# Patient Record
Sex: Female | Born: 1935
Health system: Southern US, Community
[De-identification: ages and names within clinical notes are randomized; demographics above are authoritative.]

## PROBLEM LIST (undated history)

## (undated) DIAGNOSIS — K2100 Gastro-esophageal reflux disease with esophagitis, without bleeding: Secondary | ICD-10-CM

## (undated) DIAGNOSIS — E785 Hyperlipidemia, unspecified: Secondary | ICD-10-CM

## (undated) DIAGNOSIS — E1165 Type 2 diabetes mellitus with hyperglycemia: Secondary | ICD-10-CM

## (undated) DIAGNOSIS — H3530 Unspecified macular degeneration: Secondary | ICD-10-CM

## (undated) DIAGNOSIS — K219 Gastro-esophageal reflux disease without esophagitis: Secondary | ICD-10-CM

## (undated) DIAGNOSIS — I10 Essential (primary) hypertension: Secondary | ICD-10-CM

## (undated) DIAGNOSIS — M2550 Pain in unspecified joint: Secondary | ICD-10-CM

## (undated) DIAGNOSIS — L259 Unspecified contact dermatitis, unspecified cause: Secondary | ICD-10-CM

## (undated) DIAGNOSIS — R413 Other amnesia: Secondary | ICD-10-CM

## (undated) DIAGNOSIS — K589 Irritable bowel syndrome without diarrhea: Secondary | ICD-10-CM

## (undated) DIAGNOSIS — R5383 Other fatigue: Secondary | ICD-10-CM

## (undated) DIAGNOSIS — M8448XA Pathological fracture, other site, initial encounter for fracture: Secondary | ICD-10-CM

## (undated) DIAGNOSIS — H16229 Keratoconjunctivitis sicca, not specified as Sjogren's, unspecified eye: Secondary | ICD-10-CM

## (undated) DIAGNOSIS — E213 Hyperparathyroidism, unspecified: Secondary | ICD-10-CM

## (undated) DIAGNOSIS — M4120 Other idiopathic scoliosis, site unspecified: Secondary | ICD-10-CM

## (undated) DIAGNOSIS — R634 Abnormal weight loss: Secondary | ICD-10-CM

## (undated) DIAGNOSIS — G609 Hereditary and idiopathic neuropathy, unspecified: Secondary | ICD-10-CM

## (undated) DIAGNOSIS — H25019 Cortical age-related cataract, unspecified eye: Secondary | ICD-10-CM

## (undated) DIAGNOSIS — R5381 Other malaise: Secondary | ICD-10-CM

## (undated) DIAGNOSIS — M810 Age-related osteoporosis without current pathological fracture: Secondary | ICD-10-CM

## (undated) DIAGNOSIS — IMO0001 Reserved for inherently not codable concepts without codable children: Secondary | ICD-10-CM

## (undated) DIAGNOSIS — E559 Vitamin D deficiency, unspecified: Secondary | ICD-10-CM

## (undated) HISTORY — DX: Keratoconjunctivitis sicca, not specified as Sjogren's, unspecified eye: H16.229

## (undated) HISTORY — DX: Age-related osteoporosis without current pathological fracture: M81.0

## (undated) HISTORY — DX: Hyperlipidemia, unspecified: E78.5

## (undated) HISTORY — DX: Reserved for inherently not codable concepts without codable children: IMO0001

## (undated) HISTORY — DX: Type 2 diabetes mellitus with hyperglycemia: E11.65

## (undated) HISTORY — DX: Other fatigue: R53.83

## (undated) HISTORY — DX: Unspecified macular degeneration: H35.30

## (undated) HISTORY — DX: Vitamin D deficiency, unspecified: E55.9

## (undated) HISTORY — DX: Unspecified contact dermatitis, unspecified cause: L25.9

## (undated) HISTORY — DX: Irritable bowel syndrome, unspecified: K58.9

## (undated) HISTORY — DX: Pain in unspecified joint: M25.50

## (undated) HISTORY — DX: Other amnesia: R41.3

## (undated) HISTORY — PX: SPINE SURGERY: SHX786

## (undated) HISTORY — DX: Essential (primary) hypertension: I10

## (undated) HISTORY — DX: Gastro-esophageal reflux disease with esophagitis: K21.0

## (undated) HISTORY — DX: Gastro-esophageal reflux disease with esophagitis, without bleeding: K21.00

## (undated) HISTORY — DX: Other malaise: R53.81

## (undated) HISTORY — DX: Hyperparathyroidism, unspecified: E21.3

## (undated) HISTORY — DX: Other idiopathic scoliosis, site unspecified: M41.20

## (undated) HISTORY — DX: Hereditary and idiopathic neuropathy, unspecified: G60.9

## (undated) HISTORY — PX: APPENDECTOMY: SHX54

## (undated) HISTORY — DX: Pathological fracture, other site, initial encounter for fracture: M84.48XA

## (undated) HISTORY — DX: Abnormal weight loss: R63.4

## (undated) HISTORY — DX: Cortical age-related cataract, unspecified eye: H25.019

## (undated) HISTORY — DX: Gastro-esophageal reflux disease without esophagitis: K21.9

---
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
## 1976-06-30 HISTORY — PX: HEMORRHOID SURGERY: SHX153

## 1998-06-08 ENCOUNTER — Other Ambulatory Visit: Admission: RE | Admit: 1998-06-08 | Discharge: 1998-06-08 | Payer: Self-pay | Admitting: Obstetrics and Gynecology

## 1999-03-01 ENCOUNTER — Encounter: Payer: Self-pay | Admitting: Endocrinology

## 1999-03-01 ENCOUNTER — Ambulatory Visit (HOSPITAL_COMMUNITY): Admission: RE | Admit: 1999-03-01 | Discharge: 1999-03-01 | Payer: Self-pay | Admitting: Endocrinology

## 1999-06-03 ENCOUNTER — Other Ambulatory Visit: Admission: RE | Admit: 1999-06-03 | Discharge: 1999-06-03 | Payer: Self-pay | Admitting: Obstetrics and Gynecology

## 1999-06-03 ENCOUNTER — Encounter: Admission: RE | Admit: 1999-06-03 | Discharge: 1999-06-03 | Payer: Self-pay | Admitting: Obstetrics and Gynecology

## 1999-06-03 ENCOUNTER — Encounter: Payer: Self-pay | Admitting: Obstetrics and Gynecology

## 1999-07-01 HISTORY — PX: COLONOSCOPY: SHX174

## 1999-07-19 ENCOUNTER — Other Ambulatory Visit: Admission: RE | Admit: 1999-07-19 | Discharge: 1999-07-19 | Payer: Self-pay | Admitting: Obstetrics and Gynecology

## 1999-08-05 ENCOUNTER — Encounter: Admission: RE | Admit: 1999-08-05 | Discharge: 1999-11-03 | Payer: Self-pay | Admitting: Endocrinology

## 1999-08-08 ENCOUNTER — Encounter: Payer: Self-pay | Admitting: *Deleted

## 1999-08-08 ENCOUNTER — Ambulatory Visit (HOSPITAL_COMMUNITY): Admission: RE | Admit: 1999-08-08 | Discharge: 1999-08-08 | Payer: Self-pay | Admitting: *Deleted

## 1999-11-21 ENCOUNTER — Encounter: Payer: Self-pay | Admitting: Endocrinology

## 1999-11-21 ENCOUNTER — Encounter: Admission: RE | Admit: 1999-11-21 | Discharge: 1999-11-21 | Payer: Self-pay | Admitting: Endocrinology

## 2000-04-29 ENCOUNTER — Ambulatory Visit (HOSPITAL_COMMUNITY): Admission: RE | Admit: 2000-04-29 | Discharge: 2000-04-29 | Payer: Self-pay | Admitting: Gastroenterology

## 2000-06-01 ENCOUNTER — Other Ambulatory Visit: Admission: RE | Admit: 2000-06-01 | Discharge: 2000-06-01 | Payer: Self-pay | Admitting: Obstetrics and Gynecology

## 2000-11-23 ENCOUNTER — Encounter: Payer: Self-pay | Admitting: Internal Medicine

## 2000-11-23 ENCOUNTER — Encounter: Admission: RE | Admit: 2000-11-23 | Discharge: 2000-11-23 | Payer: Self-pay | Admitting: Internal Medicine

## 2001-06-08 ENCOUNTER — Other Ambulatory Visit: Admission: RE | Admit: 2001-06-08 | Discharge: 2001-06-08 | Payer: Self-pay | Admitting: Obstetrics and Gynecology

## 2001-09-08 ENCOUNTER — Encounter: Admission: RE | Admit: 2001-09-08 | Discharge: 2001-09-08 | Payer: Self-pay | Admitting: *Deleted

## 2001-10-27 ENCOUNTER — Encounter: Payer: Self-pay | Admitting: Neurosurgery

## 2001-11-02 ENCOUNTER — Inpatient Hospital Stay (HOSPITAL_COMMUNITY): Admission: RE | Admit: 2001-11-02 | Discharge: 2001-11-05 | Payer: Self-pay | Admitting: Neurosurgery

## 2001-11-02 ENCOUNTER — Encounter: Payer: Self-pay | Admitting: Neurosurgery

## 2001-11-02 HISTORY — PX: SPINE SURGERY: SHX786

## 2001-12-07 ENCOUNTER — Encounter: Payer: Self-pay | Admitting: Internal Medicine

## 2001-12-07 ENCOUNTER — Encounter: Admission: RE | Admit: 2001-12-07 | Discharge: 2001-12-07 | Payer: Self-pay | Admitting: Internal Medicine

## 2002-03-08 ENCOUNTER — Ambulatory Visit (HOSPITAL_COMMUNITY): Admission: RE | Admit: 2002-03-08 | Discharge: 2002-03-08 | Payer: Self-pay | Admitting: Neurosurgery

## 2002-03-08 ENCOUNTER — Encounter: Payer: Self-pay | Admitting: Neurosurgery

## 2002-06-09 ENCOUNTER — Other Ambulatory Visit: Admission: RE | Admit: 2002-06-09 | Discharge: 2002-06-09 | Payer: Self-pay | Admitting: Obstetrics and Gynecology

## 2002-06-30 HISTORY — PX: MEDIAL PARTIAL KNEE REPLACEMENT: SHX5965

## 2002-06-30 HISTORY — PX: CHOLECYSTECTOMY: SHX55

## 2002-12-09 ENCOUNTER — Encounter: Payer: Self-pay | Admitting: Internal Medicine

## 2002-12-09 ENCOUNTER — Encounter: Admission: RE | Admit: 2002-12-09 | Discharge: 2002-12-09 | Payer: Self-pay | Admitting: Internal Medicine

## 2003-03-15 ENCOUNTER — Encounter: Admission: RE | Admit: 2003-03-15 | Discharge: 2003-03-15 | Payer: Self-pay | Admitting: Internal Medicine

## 2003-03-15 ENCOUNTER — Encounter: Payer: Self-pay | Admitting: Internal Medicine

## 2003-03-23 ENCOUNTER — Encounter: Payer: Self-pay | Admitting: Internal Medicine

## 2003-03-23 ENCOUNTER — Encounter: Admission: RE | Admit: 2003-03-23 | Discharge: 2003-03-23 | Payer: Self-pay | Admitting: Internal Medicine

## 2003-04-24 ENCOUNTER — Encounter: Payer: Self-pay | Admitting: General Surgery

## 2003-04-26 ENCOUNTER — Encounter: Admission: RE | Admit: 2003-04-26 | Discharge: 2003-04-26 | Payer: Self-pay | Admitting: General Surgery

## 2003-04-27 ENCOUNTER — Encounter (INDEPENDENT_AMBULATORY_CARE_PROVIDER_SITE_OTHER): Payer: Self-pay | Admitting: *Deleted

## 2003-04-27 ENCOUNTER — Ambulatory Visit (HOSPITAL_COMMUNITY): Admission: RE | Admit: 2003-04-27 | Discharge: 2003-04-28 | Payer: Self-pay | Admitting: General Surgery

## 2003-05-15 ENCOUNTER — Inpatient Hospital Stay (HOSPITAL_COMMUNITY): Admission: RE | Admit: 2003-05-15 | Discharge: 2003-05-18 | Payer: Self-pay | Admitting: Orthopedic Surgery

## 2003-05-18 ENCOUNTER — Inpatient Hospital Stay (HOSPITAL_COMMUNITY)
Admission: RE | Admit: 2003-05-18 | Discharge: 2003-05-24 | Payer: Self-pay | Admitting: Physical Medicine & Rehabilitation

## 2003-06-09 ENCOUNTER — Other Ambulatory Visit: Admission: RE | Admit: 2003-06-09 | Discharge: 2003-06-09 | Payer: Self-pay | Admitting: Family Medicine

## 2003-07-03 ENCOUNTER — Encounter: Admission: RE | Admit: 2003-07-03 | Discharge: 2003-07-03 | Payer: Self-pay | Admitting: Family Medicine

## 2003-09-20 ENCOUNTER — Other Ambulatory Visit: Admission: RE | Admit: 2003-09-20 | Discharge: 2003-09-20 | Payer: Self-pay | Admitting: Diagnostic Radiology

## 2003-12-12 ENCOUNTER — Encounter: Admission: RE | Admit: 2003-12-12 | Discharge: 2003-12-12 | Payer: Self-pay | Admitting: Family Medicine

## 2004-11-06 ENCOUNTER — Encounter (HOSPITAL_COMMUNITY): Admission: RE | Admit: 2004-11-06 | Discharge: 2005-02-04 | Payer: Self-pay | Admitting: Surgery

## 2004-12-06 ENCOUNTER — Encounter: Admission: RE | Admit: 2004-12-06 | Discharge: 2004-12-06 | Payer: Self-pay | Admitting: Family Medicine

## 2004-12-17 ENCOUNTER — Encounter: Admission: RE | Admit: 2004-12-17 | Discharge: 2004-12-17 | Payer: Self-pay | Admitting: Family Medicine

## 2005-06-30 HISTORY — PX: THYROIDECTOMY, PARTIAL: SHX18

## 2005-12-02 ENCOUNTER — Ambulatory Visit (HOSPITAL_COMMUNITY): Admission: RE | Admit: 2005-12-02 | Discharge: 2005-12-02 | Payer: Self-pay | Admitting: Surgery

## 2005-12-16 ENCOUNTER — Other Ambulatory Visit: Admission: RE | Admit: 2005-12-16 | Discharge: 2005-12-16 | Payer: Self-pay | Admitting: Obstetrics & Gynecology

## 2005-12-18 ENCOUNTER — Encounter: Admission: RE | Admit: 2005-12-18 | Discharge: 2005-12-18 | Payer: Self-pay | Admitting: Family Medicine

## 2006-02-27 ENCOUNTER — Ambulatory Visit (HOSPITAL_COMMUNITY): Admission: RE | Admit: 2006-02-27 | Discharge: 2006-02-28 | Payer: Self-pay | Admitting: Surgery

## 2006-02-27 ENCOUNTER — Encounter (INDEPENDENT_AMBULATORY_CARE_PROVIDER_SITE_OTHER): Payer: Self-pay | Admitting: *Deleted

## 2006-10-05 ENCOUNTER — Encounter: Admission: RE | Admit: 2006-10-05 | Discharge: 2006-10-05 | Payer: Self-pay | Admitting: Family Medicine

## 2007-01-13 ENCOUNTER — Encounter: Admission: RE | Admit: 2007-01-13 | Discharge: 2007-01-13 | Payer: Self-pay | Admitting: Family Medicine

## 2007-07-01 HISTORY — PX: TOTAL KNEE ARTHROPLASTY: SHX125

## 2007-12-30 ENCOUNTER — Other Ambulatory Visit: Admission: RE | Admit: 2007-12-30 | Discharge: 2007-12-30 | Payer: Self-pay | Admitting: Obstetrics & Gynecology

## 2008-01-20 ENCOUNTER — Encounter: Admission: RE | Admit: 2008-01-20 | Discharge: 2008-01-20 | Payer: Self-pay | Admitting: Family Medicine

## 2008-01-25 ENCOUNTER — Inpatient Hospital Stay (HOSPITAL_COMMUNITY): Admission: RE | Admit: 2008-01-25 | Discharge: 2008-01-28 | Payer: Self-pay | Admitting: Orthopedic Surgery

## 2008-03-13 ENCOUNTER — Encounter: Admission: RE | Admit: 2008-03-13 | Discharge: 2008-03-13 | Payer: Self-pay | Admitting: Neurosurgery

## 2008-06-30 HISTORY — PX: KYPHOPLASTY: SHX5884

## 2008-07-17 ENCOUNTER — Other Ambulatory Visit: Payer: Self-pay | Admitting: Neurosurgery

## 2008-07-20 ENCOUNTER — Other Ambulatory Visit: Payer: Self-pay | Admitting: Neurosurgery

## 2008-07-24 ENCOUNTER — Other Ambulatory Visit: Payer: Self-pay | Admitting: Neurosurgery

## 2008-07-24 ENCOUNTER — Observation Stay (HOSPITAL_COMMUNITY): Admission: RE | Admit: 2008-07-24 | Discharge: 2008-07-25 | Payer: Self-pay | Admitting: Neurosurgery

## 2009-01-23 ENCOUNTER — Encounter: Admission: RE | Admit: 2009-01-23 | Discharge: 2009-01-23 | Payer: Self-pay | Admitting: Family Medicine

## 2010-01-15 ENCOUNTER — Encounter: Admission: RE | Admit: 2010-01-15 | Discharge: 2010-01-15 | Payer: Self-pay | Admitting: Family Medicine

## 2010-01-17 ENCOUNTER — Encounter: Admission: RE | Admit: 2010-01-17 | Discharge: 2010-01-17 | Payer: Self-pay | Admitting: Family Medicine

## 2010-01-23 ENCOUNTER — Encounter: Admission: RE | Admit: 2010-01-23 | Discharge: 2010-01-23 | Payer: Self-pay | Admitting: Obstetrics & Gynecology

## 2010-02-04 ENCOUNTER — Encounter: Admission: RE | Admit: 2010-02-04 | Discharge: 2010-02-04 | Payer: Self-pay | Admitting: Surgery

## 2010-02-05 ENCOUNTER — Ambulatory Visit (HOSPITAL_BASED_OUTPATIENT_CLINIC_OR_DEPARTMENT_OTHER): Admission: RE | Admit: 2010-02-05 | Discharge: 2010-02-05 | Payer: Self-pay | Admitting: Surgery

## 2010-02-05 ENCOUNTER — Encounter: Admission: RE | Admit: 2010-02-05 | Discharge: 2010-02-05 | Payer: Self-pay | Admitting: Surgery

## 2010-02-05 HISTORY — PX: BREAST LUMPECTOMY: SHX2

## 2010-03-26 DIAGNOSIS — E213 Hyperparathyroidism, unspecified: Secondary | ICD-10-CM

## 2010-03-26 HISTORY — DX: Hyperparathyroidism, unspecified: E21.3

## 2010-06-17 ENCOUNTER — Encounter
Admission: RE | Admit: 2010-06-17 | Discharge: 2010-06-17 | Payer: Self-pay | Source: Home / Self Care | Attending: Internal Medicine | Admitting: Internal Medicine

## 2010-06-17 DIAGNOSIS — R5381 Other malaise: Secondary | ICD-10-CM

## 2010-06-17 HISTORY — DX: Other malaise: R53.81

## 2010-09-13 LAB — DIFFERENTIAL
Basophils Absolute: 0 10*3/uL (ref 0.0–0.1)
Basophils Relative: 0 % (ref 0–1)
Eosinophils Absolute: 0.2 10*3/uL (ref 0.0–0.7)
Eosinophils Relative: 2 % (ref 0–5)
Lymphocytes Relative: 27 % (ref 12–46)
Lymphs Abs: 2.3 10*3/uL (ref 0.7–4.0)
Monocytes Absolute: 0.7 10*3/uL (ref 0.1–1.0)
Monocytes Relative: 8 % (ref 3–12)
Neutro Abs: 5.3 10*3/uL (ref 1.7–7.7)
Neutrophils Relative %: 63 % (ref 43–77)

## 2010-09-13 LAB — BASIC METABOLIC PANEL
BUN: 17 mg/dL (ref 6–23)
CO2: 26 mEq/L (ref 19–32)
Calcium: 11.2 mg/dL — ABNORMAL HIGH (ref 8.4–10.5)
Chloride: 103 mEq/L (ref 96–112)
Creatinine, Ser: 0.76 mg/dL (ref 0.4–1.2)
GFR calc Af Amer: >60 mL/min (ref >60)
GFR calc non Af Amer: >60 mL/min (ref >60)
Glucose, Bld: 193 mg/dL — ABNORMAL HIGH (ref 70–99)
Potassium: 4 mEq/L (ref 3.5–5.1)
Sodium: 138 mEq/L (ref 135–145)

## 2010-09-13 LAB — CBC
HCT: 38.5 % (ref 36.0–46.0)
Hemoglobin: 12.9 g/dL (ref 12.0–15.0)
MCH: 31.2 pg (ref 26.0–34.0)
MCHC: 33.5 g/dL (ref 30.0–36.0)
MCV: 93 fL (ref 78.0–100.0)
Platelets: 230 10*3/uL (ref 150–400)
RBC: 4.14 MIL/uL (ref 3.87–5.11)
RDW: 13.7 % (ref 11.5–15.5)
WBC: 8.4 10*3/uL (ref 4.0–10.5)

## 2010-09-13 LAB — GLUCOSE, CAPILLARY: Glucose-Capillary: 115 mg/dL — ABNORMAL HIGH (ref 70–99)

## 2010-10-14 LAB — CBC
HCT: 39.1 % (ref 36.0–46.0)
HCT: 39.3 % (ref 36.0–46.0)
Hemoglobin: 13 g/dL (ref 12.0–15.0)
Hemoglobin: 13.1 g/dL (ref 12.0–15.0)
MCHC: 33.1 g/dL (ref 30.0–36.0)
MCHC: 33.4 g/dL (ref 30.0–36.0)
MCV: 93.4 fL (ref 78.0–100.0)
MCV: 93.9 fL (ref 78.0–100.0)
Platelets: 249 10*3/uL (ref 150–400)
Platelets: 250 10*3/uL (ref 150–400)
RBC: 4.19 MIL/uL (ref 3.87–5.11)
RBC: 4.19 MIL/uL (ref 3.87–5.11)
RDW: 14.4 % (ref 11.5–15.5)
RDW: 14.8 % (ref 11.5–15.5)
WBC: 6.4 10*3/uL (ref 4.0–10.5)
WBC: 7.1 10*3/uL (ref 4.0–10.5)

## 2010-10-14 LAB — BASIC METABOLIC PANEL
BUN: 16 mg/dL (ref 6–23)
BUN: 16 mg/dL (ref 6–23)
CO2: 26 mEq/L (ref 19–32)
CO2: 29 mEq/L (ref 19–32)
Calcium: 11 mg/dL — ABNORMAL HIGH (ref 8.4–10.5)
Calcium: 11 mg/dL — ABNORMAL HIGH (ref 8.4–10.5)
Chloride: 100 mEq/L (ref 96–112)
Chloride: 102 mEq/L (ref 96–112)
Creatinine, Ser: 0.65 mg/dL (ref 0.4–1.2)
Creatinine, Ser: 0.65 mg/dL (ref 0.4–1.2)
GFR calc Af Amer: >60 mL/min (ref >60)
GFR calc Af Amer: >60 mL/min (ref >60)
GFR calc non Af Amer: >60 mL/min (ref >60)
GFR calc non Af Amer: >60 mL/min (ref >60)
Glucose, Bld: 129 mg/dL — ABNORMAL HIGH (ref 70–99)
Glucose, Bld: 150 mg/dL — ABNORMAL HIGH (ref 70–99)
Potassium: 4.1 mEq/L (ref 3.5–5.1)
Potassium: 4.5 mEq/L (ref 3.5–5.1)
Sodium: 135 mEq/L (ref 135–145)
Sodium: 135 mEq/L (ref 135–145)

## 2010-10-14 LAB — GLUCOSE, CAPILLARY
Glucose-Capillary: 104 mg/dL — ABNORMAL HIGH (ref 70–99)
Glucose-Capillary: 119 mg/dL — ABNORMAL HIGH (ref 70–99)
Glucose-Capillary: 140 mg/dL — ABNORMAL HIGH (ref 70–99)
Glucose-Capillary: 152 mg/dL — ABNORMAL HIGH (ref 70–99)
Glucose-Capillary: 172 mg/dL — ABNORMAL HIGH (ref 70–99)
Glucose-Capillary: 72 mg/dL (ref 70–99)

## 2010-11-12 NOTE — Op Note (Signed)
NAMESHAINDEL, SWEETEN                ACCOUNT NO.:  [id number]   MEDICAL RECORD NO.:  [id number]          PATIENT TYPE:  INP   LOCATION:  0008                         FACILITY:  [facility]   PHYSICIAN:  Madlyn Frankel. Charlann Boxer, M.D.  DATE OF BIRTH:  1925/08/18   DATE OF PROCEDURE:  01/25/2008  DATE OF DISCHARGE:                               OPERATIVE REPORT   PREOPERATIVE DIAGNOSIS:  Right knee osteoarthritis.   POSTOPERATIVE DIAGNOSIS:  Right knee osteoarthritis.   PROCEDURE:  Right total knee replacement.   COMPONENTS USED:  Rotating platform posterior stabilized knee system,  2.5 femur, 2.5 tibia, 10 mm insert and 35 patella button.   SURGEON:  Madlyn Frankel. Charlann Boxer, M.D.   ASSISTANT:  Surgical tech.   ANESTHESIA:  Duramorph spinal.   BLOOD LOSS:  Minimal.   TOURNIQUET TIME:  40 minutes at 250 mmHg.   DRAIN:  One.   INDICATIONS FOR PROCEDURE:  Ms. Hamelin is an 77 year old female self-  referred for evaluation of right knee anterolateral left uni performed  in the past.  She did have progressive discomfort with bone on bone  articulation on the lateral compartment of the knee.  Given the fact  that she failed conservative measures, she wished to go ahead and  proceed with arthroplasty versus unicompartmental.  Risks and benefits  were discussed including potential displeasure of the procedure,  infection, DVT, failure and need for revision.  Hopefully at age 85, we  will get her knee that she will enjoy.  We discussed the postoperative  course, expectations of therapy required, consent was obtained.   DESCRIPTION OF PROCEDURE:  Patient was brought to the operating theater.  Once adequate anesthesia and preoperative antibiotics, Ancef,  administered, patient was positioned supine with a thigh tourniquet  placed.  Right lower extremity was prescrubbed, prepped and draped in  sterile fashion.  The leg was exsanguinated, tourniquet elevated to 250  mmHg.  Midline incision was made followed  by a median arthrotomy.  Following initial debridement, attention was directed to the patella.  Precut measurement was 22 mm, I then resected to 14 mm and chose a 35  patella button.  I placed the metal shim to protect the cut surface.  It  was immediately evident on this first cut that her bone was very  osteoporotic.   Following further debridement, attention was directed to the femur.  Femoral canal was opened and drilled. irrigated to prevent fat emboli.  I placed an intramedullary rod and due to her short stature, I chose 3  degree valgus cut of 10 mm of bone off the distal femur. Despite the  valgus nature of her knee, no augements were necessary.   I sized the femur to be a size 2.5.  At this point, I attended to the  tibia and subluxated anteriorly.  Following exposure and meniscectomy, I  used an extramedullary guide and dissected 4 mm of bone off the medial  side of the tibia.  I checked with the  extension gap and found that the  knee came under full extension.   I checked the cut  surface and found that the cut was perpendicular in  both planes.   At this point, we checked the rotation of the femoral component using  the cut surface of the proximal tibia.  Once the rotation was set and  the pins were in place, I used a 2.5 4-in-1 cutting block and positioned  it and found that it was perpendicular to Whiteside's line.   These four cuts were made followed by the lateral box cut off the distal  femur.  I now returned to the tibia, performing further debridements as  necessary.  The 2.5 tibial probe was sat onto the cut surface of the  proximal tibial.  The tray was pinned in position, drilled and keel  punched.  Trial reduction was carried out with the 2.5 femur, 2.5 tibia  and 10 mm insert.  The knee came out to full extension.  The ligaments  appeared to be very stable in extension through flexion.  No evidence of  any significant laxity medially and the lateral appeared  to be released  adequately.  Patella tracked without application of thumb pressure.   At this point, all trial components were removed.  We injected the  synovial capsule layer with 2 mL of 25% Marcaine with epinephrine and 1  mL of Toradol.  The knee was irrigated with pulse lavage.  Cement was  mixed as final components were opened.  Components were cemented into  position and the knee was brought out to extension with a 10 mm insert.  Extruded cement was removed.  Once the cement cured, excessive cement  was removed throughout the knee.  Satisfied I was unable to visualize  any other cement pieces, I placed a final 10 mm poly to match the 2.5  femur.  The knee was reirrigated with normal saline solution, the  tourniquet was let down at 40 minutes.  A medium Hemovac drain was  placed deep.  The extensor mechanism was then reapproximated using #1  Vicryl with the knee in flexion.  The remainder of the wound with 2-0  Vicryl and running 4-0 Monocryl.  The knee was cleaned, dried and  dressed sterilely with Steri-Strips and sterile bulky wrap.  She was  brought to the recovery room in stable condition.  Tolerated the  procedure well.      Madlyn Frankel Charlann Boxer, M.D.  Electronically Signed     MDO/MEDQ  D:  01/25/2008  T:  01/25/2008  Job:  [id number]

## 2010-11-12 NOTE — Op Note (Signed)
NAMEMALIE, KASHANI                ACCOUNT NO.:  [id number]   MEDICAL RECORD NO.:  [id number]          PATIENT TYPE:  INP   LOCATION:  3533                         FACILITY:  [facility]   PHYSICIAN:  Danae Orleans. Venetia Maxon, M.D.  DATE OF BIRTH:  [birth year]   DATE OF PROCEDURE:  07/24/2008  DATE OF DISCHARGE:                               OPERATIVE REPORT   PREOPERATIVE DIAGNOSIS:  T10 and T12 compression fractures.   POSTOPERATIVE DIAGNOSIS:  T10 and T12 compression fractures.   PROCEDURE:  T10 and T12 kyphoplasty.   SURGEON:  Danae Orleans. Venetia Maxon, MD   ANESTHESIA:  General endotracheal anesthesia.   ESTIMATED BLOOD LOSS:  Minimal.   COMPLICATIONS:  None.   DISPOSITION:  Recovery.   INDICATIONS:  Ms. Mountjoy is an 77 year old  woman with T10 and T12  compression fractures with osteoporosis.  It was elected after  protracted conservative management without resolution of her pain for  her to undergo T10 and T12 kyphoplasty procedures.  She has significant  scoliosis as well as degenerative spinal spondylosis.   PROCEDURE:  Ms. Laguna was brought to the operating room.  Following a  satisfactory and uncomplicated induction of general endotracheal  anesthesia and placement of intravenous lines, the patient was turned in  prone position on fluoroscopic table with chest and pelvic rolls.  Her  back was prepped and draped in usual sterile fashion with a DuraPrep  after AP and lateral C-arm fluoroscopy was obtained defining the T10 and  T12 vertebral bodies and maintaining spinous processes in the midline.  Initially on the left of T12, a transpedicular approach was performed  with an introducer after infiltrating skin and subcutaneous tissues with  local lidocaine.  A 20 mL balloon was filled but did not fill across the  midline.  It was, therefore, elected to place an additional balloon on  the right side and with bilateral filling, there was good restoration of  vertebral body height and of  the superior endplate of T12.  Attention  was then turned to the T10 level where from the left transpedicular  approach an inflatable bone tamp was inserted and with good filling  across the midline, the bone cement was then mixed and using C-arm AP  and lateral fluoroscopy, 3 fills were placed at the T12 level and 3  fills of the T10 level with good filling across the vertebra and  without evidence of extravasation.  The introducers were removed.  The  incisions were closed with 3-0 Vicryl stitches.  Wounds were dressed  with Dermabond.  The patient was extubated in the operating room, taken  to the recovery in stable satisfactory condition, having tolerated the  operation well.  Counts correct at the end of the case.      Danae Orleans. Venetia Maxon, M.D.  Electronically Signed     JDS/MEDQ  D:  07/24/2008  T:  07/25/2008  Job:  [id number]

## 2010-11-12 NOTE — H&P (Signed)
[name], [name]                ACCOUNT NO.:  [id number]   MEDICAL RECORD NO.:  [id number]          PATIENT TYPE:  INP   LOCATION:                               FACILITY:  [facility]   PHYSICIAN:  Madlyn Frankel. Charlann Boxer, M.D.  DATE OF BIRTH:  09-22-1925   DATE OF ADMISSION:  01/25/2008  DATE OF DISCHARGE:                              HISTORY & PHYSICAL   PROCEDURE:  Right total knee arthroplasty.   CHIEF COMPLAINTS:  Right knee pain.   HISTORY OF PRESENT ILLNESS:  An 77 year old female with a history of  right knee pain secondary to osteoarthritis.  It has been refractory to  all conservative treatment, including oral antiinflammatories and  cortisone injection.  She also has a significant history of a left  lateral partial knee replacement done back in 2004.  This has given her  some persistent problems.  She has been very motivated for this right  total knee replacement which is pending, and she has even done preop  physical therapy to help with strengthening and stretching exercises.   PAST MEDICAL HISTORY:  1. Osteoarthritis.  2. Diabetes.  3. Hypertension.   PAST SURGICAL HISTORY:  1. Fibroidectomy in the 50s.  2. Cholecystectomy.  3. Left partial knee replacement.  4. Partial thyroidectomy.  5. Hemorrhoidectomy.   FAMILY HISTORY:  Coronary artery disease, stroke.   SOCIAL HISTORY:  Widowed.  She is retired.  She is planning on a skilled  nursing facility rehab hopefully at [facility] postoperatively.   DRUG ALLERGIES:  1. SULFA DRUGS.  2. She is also allergic to NUTMEG.   CURRENT MEDICATIONS:  1. Actos 15 mg p.o. daily.  2. Benazepril 10 mg p.o. daily.  3. Glipizide ER 5 mg p.o. daily.  4. Fish oil daily.  5. Multivitamin daily.  6. Vitamin D 1000 units p.o. daily.   REVIEW OF SYSTEMS:  MUSCULOSKELETAL:  She does have back pain and  morning stiffness of her back.  Otherwise, see HPI.   PHYSICAL EXAMINATION:  VITAL SIGNS:  Pulse 72, respirations 16, blood  pressure  122/66.  Height 5 feet 4 inches, 122 pounds.  GENERAL:  Awake, alert, and oriented, well developed, well nourished, in  no acute distress.  NECK:  Supple.  No carotid bruits.  CHEST:  The lungs are clear to auscultation bilaterally.  BREASTS:  Deferred.  HEART:  Regular rate and rhythm.  S1-S2 distinct.  ABDOMEN:  Soft, nontender, bowel sounds present.  GENITOURINARY:  Deferred.  EXTREMITIES:  Right knee has crepitation with range of motion.  She can  flex fully.  SKIN:  No signs of cellulitis.  NEUROLOGIC:  Intact distal sensibilities.   Labs, EKG, and chest x-ray all pending presurgical testing.   IMPRESSION:  Right knee osteoarthritis.   PLAN OF ACTION:  Right total knee arthroplasty by surgeon Dr. Durene Romans at [facility], January 25, 2008.  Risks and complications  were discussed.   The patient is planning skilled nursing facility rehab.  Would prefer  [facility] if at all possible.     ______________________________  [name]. [name]  Madlyn Frankel Charlann Boxer, M.D.  Electronically Signed    BLM/MEDQ  D:  01/12/2008  T:  01/12/2008  Job:  [id number]   cc:   Talmadge Coventry, M.D.  Fax: [phone]   Dorisann Frames, M.D.   Dorisann Frames, M.D.

## 2010-11-12 NOTE — Discharge Summary (Signed)
[name], [name]                ACCOUNT NO.:  [id number]   MEDICAL RECORD NO.:  [id number]          PATIENT TYPE:  INP   LOCATION:  1603                         FACILITY:  [facility]   PHYSICIAN:  Madlyn Frankel. Charlann Boxer, M.D.  DATE OF BIRTH:  02-14-1926   DATE OF ADMISSION:  01/25/2008  DATE OF DISCHARGE:  01/28/2008                               DISCHARGE SUMMARY   PRINCIPAL DIAGNOSIS:  Right knee osteoarthritis.   SECONDARY DIAGNOSES:  1. Include osteoarthritis.  2. Diabetes.  3. Hypertension.  4. History of a left partial knee replacement.   BRIEF HISTORY:  The patient is an 77 year old female who presented to  the office for evaluation of advancing right knee osteoarthritis  predominately in the lateral and anterior aspects of the knee.  She did  not wish to proceed with a partial knee replacement.  Instead, wished to  proceed with total knee replacement.  Risks and benefits were discussed.  Surgery was set up for same-day surgery on January 25, 2008.  Please see  the history and physical for details of her medical history, past  surgical history, family and social history.   CURRENT MEDICATIONS:  Also listed in that area.   HOSPITAL COURSE:  The patient was set up for same-day surgery on January 25, 2008.  She underwent, at that time, an uncomplicated right total  knee replacement.  Please see dictated operative note for details of the  operative procedure including operative time, tourniquet time, and  components used.  She was transferred to the orthopedic ward  postoperatively after this knee replacement surgery from the recovery  room without difficulty or issue.   Her basic hospital course was entirely uncomplicated.  She did extremely  well while she was in the hospital without any events.  She did not  require transfusion.  Her last hematocrit was 25.5 noted on postop day  #2.  She is currently on iron.  She was not weak, did not have any dizzy  spells, and had no tachycardia  or chest pains.   She was seen and evaluated by physical therapy and worked with CPM while  in the hospital to improve her overall range of motion strength.   By the time of discharge, she was still working with therapy and using a  knee immobilizer.  She was unable to perform straight leg raising due to  quad weakness.   DISCHARGE INSTRUCTIONS:  She is to be discharged on January 28, 2008 to a  skilled nursing facility, to do rehab once to twice a day.  She is  instructed to wear a knee immobilizer only as needed for weakness in the  leg with walking.  Otherwise, she should use a walker for balance.  She  should work on strengthening and extension of her right lower extremity  as well as the motion.   WOUND EVALUATION:  She should have a dry dressing on her right knee for  2 weeks and her Steri-Strips can room be removed in 2 weeks.  She can  shower, just keep her wound as dry as possible for  the next 7 days.   DISCHARGE FOLLOWUP:  She will return to see [place] at [facility]  at [phone] in 2 weeks.   DISCHARGE MEDICATIONS:  1. Will include her home medicine of Actos 15 mg p.o. daily.  2. Benazepril 10 mg p.o. daily.  3. Glipizide ER 5 mg daily.  4. Fish oil.  5. Multivitamins.  6. Vitamin D 1000 units daily.  7. In addition, she will utilize Norco 5 mg 7.5 mg tablets 1 to 2      tablets p.o. q.4 to 6 hours p.o. for pain.  8. Robaxin 500 mg p.o. q.6 hours p.r.n. pain and muscle spasms.  9. Reglan 10 mg p.o. q.8 hours p.r.n. nausea.  10.She will take the Lovenox injections 40 mg subcu within her abdomen      for the next 11 days.  This should stop on January 29, 2008.  11.After that she will start on aspirin 325 mg p.o. daily.  12.Artificial tears can be used for dry eyes.   DISCHARGE DIAGNOSIS:  1. Right knee osteoarthritis.  2. History of osteoarthritis.  3. History of diabetes.  4. History of hypertension.  5. History of osteopenia.      Madlyn Frankel Charlann Boxer,  M.D.  Electronically Signed     MDO/MEDQ  D:  01/28/2008  T:  01/28/2008  Job:  [id number]

## 2010-11-15 NOTE — Op Note (Signed)
[place]  Patient:    [name], [name] Visit Number: [id number] MRN: [id number]          Service Type: SUR Location: 3000 [id number] Attending Physician:  Emeterio Reeve Dictated by:   Payton Doughty, M.D. Proc. Date: 11/02/01 Admit Date:  11/02/2001                             Operative Report  PREOPERATIVE DIAGNOSIS:  Spondylosis with myelopathy at C4-5 and C5-6.  POSTOPERATIVE DIAGNOSIS:  Spondylosis with myelopathy at C4-5 and C5-6.  PROCEDURE:  C4-5, C5-6 anterior cervical diskectomy and fusion with Tether plate.  SURGEON:  Payton Doughty, M.D.  NURSE ASSISTANT:  [facility].  DOCTOR ASSISTANT:  Danae Orleans. Venetia Maxon, M.D.  ANESTHESIA:  General endotracheal.  PREPARATION:  Sterile Betadine prep and scrub with alcohol wipe.  DESCRIPTION OF PROCEDURE:  A 77 year old right-handed white lady with severe cervical spondylitic myelopathy at C4-5 and C5-6.  Taken to the operating room and smoothly anesthetized and intubated, placed supine on the operating table. Following shave, prep, and drape in the usual sterile fashion, the skin was incised in the midline in the medial border of the sternocleidomastoid muscle on the left side.  The platysma was identified, elevated, divided, and undermined.  The sternocleidomastoid was identified.  Medial dissection revealed the carotid artery retracted laterally to the left, trachea and esophagus retracted laterally to the right, exposing the bones of the anterior cervical spine.  A marker was placed, intraoperative x-ray obtained to confirm correctness of the level.  Having confirmed correctness of the level, the longus colli was taken down bilaterally and the Shadow Line retractor placed. Diskectomy was carried out at 4-5 and C5-6, first under gross observation, then the operating microscope was brought in and microdissection technique used to complete the decompression at 4-5 and 5-6.  At each level but  mostly at 4-5 there was severe degenerative disk disease, bone-on-bone contact, and large osteophytes projecting posteriorly into the spinal canal as well as into the neural foramen.  These were removed without difficulty.  C4-5 was mostly degenerative disk disease with posteriorly protruding disk material. Following complete decompression of both foramina at both levels, 7 mm bone grafts were fashioned from patellar allograft and tapped into place.  A Tether plate was then placed, two screws in C4, one in C5, and two in C6. Intraoperative x-ray showed good placement of bone grafts, plate, and screws. The wound was irrigated and hemostasis assured.  The platysma was reapproximated with 3-0 Vicryl in interrupted fashion, subcutaneous tissue was reapproximated with 3-0 Vicryl in interrupted fashion, and the skin was closed with 4-0 Vicryl in a running subcuticular fashion.  Benzoin and Steri-Strips were placed and made occlusive with Telfa and OpSite.  The patient then returned to the recovery room in good condition. COMPLICATIONS:  None. Dictated by:   Payton Doughty, M.D. Attending Physician:  Emeterio Reeve DD:  11/02/01 TD:  11/04/01 Job: [id number]

## 2010-11-15 NOTE — Op Note (Signed)
[name], [name]                ACCOUNT NO.:  [id number]   MEDICAL RECORD NO.:  [id number]          PATIENT TYPE:  OIB   LOCATION:  1605                         FACILITY:  [facility]   PHYSICIAN:  Velora Heckler, MD      DATE OF BIRTH:  1925-10-03   DATE OF PROCEDURE:  02/27/2006  DATE OF DISCHARGE:                                 OPERATIVE REPORT   PREOPERATIVE DIAGNOSES:  1. Primary hyperparathyroidism.  2. Left thyroid nodule.   POSTOPERATIVE DIAGNOSES:  1. Primary hyperparathyroidism.  2. Left thyroid nodule.   PROCEDURE:  Left thyroid lobectomy and neck exploration.   SURGEON:  Velora Heckler, MD, FACS   ASSISTANT:  Wilmon Arms. Corliss Skains, MD, FACS   ANESTHESIA:  General per Dr. Ronelle Nigh.   ESTIMATED BLOOD LOSS:  Minimal.   PREPARATION:  Betadine.   COMPLICATIONS:  None.   INDICATIONS:  The patient is a 77 year old white female followed in my  practice for the past 2 years.  She has had elevated serum calcium levels  ranging from 10.6-10.8.  Her intact parathyroid hormone level is elevated at  91.  Sestamibi scan localized an area of increased uptake to the left  inferior position.  Thyroid ultrasound showed a heterogeneous nodule in the  left thyroid lobe.  The patient now comes to surgery for neck exploration  and parathyroidectomy.   DESCRIPTION OF PROCEDURE:  The procedure was done in OR #3 at the [facility].  The patient is brought to the operating room,  placed in a supine position on the operating room table.  Following  administration of general anesthesia, the patient is positioned and then  prepped and draped in the usual strict aseptic fashion.  After ascertaining  that an adequate level of anesthesia had been obtained, a left inferior neck  incision was made with a #15 blade.  Dissection was carried down through  subcutaneous the subcutaneous tissues and platysma, hemostasis was obtained  with the electrocautery.  Skin flaps were  developed.  There is some scarring  from her previous anterior cervical fusion on the left.  A Weitlaner  retractor was placed for exposure.  Strap muscles are incised in the midline  and reflected laterally.  The left thyroid lobe is exposed.  The left  thyroid lobe is moderately enlarged.  It is multinodular.  There is  significant scarring from her previous surgical procedure.  With gentle  dissection, the left inferior pole is exposed.  Dissection was carried down  the tracheoesophageal groove.  The left lobe is mobilized anteriorly and the  tracheoesophageal groove is explored.  There is no obvious evidence of  parathyroid adenoma.  The thyroid thymic tract is opened and explored into  the anterior mediastinum.  Again no sign of adenoma is found.  The left  thyroid lobe is multinodular. The incision is extended the entire left lobe  is exposed.  It is mobilized.  It is rolled anteriorly.  No sign of adenoma  is identified posterior to the left thyroid lobe.  Given the abnormality of  the left  thyroid lobe and its enlarged size and multiple nodules, a decision  is made to proceed with left thyroid lobectomy.  The superior pole vessels  are ligated in continuity between 2-0 silk ties and medium Ligaclips and  divided.  The gland is rolled anteriorly.  Branches of the inferior thyroid  artery are divided between small and medium Ligaclips.  The inferior venous  tributaries are divided between small and medium Ligaclips.  The Gland is  rolled anteriorly. A superior parathyroid gland is identified.  It is  normal.  It was maintained on its vascular pedicle and preserved.  The recurrent nerve actually has two main trunks both of which are  preserved.  The ligament of Allyson Sabal is transected with the electrocautery and  the gland is rolled anteriorly.  The gland is mobilized across the midline  and the isthmus is transected between hemostats and suture ligated with 3-0  Vicryl suture ligatures.   The left thyroid lobe is sectioned on the table.  It contains multiple nodules.  No obvious parathyroid tissue is identified.  It is submitted to pathology with a suture marking the inferior pole.  Dr.  Guerry Bruin did do frozen section on this region of the gland but saw only  findings consistent with adenomatous nodule.  The gland will be put in for  multiple sections to rule out an intrathyroidal parathyroid gland.  The left  neck is irrigated.  Good hemostasis is achieved.  Further dissection in the  retroesophageal space reveals no evidence of adenoma.  At this point, a  decision was made to discontinue the exploration.  A decision is made not to  proceed with exploration of the right side until final pathology results and  biochemical results were available based on the surgery already performed on  the left.  Therefore the wound was irrigated and evacuated.  Good hemostasis  is noted.  Surgicel was placed in the tracheoesophageal groove and over the  recurrent nerve and superior parathyroid gland.  Strap muscles were  reapproximated in the midline with interrupted 3-0 Vicryl sutures. The  platysma was closed with interrupted 3-0 Vicryl sutures.  The skin is closed  with a running 4-0 Vicryl subcuticular suture.  The wound is washed and  dried and Benzoin and Steri-Strips are applied.  Sterile dressings are  applied.  The patient is awakened from anesthesia and brought to the  recovery room in stable condition.  The patient tolerated the procedure  well.      Velora Heckler, MD  Electronically Signed     TMG/MEDQ  D:  02/27/2006  T:  02/27/2006  Job:  [id number]   cc:   Dorisann Frames, M.D.  Fax: [phone]   Talmadge Coventry, M.D.  Fax: [phone]

## 2010-11-15 NOTE — Discharge Summary (Signed)
NAME:  [name], [name]                          ACCOUNT NO.:  [id number]   MEDICAL RECORD NO.:  [id number]                   PATIENT TYPE:  INP   LOCATION:  5023                                 FACILITY:  [facility]   PHYSICIAN:  Mila Homer. Sherlean Foot, M.D.              DATE OF BIRTH:  08-02-1925   DATE OF ADMISSION:  05/15/2003  DATE OF DISCHARGE:  05/18/2003                                 DISCHARGE SUMMARY   ADMISSION DIAGNOSES:  1. Osteoarthritis, lateral compartment bilateral knees, left worse than     right.  2. Type 2 diabetes mellitus.  3. Hypertension.  4. Diverticulosis.  5. Hyperparathyroidism.   DISCHARGE DIAGNOSES:  1. Osteoarthritis, lateral compartment bilateral knees, status post left     unicompartmental knee arthroplasty.  2. Acute blood loss anemia secondary to surgery.  3. Constipation.  4. Type 2 diabetes mellitus.  5. Hypertension.  6. Diverticulosis.  7. Hyperparathyroidism.   SURGICAL PROCEDURE:  On May 15, 2003, [name] underwent a left  unicompartmental knee arthroplasty of the lateral compartment of the knee by  Dr. Mila Homer. Howard, assisted by Oneida Alar, P.A.-C.  She had a Miller-  Galante precoated, unicompartmental knee, tibial plate, left lateral series  4 placed, medial lateral 32 mm by anterior posterior 52 mm.  A femoral  component AP 47 mm right medial left lateral regular.  A Miller-Galante ME  tibial articular surface, series 4, 10 mm thickness.   COMPLICATIONS:  None.   CONSULTATIONS:  1. Case management and rehab medicine and physical therapy consult on     May 16, 2003.  2. Occupational therapy consult on May 17, 2003.   HISTORY OF PRESENT ILLNESS:  This 77 year old white female patient presented  to Dr. Sherlean Foot with left knee pain for the last five years.  The pain is  constant and present with walking, getting up and down, or bending over to  pick up objects.  It is a severe aggravating pain, and she has some  mechanical  symptoms with it.  She has failed conservative treatment.  Because of that, she is presenting for a left unicompartmental arthroplasty.   HOSPITAL COURSE:  [name] tolerated her surgical procedure well without  immediate postoperative complications.  She was subsequently transferred to  5000.  On postoperative day #1, she was afebrile, vitals were stable.  Hemoglobin was 11.4, hematocrit 33.8.  Left knee dressing was intact without  drainage and leg was neurovascularly intact.  She did have some calf pain  and it was monitored.  She was continued on therapy and plans were made for  hopefully rehab at discharge due to she lives by herself and does not have  any help.   On postoperative day #2, she continued to have calf pain so a Doppler was  obtained which was negative for deep vein thrombosis.  Left knee incision  was well-approximated with staples and no drainage.  Hemoglobin was 11.6,  hematocrit 34.4.  A Doppler was obtained and then she was continued on  therapy.   On postoperative day #3, she continues to complain of calf pain, but  otherwise pain is well controlled.  Temperature max is 100.4, vitals are  stable.  Hemoglobin 10.3, hematocrit 29.5.  She is ready for transfer to  rehab today and will be transferred there later today.   DIET:  Continue her current hospitalization diet.   DISCHARGE MEDICATIONS:  Continue her current hospitalization medications  with adjustments to be made per rehab physicians.  This includes:  1. Sliding scale insulin.  2. Colace 100 mg p.o. b.i.d.  3. Trinsicon one p.o. t.i.d.  4. Lovenox 30 subcu q.12h.  5. Actos 30 mg p.o. q. day.  6. Lotensin 10 mg p.o. q. day.  7. Glucotrol XL 2.5 mg p.o. q. day.  8. Multivitamin one tablet p.o. q. day.  9. Protonix 40 mg p.o. q. day.  10.      OxyContin 10 mg p.o. q.12h.  11.      Senokot S p.r.n.  12.      Laxative of choice and enema of choice as needed.  13.      Percocet one to two p.o. q.4h.  p.r.n. for pain.  14.      Phenergan 25 mg p.o. IV or p.r. q.6h. p.r.n. nausea.  15.      Tylenol p.r.n. for pain.  16.      Robaxin 500 mg one to two p.o. q.6h. p.r.n. spasms.  17.      Restoril 30 mg p.o. q.h.s. p.r.n. insomnia.   ACTIVITY:  She is to be out of bed weightbearing as tolerated on the left  leg with the use of a walker.  She is to continue PT and OT per rehab  protocol.   WOUND CARE:  Please keep the left knee incision clean and dry with Betadine  once a day and apply dry dressing.  Staples can be removed with Steri-Strips  with Benzoin applied on postoperative day #14.  This can be done in rehab if  she is still there, otherwise, she needs to follow up with Dr. Sherlean Foot at that  time.  She is to notify Dr. Sherlean Foot of temperature greater than 101.5, chills,  pain unrelieved by pain medications, or foul-smelling drainage from the  wound.   FOLLOWUP:  She needs to follow up with Dr. Sherlean Foot in our office on about  postoperative day #14 if her staples are still in place, otherwise, she can  follow up with him about a week after discharge from rehab.  She needs to  call [phone] for that appointment.   LABORATORY DATA:  Chest x-ray done on April 24, 2003, showed chronic lung  changes and an 8 to 9 mm left upper lobe nodule, and a CT was recommended  for further assessment.  CT scan with contrast done on April 26, 2003,  showed no left upper lobe nodule seen, minimal scarring in the apices, no  adenopathy.  She has a probable thyroid adenoma in the left lobe of the  thyroid near the isthmus, and thyroid ultrasound is recommended if necessary  to assess it further.  She has multiple hepatic cysts.   On May 16, 2003, hemoglobin 11.4, hematocrit 33.8.  On May 17, 2003, hemoglobin 11.6, hematocrit  34.4, and on November 81, 2004,  hemoglobin 10.3, hematocrit 29.5, white count 7, and platelets 190.  On May 10, 2003, glucose 123, calcium 11.5.  On May 16, 2003,  sodium 130, potassium 3.5, glucose 124, calcium 9.9.  On May 17, 2003,  sodium 135, potassium 3.9, chloride 102, CO2 29, BUN 6, creatinine 0.6, and  glucose 116.  All other laboratory studies were within normal limits.      Legrand Pitts Duffy, P.A.                      Mila Homer. Sherlean Foot, M.D.    KED/MEDQ  D:  05/18/2003  T:  05/19/2003  Job:  [id number]   cc:   Talmadge Coventry, M.D.  [address]  [place]  [place] [address]  Fax: [phone]

## 2010-11-15 NOTE — Procedures (Signed)
[facility]  Patient:    [name], [name]                         MRN: [id number] Proc. Date: 04/29/00 Adm. Date:  [id number] Attending:  Charna Elizabeth CC:         Jenel Lucks, M.D.   Procedure Report  DATE OF BIRTH:  [birth year].  PROCEDURE:  Colonoscopy.  ENDOSCOPIST:  Anselmo Rod, M.D.  INSTRUMENTS USED:  Olympus video colonoscope.  INDICATIONS:  Screening colonoscopy being performed on a 77 year old white female.  Rule out colonic polyps, masses, hemorrhoids, etc.  INFORMED CONSENT:  Informed consent was procured from the patient.  The patient was fasted for 8 hours prior to the procedure and prepped with a bottle of magnesium citrate and a gallon of nulytely the night prior to the procedure.  PREPROCEDURE PHYSICAL EXAMINATION:  VITAL SIGNS:  The patient had stable vital signs.  NECK:  Supple.  CHEST:  Clear to auscultation.  S1, S2 regular.  ABDOMEN:  Soft with normal abdominal bowel sounds.  DESCRIPTION OF PROCEDURE:  The patient was placed in the left lateral decubitus position and sedated with 50 mg of Demerol and 4 mg of Versed intravenously.  Once the patient was adequately sedated and maintained on low flow oxygen, and continuous cardiac monitoring, the Olympus video colonoscope was advanced from the rectum to the cecum without difficulty except for a pan diverticular disease with several large mild diverticula throughout the colon with more prominent changes in the left colon.  No masses, polyps, erosions or ulcerations were seen.  There was inspecific stool in some of the diverticular pockets.  All external hemorrhoids were appreciated on inspection.  No other abnormalities were seen up to the cecum and terminal ileum appeared healthy.  IMPRESSION: 1. Pan diverticular disease with more prominent changes in the left colon. 2. Small external hemorrhoids.  RECOMMENDATIONS: 1. The patient has been advised to increase  her fluid and fiber in the diet. 2. Information on diverticular disease and diverticulitis has been handed to    her for education. 3. Outpatient follow up is advised on a p.r.n. basis. 4. She has been advised to followup with Dr. Jenel Lucks and to contact me for    further GI problems. DD:  04/29/00 TD:  04/29/00 Job: [id number]

## 2010-11-15 NOTE — Op Note (Signed)
NAME:  [name], [name]                          ACCOUNT NO.:  [id number]   MEDICAL RECORD NO.:  [id number]                   PATIENT TYPE:  OIB   LOCATION:  2895                                 FACILITY:  [facility]   PHYSICIAN:  Adolph Pollack, M.D.            DATE OF BIRTH:  11-Jan-1926   DATE OF PROCEDURE:  04/27/2003  DATE OF DISCHARGE:                                 OPERATIVE REPORT   PREOPERATIVE DIAGNOSIS:  Symptomatic cholelithiasis.   POSTOPERATIVE DIAGNOSIS:  Symptomatic cholelithiasis.   PROCEDURE:  Laparoscopic cholecystectomy with intraoperative cholangiogram.   SURGEON:  Adolph Pollack, M.D.   ASSISTANT:  Abigail Miyamoto, M.D.   ANESTHESIA:  General.   INDICATIONS:  [name] is a 77 year old female who began having some  epigastric abdominal pains and some nausea.  She particularly noticed this  when she ate a pizza.  She took Prilosec but still has the pain and it  sometimes radiates to the right upper quadrant.  A CT scan demonstrates some  calcified gallstones.  She now presents for elective cholecystectomy.   TECHNIQUE:  She was seen in the holding area and then brought to the  operating room and placed supine on the operating table and a general  anesthetic was administered.  Her abdominal wall was sterilely prepped and  draped.  Local anesthetic consisting of dilute Marcaine was infiltrated in  the subumbilical region and a small subumbilical incision was made through  the skin, subcutaneous tissue and midline fascia.  The peritoneal cavity was  entered then bluntly and under direct vision.  A pursestring suture of 0  Vicryl was placed around the fascial edges.  A Hasson trocar was introduced  into the peritoneal cavity and a pneumoperitoneum was created by  insufflation of CO2 gas.  Next, the laparoscope was introduced.  She had a  normal-appearing liver.  The gallbladder appeared slightly discolored with  some adhesions to the omentum.  She was  then placed in the reversed  Trendelenburg position with the right side tilted slightly up.  Under direct  vision, an 11-mm trocar was placed through an epigastric incision and two 5-  mm trocars were placed in the right mid-abdomen.  The fundus of the  gallbladder was grasped and retracted toward the right shoulder and the  adhesions were taken down bluntly until the infundibulum was exposed.  The  infundibulum was then grasped and mobilized using cautery and blunt  dissection, staying on the gallbladder.  I then was able to isolate the  cystic duct at its junction with the gallbladder and create a window around  it.  A clip was placed just above the cystic duct-gallbladder junction and a  small incision made at the cystic duct-gallbladder junction.  Bile was able  to be milked from this.  A cholangiocatheter was passed through the anterior  abdominal wall into the cystic duct and a cholangiogram was performed.  Under real-time fluoroscopy, dilute contrast material was injected into the  cystic duct.  The cystic duct filled promptly, as did the common hepatic,  right and left hepatic and common bile ducts.  The common bile duct drained  contrast rapidly into the duodenum without obvious evidence of obstruction.  Final report is pending the radiologist's interpretation.   The cholangiocatheter was removed, and the cystic duct was then clipped  three times staying inside and divided sharply.  The cystic artery was  identified and the anterior branch clipped and divided and the posterior  branch clipped and divided.  Using electrocautery, the gallbladder was  dissected free from the liver bed intact.  The gallbladder fossa was  irrigated and bleeding points controlled with the cautery.  Once hemostasis  was adequate, the perihepatic area was irrigated.  The fluid was evacuated  and was clear.  The gallbladder was then removed through the subumbilical  port and the subumbilical fascial  defect closed by tightening up and tying  down the pursestring suture.  The remaining irrigation fluid was removed and  the trocars were remove and the pneumoperitoneum was released.  The skin  incisions were closed with 4-0 Monocryl subcuticular stitches.  Steri-Strips  and sterile dressings were applied.  She tolerated the procedure well  without any apparent complications.  She subsequently was taken to the  recovery room in satisfactory condition.                                               Adolph Pollack, M.D.    Kari Baars  D:  04/27/2003  T:  04/27/2003  Job:  [id number]   cc:   Talmadge Coventry, M.D.  [address]  [place]  [place] [address]  Fax: [phone]   Anselmo Rod, M.D.  [address]  [address]  [place]  [place] [address]  Fax: [phone]

## 2010-11-15 NOTE — H&P (Signed)
NAME:  [name], [name]                          ACCOUNT NO.:  [id number]   MEDICAL RECORD NO.:  [id number]                   PATIENT TYPE:  INP   LOCATION:                                       FACILITY:  [facility]   PHYSICIAN:  Mila Homer. Sherlean Foot, M.D.              DATE OF BIRTH:  10/03/1925   DATE OF ADMISSION:  05/15/2003  DATE OF DISCHARGE:                                HISTORY & PHYSICAL   CHIEF COMPLAINT:  Left-knee pain.   HISTORY OF PRESENT ILLNESS:  The patient is a 77 year old white female with  left-knee pain for approximately five years.  She describes the pain as  constant.  The pain is associated with walking, getting up and down from a  sitting position, or bending over to pick up objects.  The pain is a severe,  aggravating pain.  It is burning and stabbing in nature.  Mechanical  symptoms include giving away, catching and painful popping.  The pain awakes  her at night.  She has tried coated aspirin for her knee pain with no  relief.  She occasionally uses a cane to assist her in ambulation.  She has  had Hyalgan injections and cortisone injections in the knee with no greater  than three months relief of the pain.   ALLERGIES:  Sulfa drugs.   MEDICATIONS:  1. Actos 30 mg one p.o. daily.  2. Glucotrol XL 2.5 mg p.o. daily.  3. Lisinopril 10 mg p.o. daily.  4. Fish oil, two tablets daily.  5. Multivitamin one daily.  6. Prilosec 20 mg, one p.o. daily, p.r.n.   PAST MEDICAL HISTORY:  1. Diabetes mellitus.  2. Hypertension.  3. Diverticulosis.  4. Hyperparathyroidism.   PAST SURGICAL HISTORY:  1. Thyroidectomy.  2. Hemorrhoidectomy.  3. Cervical fusion at C3-C4, and C4-C5 in May of 2003.  4. Cholecystectomy April 27, 2003.   SOCIAL HISTORY:  The patient denies any alcohol or tobacco use.  She is  widowed and lives alone.   FAMILY PHYSICIAN:  Dr. Alexia Freestone ________, phone number [phone].   SOCIAL HISTORY:  She lives in a one-story home with two sets  to the usual  entrance.  She is a retired [employer].   FAMILY HISTORY:  Mother is deceased at age 56 due to heart disease and  diabetes mellitus.  Father is deceased at age 73 of a stroke.  She has one  living sister, age 73 whose only known health problem is dementia.   REVIEW OF SYSTEMS:  Positive for glasses.  The patient is just getting over  a cold, but has no coughing, fever or rhinorrhea.  She denies chest pain,  PND, orthopnea.  No nocturia.  Otherwise, review of systems is negative.   PHYSICAL EXAMINATION:  VITALS:  Temperature 96.4, pulse 64, blood pressure  160/70, respiratory rate 16.  GENERAL:  The patient is well-developed, well-nourished female.  She walks  with an antalgic gait on the left.  She has an obvious deformity  bilaterally.  the patient moderate and affect are appropriate.  She talks  easily with the examiner.  VITAL SIGNS:  Height 5 feet, 4 inches.  Weight 135 pounds.  BMI is 23.2.  CARDIAC:  Regular rate and rhythm.  No murmurs, rubs or gallops noted.  LUNGS:  Clear to auscultation bilaterally.  No wheezing or rhonchi are  noted.  ABDOMEN:  Nontender to palpation except for the right upper quadrant.  There  is a port site in the right upper quadrant, and the umbilical region from  recent laparoscopy cholecystectomy.  There are no signs of infection at  either of these port sites.  Bowel sounds in four quadrants.  NECK:  Supple.  Carotids are 2+ bilaterally without bruits.  No tenderness  over the cervical spine to palpation.  Full range of motion of the cervical  spine.  BACK:  Lumbar and thoracic spine are nontender to palpation.  Straight leg  raise is negative bilaterally.  HEENT:  Normocephalic, atraumatic without frontal or maxillary sinus  tenderness to palpation.  EOM's are intact.  Conjunctivae pink bilaterally.  Sclerae is nonicteric bilaterally.  PERRLA.  No visual ear deformity is  noted.  TMs are pearly and gray bilaterally.  Nasal  mucosa is pink and  moist.  Nose and nasal septum are midline.  No evidence of nasal polyps.  Mucosa is moist and pink.  Good dentition.  Pharynx is without erythema or  exudate.  Tongue and uvula are midline.  MUSCULOSKELETAL:  Full range of motion of both hips.  Right knee 25 to 130  degrees of flexion.  Mild valgus deformity.  Valgus varus stressing revealed  no laxity, and produced no pain.  Joint line is nontender to palpation.  EXTREMITIES:  Left knee 0-110 degrees of flexion. Left valgus deformity.  Valgus, varus stressing caused pain in the medial and lateral compartments.  Lateral compartment painful to palpation.  Lower extremities are non-  edematous. Pedal pulses 2+ bilaterally.  HL and FHL intact bilaterally.  NEUROLOGIC:  The patient is alert and oriented x3.  Cranial nerves II-XII  are grossly intact.  Deep tendon reflexes are 2+ bilaterally in the upper  and lower extremities.  Strength testing against resistance is 5/5 in the  upper and lower extremities except for in the left lower extremity flexion  and extension of the knee against resistance, 4/5.   X-rays of the left knee show right knee moderate lateral compartment  collapse.  Both knees have well-preserved medial and patellar compartments.   IMPRESSION:  1. Bilateral knee collateral compartment collapse with well-preserved medial     and patellar compartment, left greater than right.  2. Diabetes mellitus.  3. Hypertension.  4. Diverticulosis.  5. Hyperparathyroidism.    PLAN:  The patient will be admitted to [facility] on May 15, 2003, and undergo a left total knee versus uni-knee replacement.  The  patient may require rehabilitation due to the fact that she has no family  and lives alone.      Richardean Canal, P.A.                       Mila Homer. Sherlean Foot, M.D.    GC/MEDQ  D:  05/04/2003  T:  05/04/2003  Job:  [id number]

## 2010-11-15 NOTE — Discharge Summary (Signed)
NAME:  [name], [name]                          ACCOUNT NO.:  [id number]   MEDICAL RECORD NO.:  [id number]                   PATIENT TYPE:  IPS   LOCATION:  4149                                 FACILITY:  [facility]   PHYSICIAN:  Erick Colace, M.D.           DATE OF BIRTH:  [birth year]   DATE OF ADMISSION:  05/18/2003  DATE OF DISCHARGE:  05/24/2003                                 DISCHARGE SUMMARY   DISCHARGE DIAGNOSES:  1. Left knee uni-arthroplasty secondary to degenerative joint disease.  2. Cellulitis.  3. History of hypertension.  4. History of diabetes mellitus.  5. History of hyperparathyroidism.  6. History of hyperlipidemia.   HISTORY OF PRESENT ILLNESS:  The patient is a 77 year old white female with  past medical history of left knee pain which failed conservative care and  underwent left knee uni-arthroplasty on May 15, 2003 by Dr. Mila Homer.  [place].  Patient placed on Lovenox for DVT prophylaxis.  PT report at this  time indicates that the patient can transfer sit to stand, MIN-assist,  ambulating at MIN-assist to 20 feet and weightbearing as tolerated.  Hospital course was significant for hyponatremia and anemia.   PRIMARY CARE Elaynah Virginia:  Primary care Alazia Crocket is Dr. Talmadge Coventry.   PAST MEDICAL HISTORY:  Past medical history is significant for:  1. Diabetes.  2. Hypertension.  3. Hypothyroidism.  4. Hyperlipidemia.   PAST SURGICAL HISTORY:  Past surgical history is significant for  cholecystectomy and hemorrhoidectomy.   MEDICATIONS PRIOR TO ADMISSION:  1. Actos 300 mg daily.  2. Glucotrol XL 2.5 mg daily.  3. Prilosec 20 mg a day.  4. Oxycodone p.r.n.  5. Multivitamin p.o. p.r.n.  6. Benazepril 10 mg daily.   ALLERGIES:  Allergy to SULFA.   SOCIAL HISTORY:  The patient lives in a two-level home on the first level  where she sleeps.  No tobacco or alcohol.  She was independent prior to  admission.  She is widowed.  She has limited family  support.  She has no  children.  She only has two cousins and __________ [place].   HOSPITAL COURSE:  Mrs.  [name] was admitted to [facility]  Department on May 18, 2003 for comprehensive inpatient rehabilitation  where she received more than three hours of therapy daily.  Overall, Mrs.  [name] made great progress during her seven-day stay in rehab.  She was  discharged at a modified independent level.  The patient was able to  ambulate greater than 100 feet with rolling walker and had greater than 60  degrees of flexion in her left knee.  Hospital course was significant for  cellulitis and anemia.  The patient had an admission hemoglobin of 11.0 and  31.7.  She remained on a multivitamin __________ daily.  The patient was  placed on Keflex 500 mg one tab four times daily for a total of seven days  due  to mild cellulitis around surgical incision; Keflex was started on  May 22, 2003.  After Keflex -- first dose -- the redness did improve.  The patient's blood sugar remained under good control on Glucotrol as well  as Actos.  No adjustment was necessary in the diabetic or blood pressure  medications.  Pain has been controlled on OxyContin as well as oxycodone.  She remained on Lovenox 30 mg subcu q.12 h. during her stay in rehab and was  discharged on Lovenox 40 mg subcu daily for two more days.  There were no  other major issues that occurred while the patient was in rehab.   Latest labs indicate that the patient had a urine culture performed on  May 18, 2003 of 1000 colonies, insignificant growth.  Her hemoglobin  was 11.1, hematocrit 31.7, white blood cell count 6.1, platelet count of  227,000.  Sodium 138, potassium 3.6, chloride 103, CO2 31, glucose 101, BUN  8, creatinine 0.7, AST 20, ALT 12.  Urinalysis performed was negative.   At the time of discharge, staples are still intact.  There was mild  erythema, about 1+ edema.  PT report at the time of discharge  indicates that  the patient is able to ambulate approximately modified independently greater  than 100 feet, able to transfer sit-to-stand modified independently, able to  do bed mobility modified independently.  She was able to do all ADLs at  modified independent level.  The patient's range of motion was approximately  90 degree of range of flexion in her knee by the CPM machine.  The patient  was discharged home with her family.  By the time of discharge, all vitals  were stable.   DISCHARGE MEDICATIONS:  Discharge medications include:  1. Actos 30 mg daily.  2. Glucotrol XL 2.5 mg daily.  3. Prilosec 20 mg daily.  4. Lotensin 10 mg daily.  5. Keflex 500 mg one tablet four times daily until May 27, 2003.  6. OxyContin 10 mg q.12 h., follow taper.  7. Oxycodone 5 to 10 mg every four to six hours as needed.  8. Lovenox 40 mg subcu daily x2 more days.   PAIN MANAGEMENT:  Pain management with OxyContin and oxycodone.   ACTIVITY:  Weightbearing as tolerated.  No drinking.  No driving or alcohol.  Use walker.   DIET:  No concentrated sweets.  Check CBGs at least twice daily and record  results.   WOUND CARE:  Staples to be removed by Dr. Tobin Chad office next week.   FOLLOWUP:  [facility] for PT and OT.  She is to follow up with  Dr. Sherlean Foot next week for appointment for staple removal, follow up with  primary care Jessia Kief in six to eight weeks and follow up with Dr. Erick Colace as needed.      Junie Bame, P.A.                       Erick Colace, M.D.    LH/MEDQ  D:  05/24/2003  T:  05/25/2003  Job:  [id number]   cc:   Mila Homer. Sherlean Foot, M.D.  [address] [place]  [place] [address]  Fax: [phone]   Talmadge Coventry, M.D.  [address]  [place]  [place] [address]  Fax: [phone]   Erick Colace, M.D.  [address] [place]  [place] [address]  Fax: [phone]

## 2010-11-15 NOTE — H&P (Signed)
[place]. [facility]  Patient:    [name], [name] Visit Number: [id number] MRN: [id number]          Service Type: SUR Location: [id number] Attending Physician:  Emeterio Reeve Dictated by:   Payton Doughty, M.D. Admit Date:  11/02/2001 Discharge Date: 11/05/2001                           History and Physical  ADMITTING DIAGNOSIS: Spondylosis with no myelopathy, C4-5 and C5-C6.  HISTORY OF PRESENT ILLNESS: This is a very nice 77 year old right-handed white lady, who had been having neck difficulty and discomfort down her arms, right worse than left, hands get tingling and numbness.  She underwent an MRI that showed spondylosis and was referred to me.  PAST MEDICAL HISTORY: Remarkable for adult onset diabetes.  MEDICATIONS:  1. Glucotrol XL 10 mg q.d.  2. Actos 15 mg q.d.  3. Lotensin 10 mg q.d.  4. Evista 60 mg q.d.  5. Fish oil.  6. Centrum Silver.  7. Ecotrin.  ALLERGIES: SULFA.  PAST SURGICAL HISTORY:  1. Fibroidectomy in 1970s.  2. Hemorrhoidectomy in 1980s.  SOCIAL HISTORY: She does not smoke.  Is a very seldom social drinker.  Is retired.  FAMILY HISTORY: Mother died at 13 of congestive heart failure.  Her Daddy died at 69 of a stroke.  REVIEW OF SYSTEMS: Remarkable for glasses, hypertension, back pain, leg pain, joint pain, arthritis, and diabetes.  She is on deck to have her knees replaced at some point in time.  PHYSICAL EXAMINATION:  HEENT: Examination within normal limits.  She has positive Lhermittes, especially with turning her head toward the right and extension.  CHEST: Clear.  CARDIAC: Regular rate and rhythm.  ABDOMEN: Nontender.  No hepatosplenomegaly.  EXTREMITIES: No clubbing or cyanosis.  Peripheral pulses good.  GU: Examination deferred.  NEUROLOGIC: She is awake, alert, and oriented.  Cranial nerves intact.  Motor examination shows 5/5 strength throughout the upper extremities save for the right  biceps, which is 5-/5.  She has no Hoffman.  Sensory deficit described in the right C6-C7 distribution.  Reflexes are absent at the right biceps, 1 at the triceps, 1 at the left biceps and 2 at the left triceps, brachial radialis flicker bilaterally.  Lower extremities are nonmyelopathic.  LABORATORY DATA: She comes accompanied with an MRI that demonstrates spondylitic disease at 3-4, 4-5, 5-6 and 6-7, most seriously effecting 4-5 and 5-6 with right side foraminal narrowing and cord displacement.  CLINICAL IMPRESSION: Cervical spondylosis with myelopathy.  PLAN: The plan is for anterior cervical diskectomy and fusion at C4-5 and C5-C6.  The risks and benefits of this approach have been discussed with her and she wishes to proceed. Dictated by:   Payton Doughty, M.D. Attending Physician:  Emeterio Reeve DD:  11/02/01 TD:  11/03/01 Job: [id number]

## 2010-11-15 NOTE — Op Note (Signed)
NAME:  [name], [name]                          ACCOUNT NO.:  [id number]   MEDICAL RECORD NO.:  [id number]                   PATIENT TYPE:  INP   LOCATION:  5023                                 FACILITY:  [facility]   PHYSICIAN:  Mila Homer. Sherlean Foot, M.D.              DATE OF BIRTH:  June 07, 1926   DATE OF PROCEDURE:  05/15/2003  DATE OF DISCHARGE:  05/18/2003                                 OPERATIVE REPORT   PREOPERATIVE DIAGNOSIS:  Left knee lateral compartment arthritis.   POSTOPERATIVE DIAGNOSIS:  Left knee lateral compartment arthritis.   OPERATION PERFORMED:  Left unicompartmental, lateral compartmental  arthroplasty.   SURGEON:  Mila Homer. Sherlean Foot, M.D.   ASSISTANT:  Jamelle Rushing, P.A.   ANESTHESIA:  General.   INDICATIONS FOR PROCEDURE:  The patient is a 77 year old with lateral  compartment arthritis.  Informed consent was obtained.   DESCRIPTION OF PROCEDURE:  The patient was laid supine, administered general  anesthesia, the left lower extremity was prepped and draped in the usual  sterile fashion.  A #10 blade was used to make a midline incision from the  interior pole of the patella to the tibial tubercle.  Arthrotomy was  performed and the fat pad on the lateral side was removed to show the  lateral compartment.  The capsule was marked and T'd for exposure site and  the corners of that capsular T were tagged with 0 Vicryl sutures and used  for retraction purposes.  I then took the anterior lip of the tibial plateau  off and placed our tension utilizing  the knee in extension and used C-arm  to find the mechanical axis and pinned the cutting block into place and made  our distal femoral cut.  I then slipped that guide off and went into flexion  using the tibial guide to take 10 mm of bone and made that cut with the  sagittal saw protecting the ligamentous structures.  I then removed the cut  piece of bone as well as the lateral meniscus.  I then used the standard  template  and pinned that into place.  I was able to make our Chamfer cut as  well as our lug holes for a regular sized left lateral implant.  I then used  the tibial sizer and sized to a size 4 and I drilled the lug holes and then  left that in place for a trial, placed the regular femur on and then placed  in a 10 trial and had excellent flexion and extension gap balance.  It  tracked very, very nicely.  I then removed the trials, copiously irrigated  and cemented in the components.  I then repaired our capsular T with  interrupted #1 figure-of-eight sutures, arthrotomy with interrupted figure-  of-eight Vicryl sutures, deep soft tissues with interrupted 0 Vicryls and  subcuticular 2-0 Vicryls and skin staples.  The patient tolerated the  procedure well.   COMPLICATIONS:  None.   DRAINS:  One pain catheter.                                               Mila Homer. Sherlean Foot, M.D.   SDL/MEDQ  D:  07/03/2003  T:  07/03/2003  Job:  [id number]

## 2010-11-15 NOTE — Discharge Summary (Signed)
[place]. [facility]  Patient:    HAMNA, ASA Visit Number: [id number] MRN: [id number]          Service Type: SUR Location: 3000 [id number] Attending Physician:  Emeterio Reeve Dictated by:   Payton Doughty, M.D. Admit Date:  11/02/2001 Discharge Date: 11/05/2001                             Discharge Summary  ADMISSION DIAGNOSIS:  Spondylosis with myelopathy at C4-5 and C5-6.  DISCHARGE DIAGNOSIS:  Spondylosis with myelopathy at C4-5 and C5-6.  PROCEDURE:  C4-5 and C5-6 anterior cervical diskectomy and fusion.  COMPLICATIONS:  None.  CONDITION ON DISCHARGE:  Well.  HISTORY OF PRESENT ILLNESS:  A 77 year old right-handed white lady whose history and physical is recanted in the chart.  She has been having tingling in her hands.  MRI showed spondylosis and she is referred to me.  Medical history is remarkable for adult onset diabetes.  General examination is unremarkable.  Neurological examination showed positive Lhermittes. Hoffmans was negative.  Strength is full.  HOSPITAL COURSE:  She was admitted after ascertainment of normal laboratory values and underwent a 4-5 and 5-6 anterior cervical diskectomy and fusion. Postoperatively, she has done well.  She spent one night in the ICU because of her age.  Her incision is dry and clean.  She is being discharged home with full strength.  She is being given Vicodin for pain.  Her follow-up will be in the [facility] office in two weeks for a lateral C-spine. Dictated by:   Payton Doughty, M.D. Attending Physician:  Emeterio Reeve DD:  11/05/01 TD:  11/08/01 Job: [id number]

## 2011-02-19 ENCOUNTER — Other Ambulatory Visit: Payer: Self-pay | Admitting: Obstetrics & Gynecology

## 2011-02-19 DIAGNOSIS — R634 Abnormal weight loss: Secondary | ICD-10-CM

## 2011-02-20 ENCOUNTER — Ambulatory Visit
Admission: RE | Admit: 2011-02-20 | Discharge: 2011-02-20 | Disposition: A | Discharge status: Home / Self Care | Payer: Medicare Other | Source: Ambulatory Visit | Attending: Obstetrics & Gynecology | Admitting: Obstetrics & Gynecology

## 2011-02-20 DIAGNOSIS — R634 Abnormal weight loss: Secondary | ICD-10-CM

## 2011-03-28 LAB — BASIC METABOLIC PANEL
BUN: 11
BUN: 13
BUN: 9
CO2: 27
CO2: 29
CO2: 30
Calcium: 11.3 — ABNORMAL HIGH
Calcium: 9.6
Calcium: 9.7
Chloride: 102
Chloride: 105
Chloride: 105
Creatinine, Ser: 0.62
Creatinine, Ser: 0.69
Creatinine, Ser: 0.71
GFR calc Af Amer: >60
GFR calc Af Amer: >60
GFR calc Af Amer: >60
GFR calc non Af Amer: >60
GFR calc non Af Amer: >60
GFR calc non Af Amer: >60
Glucose, Bld: 100 — ABNORMAL HIGH
Glucose, Bld: 105 — ABNORMAL HIGH
Glucose, Bld: 111 — ABNORMAL HIGH
Potassium: 4.2
Potassium: 4.3
Potassium: 4.4
Sodium: 135
Sodium: 137
Sodium: 138

## 2011-03-28 LAB — DIFFERENTIAL
Basophils Absolute: 0
Basophils Relative: 1
Eosinophils Absolute: 0.1
Eosinophils Relative: 2
Lymphocytes Relative: 30
Lymphs Abs: 1.6
Monocytes Absolute: 0.3
Monocytes Relative: 6
Neutro Abs: 3.4
Neutrophils Relative %: 60

## 2011-03-28 LAB — CBC
HCT: 25.9 — ABNORMAL LOW
HCT: 29.7 — ABNORMAL LOW
HCT: 38.4
Hemoglobin: 10.2 — ABNORMAL LOW
Hemoglobin: 13.1
Hemoglobin: 9 — ABNORMAL LOW
MCHC: 34.2
MCHC: 34.3
MCHC: 34.7
MCV: 93.5
MCV: 94.3
MCV: 94.4
Platelets: 173
Platelets: 180
Platelets: 252
RBC: 2.74 — ABNORMAL LOW
RBC: 3.14 — ABNORMAL LOW
RBC: 4.11
RDW: 12.7
RDW: 13.3
RDW: 13.6
WBC: 5.4
WBC: 7.2
WBC: 7.4

## 2011-03-28 LAB — GLUCOSE, CAPILLARY
Glucose-Capillary: 107 — ABNORMAL HIGH
Glucose-Capillary: 108 — ABNORMAL HIGH
Glucose-Capillary: 109 — ABNORMAL HIGH
Glucose-Capillary: 112 — ABNORMAL HIGH
Glucose-Capillary: 114 — ABNORMAL HIGH
Glucose-Capillary: 117 — ABNORMAL HIGH
Glucose-Capillary: 119 — ABNORMAL HIGH
Glucose-Capillary: 128 — ABNORMAL HIGH
Glucose-Capillary: 134 — ABNORMAL HIGH
Glucose-Capillary: 145 — ABNORMAL HIGH
Glucose-Capillary: 209 — ABNORMAL HIGH

## 2011-03-28 LAB — URINALYSIS, ROUTINE W REFLEX MICROSCOPIC
Bilirubin Urine: NEGATIVE
Glucose, UA: NEGATIVE
Hgb urine dipstick: NEGATIVE
Ketones, ur: NEGATIVE
Nitrite: NEGATIVE
Protein, ur: NEGATIVE
Specific Gravity, Urine: 1.021
Urobilinogen, UA: 0.2
pH: 5.5

## 2011-03-28 LAB — PROTIME-INR
INR: 1
Prothrombin Time: 13

## 2011-03-28 LAB — TYPE AND SCREEN
ABO/RH(D): O POS
Antibody Screen: NEGATIVE

## 2011-03-28 LAB — APTT: aPTT: 31

## 2011-03-28 LAB — ABO/RH: ABO/RH(D): O POS

## 2011-04-15 ENCOUNTER — Other Ambulatory Visit: Payer: Self-pay | Admitting: Internal Medicine

## 2011-04-15 DIAGNOSIS — Z1231 Encounter for screening mammogram for malignant neoplasm of breast: Secondary | ICD-10-CM

## 2011-05-08 ENCOUNTER — Ambulatory Visit
Admission: RE | Admit: 2011-05-08 | Discharge: 2011-05-08 | Disposition: A | Discharge status: Home / Self Care | Payer: Medicare Other | Source: Ambulatory Visit | Attending: Internal Medicine | Admitting: Internal Medicine

## 2011-05-08 DIAGNOSIS — Z1231 Encounter for screening mammogram for malignant neoplasm of breast: Secondary | ICD-10-CM

## 2011-07-08 DIAGNOSIS — E785 Hyperlipidemia, unspecified: Secondary | ICD-10-CM | POA: Diagnosis not present

## 2011-07-08 DIAGNOSIS — I10 Essential (primary) hypertension: Secondary | ICD-10-CM | POA: Diagnosis not present

## 2011-07-08 DIAGNOSIS — E039 Hypothyroidism, unspecified: Secondary | ICD-10-CM | POA: Diagnosis not present

## 2011-07-08 DIAGNOSIS — E559 Vitamin D deficiency, unspecified: Secondary | ICD-10-CM | POA: Diagnosis not present

## 2011-07-24 DIAGNOSIS — I10 Essential (primary) hypertension: Secondary | ICD-10-CM | POA: Diagnosis not present

## 2011-07-24 DIAGNOSIS — E213 Hyperparathyroidism, unspecified: Secondary | ICD-10-CM | POA: Diagnosis not present

## 2011-07-24 DIAGNOSIS — K2100 Gastro-esophageal reflux disease with esophagitis, without bleeding: Secondary | ICD-10-CM | POA: Diagnosis not present

## 2011-07-24 DIAGNOSIS — E785 Hyperlipidemia, unspecified: Secondary | ICD-10-CM | POA: Diagnosis not present

## 2011-07-24 DIAGNOSIS — E119 Type 2 diabetes mellitus without complications: Secondary | ICD-10-CM | POA: Diagnosis not present

## 2011-08-25 DIAGNOSIS — R059 Cough, unspecified: Secondary | ICD-10-CM | POA: Diagnosis not present

## 2011-08-25 DIAGNOSIS — J0110 Acute frontal sinusitis, unspecified: Secondary | ICD-10-CM | POA: Diagnosis not present

## 2011-09-09 ENCOUNTER — Emergency Department (HOSPITAL_COMMUNITY): Payer: Medicare Other

## 2011-09-09 ENCOUNTER — Emergency Department (HOSPITAL_COMMUNITY)
Admission: EM | Admit: 2011-09-09 | Discharge: 2011-09-09 | Disposition: A | Discharge status: Home / Self Care | Payer: Medicare Other | Attending: Emergency Medicine | Admitting: Emergency Medicine

## 2011-09-09 DIAGNOSIS — R5381 Other malaise: Secondary | ICD-10-CM | POA: Insufficient documentation

## 2011-09-09 DIAGNOSIS — R5383 Other fatigue: Secondary | ICD-10-CM | POA: Diagnosis not present

## 2011-09-09 DIAGNOSIS — R05 Cough: Secondary | ICD-10-CM | POA: Insufficient documentation

## 2011-09-09 DIAGNOSIS — J209 Acute bronchitis, unspecified: Secondary | ICD-10-CM | POA: Diagnosis not present

## 2011-09-09 DIAGNOSIS — R509 Fever, unspecified: Secondary | ICD-10-CM | POA: Diagnosis not present

## 2011-09-09 DIAGNOSIS — R059 Cough, unspecified: Secondary | ICD-10-CM | POA: Diagnosis not present

## 2011-09-09 DIAGNOSIS — J40 Bronchitis, not specified as acute or chronic: Secondary | ICD-10-CM | POA: Insufficient documentation

## 2011-09-09 LAB — BASIC METABOLIC PANEL
BUN: 15 mg/dL (ref 6–23)
CO2: 26 mEq/L (ref 19–32)
Calcium: 11.9 mg/dL — ABNORMAL HIGH (ref 8.4–10.5)
Chloride: 99 mEq/L (ref 96–112)
Creatinine, Ser: 0.68 mg/dL (ref 0.50–1.10)
GFR calc Af Amer: >90 mL/min (ref >90)
GFR calc non Af Amer: 78 mL/min — ABNORMAL LOW (ref >90)
Glucose, Bld: 132 mg/dL — ABNORMAL HIGH (ref 70–99)
Potassium: 4.2 mEq/L (ref 3.5–5.1)
Sodium: 137 mEq/L (ref 135–145)

## 2011-09-09 LAB — CBC
HCT: 38.3 % (ref 36.0–46.0)
Hemoglobin: 13.6 g/dL (ref 12.0–15.0)
MCH: 31 pg (ref 26.0–34.0)
MCHC: 35.5 g/dL (ref 30.0–36.0)
MCV: 87.2 fL (ref 78.0–100.0)
Platelets: 320 10*3/uL (ref 150–400)
RBC: 4.39 MIL/uL (ref 3.87–5.11)
RDW: 12.9 % (ref 11.5–15.5)
WBC: 10.2 10*3/uL (ref 4.0–10.5)

## 2011-09-09 LAB — DIFFERENTIAL
Basophils Absolute: 0 K/uL (ref 0.0–0.1)
Basophils Relative: 0 % (ref 0–1)
Eosinophils Absolute: 0.1 K/uL (ref 0.0–0.7)
Eosinophils Relative: 1 % (ref 0–5)
Lymphocytes Relative: 15 % (ref 12–46)
Lymphs Abs: 1.6 K/uL (ref 0.7–4.0)
Monocytes Absolute: 0.5 K/uL (ref 0.1–1.0)
Monocytes Relative: 5 % (ref 3–12)
Neutro Abs: 8 K/uL — ABNORMAL HIGH (ref 1.7–7.7)
Neutrophils Relative %: 78 % — ABNORMAL HIGH (ref 43–77)

## 2011-09-09 MED ORDER — PREDNISONE 20 MG PO TABS: 20.0000 mg | ORAL_TABLET | Freq: Every day | ORAL | Status: DC

## 2011-09-09 MED ORDER — ALBUTEROL SULFATE HFA 108 (90 BASE) MCG/ACT IN AERS
2.0000 | INHALATION_SPRAY | RESPIRATORY_TRACT | Status: DC | PRN
  Administered 2011-09-09: 2 via RESPIRATORY_TRACT
  Filled 2011-09-09: qty 6.7

## 2011-09-09 MED ORDER — BENZONATATE 100 MG PO CAPS
100.0000 mg | ORAL_CAPSULE | Freq: Two times a day (BID) | ORAL | Status: DC | PRN
  Administered 2011-09-09: 100 mg via ORAL
  Filled 2011-09-09: qty 1

## 2011-09-09 MED ORDER — METHYLPREDNISOLONE SODIUM SUCC 125 MG IJ SOLR
125.0000 mg | Freq: Once | INTRAMUSCULAR | Status: AC
  Administered 2011-09-09: 125 mg via INTRAVENOUS
  Filled 2011-09-09: qty 2

## 2011-09-09 MED ORDER — SODIUM CHLORIDE 0.9 % IV SOLN: INTRAVENOUS | Status: DC

## 2011-09-09 MED ORDER — ALBUTEROL SULFATE HFA 108 (90 BASE) MCG/ACT IN AERS: 1.0000 | INHALATION_SPRAY | RESPIRATORY_TRACT | Status: AC | PRN

## 2011-09-09 MED ORDER — BENZONATATE 100 MG PO CAPS: 100.0000 mg | ORAL_CAPSULE | Freq: Two times a day (BID) | ORAL | Status: AC | PRN

## 2011-09-09 NOTE — ED Provider Notes (Signed)
History     CSN: [id number]  Arrival date & time 09/09/11  4098   First MD Initiated Contact with Patient 09/09/11 [phone]      Chief Complaint  Patient presents with  . Cough    (Consider location/radiation/quality/duration/timing/severity/associated sxs/prior treatment) HPI Comments: The patient presents for evaluation of 3 weeks of nonproductive cough, frequent, without fever, chills, chest pain, palpitations, wheezing, or shortness of breath. She had seen her primary care physician 2 weeks ago and was prescribed amoxicillin for same, finished the course of antibiotics, and has not had relief of her symptoms. She returns for evaluation of persistent cough.  Patient is a 78 y.o. female presenting with cough. The history is provided by the patient.  Cough Chronicity:  Persistent over the last 3 weeks. Episode onset: 3 weeks ago, the patient was evaluated for this complaint 2 weeks ago and prescribed amoxicillin which she has completed without relief of her symptoms. The problem occurs every few minutes. The problem has not changed since onset.The cough is non-productive. There has been no fever. Pertinent negatives include no chest pain, no chills, no sweats, no ear congestion, no ear pain, no headaches, no rhinorrhea, no sore throat, no myalgias, no shortness of breath, no wheezing and no eye redness. Associated symptoms comments: Nasal congestion without sinus pressure or postnasal drip. Treatments tried: Prior treatment with amoxicillin 2 weeks ago without relief of symptoms. The treatment provided no relief. She is not a smoker. Her past medical history does not include COPD, emphysema or asthma.    No past medical history on file.  No past surgical history on file.  No family history on file.  History  Substance Use Topics  . Smoking status: Not on file  . Smokeless tobacco: Not on file  . Alcohol Use: Not on file    OB History    No data available      Review of Systems    Constitutional: Positive for fatigue. Negative for fever, chills, diaphoresis, activity change, appetite change and unexpected weight change.  HENT: Negative for hearing loss, ear pain, nosebleeds, congestion, sore throat, rhinorrhea, mouth sores, neck pain, neck stiffness, dental problem, postnasal drip and ear discharge.   Eyes: Negative for photophobia, pain, discharge and redness.  Respiratory: Positive for cough. Negative for choking, shortness of breath, wheezing and stridor.   Cardiovascular: Negative for chest pain, palpitations and leg swelling.  Gastrointestinal: Negative for nausea, vomiting, abdominal pain and diarrhea.  Genitourinary: Negative for dysuria and flank pain.  Musculoskeletal: Negative.  Negative for myalgias.  Skin: Negative for color change, pallor, rash and wound.  Neurological: Negative for dizziness, syncope, weakness, light-headedness, numbness and headaches.  Hematological: Negative for adenopathy.  Psychiatric/Behavioral: Negative.     Allergies  Bactrim; Betadine; Nutmeg oil (myristica oil); and Sulfa antibiotics  Home Medications   Current Outpatient Rx  Name Route Sig Dispense Refill  . AMOXICILLIN 875 MG PO TABS Oral Take 875 mg by mouth 2 (two) times daily.    Marland Kitchen HYDROCODONE-HOMATROPINE 5-1.5 MG/5ML PO SYRP Oral Take 2.5-5 mLs by mouth every 4 (four) hours as needed. cough    . LOSARTAN POTASSIUM 50 MG PO TABS Oral Take 50 mg by mouth daily.    Marland Kitchen METFORMIN HCL 500 MG PO TABS Oral Take 500 mg by mouth daily with breakfast.    . ICAPS MV PO Oral Take 2 tablets by mouth daily.    Marland Kitchen NAPROXEN SODIUM 220 MG PO TABS Oral Take 220 mg by mouth 2 (  two) times daily with a meal.      BP 178/83  Pulse 99  Temp(Src) 97.3 F (36.3 C) (Oral)  Resp 18  Ht 5\' 2"  (1.575 m)  Wt 110 lb (49.896 kg)  BMI 20.12 kg/m2  SpO2 100%  Physical Exam  Nursing note and vitals reviewed. Constitutional: She is oriented to person, place, and time. She appears  well-developed and well-nourished. No distress.  HENT:  Head: Normocephalic and atraumatic.  Right Ear: External ear normal.  Left Ear: External ear normal.  Nose: Nose normal.  Mouth/Throat: Oropharynx is clear and moist.  Eyes: Conjunctivae and EOM are normal. Pupils are equal, round, and reactive to light.  Neck: Normal range of motion. Neck supple. No JVD present. No tracheal deviation present.  Cardiovascular: Normal rate, regular rhythm, normal heart sounds and intact distal pulses.  Exam reveals no gallop and no friction rub.   No murmur heard. Pulmonary/Chest: Effort normal. No accessory muscle usage or stridor. Not tachypneic. No respiratory distress. She has no decreased breath sounds. She has wheezes in the right upper field, the right middle field, the left upper field and the left middle field. She has no rhonchi. She has no rales. She exhibits no tenderness.  Abdominal: Soft. Bowel sounds are normal. She exhibits no distension. There is no tenderness. There is no rebound and no guarding.  Musculoskeletal: Normal range of motion. She exhibits no edema and no tenderness.  Lymphadenopathy:    She has no cervical adenopathy.  Neurological: She is alert and oriented to person, place, and time. She has normal reflexes. No cranial nerve deficit. She exhibits normal muscle tone. Coordination normal.  Skin: Skin is warm and dry. No rash noted. She is not diaphoretic. No erythema. No pallor.  Psychiatric: She has a normal mood and affect. Her behavior is normal. Judgment and thought content normal.    ED Course  Procedures (including critical care time)  Labs Reviewed  DIFFERENTIAL - Abnormal; Notable for the following:    Neutrophils Relative 78 (*)    Neutro Abs 8.0 (*)    All other components within normal limits  BASIC METABOLIC PANEL - Abnormal; Notable for the following:    Glucose, Bld 132 (*)    Calcium 11.9 (*)    GFR calc non Af Amer 78 (*)    All other components  within normal limits  CBC   Dg Chest 2 View  09/09/2011  *RADIOLOGY REPORT*  Clinical Data: Cough for 3 weeks.  CHEST - 2 VIEW  Comparison: 06/17/2010  Findings: Heart size appears normal.  No pleural effusion or pulmonary edema. Lungs are hyperinflated but clear.  There is a scoliosis deformity affecting the thoracic and lumbar spine.  Chronic thoracic compression deformities are noted which have been treated with bone cement.  No new compression fractures identified.  IMPRESSION:  1. No acute cardiopulmonary abnormalities.  Original Report Authenticated By: Rosealee Albee, M.D.     No diagnosis found.    MDM  I reviewed the patient's x-ray, and there is no apparent focal infiltrate to suggest pneumonia. Her lungs do show increased volume suggestive of COPD, although the patient is not a smoker and does not have an established history of COPD. The patient at this time is having improvement of symptoms with bronchodilator by albuterol inhaler, steroid, and antitussives. Her oxygen saturations are normal and her respirations are unlabored. She has no fever, no significant leukocytosis, no anemia, no significant electrolyte abnormality. I will discharge patient home  with treatment for bronchitis to follow up with her primary care physician as needed.       Felisa Bonier, MD 09/09/11 1125

## 2011-09-09 NOTE — ED Notes (Signed)
VHQ:[id number]<EX> Expected date:09/09/11<BR> Expected time: 9:24 AM<BR> Means of arrival:<BR> Comments:<BR> Flu like symptoms, 86

## 2011-09-09 NOTE — ED Notes (Signed)
Pt is at [facility] assisted living. Has had flu like s/s x 10 days. Finished zithromax last week. Still has cough

## 2011-09-09 NOTE — Discharge Instructions (Signed)
Acute Bronchitis You have acute bronchitis. This means you have a chest cold. The airways in your lungs are red and sore (inflamed). Acute means it is sudden onset.  CAUSES Bronchitis is most often caused by the same virus that causes a cold. SYMPTOMS   Body aches.   Chest congestion.   Chills.   Cough.   Fever.   Shortness of breath.   Sore throat.  TREATMENT  Acute bronchitis is usually treated with rest, fluids, and medicines for relief of fever or cough. Most symptoms should go away after a few days or a week. Increased fluids may help thin your secretions and will prevent dehydration. Your caregiver may give you an inhaler to improve your symptoms. The inhaler reduces shortness of breath and helps control cough. You can take over-the-counter pain relievers or cough medicine to decrease coughing, pain, or fever. A cool-air vaporizer may help thin bronchial secretions and make it easier to clear your chest. Antibiotics are usually not needed but can be prescribed if you smoke, are seriously ill, have chronic lung problems, are elderly, or you are at higher risk for developing complications.Allergies and asthma can make bronchitis worse. Repeated episodes of bronchitis may cause longstanding lung problems. Avoid smoking and secondhand smoke.Exposure to cigarette smoke or irritating chemicals will make bronchitis worse. If you are a cigarette smoker, consider using nicotine gum or skin patches to help control withdrawal symptoms. Quitting smoking will help your lungs heal faster. Recovery from bronchitis is often slow, but you should start feeling better after 2 to 3 days. Cough from bronchitis frequently lasts for 3 to 4 weeks. To prevent another bout of acute bronchitis:  Quit smoking.   Wash your hands frequently to get rid of viruses or use a hand sanitizer.   Avoid other people with cold or virus symptoms.   Try not to touch your hands to your mouth, nose, or eyes.  SEEK  IMMEDIATE MEDICAL CARE IF:  You develop increased fever, chills, or chest pain.   You have severe shortness of breath or bloody sputum.   You develop dehydration, fainting, repeated vomiting, or a severe headache.   You have no improvement after 1 week of treatment or you get worse.  MAKE SURE YOU:   Understand these instructions.   Will watch your condition.   Will get help right away if you are not doing well or get worse.  Document Released: 07/24/2004 Document Revised: 06/05/2011 Document Reviewed: 10/09/2010 [facility] Patient Information 2012 [place], [place].Antibiotic Nonuse  Your caregiver felt that the infection or problem was not one that would be helped with an antibiotic. Infections may be caused by viruses or bacteria. Only a caregiver can tell which one of these is the likely cause of an illness. A cold is the most common cause of infection in both adults and children. A cold is a virus. Antibiotic treatment will have no effect on a viral infection. Viruses can lead to many lost days of work caring for sick children and many missed days of school. Children may catch as many as 10 "colds" or "flus" per year during which they can be tearful, cranky, and uncomfortable. The goal of treating a virus is aimed at keeping the ill person comfortable. Antibiotics are medications used to help the body fight bacterial infections. There are relatively few types of bacteria that cause infections but there are hundreds of viruses. While both viruses and bacteria cause infection they are very different types of germs. A viral infection  will typically go away by itself within 7 to 10 days. Bacterial infections may spread or get worse without antibiotic treatment. Examples of bacterial infections are:  Sore throats (like strep throat or tonsillitis).   Infection in the lung (pneumonia).   Ear and skin infections.  Examples of viral infections are:  Colds or flus.   Most coughs and  bronchitis.   Sore throats not caused by Strep.   Runny noses.  It is often best not to take an antibiotic when a viral infection is the cause of the problem. Antibiotics can kill off the helpful bacteria that we have inside our body and allow harmful bacteria to start growing. Antibiotics can cause side effects such as allergies, nausea, and diarrhea without helping to improve the symptoms of the viral infection. Additionally, repeated uses of antibiotics can cause bacteria inside of our body to become resistant. That resistance can be passed onto harmful bacterial. The next time you have an infection it may be harder to treat if antibiotics are used when they are not needed. Not treating with antibiotics allows our own immune system to develop and take care of infections more efficiently. Also, antibiotics will work better for [place] when they are prescribed for bacterial infections. Treatments for a child that is ill may include:  Give extra fluids throughout the day to stay hydrated.   Get plenty of rest.   Only give your child over-the-counter or prescription medicines for pain, discomfort, or fever as directed by your caregiver.   The use of a cool mist humidifier may help stuffy noses.   Cold medications if suggested by your caregiver.  Your caregiver may decide to start you on an antibiotic if:  The problem you were seen for today continues for a longer length of time than expected.   You develop a secondary bacterial infection.  SEEK MEDICAL CARE IF:  Fever lasts longer than 5 days.   Symptoms continue to get worse after 5 to 7 days or become severe.   Difficulty in breathing develops.   Signs of dehydration develop (poor drinking, rare urinating, dark colored urine).   Changes in behavior or worsening tiredness (listlessness or lethargy).  Document Released: 08/25/2001 Document Revised: 06/05/2011 Document Reviewed: 02/21/2009 [facility] Patient Information 2012 [place],  [place].Bronchitis Bronchitis is the body's way of reacting to injury and/or infection (inflammation) of the bronchi. Bronchi are the air tubes that extend from the windpipe into the lungs. If the inflammation becomes severe, it may cause shortness of breath. CAUSES  Inflammation may be caused by:  A virus.   Germs (bacteria).   Dust.   Allergens.   Pollutants and many other irritants.  The cells lining the bronchial tree are covered with tiny hairs (cilia). These constantly beat upward, away from the lungs, toward the mouth. This keeps the lungs free of pollutants. When these cells become too irritated and are unable to do their job, mucus begins to develop. This causes the characteristic cough of bronchitis. The cough clears the lungs when the cilia are unable to do their job. Without either of these protective mechanisms, the mucus would settle in the lungs. Then you would develop pneumonia. Smoking is a common cause of bronchitis and can contribute to pneumonia. Stopping this habit is the single most important thing you can do to help yourself. TREATMENT   Your caregiver may prescribe an antibiotic if the cough is caused by bacteria. Also, medicines that open up your airways make it easier to breathe.  Your caregiver may also recommend or prescribe an expectorant. It will loosen the mucus to be coughed up. Only take over-the-counter or prescription medicines for pain, discomfort, or fever as directed by your caregiver.   Removing whatever causes the problem (smoking, for example) is critical to preventing the problem from getting worse.   Cough suppressants may be prescribed for relief of cough symptoms.   Inhaled medicines may be prescribed to help with symptoms now and to help prevent problems from returning.   For those with recurrent (chronic) bronchitis, there may be a need for steroid medicines.  SEEK IMMEDIATE MEDICAL CARE IF:   During treatment, you develop more pus-like mucus  (purulent sputum).   You have a fever.   Your baby is older than 3 months with a rectal temperature of 102 F (38.9 C) or higher.   Your baby is 53 months old or younger with a rectal temperature of 100.4 F (38 C) or higher.   You become progressively more ill.   You have increased difficulty breathing, wheezing, or shortness of breath.  It is necessary to seek immediate medical care if you are elderly or sick from any other disease. MAKE SURE YOU:   Understand these instructions.   Will watch your condition.   Will get help right away if you are not doing well or get worse.  Document Released: 06/16/2005 Document Revised: 06/05/2011 Document Reviewed: 04/25/2008 [facility] Patient Information 2012 [place], [place].

## 2011-09-11 DIAGNOSIS — I10 Essential (primary) hypertension: Secondary | ICD-10-CM | POA: Diagnosis not present

## 2011-09-11 DIAGNOSIS — J209 Acute bronchitis, unspecified: Secondary | ICD-10-CM | POA: Diagnosis not present

## 2011-09-11 DIAGNOSIS — E119 Type 2 diabetes mellitus without complications: Secondary | ICD-10-CM | POA: Diagnosis not present

## 2011-09-18 DIAGNOSIS — E119 Type 2 diabetes mellitus without complications: Secondary | ICD-10-CM | POA: Diagnosis not present

## 2011-09-18 DIAGNOSIS — I10 Essential (primary) hypertension: Secondary | ICD-10-CM | POA: Diagnosis not present

## 2011-09-18 DIAGNOSIS — E785 Hyperlipidemia, unspecified: Secondary | ICD-10-CM | POA: Diagnosis not present

## 2011-09-25 DIAGNOSIS — R635 Abnormal weight gain: Secondary | ICD-10-CM | POA: Diagnosis not present

## 2011-09-25 DIAGNOSIS — I10 Essential (primary) hypertension: Secondary | ICD-10-CM | POA: Diagnosis not present

## 2011-09-25 DIAGNOSIS — E119 Type 2 diabetes mellitus without complications: Secondary | ICD-10-CM | POA: Diagnosis not present

## 2011-10-07 DIAGNOSIS — E119 Type 2 diabetes mellitus without complications: Secondary | ICD-10-CM | POA: Diagnosis not present

## 2011-10-09 DIAGNOSIS — E119 Type 2 diabetes mellitus without complications: Secondary | ICD-10-CM | POA: Diagnosis not present

## 2011-10-09 DIAGNOSIS — R634 Abnormal weight loss: Secondary | ICD-10-CM | POA: Diagnosis not present

## 2011-10-09 DIAGNOSIS — I10 Essential (primary) hypertension: Secondary | ICD-10-CM | POA: Diagnosis not present

## 2011-10-14 DIAGNOSIS — H1045 Other chronic allergic conjunctivitis: Secondary | ICD-10-CM | POA: Diagnosis not present

## 2011-11-04 DIAGNOSIS — E785 Hyperlipidemia, unspecified: Secondary | ICD-10-CM | POA: Diagnosis not present

## 2011-11-04 DIAGNOSIS — I10 Essential (primary) hypertension: Secondary | ICD-10-CM | POA: Diagnosis not present

## 2011-11-04 DIAGNOSIS — E119 Type 2 diabetes mellitus without complications: Secondary | ICD-10-CM | POA: Diagnosis not present

## 2012-01-08 DIAGNOSIS — E213 Hyperparathyroidism, unspecified: Secondary | ICD-10-CM | POA: Diagnosis not present

## 2012-01-08 DIAGNOSIS — R059 Cough, unspecified: Secondary | ICD-10-CM | POA: Diagnosis not present

## 2012-01-08 DIAGNOSIS — M545 Low back pain, unspecified: Secondary | ICD-10-CM | POA: Diagnosis not present

## 2012-01-08 DIAGNOSIS — E119 Type 2 diabetes mellitus without complications: Secondary | ICD-10-CM | POA: Diagnosis not present

## 2012-01-08 DIAGNOSIS — R05 Cough: Secondary | ICD-10-CM | POA: Diagnosis not present

## 2012-01-08 DIAGNOSIS — E785 Hyperlipidemia, unspecified: Secondary | ICD-10-CM | POA: Diagnosis not present

## 2012-01-08 DIAGNOSIS — I10 Essential (primary) hypertension: Secondary | ICD-10-CM | POA: Diagnosis not present

## 2012-02-25 DIAGNOSIS — IMO0002 Reserved for concepts with insufficient information to code with codable children: Secondary | ICD-10-CM | POA: Diagnosis not present

## 2012-03-04 DIAGNOSIS — Z124 Encounter for screening for malignant neoplasm of cervix: Secondary | ICD-10-CM | POA: Diagnosis not present

## 2012-03-04 DIAGNOSIS — Z01419 Encounter for gynecological examination (general) (routine) without abnormal findings: Secondary | ICD-10-CM | POA: Diagnosis not present

## 2012-03-05 DIAGNOSIS — H10019 Acute follicular conjunctivitis, unspecified eye: Secondary | ICD-10-CM | POA: Diagnosis not present

## 2012-03-08 DIAGNOSIS — H10019 Acute follicular conjunctivitis, unspecified eye: Secondary | ICD-10-CM | POA: Diagnosis not present

## 2012-03-09 ENCOUNTER — Other Ambulatory Visit: Payer: Self-pay | Admitting: Obstetrics & Gynecology

## 2012-03-09 DIAGNOSIS — Z1231 Encounter for screening mammogram for malignant neoplasm of breast: Secondary | ICD-10-CM

## 2012-03-09 DIAGNOSIS — IMO0002 Reserved for concepts with insufficient information to code with codable children: Secondary | ICD-10-CM | POA: Diagnosis not present

## 2012-03-12 DIAGNOSIS — IMO0002 Reserved for concepts with insufficient information to code with codable children: Secondary | ICD-10-CM | POA: Diagnosis not present

## 2012-03-15 DIAGNOSIS — IMO0002 Reserved for concepts with insufficient information to code with codable children: Secondary | ICD-10-CM | POA: Diagnosis not present

## 2012-03-17 DIAGNOSIS — M545 Low back pain, unspecified: Secondary | ICD-10-CM | POA: Diagnosis not present

## 2012-03-22 DIAGNOSIS — M545 Low back pain, unspecified: Secondary | ICD-10-CM | POA: Diagnosis not present

## 2012-03-24 DIAGNOSIS — M545 Low back pain, unspecified: Secondary | ICD-10-CM | POA: Diagnosis not present

## 2012-03-29 DIAGNOSIS — M545 Low back pain, unspecified: Secondary | ICD-10-CM | POA: Diagnosis not present

## 2012-03-31 DIAGNOSIS — M545 Low back pain, unspecified: Secondary | ICD-10-CM | POA: Diagnosis not present

## 2012-04-06 DIAGNOSIS — H353190 Nonexudative age-related macular degeneration, unspecified eye, stage unspecified: Secondary | ICD-10-CM | POA: Diagnosis not present

## 2012-04-22 DIAGNOSIS — L259 Unspecified contact dermatitis, unspecified cause: Secondary | ICD-10-CM | POA: Diagnosis not present

## 2012-04-22 DIAGNOSIS — H16229 Keratoconjunctivitis sicca, not specified as Sjogren's, unspecified eye: Secondary | ICD-10-CM | POA: Diagnosis not present

## 2012-04-22 DIAGNOSIS — I10 Essential (primary) hypertension: Secondary | ICD-10-CM | POA: Diagnosis not present

## 2012-04-22 DIAGNOSIS — M545 Low back pain, unspecified: Secondary | ICD-10-CM | POA: Diagnosis not present

## 2012-04-22 DIAGNOSIS — E119 Type 2 diabetes mellitus without complications: Secondary | ICD-10-CM | POA: Diagnosis not present

## 2012-04-22 DIAGNOSIS — E785 Hyperlipidemia, unspecified: Secondary | ICD-10-CM | POA: Diagnosis not present

## 2012-04-27 DIAGNOSIS — E039 Hypothyroidism, unspecified: Secondary | ICD-10-CM | POA: Diagnosis not present

## 2012-04-27 DIAGNOSIS — E119 Type 2 diabetes mellitus without complications: Secondary | ICD-10-CM | POA: Diagnosis not present

## 2012-04-27 DIAGNOSIS — E785 Hyperlipidemia, unspecified: Secondary | ICD-10-CM | POA: Diagnosis not present

## 2012-05-10 ENCOUNTER — Ambulatory Visit: Payer: Medicare Other

## 2012-05-10 DIAGNOSIS — Z23 Encounter for immunization: Secondary | ICD-10-CM | POA: Diagnosis not present

## 2012-05-20 ENCOUNTER — Ambulatory Visit
Admission: RE | Admit: 2012-05-20 | Discharge: 2012-05-20 | Disposition: A | Discharge status: Home / Self Care | Payer: Medicare Other | Source: Ambulatory Visit | Attending: Obstetrics & Gynecology | Admitting: Obstetrics & Gynecology

## 2012-05-20 DIAGNOSIS — Z1231 Encounter for screening mammogram for malignant neoplasm of breast: Secondary | ICD-10-CM

## 2012-08-17 DIAGNOSIS — I10 Essential (primary) hypertension: Secondary | ICD-10-CM | POA: Diagnosis not present

## 2012-08-17 DIAGNOSIS — E213 Hyperparathyroidism, unspecified: Secondary | ICD-10-CM | POA: Diagnosis not present

## 2012-08-17 DIAGNOSIS — E785 Hyperlipidemia, unspecified: Secondary | ICD-10-CM | POA: Diagnosis not present

## 2012-08-17 DIAGNOSIS — E119 Type 2 diabetes mellitus without complications: Secondary | ICD-10-CM | POA: Diagnosis not present

## 2012-08-19 DIAGNOSIS — M545 Low back pain, unspecified: Secondary | ICD-10-CM | POA: Diagnosis not present

## 2012-08-19 DIAGNOSIS — I10 Essential (primary) hypertension: Secondary | ICD-10-CM | POA: Diagnosis not present

## 2012-08-19 DIAGNOSIS — E119 Type 2 diabetes mellitus without complications: Secondary | ICD-10-CM | POA: Diagnosis not present

## 2012-08-19 DIAGNOSIS — E785 Hyperlipidemia, unspecified: Secondary | ICD-10-CM | POA: Diagnosis not present

## 2012-08-19 DIAGNOSIS — K2100 Gastro-esophageal reflux disease with esophagitis, without bleeding: Secondary | ICD-10-CM | POA: Diagnosis not present

## 2012-08-27 ENCOUNTER — Ambulatory Visit (INDEPENDENT_AMBULATORY_CARE_PROVIDER_SITE_OTHER): Payer: Medicare Other | Admitting: Emergency Medicine

## 2012-08-27 ENCOUNTER — Ambulatory Visit: Payer: Medicare Other

## 2012-08-27 VITALS — BP 160/58 | HR 108 | Temp 98.1°F | Resp 16 | Ht 61.5 in | Wt 111.0 lb

## 2012-08-27 DIAGNOSIS — R059 Cough, unspecified: Secondary | ICD-10-CM

## 2012-08-27 DIAGNOSIS — B351 Tinea unguium: Secondary | ICD-10-CM | POA: Insufficient documentation

## 2012-08-27 DIAGNOSIS — R05 Cough: Secondary | ICD-10-CM | POA: Diagnosis not present

## 2012-08-27 DIAGNOSIS — E1142 Type 2 diabetes mellitus with diabetic polyneuropathy: Secondary | ICD-10-CM | POA: Insufficient documentation

## 2012-08-27 LAB — POCT CBC
Granulocyte percent: 76.3 %G (ref 37–80)
HCT, POC: 42.6 % (ref 37.7–47.9)
Hemoglobin: 13.4 g/dL (ref 12.2–16.2)
Lymph, poc: 1.9 (ref 0.6–3.4)
MCH, POC: 29.6 pg (ref 27–31.2)
MCHC: 31.5 g/dL — AB (ref 31.8–35.4)
MCV: 94 fL (ref 80–97)
MID (cbc): 0.6 (ref 0–0.9)
MPV: 8.6 fL (ref 0–99.8)
POC Granulocyte: 8.2 — AB (ref 2–6.9)
POC LYMPH PERCENT: 18 %L (ref 10–50)
POC MID %: 5.7 %M (ref 0–12)
Platelet Count, POC: 292 10*3/uL (ref 142–424)
RBC: 4.53 M/uL (ref 4.04–5.48)
RDW, POC: 14.1 %
WBC: 10.7 10*3/uL — AB (ref 4.6–10.2)

## 2012-08-27 MED ORDER — BENZONATATE 100 MG PO CAPS: 100.0000 mg | ORAL_CAPSULE | Freq: Three times a day (TID) | ORAL | Status: DC | PRN

## 2012-08-27 NOTE — Patient Instructions (Addendum)
Make sure you drink plenty of fluids to stay hydrated.  Take the tessalon perles as needed for cough.  If you start to develop increased mucus production or fever return to the clinic for re-evaluation.

## 2012-08-27 NOTE — Progress Notes (Signed)
Subjective:    Patient ID: [name], female    DOB: 1926/02/07, 79 y.o.   MRN: [id number]  HPI  A 79 year old female presents with cough for 3 days.    Pt admits to cough that is getting worse.  Having little mucus production.  Admits to nasal congestion, sick contacts, fatigue. States many people have been sick in her community.  Denies SOB, CP, palpitations, sore throat, myalgias, f/c, allergies, otalgia.   Her PCP is Dr. Frederik Pear.  She lives at [facility] where she was evaluated by an RN this morning who referred her to [facility].     Past Medical History  Diagnosis Date  . Diabetes mellitus without complication   . Osteoporosis     Past Surgical History  Procedure Laterality Date  . Appendectomy    . Spine surgery      vertebroplasty T10, T12  . Spine surgery      cervical fusion  . Joint replacement Bilateral     complete on right knee and partial on left knee    Prior to Admission medications   Medication Sig Start Date End Date Taking? Authorizing Provider  losartan (COZAAR) 50 MG tablet Take 50 mg by mouth daily.   Yes Historical Provider, MD  Multiple Vitamin (MULTI-VITAMIN PO) Take by mouth.   Yes Historical Provider, MD  Multiple Vitamins-Minerals (ICAPS MV PO) Take 2 tablets by mouth daily.   Yes Historical Provider, MD  albuterol (PROVENTIL HFA;VENTOLIN HFA) 108 (90 BASE) MCG/ACT inhaler Inhale 1-2 puffs into the lungs every 4 (four) hours as needed for wheezing or shortness of breath (cough). 09/09/11 09/08/12  Felisa Bonier, MD  amoxicillin (AMOXIL) 875 MG tablet Take 875 mg by mouth 2 (two) times daily.    Historical Provider, MD  benzonatate (TESSALON) 100 MG capsule Take 1-2 capsules (100-200 mg total) by mouth 3 (three) times daily as needed for cough. 08/27/12   Chelle S Jeffery, PA-C  HYDROcodone-homatropine (HYCODAN) 5-1.5 MG/5ML syrup Take 2.5-5 mLs by mouth every 4 (four) hours as needed. cough    Historical Provider, MD  metFORMIN (GLUCOPHAGE) 500 MG  tablet Take 500 mg by mouth daily with breakfast.    Historical Provider, MD  naproxen sodium (ANAPROX) 220 MG tablet Take 220 mg by mouth 2 (two) times daily with a meal.    Historical Provider, MD  predniSONE (DELTASONE) 20 MG tablet Take 1 tablet (20 mg total) by mouth daily. 09/09/11   Felisa Bonier, MD    Allergies  Allergen Reactions  . Bactrim     unknown  . Betadine (Povidone Iodine)     unknown  . Nutmeg Oil (Myristica Oil)     unknown  . Sulfa Antibiotics     unknown    History   Social History  . Marital Status: Widowed    Spouse Name: N/A    Number of Children: N/A  . Years of Education: N/A   Occupational History  . Not on file.   Social History Main Topics  . Smoking status: Never Smoker   . Smokeless tobacco: Not on file  . Alcohol Use: No  . Drug Use: No  . Sexually Active: No   Other Topics Concern  . Not on file   Social History Narrative  . No narrative on file    Family History  Problem Relation Age of Onset  . Diabetes Mother   . Stroke Father      Review of Systems  As above.  Objective:   Physical Exam  BP 160/58  Pulse 108  Temp(Src) 98.1 F (36.7 C) (Oral)  Resp 16  Ht 5' 1.5" (1.562 m)  Wt 111 lb (50.349 kg)  BMI 20.64 kg/m2  SpO2 96%   General:  Pleasant, thin female.  NAD. HEENT:  Positive cataracts.  No erythema or bulging of TMs, landmarks visible.  Pharynx pink and moist.  No erythema or edema of turbinates. Neck:  Supple.  No lymphadenopathy. Heart:  RRR.  Normal S1,S2.  No m/g/r. Lungs:  Initially decreased breath sounds on left side but clear after having pt cough.   Results for orders placed in visit on 08/27/12  POCT CBC      Result Value Range   WBC 10.7 (*) 4.6 - 10.2 K/uL   Lymph, poc 1.9  0.6 - 3.4   POC LYMPH PERCENT 18.0  10 - 50 %L   MID (cbc) 0.6  0 - 0.9   POC MID % 5.7  0 - 12 %M   POC Granulocyte 8.2 (*) 2 - 6.9   Granulocyte percent 76.3  37 - 80 %G   RBC 4.53  4.04 - 5.48 M/uL    Hemoglobin 13.4  12.2 - 16.2 g/dL   HCT, POC 16.1  09.6 - 47.9 %   MCV 94.0  80 - 97 fL   MCH, POC 29.6  27 - 31.2 pg   MCHC 31.5 (*) 31.8 - 35.4 g/dL   RDW, POC 04.5     Platelet Count, POC 292  142 - 424 K/uL   MPV 8.6  0 - 99.8 fL     [facility] reading (PRIMARY) by  Dr. Cleta Alberts.  CXR showed previous vertebroplasty and neck surgery.  No obvious infiltrates, increased AP diameter.      Assessment & Plan:  Diabetes  Cough - Plan: POCT CBC, DG Chest 2 View  Will cover cough with tessalon perles PRN.    Patient Instructions  Make sure you drink plenty of fluids to stay hydrated.  Take the tessalon perles as needed for cough.  If you start to develop increased mucus production or fever return to the clinic for re-evaluation.

## 2012-10-08 DIAGNOSIS — H353190 Nonexudative age-related macular degeneration, unspecified eye, stage unspecified: Secondary | ICD-10-CM | POA: Diagnosis not present

## 2012-10-08 DIAGNOSIS — H2510 Age-related nuclear cataract, unspecified eye: Secondary | ICD-10-CM | POA: Diagnosis not present

## 2012-11-09 DIAGNOSIS — M1710 Unilateral primary osteoarthritis, unspecified knee: Secondary | ICD-10-CM | POA: Diagnosis not present

## 2012-11-09 DIAGNOSIS — IMO0002 Reserved for concepts with insufficient information to code with codable children: Secondary | ICD-10-CM | POA: Diagnosis not present

## 2012-11-09 DIAGNOSIS — M25569 Pain in unspecified knee: Secondary | ICD-10-CM | POA: Diagnosis not present

## 2012-11-10 ENCOUNTER — Encounter: Payer: Self-pay | Admitting: Internal Medicine

## 2012-11-10 ENCOUNTER — Encounter: Payer: Self-pay | Admitting: *Deleted

## 2012-11-10 ENCOUNTER — Ambulatory Visit (INDEPENDENT_AMBULATORY_CARE_PROVIDER_SITE_OTHER): Payer: Medicare Other | Admitting: Internal Medicine

## 2012-11-10 VITALS — BP 138/74 | HR 61 | Temp 98.1°F | Resp 14 | Ht 61.5 in | Wt 113.0 lb

## 2012-11-10 DIAGNOSIS — R7309 Other abnormal glucose: Secondary | ICD-10-CM | POA: Diagnosis not present

## 2012-11-10 DIAGNOSIS — T380X5A Adverse effect of glucocorticoids and synthetic analogues, initial encounter: Secondary | ICD-10-CM

## 2012-11-10 DIAGNOSIS — R739 Hyperglycemia, unspecified: Secondary | ICD-10-CM

## 2012-11-10 NOTE — Progress Notes (Signed)
  Subjective:    Patient ID: [name], female    DOB: 06/05/1926, 79 y.o.   MRN: [id number]  HPI [facility] resides [facility]. Her cbg yesterday was 220 this am. She had steroid injection yesterday to her knee. She called her nurse with this reading and was suugested to be seen in the office. She denies any blurry vision, abdominal pain, nausea or vomiting. No weakness. Denies polyuria, polydypsia or polyphagia. Used to be on metformin for DM until march 2013 when due to gi side effect, metformin was discontinued. Her last a1c was 7 and she is off all oral hypoglycemics but took 500 mg metformin this am after scheduling the appointment cbg on Sunday 98 and Monday 110  Review of Systems  Constitutional: Negative for fever, activity change, appetite change and fatigue.  Respiratory: Negative for shortness of breath.   Cardiovascular: Negative for chest pain.  Gastrointestinal: Negative for abdominal pain.  Genitourinary: Negative for dysuria.  Musculoskeletal: Positive for arthralgias.  Neurological: Negative for dizziness, tremors and weakness.  Hematological: Negative for adenopathy.  Psychiatric/Behavioral: Negative for behavioral problems and agitation.       Objective:   Physical Exam  Constitutional: She is oriented to person, place, and time. She appears well-developed and well-nourished. No distress.  HENT:  Head: Normocephalic.  Mouth/Throat: Oropharynx is clear and moist.  Eyes: Pupils are equal, round, and reactive to light.  Neck: Normal range of motion. Neck supple.  Cardiovascular: Normal rate and regular rhythm.   Pulmonary/Chest: Effort normal and breath sounds normal.  Abdominal: Soft. Bowel sounds are normal.  Musculoskeletal: Normal range of motion.  Neurological: She is alert and oriented to person, place, and time. No cranial nerve deficit.  Skin: Skin is warm and dry. She is not diaphoretic.  Psychiatric: She has a normal mood and affect.      Assessment &  Plan:   Steroid induced hyperglycemia- her hyperglycemia one reading is likely from her steroid injection that she received yesterday. No symptoms of hyperglycemia. Monitor clinically for now.told her not to take metformin for now. a1c of 7 is appropriate for her age. It can remain high for tomorrow am. Reassured not to worry about it. Warning symptoms provided.

## 2012-12-10 ENCOUNTER — Other Ambulatory Visit: Payer: Self-pay | Admitting: Family Medicine

## 2012-12-10 ENCOUNTER — Ambulatory Visit (INDEPENDENT_AMBULATORY_CARE_PROVIDER_SITE_OTHER): Payer: Medicare Other | Admitting: Family Medicine

## 2012-12-10 ENCOUNTER — Ambulatory Visit: Payer: Medicare Other

## 2012-12-10 VITALS — BP 170/70 | HR 84 | Temp 97.9°F | Resp 20 | Ht 61.5 in | Wt 109.4 lb

## 2012-12-10 DIAGNOSIS — K5900 Constipation, unspecified: Secondary | ICD-10-CM

## 2012-12-10 DIAGNOSIS — K921 Melena: Secondary | ICD-10-CM

## 2012-12-10 LAB — IFOBT (OCCULT BLOOD): IFOBT: NEGATIVE

## 2012-12-10 NOTE — Patient Instructions (Addendum)
1.  Recommend Pericolace (or Senakot-S) 1-2 tablets twice daily. 2.  Increase water intake 3.  Increase fruit and vegetable intake. 4.  Exercise daily 5.  Return for abdominal pain, vomiting, fever, blood in stool.

## 2012-12-10 NOTE — Progress Notes (Signed)
[address]   [place], [place]  [address]   [phone]  Subjective:    Patient ID: [name], female    DOB: 13-Apr-1926, 79 y.o.   MRN: [id number]  HPI This 79 y.o. female presents for evaluation of constipation.  Onset two weeks ago.  Suffering with constipation; stomach feels raw.  Baseline daily bowel movement.  In last two weeks.  Stools very small and very skinny.  Last colonoscopy 8 years ago; normal; saw Dr. Loreta Ave within past four years.  Passing gas.  Previous surgeries +appendectomy and cholecystectomy.  +flatus.  Appetite down.  Takes hydrocodone for arthritis pain; no hydrocodone in past six months.   Review of Systems  Constitutional: Positive for chills. Negative for fever, diaphoresis and fatigue.  Gastrointestinal: Positive for nausea, constipation and abdominal distention. Negative for vomiting, abdominal pain, diarrhea, blood in stool, anal bleeding and rectal pain.  Genitourinary: Negative for dysuria, urgency, hematuria, flank pain and decreased urine volume.    Past Medical History  Diagnosis Date  . Type II or unspecified type diabetes mellitus without mention of complication, uncontrolled   . Osteoporosis   . Keratoconjunctivitis sicca, not specified as Sjogren's   . Loss of weight   . Unspecified vitamin D deficiency   . Contact dermatitis and other eczema, due to unspecified cause   . Cortical senile cataract   . Irritable bowel syndrome   . Other and unspecified hyperlipidemia   . Unspecified hereditary and idiopathic peripheral neuropathy   . Macular degeneration (senile) of retina, unspecified   . Unspecified essential hypertension   . Reflux esophagitis   . Pain in joint, site unspecified   . Pathologic fracture of vertebrae   . Scoliosis (and kyphoscoliosis), idiopathic   . Memory loss   . Senile osteoporosis     Past Surgical History  Procedure Laterality Date  . Appendectomy    . Spine surgery      vertebroplasty T10, T12  . Spine  surgery      cervical fusion  . Joint replacement Bilateral     complete on right knee and partial on left knee  . Fibroidectomy  1950  . Hemorrhoid surgery  1978  . Cholecystectomy    . Thyroidectomy, partial Left 2007  . Kyphoplasty  2010  . Breast lumpectomy  2011    Prior to Admission medications   Medication Sig Start Date End Date Taking? Authorizing Provider  HYDROcodone-acetaminophen (NORCO/VICODIN) 5-325 MG per tablet Take 1/2 tablet by mouth once daily as needed for pain   Yes Historical Provider, MD  losartan (COZAAR) 50 MG tablet Take 50 mg by mouth daily.   Yes Historical Provider, MD  Multiple Vitamins-Minerals (ICAPS MV PO) Take 2 tablets by mouth daily.   Yes Historical Provider, MD  metFORMIN (GLUCOPHAGE) 500 MG tablet Take 500 mg by mouth daily with breakfast.    Historical Provider, MD    Allergies  Allergen Reactions  . Bactrim     unknown  . Betadine (Povidone Iodine)     unknown  . Nutmeg Oil (Myristica Oil)     unknown  . Sulfa Antibiotics     unknown    History   Social History  . Marital Status: Widowed    Spouse Name: N/A    Number of Children: N/A  . Years of Education: N/A   Occupational History  . Not on file.   Social History Main Topics  . Smoking status: Never Smoker   . Smokeless  tobacco: Not on file  . Alcohol Use: No  . Drug Use: No  . Sexually Active: No   Other Topics Concern  . Not on file   Social History Narrative  . No narrative on file    Family History  Problem Relation Age of Onset  . Diabetes Mother   . Stroke Father        Objective:   Physical Exam  Nursing note and vitals reviewed. Constitutional: She is oriented to person, place, and time. She appears well-developed and well-nourished. No distress.  HENT:  Mouth/Throat: Oropharynx is clear and moist.  Eyes: Conjunctivae are normal. Pupils are equal, round, and reactive to light.  Neck: Normal range of motion. Neck supple.  Cardiovascular: Normal  rate and regular rhythm.   Pulmonary/Chest: Effort normal and breath sounds normal.  Abdominal: Soft. She exhibits no distension and no mass. Bowel sounds are increased. There is no hepatosplenomegaly. There is no tenderness. There is no rebound, no guarding and no CVA tenderness.  Lymphadenopathy:    She has no cervical adenopathy.  Neurological: She is alert and oriented to person, place, and time.  Skin: No rash noted. She is not diaphoretic.  Psychiatric: She has a normal mood and affect. Her behavior is normal.   Results for orders placed in visit on 12/10/12  IFOBT (OCCULT BLOOD)      Result Value Range   IFOBT Negative      [facility] reading (PRIMARY) by  Dr. Katrinka Blazing.  AAS:  Severe scoliosis; no free air; large stool burden; calcified fibroid.      Assessment & Plan:  Unspecified constipation - Plan: DG Abd 2 Views  Black tarry stools - Plan: IFOBT POC (occult bld, rslt in office)  1. Constipation:  New onset; recommend increased water intake, increased fiber intake, increased exercise.  Recommend Senakot-S/Pericolace 1-2 bid for the next 5-7 days PRN.  RTC immediately for fever, vomiting, severe abdominal pain, bloody stools. Benign abdominal exam in office. 2. Black tarry stools: New.  Hemosure negative in office.  No orders of the defined types were placed in this encounter.

## 2012-12-14 ENCOUNTER — Encounter: Payer: Self-pay | Admitting: Nurse Practitioner

## 2012-12-14 ENCOUNTER — Encounter: Payer: Self-pay | Admitting: *Deleted

## 2012-12-14 ENCOUNTER — Ambulatory Visit (INDEPENDENT_AMBULATORY_CARE_PROVIDER_SITE_OTHER): Payer: Medicare Other | Admitting: Nurse Practitioner

## 2012-12-14 VITALS — BP 170/68 | HR 54 | Temp 98.8°F | Resp 16 | Ht 61.0 in | Wt 109.8 lb

## 2012-12-14 DIAGNOSIS — I10 Essential (primary) hypertension: Secondary | ICD-10-CM

## 2012-12-14 DIAGNOSIS — K5900 Constipation, unspecified: Secondary | ICD-10-CM

## 2012-12-14 MED ORDER — LOSARTAN POTASSIUM 100 MG PO TABS: 100.0000 mg | ORAL_TABLET | Freq: Every day | ORAL | Status: DC

## 2012-12-14 NOTE — Patient Instructions (Addendum)
Increase losartan to 2 tablets daily-- losartan 100 mg a day for blood pressure  For constipation Take Miralax 17 gm package- 1 package daily for 5 days  Benefiber daily Start taking senokot-S daily  Follow up in 1 week with Blood Pressure readings    Constipation, Adult Constipation is when a person has fewer than 3 bowel movements a week; has difficulty having a bowel movement; or has stools that are dry, hard, or larger than normal. As people grow older, constipation is more common. If you try to fix constipation with medicines that make you have a bowel movement (laxatives), the problem may get worse. Long-term laxative use may cause the muscles of the colon to become weak. A low-fiber diet, not taking in enough fluids, and taking certain medicines may make constipation worse. CAUSES   Certain medicines, such as antidepressants, pain medicine, iron supplements, antacids, and water pills.   Certain diseases, such as diabetes, irritable bowel syndrome (IBS), thyroid disease, or depression.   Not drinking enough water.   Not eating enough fiber-rich foods.   Stress or travel.  Lack of physical activity or exercise.  Not going to the restroom when there is the urge to have a bowel movement.  Ignoring the urge to have a bowel movement.  Using laxatives too much. SYMPTOMS   Having fewer than 3 bowel movements a week.   Straining to have a bowel movement.   Having hard, dry, or larger than normal stools.   Feeling full or bloated.   Pain in the lower abdomen.  Not feeling relief after having a bowel movement. DIAGNOSIS  Your caregiver will take a medical history and perform a physical exam. Further testing may be done for severe constipation. Some tests may include:   A barium enema X-ray to examine your rectum, colon, and sometimes, your small intestine.  A sigmoidoscopy to examine your lower colon.  A colonoscopy to examine your entire colon. TREATMENT   Treatment will depend on the severity of your constipation and what is causing it. Some dietary treatments include drinking more fluids and eating more fiber-rich foods. Lifestyle treatments may include regular exercise. If these diet and lifestyle recommendations do not help, your caregiver may recommend taking over-the-counter laxative medicines to help you have bowel movements. Prescription medicines may be prescribed if over-the-counter medicines do not work.  HOME CARE INSTRUCTIONS   Increase dietary fiber in your diet, such as fruits, vegetables, whole grains, and beans. Limit high-fat and processed sugars in your diet, such as [place] fries, hamburgers, cookies, candies, and soda.   A fiber supplement may be added to your diet if you cannot get enough fiber from foods.   Drink enough fluids to keep your urine clear or pale yellow.   Exercise regularly or as directed by your caregiver.   Go to the restroom when you have the urge to go. Do not hold it.  Only take medicines as directed by your caregiver. Do not take other medicines for constipation without talking to your caregiver first. SEEK IMMEDIATE MEDICAL CARE IF:   You have bright red blood in your stool.   Your constipation lasts for more than 4 days or gets worse.   You have abdominal or rectal pain.   You have thin, pencil-like stools.  You have unexplained weight loss. MAKE SURE YOU:   Understand these instructions.  Will watch your condition.  Will get help right away if you are not doing well or get worse. Document Released: 03/14/2004  Document Revised: 09/08/2011 Document Reviewed: 05/20/2011 [facility] Patient Information 2014 [place], [place].

## 2012-12-14 NOTE — Progress Notes (Signed)
Patient ID: [name], female   DOB: 1925/09/27, 79 y.o.   MRN: [id number]   Allergies  Allergen Reactions  . Bactrim     unknown  . Betadine (Povidone Iodine)     unknown  . Nutmeg Oil (Myristica Oil)     unknown  . Sulfa Antibiotics     unknown    Chief Complaint  Patient presents with  . Acute Visit    high blood pressure, constipation     HPI: Patient is a 79 y.o. female seen in the office today for stomach problems. She feels "queezy" and constipation. Pt has been taking Senakot 1-2 tablets daily and increased water intake. this has not helped.  Went to the urgent care earlier this week, had xray done and it showed constipation  Has noticed constipation 2-3 weeks and pt reports "Stomach problems" for 3 weeks. Can not describe feeling- reports it is uncomfortable; no nausea or vomiting.   Also reports high blood pressure- nurse at her apartment took blood pressure yesterday and it was elevated as well. Denies chest pain, shortness of breath, weakness, or headaches  Review of Systems:  Review of Systems  Constitutional: Positive for malaise/fatigue. Negative for fever and chills.  Eyes: Negative for blurred vision.  Respiratory: Negative for cough.   Cardiovascular: Negative for chest pain and palpitations.  Gastrointestinal: Positive for constipation. Negative for heartburn, nausea, vomiting and diarrhea. Abdominal pain: abdominal discomfort- no "pain"  Musculoskeletal: Positive for back pain. Negative for myalgias and joint pain.  Neurological: Negative for weakness and headaches.     Past Medical History  Diagnosis Date  . Type II or unspecified type diabetes mellitus without mention of complication, uncontrolled   . Osteoporosis   . Keratoconjunctivitis sicca, not specified as Sjogren's   . Loss of weight   . Unspecified vitamin D deficiency   . Contact dermatitis and other eczema, due to unspecified cause   . Cortical senile cataract   . Irritable bowel  syndrome   . Other and unspecified hyperlipidemia   . Unspecified hereditary and idiopathic peripheral neuropathy   . Macular degeneration (senile) of retina, unspecified   . Unspecified essential hypertension   . Reflux esophagitis   . Pain in joint, site unspecified   . Pathologic fracture of vertebrae   . Scoliosis (and kyphoscoliosis), idiopathic   . Memory loss   . Senile osteoporosis    Past Surgical History  Procedure Laterality Date  . Appendectomy    . Spine surgery      vertebroplasty T10, T12  . Spine surgery      cervical fusion  . Joint replacement Bilateral     complete on right knee and partial on left knee  . Fibroidectomy  1950  . Hemorrhoid surgery  1978  . Cholecystectomy    . Thyroidectomy, partial Left 2007  . Kyphoplasty  2010  . Breast lumpectomy  2011   Social History:   reports that she has never smoked. She does not have any smokeless tobacco history on file. She reports that she does not drink alcohol or use illicit drugs.  Family History  Problem Relation Age of Onset  . Diabetes Mother   . Stroke Father     Medications: Patient's Medications  New Prescriptions   No medications on file  Previous Medications   HYDROCODONE-ACETAMINOPHEN (NORCO/VICODIN) 5-325 MG PER TABLET    Take 1/2 tablet by mouth once daily as needed for pain   LOSARTAN (COZAAR) 50 MG TABLET  Take 50 mg by mouth daily.   METFORMIN (GLUCOPHAGE) 500 MG TABLET    Take 500 mg by mouth daily with breakfast.   MULTIPLE VITAMINS-MINERALS (ICAPS MV PO)    Take 2 tablets by mouth daily.   SENNA (SENOKOT) 8.6 MG TABLET    Take 2 tablets by mouth daily.  Modified Medications   No medications on file  Discontinued Medications   No medications on file     Physical Exam:  Filed Vitals:   12/14/12 0859  BP: 170/68  Pulse: 54  Temp: 98.8 F (37.1 C)  TempSrc: Oral  Resp: 16  Height: 5\' 1"  (1.549 m)  Weight: 109 lb 12.8 oz (49.805 kg)  SpO2: 96%    Physical Exam   Constitutional: She is oriented to person, place, and time and well-developed, well-nourished, and in no distress. No distress.  HENT:  Head: Normocephalic and atraumatic.  Eyes: Conjunctivae and EOM are normal. Pupils are equal, round, and reactive to light.  Neck: Normal range of motion. Neck supple. No thyromegaly present.  Cardiovascular: Normal rate and regular rhythm.   Pulmonary/Chest: Effort normal and breath sounds normal. No respiratory distress.  Abdominal: Soft. Bowel sounds are normal. She exhibits no distension. There is no tenderness.  Musculoskeletal: She exhibits no edema and no tenderness.  Reports lower back pain  Neurological: She is alert and oriented to person, place, and time. Gait normal.  Skin: Skin is warm and dry. She is not diaphoretic.     Labs reviewed: Basic Metabolic Panel: No results found for this basename: NA, K, CL, CO2, GLUCOSE, BUN, CREATININE, CALCIUM, MG, PHOS, TSH,  in the last 8760 hours Liver Function Tests: No results found for this basename: AST, ALT, ALKPHOS, BILITOT, PROT, ALBUMIN,  in the last 8760 hours No results found for this basename: LIPASE, AMYLASE,  in the last 8760 hours No results found for this basename: AMMONIA,  in the last 8760 hours CBC:  Recent Labs  08/27/12 1417  WBC 10.7*  HGB 13.4  HCT 42.6  MCV 94.0      Assessment/Plan Hypertension-  Increase losartan to 2 tablets daily-- losartan 100 mg a day for blood pressure Take blood pressure readings and record- increase activity with dietary modifications  Constipation Take Miralax 17 gm package- 1 package daily for 5 days Benefiber daily Start taking senokot-S daily  Follow up in 1 week with Blood Pressure readings

## 2012-12-18 DIAGNOSIS — I10 Essential (primary) hypertension: Secondary | ICD-10-CM | POA: Insufficient documentation

## 2013-01-18 DIAGNOSIS — M1710 Unilateral primary osteoarthritis, unspecified knee: Secondary | ICD-10-CM | POA: Diagnosis not present

## 2013-01-18 DIAGNOSIS — IMO0002 Reserved for concepts with insufficient information to code with codable children: Secondary | ICD-10-CM | POA: Diagnosis not present

## 2013-02-08 DIAGNOSIS — E785 Hyperlipidemia, unspecified: Secondary | ICD-10-CM | POA: Diagnosis not present

## 2013-02-08 DIAGNOSIS — E559 Vitamin D deficiency, unspecified: Secondary | ICD-10-CM | POA: Diagnosis not present

## 2013-02-08 DIAGNOSIS — E119 Type 2 diabetes mellitus without complications: Secondary | ICD-10-CM | POA: Diagnosis not present

## 2013-02-17 ENCOUNTER — Non-Acute Institutional Stay: Payer: Medicare Other | Admitting: Internal Medicine

## 2013-02-17 VITALS — BP 120/64 | HR 72 | Ht 61.0 in | Wt 109.0 lb

## 2013-02-17 DIAGNOSIS — E559 Vitamin D deficiency, unspecified: Secondary | ICD-10-CM | POA: Insufficient documentation

## 2013-02-17 DIAGNOSIS — M545 Low back pain, unspecified: Secondary | ICD-10-CM | POA: Insufficient documentation

## 2013-02-17 DIAGNOSIS — I10 Essential (primary) hypertension: Secondary | ICD-10-CM | POA: Diagnosis not present

## 2013-02-17 DIAGNOSIS — G609 Hereditary and idiopathic neuropathy, unspecified: Secondary | ICD-10-CM

## 2013-02-17 DIAGNOSIS — E785 Hyperlipidemia, unspecified: Secondary | ICD-10-CM | POA: Diagnosis not present

## 2013-02-17 DIAGNOSIS — K219 Gastro-esophageal reflux disease without esophagitis: Secondary | ICD-10-CM

## 2013-02-17 DIAGNOSIS — E213 Hyperparathyroidism, unspecified: Secondary | ICD-10-CM | POA: Insufficient documentation

## 2013-02-17 DIAGNOSIS — R413 Other amnesia: Secondary | ICD-10-CM | POA: Diagnosis not present

## 2013-02-17 DIAGNOSIS — K589 Irritable bowel syndrome without diarrhea: Secondary | ICD-10-CM | POA: Insufficient documentation

## 2013-02-17 DIAGNOSIS — E119 Type 2 diabetes mellitus without complications: Secondary | ICD-10-CM

## 2013-02-17 DIAGNOSIS — E1140 Type 2 diabetes mellitus with diabetic neuropathy, unspecified: Secondary | ICD-10-CM | POA: Insufficient documentation

## 2013-02-17 HISTORY — DX: Gastro-esophageal reflux disease without esophagitis: K21.9

## 2013-02-17 NOTE — Patient Instructions (Signed)
Continue current medications. 

## 2013-02-17 NOTE — Progress Notes (Signed)
Failed clock drawing  

## 2013-02-17 NOTE — Progress Notes (Signed)
  Subjective:    Patient ID: [name], female    DOB: 11/12/1925, 79 y.o.   MRN: [id number]  HPI Other and unspecified hyperlipidemia: controlled  Unspecified hereditary and idiopathic peripheral neuropathy: unchanged  Unspecified essential hypertension: controlled  Memory loss: unchanged  Diabetes: controlled  GERD (gastroesophageal reflux disease); asymptomatic  Irritable bowel syndrome: improved  Lumbago: chronic  Hyperparathyroidism, unspecified: minimal elevation of calcium    Current Outpatient Prescriptions on File Prior to Visit  Medication Sig Dispense Refill  . losartan (COZAAR) 100 MG tablet Take 1 tablet (100 mg total) by mouth daily.  30 tablet  3  . Multiple Vitamins-Minerals (ICAPS MV PO) Take 2 tablets by mouth daily.       No current facility-administered medications on file prior to visit.    Review of Systems  Constitutional: Negative for fever, activity change, appetite change and fatigue.  HENT: Positive for hearing loss.   Eyes: Positive for visual disturbance.  Respiratory: Negative for shortness of breath.   Cardiovascular: Negative for chest pain.  Gastrointestinal: Negative for abdominal pain.  Genitourinary: Negative for dysuria.  Musculoskeletal: Positive for myalgias, back pain and arthralgias.  Skin: Negative.   Neurological: Negative for dizziness, tremors and weakness.  Hematological: Negative for adenopathy.  Psychiatric/Behavioral: Negative for behavioral problems and agitation.       Objective:BP 120/64  Pulse 72  Ht 5\' 1"  (1.549 m)  Wt 109 lb (49.442 kg)  BMI 20.61 kg/m2    Physical Exam  Constitutional: She is oriented to person, place, and time. She appears well-developed and well-nourished. No distress.  HENT:  Head: Normocephalic and atraumatic.  Right Ear: External ear normal.  Left Ear: External ear normal.  Nose: Nose normal.  Mouth/Throat: Oropharynx is clear and moist.  Eyes: Conjunctivae and EOM are  normal. Pupils are equal, round, and reactive to light.  Corrective lenses.Cataracts.  Neck: Normal range of motion. Neck supple. No JVD present. No tracheal deviation present. No thyromegaly present.  Cardiovascular: Normal rate, regular rhythm and normal heart sounds.  Exam reveals no gallop and no friction rub.   No murmur heard. Pulmonary/Chest: Effort normal and breath sounds normal. No respiratory distress. She has no wheezes. She has no rales.  Abdominal: Soft. Bowel sounds are normal. She exhibits no distension and no mass. There is no tenderness.  Musculoskeletal: Normal range of motion. She exhibits no edema and no tenderness.  Lymphadenopathy:    She has no cervical adenopathy.  Neurological: She is alert and oriented to person, place, and time. No cranial nerve deficit.  Skin: No rash noted. She is not diaphoretic. No erythema. No pallor.  Psychiatric: She has a normal mood and affect. Her behavior is normal. Thought content normal.    LAB REVIEW 02/08/13 BMP: glu 110, BUN 22 , Creat 0.82, Ca++ 10.6  Lipids: TC 166, trig 57,HDL 67, LDL88  A1c 7.0  Urine microalbumin 1.25  Vit D 332     Assessment & Plan:  Other and unspecified hyperlipidemia: controlled  Unspecified hereditary and idiopathic peripheral neuropathy: unchanged  Unspecified essential hypertension; controlled  Memory loss: unchanged  Diabetes: controlled off metformin  GERD (gastroesophageal reflux disease); asymptomatic  Irritable bowel syndrome: i,proved  Lumbago: try Aleve twice daily  Hyperparathyroidism, unspecified: minimal elevation in the calcium  Unspecified vitamin D deficiency: corrected with supplements.

## 2013-03-01 DIAGNOSIS — M1710 Unilateral primary osteoarthritis, unspecified knee: Secondary | ICD-10-CM | POA: Diagnosis not present

## 2013-03-01 DIAGNOSIS — IMO0002 Reserved for concepts with insufficient information to code with codable children: Secondary | ICD-10-CM | POA: Diagnosis not present

## 2013-03-10 ENCOUNTER — Encounter: Payer: Self-pay | Admitting: Obstetrics & Gynecology

## 2013-03-17 ENCOUNTER — Encounter: Payer: Self-pay | Admitting: Obstetrics & Gynecology

## 2013-03-30 ENCOUNTER — Ambulatory Visit: Payer: Medicare Other | Admitting: Obstetrics & Gynecology

## 2013-03-31 ENCOUNTER — Ambulatory Visit: Payer: Medicare Other | Admitting: Obstetrics & Gynecology

## 2013-04-01 ENCOUNTER — Ambulatory Visit: Payer: Medicare Other | Admitting: Obstetrics & Gynecology

## 2013-04-11 DIAGNOSIS — H353190 Nonexudative age-related macular degeneration, unspecified eye, stage unspecified: Secondary | ICD-10-CM | POA: Diagnosis not present

## 2013-04-11 DIAGNOSIS — H2510 Age-related nuclear cataract, unspecified eye: Secondary | ICD-10-CM | POA: Diagnosis not present

## 2013-04-13 DIAGNOSIS — Z23 Encounter for immunization: Secondary | ICD-10-CM | POA: Diagnosis not present

## 2013-04-20 DIAGNOSIS — H2510 Age-related nuclear cataract, unspecified eye: Secondary | ICD-10-CM | POA: Diagnosis not present

## 2013-04-22 ENCOUNTER — Ambulatory Visit (INDEPENDENT_AMBULATORY_CARE_PROVIDER_SITE_OTHER): Payer: Medicare Other | Admitting: Obstetrics & Gynecology

## 2013-04-22 ENCOUNTER — Encounter: Payer: Self-pay | Admitting: Obstetrics & Gynecology

## 2013-04-22 VITALS — BP 134/60 | HR 74 | Resp 16 | Ht 61.25 in | Wt 109.0 lb

## 2013-04-22 DIAGNOSIS — Z01419 Encounter for gynecological examination (general) (routine) without abnormal findings: Secondary | ICD-10-CM | POA: Diagnosis not present

## 2013-04-22 NOTE — Progress Notes (Signed)
79 y.o. G0P0000 WidowedCaucasianF here for annual exam.  No vaginal bleeding.  Off Glugophage.  Blood sugars have been good.  Having cataracts in the coming month.  Seeing Dr. Hazle Quant for this.  Having a little concern about memory.  Feels her short term memory is failing some.  Just wants to express this concern.  Doesn't get lost.  Doesn't lose things.  Records what we discuss but has done this for years.  D/w pt neurology referral.  She will talk with Dr. Chilton Si.  She also has questions about constipation.  Much bigger issue for her this year.  No LMP recorded. Patient is postmenopausal.          Sexually active: no  The current method of family planning is post menopausal status.    Exercising: yes  Walking, Water Exercise  2-3 days a week Smoking :no  Health Maintenance: Pap: 12/30/07 neg History of abnormal Pap:  no MMG: 05/20/12 neg Colonoscopy:  2001 neg, pt said she does not need another per Dr. Loreta Ave BMD:   01/23/10 Osteoporosis TDaP:  04/2002 Screening Labs: 8/14 with Dr. Chilton Si (in his note), Hb today: pcp , Urine today: pcp   reports that she has never smoked. She has never used smokeless tobacco. She reports that she drinks about 0.5 ounces of alcohol per week. She reports that she does not use illicit drugs.  Past Medical History  Diagnosis Date  . Type II or unspecified type diabetes mellitus without mention of complication, uncontrolled   . Keratoconjunctivitis sicca, not specified as Sjogren's   . Loss of weight   . Unspecified vitamin D deficiency   . Contact dermatitis and other eczema, due to unspecified cause   . Cortical senile cataract   . Irritable bowel syndrome   . Other and unspecified hyperlipidemia   . Unspecified hereditary and idiopathic peripheral neuropathy   . Macular degeneration (senile) of retina, unspecified   . Unspecified essential hypertension   . Reflux esophagitis   . Pain in joint, site unspecified   . Pathologic fracture of vertebrae   .  Scoliosis (and kyphoscoliosis), idiopathic   . Memory loss   . Senile osteoporosis   . GERD (gastroesophageal reflux disease) 02/17/2013    Past Surgical History  Procedure Laterality Date  . Appendectomy    . Spine surgery      vertebroplasty T10, T12  . Spine surgery  11/02/2001     C4-5 & C5-6 Titanium Plate Placed Dr. Channing Mutters  . Medial partial knee replacement Left 2004    Dr. Sherlean Foot  . Fibroidectomy  1950  . Hemorrhoid surgery  1978  . Cholecystectomy  2004    Dr. Abbey Chatters  . Thyroidectomy, partial Left 2007  . Kyphoplasty  2010    T10 & T12 Dr. Maeola Harman  . Breast lumpectomy  02/05/2010    Dr. Marca Ancona  . Total knee arthroplasty Right 2009    Dr. Charlann Boxer   . Colonoscopy  2001    Current Outpatient Prescriptions  Medication Sig Dispense Refill  . losartan (COZAAR) 100 MG tablet Take 50 mg by mouth daily.      . Multiple Vitamins-Minerals (ICAPS MV PO) Take 2 tablets by mouth daily.      . polyvinyl alcohol-povidone (REFRESH) 1.4-0.6 % ophthalmic solution 1-2 drops as needed.      . prednisoLONE acetate (PRED FORTE) 1 % ophthalmic suspension       . PROLENSA 0.07 % SOLN  No current facility-administered medications for this visit.    Family History  Problem Relation Age of Onset  . Diabetes Mother   . Heart disease Mother     CHF  . Stroke Father   . Heart disease Sister     MI    ROS:  Pertinent items are noted in HPI.  Otherwise, a comprehensive ROS was negative.  Exam:   BP 134/60  Pulse 74  Resp 16  Ht 5' 1.25" (1.556 m)  Wt 109 lb (49.442 kg)  BMI 20.42 kg/m2  Weight change:-5lbs  Height: 5' 1.25" (155.6 cm)  Ht Readings from Last 3 Encounters:  04/22/13 5' 1.25" (1.556 m)  02/17/13 5\' 1"  (1.549 m)  12/14/12 5\' 1"  (1.549 m)    General appearance: alert, cooperative and appears stated age Head: Normocephalic, without obvious abnormality, atraumatic Neck: no adenopathy, supple, symmetrical, trachea midline and thyroid normal to inspection and  palpation Lungs: clear to auscultation bilaterally Breasts: normal appearance, no masses or tenderness Heart: regular rate and rhythm Abdomen: soft, non-tender; bowel sounds normal; no masses,  no organomegaly Extremities: extremities normal, atraumatic, no cyanosis or edema Skin: Skin color, texture, turgor normal. No rashes or lesions Lymph nodes: Cervical, supraclavicular, and axillary nodes normal. No abnormal inguinal nodes palpated Neurologic: Grossly normal   Pelvic: External genitalia:  no lesions              Urethra:  normal appearing urethra with no masses, tenderness or lesions              Bartholins and Skenes: normal                 Vagina: normal appearing vagina with normal color and discharge, no lesions              Cervix: no lesions              Pap taken: no Bimanual Exam:  Uterus:  normal size, contour, position, consistency, mobility, non-tender              Adnexa: normal adnexa and no mass, fullness, tenderness               Rectovaginal: Confirms               Anus:  normal sphincter tone, no lesions  A:  Well Woman with normal exam PMP, No HRT Macular degeneration and cataracts.  Sees Dr. Hazle Quant Constipation.  Colace and Miralax discussed. Osteoporosis  P:   Mammogram yearly pap smear not indicated Labs with Dr. Chilton Si return annually or prn  An After Visit Summary was printed and given to the patient.

## 2013-04-22 NOTE — Patient Instructions (Signed)

## 2013-04-30 HISTORY — PX: CATARACT EXTRACTION W/ INTRAOCULAR LENS  IMPLANT, BILATERAL: SHX1307

## 2013-05-04 DIAGNOSIS — H2510 Age-related nuclear cataract, unspecified eye: Secondary | ICD-10-CM | POA: Diagnosis not present

## 2013-05-04 DIAGNOSIS — H269 Unspecified cataract: Secondary | ICD-10-CM | POA: Diagnosis not present

## 2013-05-04 DIAGNOSIS — H2589 Other age-related cataract: Secondary | ICD-10-CM | POA: Diagnosis not present

## 2013-05-05 ENCOUNTER — Other Ambulatory Visit: Payer: Self-pay | Admitting: *Deleted

## 2013-05-05 MED ORDER — GLUCOSE BLOOD VI STRP: ORAL_STRIP | Status: DC

## 2013-05-19 DIAGNOSIS — H2510 Age-related nuclear cataract, unspecified eye: Secondary | ICD-10-CM | POA: Diagnosis not present

## 2013-05-23 DIAGNOSIS — H269 Unspecified cataract: Secondary | ICD-10-CM | POA: Diagnosis not present

## 2013-05-23 DIAGNOSIS — H2510 Age-related nuclear cataract, unspecified eye: Secondary | ICD-10-CM | POA: Diagnosis not present

## 2013-05-23 DIAGNOSIS — H2589 Other age-related cataract: Secondary | ICD-10-CM | POA: Diagnosis not present

## 2013-07-11 ENCOUNTER — Other Ambulatory Visit: Payer: Self-pay | Admitting: Internal Medicine

## 2013-07-13 ENCOUNTER — Other Ambulatory Visit: Payer: Self-pay | Admitting: Internal Medicine

## 2013-07-26 DIAGNOSIS — E119 Type 2 diabetes mellitus without complications: Secondary | ICD-10-CM | POA: Diagnosis not present

## 2013-07-26 DIAGNOSIS — I10 Essential (primary) hypertension: Secondary | ICD-10-CM | POA: Diagnosis not present

## 2013-07-26 DIAGNOSIS — E785 Hyperlipidemia, unspecified: Secondary | ICD-10-CM | POA: Diagnosis not present

## 2013-07-26 LAB — LIPID PANEL
Cholesterol: 168 mg/dL (ref 0–200)
HDL: 64 mg/dL (ref 35–70)
LDL Cholesterol: 94 mg/dL
Triglycerides: 52 mg/dL (ref 40–160)

## 2013-07-26 LAB — BASIC METABOLIC PANEL
BUN: 19 mg/dL (ref 4–21)
Creatinine: 0.7 mg/dL (ref 0.5–1.1)
Glucose: 119 mg/dL
Potassium: 4.2 mmol/L (ref 3.4–5.3)
Sodium: 140 mmol/L (ref 137–147)

## 2013-07-26 LAB — HEPATIC FUNCTION PANEL
ALT: 11 U/L (ref 7–35)
AST: 18 U/L (ref 13–35)
Alkaline Phosphatase: 55 U/L (ref 25–125)
Bilirubin, Total: 0.6 mg/dL

## 2013-07-26 LAB — HEMOGLOBIN A1C: Hgb A1c MFr Bld: 7.2 % — AB (ref 4.0–6.0)

## 2013-08-04 ENCOUNTER — Non-Acute Institutional Stay: Payer: Medicare Other | Admitting: Internal Medicine

## 2013-08-04 VITALS — BP 146/60 | HR 84 | Ht 61.25 in | Wt 114.0 lb

## 2013-08-04 DIAGNOSIS — I10 Essential (primary) hypertension: Secondary | ICD-10-CM

## 2013-08-04 DIAGNOSIS — E213 Hyperparathyroidism, unspecified: Secondary | ICD-10-CM | POA: Diagnosis not present

## 2013-08-04 DIAGNOSIS — M545 Low back pain, unspecified: Secondary | ICD-10-CM

## 2013-08-04 DIAGNOSIS — E785 Hyperlipidemia, unspecified: Secondary | ICD-10-CM

## 2013-08-04 DIAGNOSIS — K589 Irritable bowel syndrome without diarrhea: Secondary | ICD-10-CM

## 2013-08-04 DIAGNOSIS — E119 Type 2 diabetes mellitus without complications: Secondary | ICD-10-CM | POA: Diagnosis not present

## 2013-08-04 DIAGNOSIS — G609 Hereditary and idiopathic neuropathy, unspecified: Secondary | ICD-10-CM

## 2013-08-04 DIAGNOSIS — K219 Gastro-esophageal reflux disease without esophagitis: Secondary | ICD-10-CM

## 2013-08-04 DIAGNOSIS — M4120 Other idiopathic scoliosis, site unspecified: Secondary | ICD-10-CM | POA: Insufficient documentation

## 2013-08-04 NOTE — Progress Notes (Signed)
Patient ID: [name], female   DOB: May 02, 1926, 80 y.o.   MRN: [id number]    Nursing Home Location:  [place] of Service: Clinic (12)  PCP: Estill Dooms, MD  Code Status: LIVING WILL  Emergency contact; Michel Bickers, friend: [phone]  Meryl Dare, niece: [phone]   Allergies  Allergen Reactions  . Bactrim     unknown  . Betadine [Povidone Iodine]     unknown  . Metformin And Related     Hurt stomach  . Nutmeg Oil (Myristica Oil)     unknown  . Sulfa Antibiotics     unknown    Chief Complaint  Patient presents with  . Medical Managment of Chronic Issues    Comprehensive Exam:  blood pressure, blood sugar, tyroid, cholesterol    HPI:   Unspecified essential hypertension; controlled  Diabetes: some loss of control. Does not follow a strict diet  Hyperparathyroidism, unspecified: mild elevation in calcium  Unspecified hereditary and idiopathic peripheral neuropathy: los of vibratory sensation in both feet  Lumbago: Chronic back pains. Worse in the lower back. Asking if she might be eligible to use a hot tub. Benefits from use of pool at [facility], but it will be closed about 2 months for repairs.  Hyperlipidemia: controlled  Scoliosis: chronic  Irritable bowel syndrome: constipation dominant  GERD (gastroesophageal reflux disease): asymptomatic     Past Medical History  Diagnosis Date  . Type II or unspecified type diabetes mellitus without mention of complication, uncontrolled   . Keratoconjunctivitis sicca, not specified as Sjogren's   . Loss of weight   . Unspecified vitamin D deficiency   . Contact dermatitis and other eczema, due to unspecified cause   . Cortical senile cataract   . Irritable bowel syndrome   . Other and unspecified hyperlipidemia   . Unspecified hereditary and idiopathic peripheral neuropathy   . Macular degeneration (senile) of retina, unspecified   . Unspecified essential hypertension   . Reflux  esophagitis   . Pain in joint, site unspecified   . Pathologic fracture of vertebrae   . Scoliosis (and kyphoscoliosis), idiopathic   . Memory loss   . Senile osteoporosis   . GERD (gastroesophageal reflux disease) 02/17/2013  . Other malaise and fatigue 06/17/2010  . Hyperparathyroidism, unspecified 03/26/2010    Past Surgical History  Procedure Laterality Date  . Appendectomy    . Spine surgery      vertebroplasty T10, T12  . Spine surgery  11/02/2001     C4-5 & C5-6 Titanium Plate Placed Dr. Carloyn Manner  . Medial partial knee replacement Left 2004    Dr. Ronnie Derby  . Fibroidectomy  1950  . Hemorrhoid surgery  1978  . Cholecystectomy  2004    Dr. Zella Richer  . Thyroidectomy, partial Left 2007  . Kyphoplasty  2010    T10 & T12 Dr. Erline Levine  . Breast lumpectomy  02/05/2010    Dr. Christie Beckers  . Total knee arthroplasty Right 2009    Dr. Alvan Dame   . Colonoscopy  2001    Dr. Collene Mares  . Cataract extraction w/ intraocular lens  implant, bilateral  04/2013    Dr. Jasmine Pang Endocrine: Cyd Silence Ophth: Thomasene Mohair: Luce[place]/ Ramos Neurosurg: Dionisio David Cardiology: [place] Chiropractor GYN: Edwinna Areola at [place]  Nov 2011 Colonoscopy: Collene Mares)  03/15/03: CT abd: scattered multiple low density liver lesions. Probably represents hepatic cysts. Calcified cholelithiasis. Left renal  cortical and parapelvic cysts.  03/23/03 Abd [place]: Liver cysts  04/27/03 CT chest: no nodule in the upper lobe. Minimal scarring in apices. Possible thyroid adenoma in left lobe near isthmus. Multiple hepatic cysts.  07/02/13 thyroid US: dominant solid left thyroid nodule  08/29/03 Bone Density 12/06/2004-Chest X-Ray: Mild changes of COPD. No acute abnormality 12/06/2004-Left Rib X-Ray: No visible fracture 12/06/2004-Pelvic/transvaginal Ultrasound: 4cm fibroid in the left posterior uterus with calcifications. Irregular and inhomogeneous endometrium measuring 4.46mm in thickness.  Sonohysterogram may be helpful to assess further if necessary clinically  04/15/2005-EKG: Sinus Rhythm, Leftward axis, Otherwise Normal ECG 04-21-2005-Abdominal Ultrasound: Status post cholecystectomy, otherwise negative abdominal ultrasound. 04/23/2005-Bone Scan: No evidence of rib fracture. Old compression fracture at T11-T12 versus high grade arthropathy. Degenerative changes at L5-S1 on the left. No evidence of rib fracture. 05/05/2005-Nuclear Stress Test:Normal-Dr. Peter [place] 02/23/2006-Chest X-Ray: No change. Findings compatible with chronic obstructive pulmonary disease without active findings. 10/05/2006-Rib X-Ray: No definite acute left rib fracture. No acute cardiopulmonary process. 10/05/2006-Abdomen Ultrasound: Right hepatic lobe cyst. Other cystic areas seen in the liver on prior ultrasound of 03/23/2003 not readily appreciated today. 05/05/2007-Lumbar Spine X-Ray: Scoliosis and spondylosis. Prior cholecystectomy. Calcified uterine fibroid. 12/2007-Pap: Normal 01/12/2008-Right Knee X-Ray: Osteoarthritis  01/10/2010 Pelvic Exam Normal Dr Janeann Merl  12/2009-Mammogram 01/15/2010-Left Breast Ultrasound: 207X2.6X0.7cm ill defined, hypoechoic, palpable mass in the 8:30 o'clock position of the left breast. This has mammographic, ultrasound and physical examination findings highly suspicious for malignancy. Ultrasound guided core needle biopsy is recommended 01/17/2010-Left Breast Biopsy: Highly suggestive of malignancy 01/23/2010-Bone Density: Osteoporosis  06/17/2010 Chest X-Ray No acute right rib fracture  06/17/2010 X-Ray of Left Ribs No acute fracture  06/17/2010 X-Ray of the Abdomen No acute findings evident in the abdomen on this single view. 02/20/2011 Abdominal complete ultrasound: Post cholecystectomy. Sub-centimeter liver cyst side-by-side. Small portions of the pancreas and abdominal aorta not well visualized secondary to overlying bowel gas. 09/09/2011 Chest x-ray no acute cardiopulmonary  abnormalities 2013 Mammogram negative  Social History: History   Social History  . Marital Status: Widowed    Spouse Name: N/A    Number of Children: N/A  . Years of Education: N/A   Occupational History  . retired Photographer     Social History Main Topics  . Smoking status: Never Smoker   . Smokeless tobacco: Never Used  . Alcohol Use: 0.5 oz/week    1 drink(s) per week     Comment: pcc glass of wine  . Drug Use: No  . Sexual Activity: No   Other Topics Concern  . None   Social History Narrative   Lives at [place] since 02/06/2009   Widowed    No childred   Living Will    Family History Family Status  Relation Status Death Age  . Mother Deceased 19  . Father Deceased 36  . Sister Deceased 3    cause unknown  . Sister Deceased 50   Family History  Problem Relation Age of Onset  . Diabetes Mother   . Heart disease Mother     CHF  . Stroke Father   . Heart disease Sister     MI     Medications: Patient's Medications  New Prescriptions   No medications on file  Previous Medications   LOSARTAN (COZAAR) 50 MG TABLET    TAKE 1 TABLET ONCE DAILY TO CONTROL BLOOD PRESSURE.   MULTIPLE VITAMINS-MINERALS (ICAPS MV PO)    Take 2 tablets by mouth daily.  POLYVINYL ALCOHOL-POVIDONE (REFRESH) 1.4-0.6 % OPHTHALMIC SOLUTION    1-2 drops as needed.  Modified Medications   No medications on file  Discontinued Medications   GLUCOSE BLOOD (TRUETEST TEST) TEST STRIP    Test blood sugar twice daily   LOSARTAN (COZAAR) 100 MG TABLET    Take 50 mg by mouth daily.   PREDNISOLONE ACETATE (PRED FORTE) 1 % OPHTHALMIC SUSPENSION       PROLENSA 0.07 % SOLN        Immunization History  Administered Date(s) Administered  . Influenza Whole 03/30/2012, 04/13/2013  . Pneumococcal Polysaccharide-23 06/30/2001  . Td 06/30/2001     Review of Systems  Constitutional: Negative for fever, activity change, appetite change and fatigue.  HENT:  Positive for hearing loss.   Eyes: Positive for visual disturbance (macular degeneration).  Respiratory: Negative for shortness of breath.   Cardiovascular: Negative for chest pain.  Gastrointestinal: Positive for constipation. Negative for abdominal pain.  Endocrine:       Hx hyperthyroidism with left lobectomy and DM  Genitourinary: Negative for dysuria.  Musculoskeletal: Positive for arthralgias, back pain and myalgias.  Skin: Negative.   Neurological: Negative for dizziness, tremors and weakness.       Decreased vibratory sensation in both feet.  Hematological: Negative for adenopathy.  Psychiatric/Behavioral: Negative for behavioral problems and agitation.      Filed Vitals:   08/04/13 1502  BP: 146/60  Pulse: 84  Height: 5' 1.25" (1.556 m)  Weight: 114 lb (51.71 kg)   Physical Exam  Constitutional: She appears well-developed and well-nourished.  HENT:  Right Ear: External ear normal.  Left Ear: External ear normal.  Nose: Nose normal.  Mouth/Throat: Oropharynx is clear and moist. No oropharyngeal exudate.  Eyes: EOM are normal. Pupils are equal, round, and reactive to light.  Cataracts. Corrective lenses.  Neck: No JVD present. No tracheal deviation present. No thyromegaly present.  Cardiovascular: Normal rate, regular rhythm, normal heart sounds and intact distal pulses.  Exam reveals no gallop and no friction rub.   No murmur heard. Respiratory: No respiratory distress. She has no wheezes. She has no rales. She exhibits no tenderness.  GI: She exhibits no distension and no mass. There is no tenderness.  Genitourinary:  Vaginal atrophy  Musculoskeletal: Normal range of motion. She exhibits no edema and no tenderness.  Lymphadenopathy:    She has no cervical adenopathy.  Neurological: No cranial nerve deficit. Coordination normal.  Skin: No rash noted. No erythema.  Psychiatric: She has a normal mood and affect. Her behavior is normal. Judgment and thought content  normal.       Labs reviewed: Nursing Home on 08/04/2013  Component Date Value Range Status  . Glucose 07/26/2013 119   Final  . BUN 07/26/2013 19  4 - 21 mg/dL Final  . Creatinine 07/26/2013 0.7  0.5 - 1.1 mg/dL Final  . Potassium 07/26/2013 4.2  3.4 - 5.3 mmol/L Final  . Sodium 07/26/2013 140  137 - 147 mmol/L Final  . Triglycerides 07/26/2013 52  40 - 160 mg/dL Final  . Cholesterol 07/26/2013 168  0 - 200 mg/dL Final  . HDL 07/26/2013 64  35 - 70 mg/dL Final  . LDL Cholesterol 07/26/2013 94   Final  . Alkaline Phosphatase 07/26/2013 55  25 - 125 U/L Final  . ALT 07/26/2013 11  7 - 35 U/L Final  . AST 07/26/2013 18  13 - 35 U/L Final  . Bilirubin, Total 07/26/2013 0.6   Final  .  Hemoglobin A1C 07/26/2013 7.2* 4.0 - 6.0 % Final      Assessment/Plan 1. Unspecified essential hypertension controlled  2. Diabetes controlled  3. Hyperparathyroidism, unspecified controlled  4. Unspecified hereditary and idiopathic peripheral neuropathy unuchanged  5. Lumbago mmild and chronnic  6. Other and unspecified hyperlipidemia controlled  7. Scoliosis chronnic  8. Irritable bowel syndrome imoproved  9. GERD (gastroesophageal reflux disease) improved

## 2013-09-06 ENCOUNTER — Other Ambulatory Visit: Payer: Self-pay | Admitting: Internal Medicine

## 2013-12-01 DIAGNOSIS — I10 Essential (primary) hypertension: Secondary | ICD-10-CM | POA: Diagnosis not present

## 2013-12-01 DIAGNOSIS — E119 Type 2 diabetes mellitus without complications: Secondary | ICD-10-CM | POA: Diagnosis not present

## 2013-12-01 LAB — BASIC METABOLIC PANEL
BUN: 17 mg/dL (ref 4–21)
Creatinine: 0.8 mg/dL (ref 0.5–1.1)
Glucose: 113 mg/dL
Potassium: 4.2 mmol/L (ref 3.4–5.3)
Sodium: 136 mmol/L — AB (ref 137–147)

## 2013-12-01 LAB — HEMOGLOBIN A1C: Hgb A1c MFr Bld: 7 % — AB (ref 4.0–6.0)

## 2013-12-07 DIAGNOSIS — H353190 Nonexudative age-related macular degeneration, unspecified eye, stage unspecified: Secondary | ICD-10-CM | POA: Diagnosis not present

## 2013-12-07 DIAGNOSIS — H04129 Dry eye syndrome of unspecified lacrimal gland: Secondary | ICD-10-CM | POA: Diagnosis not present

## 2013-12-08 ENCOUNTER — Non-Acute Institutional Stay: Payer: Medicare Other | Admitting: Internal Medicine

## 2013-12-08 ENCOUNTER — Encounter: Payer: Self-pay | Admitting: Internal Medicine

## 2013-12-08 VITALS — BP 130/62 | HR 68 | Wt 114.0 lb

## 2013-12-08 DIAGNOSIS — R413 Other amnesia: Secondary | ICD-10-CM

## 2013-12-08 DIAGNOSIS — M545 Low back pain, unspecified: Secondary | ICD-10-CM

## 2013-12-08 DIAGNOSIS — I10 Essential (primary) hypertension: Secondary | ICD-10-CM | POA: Diagnosis not present

## 2013-12-08 DIAGNOSIS — K589 Irritable bowel syndrome without diarrhea: Secondary | ICD-10-CM

## 2013-12-08 DIAGNOSIS — E119 Type 2 diabetes mellitus without complications: Secondary | ICD-10-CM

## 2013-12-08 NOTE — Progress Notes (Signed)
Patient ID: [name], female   DOB: February 12, 1926, 80 y.o.   MRN: [id number]    Location:  [facility]   Place of Service: Clinic (12)    Allergies  Allergen Reactions  . Bactrim     unknown  . Betadine [Povidone Iodine]     unknown  . Metformin And Related     Hurt stomach  . Nutmeg Oil (Myristica Oil)     unknown  . Sulfa Antibiotics     unknown    Chief Complaint  Patient presents with  . Medical Management of Chronic Issues    blood pressure, thyroid, blood sugar    HPI:  Unspecified essential hypertension: controlled  Diabetes: controlled adequately. No medication. Sort of watches her diet  Irritable bowel syndrome: stable. Some constipation.  Lumbago: chronic low back pain.  Memory loss: stable     Medications: Patient's Medications  New Prescriptions   No medications on file  Previous Medications   LOSARTAN (COZAAR) 50 MG TABLET    TAKE 1 TABLET ONCE DAILY TO CONTROL BLOOD PRESSURE.   LOTEPREDNOL (ALREX) 0.2 % SUSP    1 drop. One drop in left eye three times daily for dry eyes   MULTIPLE VITAMINS-MINERALS (ICAPS MV PO)    Take 2 tablets by mouth daily.   POLYVINYL ALCOHOL-POVIDONE (REFRESH) 1.4-0.6 % OPHTHALMIC SOLUTION    1-2 drops as needed.  Modified Medications   No medications on file  Discontinued Medications   No medications on file     Review of Systems  Constitutional: Negative for fever, activity change, appetite change and fatigue.  HENT: Positive for hearing loss.   Eyes: Positive for visual disturbance (macular degeneration).  Respiratory: Negative for shortness of breath.   Cardiovascular: Negative for chest pain.  Gastrointestinal: Positive for constipation. Negative for abdominal pain.  Endocrine:       Hx hyperthyroidism with left lobectomy and DM  Genitourinary: Negative for dysuria.  Musculoskeletal: Positive for arthralgias, back pain and myalgias.  Skin: Negative.   Neurological: Negative for dizziness,  tremors and weakness.       Decreased vibratory sensation in both feet.  Hematological: Negative for adenopathy.  Psychiatric/Behavioral: Negative for behavioral problems and agitation.    Filed Vitals:   12/08/13 1527  BP: 130/62  Pulse: 68  Weight: 114 lb (51.71 kg)   Body mass index is 21.36 kg/(m^2).  Physical Exam  Constitutional: She is oriented to person, place, and time. She appears well-developed and well-nourished. No distress.  HENT:  Head: Normocephalic and atraumatic.  Right Ear: External ear normal.  Left Ear: External ear normal.  Nose: Nose normal.  Mouth/Throat: Oropharynx is clear and moist.  Eyes: Conjunctivae and EOM are normal. Pupils are equal, round, and reactive to light.  Corrective lenses.Cataracts.  Neck: Normal range of motion. Neck supple. No JVD present. No tracheal deviation present. No thyromegaly present.  Cardiovascular: Normal rate, regular rhythm, normal heart sounds and intact distal pulses.  Exam reveals no gallop and no friction rub.   No murmur heard. Pulmonary/Chest: Effort normal and breath sounds normal. No respiratory distress. She has no wheezes. She has no rales.  Abdominal: Soft. Bowel sounds are normal. She exhibits no distension and no mass. There is no tenderness.  Musculoskeletal: Normal range of motion. She exhibits no edema and no tenderness.  Lymphadenopathy:    She has no cervical adenopathy.  Neurological: She is alert and oriented to person, place, and time. No cranial nerve deficit.  Skin: No rash noted. She is not diaphoretic. No erythema. No pallor.  Psychiatric: She has a normal mood and affect. Her behavior is normal. Thought content normal.     Labs reviewed: Nursing Home on 12/08/2013  Component Date Value Ref Range Status  . Glucose 12/01/2013 113   Final  . BUN 12/01/2013 17  4 - 21 mg/dL Final  . Creatinine 12/01/2013 0.8  0.5 - 1.1 mg/dL Final  . Potassium 12/01/2013 4.2  3.4 - 5.3 mmol/L Final  . Sodium  12/01/2013 136* 137 - 147 mmol/L Final  . Hemoglobin A1C 12/01/2013 7.0* 4.0 - 6.0 % Final      Assessment/Plan  1. Unspecified essential hypertension controlled  2. Diabetes controlled  3. Irritable bowel syndrome stable  4. Lumbago unchanged  5. Memory loss No change

## 2013-12-19 ENCOUNTER — Encounter: Payer: Self-pay | Admitting: Internal Medicine

## 2013-12-21 DIAGNOSIS — H26499 Other secondary cataract, unspecified eye: Secondary | ICD-10-CM | POA: Diagnosis not present

## 2013-12-21 DIAGNOSIS — H04129 Dry eye syndrome of unspecified lacrimal gland: Secondary | ICD-10-CM | POA: Diagnosis not present

## 2013-12-26 DIAGNOSIS — H1045 Other chronic allergic conjunctivitis: Secondary | ICD-10-CM | POA: Diagnosis not present

## 2013-12-28 ENCOUNTER — Ambulatory Visit (INDEPENDENT_AMBULATORY_CARE_PROVIDER_SITE_OTHER): Payer: Medicare Other | Admitting: Emergency Medicine

## 2013-12-28 ENCOUNTER — Ambulatory Visit (HOSPITAL_COMMUNITY)
Admission: RE | Admit: 2013-12-28 | Discharge: 2013-12-28 | Disposition: A | Discharge status: Home / Self Care | Payer: Medicare Other | Source: Ambulatory Visit | Attending: Emergency Medicine | Admitting: Emergency Medicine

## 2013-12-28 VITALS — BP 142/86 | HR 92 | Temp 98.5°F | Resp 16 | Ht 61.5 in | Wt 111.6 lb

## 2013-12-28 DIAGNOSIS — F29 Unspecified psychosis not due to a substance or known physiological condition: Secondary | ICD-10-CM | POA: Diagnosis not present

## 2013-12-28 DIAGNOSIS — R42 Dizziness and giddiness: Secondary | ICD-10-CM

## 2013-12-28 DIAGNOSIS — R51 Headache: Secondary | ICD-10-CM

## 2013-12-28 LAB — COMPREHENSIVE METABOLIC PANEL
ALT: 14 U/L (ref 0–35)
AST: 17 U/L (ref 0–37)
Albumin: 4.4 g/dL (ref 3.5–5.2)
Alkaline Phosphatase: 55 U/L (ref 39–117)
BUN: 17 mg/dL (ref 6–23)
CO2: 26 mEq/L (ref 19–32)
Calcium: 10.8 mg/dL — ABNORMAL HIGH (ref 8.4–10.5)
Chloride: 102 mEq/L (ref 96–112)
Creat: 0.7 mg/dL (ref 0.50–1.10)
Glucose, Bld: 222 mg/dL — ABNORMAL HIGH (ref 70–99)
Potassium: 5 mEq/L (ref 3.5–5.3)
Sodium: 137 mEq/L (ref 135–145)
Total Bilirubin: 0.7 mg/dL (ref 0.2–1.2)
Total Protein: 6.8 g/dL (ref 6.0–8.3)

## 2013-12-28 LAB — CBC
HCT: 40.4 % (ref 36.0–46.0)
Hemoglobin: 14 g/dL (ref 12.0–15.0)
MCH: 30.5 pg (ref 26.0–34.0)
MCHC: 34.7 g/dL (ref 30.0–36.0)
MCV: 88 fL (ref 78.0–100.0)
Platelets: 274 10*3/uL (ref 150–400)
RBC: 4.59 MIL/uL (ref 3.87–5.11)
RDW: 13.6 % (ref 11.5–15.5)
WBC: 9.3 10*3/uL (ref 4.0–10.5)

## 2013-12-28 NOTE — Progress Notes (Signed)
[facility] [address] [place] [address] [phone]  Date:  12/28/2013   Name:  [name]   DOB:  12-Aug-1925   MRN:  [id number]  PCP:  Estill Dooms, MD    Chief Complaint: Headache, Dizziness, bloodshot eyes and itchy eyes   History of Present Illness:  [name] is a 80 y.o. very pleasant female patient who presents with the following:  Multiple complaints. On Saturday she developed bilateral red eyes with itching and watery drainage and a severe headache.  Says it feels like a rubber band around her head.Martin Majestic Monday to ophthalmologist for follow up of cataract surgery in November and was treated with an eye drop.  She has decreased eye symptoms.  Yesterday developed dizziness and today is more confused than usual.  She has some expressive difficulty.  Denies falls, head injury or LOC.  No numbness tingling or weakness of the extremities.  No improvement with over the counter medications or other home remedies. Denies other complaint or health concern today.   Patient Active Problem List   Diagnosis Date Noted  . Scoliosis 08/04/2013  . GERD (gastroesophageal reflux disease) 02/17/2013  . Irritable bowel syndrome 02/17/2013  . Lumbago 02/17/2013  . Hyperparathyroidism, unspecified 02/17/2013  . Unspecified vitamin D deficiency 02/17/2013  . Other and unspecified hyperlipidemia   . Unspecified hereditary and idiopathic peripheral neuropathy   . Unspecified essential hypertension   . Memory loss   . Diabetes 08/27/2012    Past Medical History  Diagnosis Date  . Type II or unspecified type diabetes mellitus without mention of complication, uncontrolled   . Keratoconjunctivitis sicca, not specified as Sjogren's   . Loss of weight   . Unspecified vitamin D deficiency   . Contact dermatitis and other eczema, due to unspecified cause   . Cortical senile cataract   . Irritable bowel syndrome   . Other and unspecified hyperlipidemia   .  Unspecified hereditary and idiopathic peripheral neuropathy   . Macular degeneration (senile) of retina, unspecified   . Unspecified essential hypertension   . Reflux esophagitis   . Pain in joint, site unspecified   . Pathologic fracture of vertebrae   . Scoliosis (and kyphoscoliosis), idiopathic   . Memory loss   . Senile osteoporosis   . GERD (gastroesophageal reflux disease) 02/17/2013  . Other malaise and fatigue 06/17/2010  . Hyperparathyroidism, unspecified 03/26/2010    Past Surgical History  Procedure Laterality Date  . Appendectomy    . Spine surgery      vertebroplasty T10, T12  . Spine surgery  11/02/2001     C4-5 & C5-6 Titanium Plate Placed Dr. Carloyn Manner  . Medial partial knee replacement Left 2004    Dr. Ronnie Derby  . Fibroidectomy  1950  . Hemorrhoid surgery  1978  . Cholecystectomy  2004    Dr. Zella Richer  . Thyroidectomy, partial Left 2007  . Kyphoplasty  2010    T10 & T12 Dr. Erline Levine  . Breast lumpectomy  02/05/2010    Dr. Christie Beckers  . Total knee arthroplasty Right 2009    Dr. Alvan Dame   . Colonoscopy  2001    Dr. Collene Mares  . Cataract extraction w/ intraocular lens  implant, bilateral  04/2013    Dr. Bing Plume    History  Substance Use Topics  . Smoking status: Never Smoker   . Smokeless tobacco: Never Used  . Alcohol Use: 0.5 oz/week  1 drink(s) per week     Comment: pcc glass of wine    Family History  Problem Relation Age of Onset  . Diabetes Mother   . Heart disease Mother     CHF  . Stroke Father   . Heart disease Sister     MI    Allergies  Allergen Reactions  . Bactrim     unknown  . Betadine [Povidone Iodine]     unknown  . Metformin And Related     Hurt stomach  . Nutmeg Oil (Myristica Oil)     unknown  . Sulfa Antibiotics     unknown    Medication list has been reviewed and updated.  Current Outpatient Prescriptions on File Prior to Visit  Medication Sig Dispense Refill  . losartan (COZAAR) 50 MG tablet TAKE 1 TABLET ONCE DAILY TO  CONTROL BLOOD PRESSURE.  60 tablet  3  . loteprednol (ALREX) 0.2 % SUSP 1 drop. One drop in left eye three times daily for dry eyes      . Multiple Vitamins-Minerals (ICAPS MV PO) Take 2 tablets by mouth daily.      . polyvinyl alcohol-povidone (REFRESH) 1.4-0.6 % ophthalmic solution 1-2 drops as needed.       No current facility-administered medications on file prior to visit.    Review of Systems:  As per HPI, otherwise negative.    Physical Examination: Filed Vitals:   12/28/13 1010  BP: 142/86  Pulse: 92  Temp: 98.5 F (36.9 C)  Resp: 16   Filed Vitals:   12/28/13 1010  Height: 5' 1.5" (1.562 m)  Weight: 111 lb 9.6 oz (50.621 kg)   Body mass index is 20.75 kg/(m^2). Ideal Body Weight: Weight in (lb) to have BMI = 25: 134.2  GEN: WDWN, NAD, Non-toxic, A & O x 3 HEENT: Atraumatic, Normocephalic. Neck supple. No masses, No LAD. Ears and Nose: No external deformity. CV: RRR, No M/G/R. No JVD. No thrill. No extra heart sounds. PULM: CTA B, no wheezes, crackles, rhonchi. No retractions. No resp. distress. No accessory muscle use. ABD: S, NT, ND, +BS. No rebound. No HSM. EXTR: No c/c/e NEURO cane assisted gait.  PSYCH: Normally interactive. Conversant. Not depressed or anxious appearing.  Calm demeanor.    Assessment and Plan: Headache and dizziness CT Follow up with [facility]   Signed,  Ellison Carwin, MD

## 2013-12-29 DIAGNOSIS — N390 Urinary tract infection, site not specified: Secondary | ICD-10-CM | POA: Diagnosis not present

## 2014-01-03 ENCOUNTER — Ambulatory Visit: Payer: Self-pay | Admitting: Internal Medicine

## 2014-01-03 ENCOUNTER — Encounter: Payer: Self-pay | Admitting: Internal Medicine

## 2014-01-03 ENCOUNTER — Ambulatory Visit (INDEPENDENT_AMBULATORY_CARE_PROVIDER_SITE_OTHER): Payer: Medicare Other | Admitting: Internal Medicine

## 2014-01-03 VITALS — BP 140/70 | HR 96 | Temp 96.4°F | Resp 18 | Ht 61.0 in | Wt 112.8 lb

## 2014-01-03 DIAGNOSIS — R350 Frequency of micturition: Secondary | ICD-10-CM

## 2014-01-03 DIAGNOSIS — R51 Headache: Secondary | ICD-10-CM | POA: Diagnosis not present

## 2014-01-03 NOTE — Patient Instructions (Signed)
Stop your eye drops and see your eye doctor befor 01/13/14 if possible Drink plenty of water If you develop any urinary symptoms, please notify us

## 2014-01-03 NOTE — Progress Notes (Signed)
Patient ID: [name], female   DOB: [birth year], 80 y.o.   MRN: [id number]    Chief Complaint  Patient presents with  . Acute Visit    headache x 2 wks, concern of uti   Allergies  Allergen Reactions  . Bactrim     unknown  . Betadine [Povidone Iodine]     unknown  . Metformin And Related     Hurt stomach  . Nutmeg Oil (Myristica Oil)     unknown  . Sulfa Antibiotics     unknown   HPI 80 y/o female patient is here for follow up on her headache. She has been having frontal headache on and off for 2 weeks. She was seen in urgent care few days back and had ct head which ruled out any acute process. She was started on loteprendol few weeks back for her eyes and noticed headache soon after. She has stopped these eye drops for now. No headache today. She also had some dizziness with sudden change of position, none today though. She is using a cane to move around to help prevent her dizziness and accident Denies runny nose or sore throat or sinus congestion Has cut down on voluntary work and her going to pool 3 days a week. She has not been getting out much as before recently and this is stressing her out. She had u/a wit culture sent in urgent care and has result here for review. It grew < 50,000 morganella morganii on review Has some increased urinary frequency during day but denies any dysuria, hematuria, abdominal or flank pain No nausea or vomiting No change of vision Has discomfort behind her eyes Also concerned about her DM with her oral hypoglycemic stopped a month back and her cbg has been above 120 recently  Review of Systems  Constitutional: Negative for fever, chills, diaphoresis.  HENT: Negative for congestion, hearing loss and sore throat.   Eyes: Negative for blurred vision Respiratory: Negative for cough, sputum production, shortness of breath and wheezing.   Cardiovascular: Negative for chest pain, palpitations Musculoskeletal: Negative for myalgias.  Skin:  Negative for itching and rash.  Neurological: Negative for dizziness, tingling, focal weakness and headaches.  Psychiatric/Behavioral: Negative for depression   Past Medical History  Diagnosis Date  . Type II or unspecified type diabetes mellitus without mention of complication, uncontrolled   . Keratoconjunctivitis sicca, not specified as Sjogren's   . Loss of weight   . Unspecified vitamin D deficiency   . Contact dermatitis and other eczema, due to unspecified cause   . Cortical senile cataract   . Irritable bowel syndrome   . Other and unspecified hyperlipidemia   . Unspecified hereditary and idiopathic peripheral neuropathy   . Macular degeneration (senile) of retina, unspecified   . Unspecified essential hypertension   . Reflux esophagitis   . Pain in joint, site unspecified   . Pathologic fracture of vertebrae   . Scoliosis (and kyphoscoliosis), idiopathic   . Memory loss   . Senile osteoporosis   . GERD (gastroesophageal reflux disease) 02/17/2013  . Other malaise and fatigue 06/17/2010  . Hyperparathyroidism, unspecified 03/26/2010   Current Outpatient Prescriptions on File Prior to Visit  Medication Sig Dispense Refill  . losartan (COZAAR) 50 MG tablet TAKE 1 TABLET ONCE DAILY TO CONTROL BLOOD PRESSURE.  60 tablet  3  . Multiple Vitamins-Minerals (ICAPS MV PO) Take 2 tablets by mouth daily.      . polyvinyl alcohol-povidone (REFRESH) 1.4-0.6 % ophthalmic solution  1-2 drops as needed.      . loteprednol (ALREX) 0.2 % SUSP 1 drop. One drop in left eye three times daily for dry eyes       No current facility-administered medications on file prior to visit.   Physical exam BP 140/70  Pulse 96  Temp(Src) 96.4 F (35.8 C) (Oral)  Resp 18  Ht _0  (1.549 m)  Wt 112 lb 12.8 oz (51.166 kg)  BMI 21.32 kg/m2  SpO2 97%  General- elderly female in no acute distress Head- atraumatic, normocephalic, no temporal or sinus tenderness Eyes- PERRLA, EOMI, no pallor, no icterus,  no discharge Neck- no lymphadenopathy Cardiovascular- normal s1,s2, no murmurs Respiratory- bilateral clear to auscultation, no wheeze Abdomen- bowel sounds present, soft, non tender Musculoskeletal- able to move all 4 extremities, no leg edema Neurological- no focal deficit Skin- warm and dry Psychiatry- alert and oriented to person, place and time, normal mood and affect  Labs- CBC    Component Value Date/Time   WBC 9.3 12/28/2013 1113   WBC 10.7* 08/27/2012 1417   RBC 4.59 12/28/2013 1113   RBC 4.53 08/27/2012 1417   HGB 14.0 12/28/2013 1113   HGB 13.4 08/27/2012 1417   HCT 40.4 12/28/2013 1113   HCT 42.6 08/27/2012 1417   PLT 274 12/28/2013 1113   MCV 88.0 12/28/2013 1113   MCV 94.0 08/27/2012 1417   MCH 30.5 12/28/2013 1113   MCH 29.6 08/27/2012 1417   MCHC 34.7 12/28/2013 1113   MCHC 31.5* 08/27/2012 1417   RDW 13.6 12/28/2013 1113   LYMPHSABS 1.6 09/09/2011 1020   MONOABS 0.5 09/09/2011 1020   EOSABS 0.1 09/09/2011 1020   BASOSABS 0.0 09/09/2011 1020    Lab Results  Component Value Date   CREATININE 0.70 12/28/2013   CT head 12/28/13 IMPRESSION: No acute intracranial findings. Mild periventricular white matter disease, likely reflecting chronic small vessel ischemic changes.  Assessment/plan  1. Headache(784.0) Rule out temporal arteritis, check ESR. Reviewed ct head. No signs of sinus infection. Can take aleve prn for headache. Also to see her eye doctor prior to scheduled 01/13/14 if the headache persists or worsens. Stress could be contributing some to it. Relaxation techniques advised. Reassess if no improvement - Sedimentation Rate  2. Urinary frequency No dysuria. reveiwed u/a and culture report. With bacterial load< 100,000, will not start her antibiotics for now. Reassess if symptoms worsen. Encouraged hydration

## 2014-01-04 ENCOUNTER — Other Ambulatory Visit: Payer: Self-pay | Admitting: Internal Medicine

## 2014-01-04 LAB — SEDIMENTATION RATE: Sed Rate: 2 mm/hr (ref 0–40)

## 2014-01-06 ENCOUNTER — Encounter: Payer: Self-pay | Admitting: *Deleted

## 2014-01-11 ENCOUNTER — Encounter: Payer: Self-pay | Admitting: Internal Medicine

## 2014-01-12 DIAGNOSIS — H26499 Other secondary cataract, unspecified eye: Secondary | ICD-10-CM | POA: Diagnosis not present

## 2014-04-17 DIAGNOSIS — Z23 Encounter for immunization: Secondary | ICD-10-CM | POA: Diagnosis not present

## 2014-05-30 DIAGNOSIS — E785 Hyperlipidemia, unspecified: Secondary | ICD-10-CM | POA: Diagnosis not present

## 2014-05-30 DIAGNOSIS — E119 Type 2 diabetes mellitus without complications: Secondary | ICD-10-CM | POA: Diagnosis not present

## 2014-05-30 LAB — BASIC METABOLIC PANEL
BUN: 19 mg/dL (ref 4–21)
Creatinine: 0.7 mg/dL (ref 0.5–1.1)
Glucose: 127 mg/dL
Potassium: 4.6 mmol/L (ref 3.4–5.3)
Sodium: 139 mmol/L (ref 137–147)

## 2014-05-30 LAB — LIPID PANEL
Cholesterol: 178 mg/dL (ref 0–200)
HDL: 72 mg/dL — AB (ref 35–70)
LDL Cholesterol: 92 mg/dL
LDl/HDL Ratio: 2.5
Triglycerides: 72 mg/dL (ref 40–160)

## 2014-05-30 LAB — HEMOGLOBIN A1C: Hgb A1c MFr Bld: 7.4 % — AB (ref 4.0–6.0)

## 2014-06-08 ENCOUNTER — Non-Acute Institutional Stay: Payer: Medicare Other | Admitting: Internal Medicine

## 2014-06-08 ENCOUNTER — Encounter: Payer: Self-pay | Admitting: Internal Medicine

## 2014-06-08 VITALS — BP 132/56 | HR 80 | Temp 97.3°F | Wt 114.0 lb

## 2014-06-08 DIAGNOSIS — E1142 Type 2 diabetes mellitus with diabetic polyneuropathy: Secondary | ICD-10-CM

## 2014-06-08 DIAGNOSIS — R413 Other amnesia: Secondary | ICD-10-CM | POA: Diagnosis not present

## 2014-06-08 DIAGNOSIS — E213 Hyperparathyroidism, unspecified: Secondary | ICD-10-CM | POA: Diagnosis not present

## 2014-06-08 DIAGNOSIS — G629 Polyneuropathy, unspecified: Secondary | ICD-10-CM

## 2014-06-08 DIAGNOSIS — I10 Essential (primary) hypertension: Secondary | ICD-10-CM

## 2014-06-08 DIAGNOSIS — M25562 Pain in left knee: Secondary | ICD-10-CM | POA: Diagnosis not present

## 2014-06-08 NOTE — Progress Notes (Signed)
Passed clock drawing 

## 2014-06-12 DIAGNOSIS — M25562 Pain in left knee: Secondary | ICD-10-CM | POA: Insufficient documentation

## 2014-06-12 NOTE — Progress Notes (Signed)
Patient ID: [name], female   DOB: 28-Jul-1925, 80 y.o.   MRN: [id number]    [facility]     [facility] of [facility] ([facility]    Allergies  Allergen Reactions  . Bactrim     unknown  . Betadine [Povidone Iodine]     unknown  . Metformin And Related     Hurt stomach  . Nutmeg Oil (Myristica Oil)     unknown  . Sulfa Antibiotics     unknown    Chief Complaint  Patient presents with  . Medical Management of Chronic Issues    blood pressure, blood sugar, memory    HPI:  Essential hypertension: Controlled  Pain in left knee; long-standing history. She has history of knee replacement in 2004 by Dr. Lorre Nick.  DM type 2 with diabetic peripheral neuropathy: Controlled  Memory loss: Unchanged  Hyperparathyroidism: Corrected by surgery    Medications: Patient's Medications  New Prescriptions   No medications on file  Previous Medications   LOSARTAN (COZAAR) 50 MG TABLET    TAKE 1 TABLET ONCE DAILY TO CONTROL BLOOD PRESSURE.   MULTIPLE VITAMINS-MINERALS (ICAPS MV PO)    Take 2 tablets by mouth daily.   POLYVINYL ALCOHOL-POVIDONE (REFRESH) 1.4-0.6 % OPHTHALMIC SOLUTION    1-2 drops as needed.  Modified Medications   No medications on file  Discontinued Medications   LOTEPREDNOL (ALREX) 0.2 % SUSP    1 drop. One drop in left eye three times daily for dry eyes     Review of Systems  Constitutional: Negative for fever, activity change, appetite change and fatigue.  HENT: Positive for hearing loss.   Eyes: Positive for visual disturbance (macular degeneration).  Respiratory: Negative for shortness of breath.   Cardiovascular: Negative for chest pain.  Gastrointestinal: Positive for constipation. Negative for abdominal pain.  Endocrine:       Hx hyperthyroidism with left lobectomy and DM  Genitourinary: Negative for dysuria.  Musculoskeletal: Positive for myalgias, back pain and arthralgias.  Skin: Negative.   Neurological: Negative for dizziness,  tremors and weakness.       Decreased vibratory sensation in both feet.  Hematological: Negative for adenopathy.  Psychiatric/Behavioral: Negative for behavioral problems and agitation.    Filed Vitals:   06/08/14 1527  BP: 132/56  Pulse: 80  Temp: 97.3 F (36.3 C)  TempSrc: Oral  Weight: 114 lb (51.71 kg)   Body mass index is 21.55 kg/(m^2).  Physical Exam  Constitutional: She is oriented to person, place, and time. She appears well-developed and well-nourished. No distress.  HENT:  Head: Normocephalic and atraumatic.  Right Ear: External ear normal.  Left Ear: External ear normal.  Nose: Nose normal.  Mouth/Throat: Oropharynx is clear and moist.  Eyes: Conjunctivae and EOM are normal. Pupils are equal, round, and reactive to light.  Corrective lenses.Cataracts.  Neck: Normal range of motion. Neck supple. No JVD present. No tracheal deviation present. No thyromegaly present.  Cardiovascular: Normal rate, regular rhythm, normal heart sounds and intact distal pulses.  Exam reveals no gallop and no friction rub.   No murmur heard. Pulmonary/Chest: Effort normal and breath sounds normal. No respiratory distress. She has no wheezes. She has no rales.  Abdominal: Soft. Bowel sounds are normal. She exhibits no distension and no mass. There is no tenderness.  Musculoskeletal: Normal range of motion. She exhibits no edema or tenderness.  Lymphadenopathy:    She has no cervical adenopathy.  Neurological: She is alert and oriented to person,  place, and time. No cranial nerve deficit.  02/17/14 MMSE 29/30 06/13/14 MMSE 30/30. Passed clock drawing.  Skin: No rash noted. She is not diaphoretic. No erythema. No pallor.  Psychiatric: She has a normal mood and affect. Her behavior is normal. Thought content normal.     Labs reviewed: [facility] on 06/08/2014  Component Date Value Ref Range Status  . Glucose 05/30/2014 127   Final  . BUN 05/30/2014 19  4 - 21 mg/dL Final  . Creatinine  05/30/2014 0.7  0.5 - 1.1 mg/dL Final  . Potassium 05/30/2014 4.6  3.4 - 5.3 mmol/L Final  . Sodium 05/30/2014 139  137 - 147 mmol/L Final  . LDl/HDL Ratio 05/30/2014 2.5   Final  . Triglycerides 05/30/2014 72  40 - 160 mg/dL Final  . Cholesterol 05/30/2014 178  0 - 200 mg/dL Final  . HDL 05/30/2014 72* 35 - 70 mg/dL Final  . LDL Cholesterol 05/30/2014 92   Final  . Hgb A1c MFr Bld 05/30/2014 7.4* 4.0 - 6.0 % Final     Assessment/Plan  1. Essential hypertension Controlled  2. Pain in left knee Recommended she see Dr. Lorre Nick again  3. DM type 2 with diabetic peripheral neuropathy Controlled  4. Memory loss Unchanged  5. Hyperparathyroidism Corrected by surgery

## 2014-07-03 ENCOUNTER — Other Ambulatory Visit: Payer: Self-pay | Admitting: Internal Medicine

## 2014-07-11 DIAGNOSIS — L602 Onychogryphosis: Secondary | ICD-10-CM | POA: Diagnosis not present

## 2014-07-11 DIAGNOSIS — E119 Type 2 diabetes mellitus without complications: Secondary | ICD-10-CM | POA: Diagnosis not present

## 2014-07-11 DIAGNOSIS — M2011 Hallux valgus (acquired), right foot: Secondary | ICD-10-CM | POA: Diagnosis not present

## 2014-07-11 DIAGNOSIS — L84 Corns and callosities: Secondary | ICD-10-CM | POA: Diagnosis not present

## 2014-08-08 DIAGNOSIS — H26492 Other secondary cataract, left eye: Secondary | ICD-10-CM | POA: Diagnosis not present

## 2014-08-08 DIAGNOSIS — H3531 Nonexudative age-related macular degeneration: Secondary | ICD-10-CM | POA: Diagnosis not present

## 2014-08-08 DIAGNOSIS — H04123 Dry eye syndrome of bilateral lacrimal glands: Secondary | ICD-10-CM | POA: Diagnosis not present

## 2014-12-12 DIAGNOSIS — E785 Hyperlipidemia, unspecified: Secondary | ICD-10-CM | POA: Diagnosis not present

## 2014-12-12 DIAGNOSIS — I10 Essential (primary) hypertension: Secondary | ICD-10-CM | POA: Diagnosis not present

## 2014-12-12 LAB — LIPID PANEL
Cholesterol: 152 mg/dL (ref 0–200)
HDL: 56 mg/dL (ref 35–70)
LDL Cholesterol: 75 mg/dL
Triglycerides: 105 mg/dL (ref 40–160)

## 2014-12-12 LAB — HEPATIC FUNCTION PANEL
ALT: 9 U/L (ref 7–35)
AST: 16 U/L (ref 13–35)
Alkaline Phosphatase: 61 U/L (ref 25–125)
Bilirubin, Total: 0.5 mg/dL

## 2014-12-12 LAB — BASIC METABOLIC PANEL
BUN: 14 mg/dL (ref 4–21)
Creatinine: 0.8 mg/dL (ref 0.5–1.1)
Glucose: 151 mg/dL
Potassium: 4 mmol/L (ref 3.4–5.3)
Sodium: 139 mmol/L (ref 137–147)

## 2014-12-13 ENCOUNTER — Encounter: Payer: Self-pay | Admitting: *Deleted

## 2014-12-19 DIAGNOSIS — Z791 Long term (current) use of non-steroidal anti-inflammatories (NSAID): Secondary | ICD-10-CM | POA: Diagnosis not present

## 2014-12-19 DIAGNOSIS — E213 Hyperparathyroidism, unspecified: Secondary | ICD-10-CM | POA: Diagnosis not present

## 2014-12-21 ENCOUNTER — Encounter: Payer: Self-pay | Admitting: Internal Medicine

## 2014-12-21 ENCOUNTER — Non-Acute Institutional Stay: Payer: Medicare Other | Admitting: Internal Medicine

## 2014-12-21 VITALS — BP 130/70 | HR 96 | Temp 97.7°F | Wt 113.0 lb

## 2014-12-21 DIAGNOSIS — M545 Low back pain: Secondary | ICD-10-CM

## 2014-12-21 DIAGNOSIS — E1142 Type 2 diabetes mellitus with diabetic polyneuropathy: Secondary | ICD-10-CM

## 2014-12-21 DIAGNOSIS — R059 Cough, unspecified: Secondary | ICD-10-CM

## 2014-12-21 DIAGNOSIS — G629 Polyneuropathy, unspecified: Secondary | ICD-10-CM | POA: Diagnosis not present

## 2014-12-21 DIAGNOSIS — I10 Essential (primary) hypertension: Secondary | ICD-10-CM | POA: Diagnosis not present

## 2014-12-21 DIAGNOSIS — R05 Cough: Secondary | ICD-10-CM

## 2014-12-21 DIAGNOSIS — R413 Other amnesia: Secondary | ICD-10-CM | POA: Diagnosis not present

## 2014-12-21 DIAGNOSIS — E785 Hyperlipidemia, unspecified: Secondary | ICD-10-CM

## 2014-12-21 NOTE — Progress Notes (Signed)
Patient ID: [name], female   DOB: [birth year], 81 y.o.   MRN: [id number]

## 2014-12-21 NOTE — Progress Notes (Signed)
Patient ID: [name], female   DOB: 05-28-1926, 81 y.o.   MRN: [id number]    [facility]     [facility] of [facility] (12[facility]     Allergies  Allergen Reactions  . Bactrim     unknown  . Betadine [Povidone Iodine]     unknown  . Metformin And Related     Hurt stomach  . Nutmeg Oil (Myristica Oil)     unknown  . Sulfa Antibiotics     unknown    Chief Complaint  Patient presents with  . Medical Management of Chronic Issues    blood  pressure, blood sugar, memory  . Cough    for 2 days, cold for one week    HPI:  Cough: Onset 1 week ago, nonproductive. Not keeping her awake at night, has not been bad enough to take any OTCs. No fevers, chills or associated symptoms.   DM type 2 with diabetic peripheral neuropathy: A1C 6 months ago was 7.4. Fasting glucose on 12/12/14 remained elevated at 151. However she reports when she checks a fasting BSG herself her numbers are typically less than 120. Currently managed without medications. No polyuria, polydipsia or visual changes.  Midline low back pain, with sciatica presence unspecified: Ongoing complaint. Able to walk a mile a day and hoping to get back to the pool. In the past water aerobics have improved pain.  Essential hypertension: Mildly elevated today at 144/64, improved to 130/60 on recheck  Memory loss: Stable  Hyperlipidemia: Labs unremarkable    Medications: Patient's Medications  New Prescriptions   No medications on file  Previous Medications   LOSARTAN (COZAAR) 50 MG TABLET    TAKE 1 TABLET ONCE DAILY TO CONTROL BLOOD PRESSURE.   MULTIPLE VITAMINS-MINERALS (ICAPS MV PO)    Take 2 tablets by mouth daily.   POLYVINYL ALCOHOL-POVIDONE (REFRESH) 1.4-0.6 % OPHTHALMIC SOLUTION    1-2 drops as needed.   TRUEPLUS LANCETS 28G MISC    CHECK BLOOD SUGAR TWICE DAILY.   TRUETEST TEST TEST STRIP    CHECK BLOOD SUGAR TWICE DAILY.  Modified Medications   No medications on file  Discontinued Medications   No medications on file     Review of Systems  Constitutional: Negative for fever, activity change, appetite change and fatigue.  HENT: Positive for hearing loss.   Eyes: Positive for visual disturbance (macular degeneration).  Respiratory: Positive for cough. Negative for shortness of breath.   Cardiovascular: Negative for chest pain.  Gastrointestinal: Positive for constipation. Negative for abdominal pain.  Endocrine:       Hx hyperthyroidism with left lobectomy and DM  Genitourinary: Negative for dysuria, urgency, frequency and hematuria.  Musculoskeletal: Positive for myalgias, back pain and arthralgias.  Skin: Negative.   Neurological: Negative for dizziness, tremors and weakness.       Decreased vibratory sensation in both feet.  Hematological: Negative for adenopathy.  Psychiatric/Behavioral: Negative for behavioral problems and agitation.    Filed Vitals:   12/21/14 1407  BP: 144/64  Pulse: 96  Temp: 97.7 F (36.5 C)  TempSrc: Oral  Weight: 113 lb (51.256 kg)  SpO2: 99%   Body mass index is 21.36 kg/(m^2).  Physical Exam  Constitutional: She is oriented to person, place, and time. She appears well-developed and well-nourished. No distress.  HENT:  Head: Normocephalic and atraumatic.  Right Ear: External ear normal.  Left Ear: External ear normal.  Nose: Nose normal.  Mouth/Throat: Oropharynx is clear and moist.  Eyes:  Conjunctivae and EOM are normal. Pupils are equal, round, and reactive to light.  Corrective lenses. Cataracts.  Neck: Normal range of motion. Neck supple. No JVD present. No tracheal deviation present. No thyromegaly present.  Cardiovascular: Normal rate, regular rhythm, normal heart sounds and intact distal pulses.  Exam reveals no gallop and no friction rub.   No murmur heard. Pulmonary/Chest: Effort normal and breath sounds normal. No respiratory distress. She has no wheezes. She has no rales.  Abdominal: Soft. Bowel sounds are normal. She  exhibits no distension and no mass. There is no tenderness.  Musculoskeletal: Normal range of motion. She exhibits no edema or tenderness.  Lymphadenopathy:    She has no cervical adenopathy.  Neurological: She is alert and oriented to person, place, and time. No cranial nerve deficit.  02/17/14 MMSE 29/30 06/13/14 MMSE 30/30. Passed clock drawing.  Skin: No rash noted. She is not diaphoretic. No erythema. No pallor.  3 cm long 2nd degree burn to left cheek from curling iron  Psychiatric: She has a normal mood and affect. Her behavior is normal. Thought content normal.     Labs reviewed: Abstract on 12/13/2014  Component Date Value Ref Range Status  . Glucose 12/12/2014 151   Final  . BUN 12/12/2014 14  4 - 21 mg/dL Final  . Creatinine 12/12/2014 0.8  0.5 - 1.1 mg/dL Final  . Potassium 12/12/2014 4.0  3.4 - 5.3 mmol/L Final  . Sodium 12/12/2014 139  137 - 147 mmol/L Final  . Triglycerides 12/12/2014 105  40 - 160 mg/dL Final  . Cholesterol 12/12/2014 152  0 - 200 mg/dL Final  . HDL 12/12/2014 56  35 - 70 mg/dL Final  . LDL Cholesterol 12/12/2014 75   Final  . Alkaline Phosphatase 12/12/2014 61  25 - 125 U/L Final  . ALT 12/12/2014 9  7 - 35 U/L Final  . AST 12/12/2014 16  13 - 35 U/L Final  . Bilirubin, Total 12/12/2014 0.5   Final     Assessment/Plan 1. Cough Improving  2. DM type 2 with diabetic peripheral neuropathy Observe, will reevaluate in 6 months  3. Midline low back pain, with sciatica presence unspecified Stable, encouraged to get back into water aerobics  4. Essential hypertension Controlled  5. Memory loss Stable  6. Hyperlipidemia Well controlled

## 2014-12-26 ENCOUNTER — Encounter: Payer: Self-pay | Admitting: Internal Medicine

## 2015-01-01 NOTE — Addendum Note (Signed)
Addended by: Estill Dooms on: 01/01/2015 02:39 PM   Modules accepted: Level of Service

## 2015-01-09 ENCOUNTER — Encounter: Payer: Self-pay | Admitting: Internal Medicine

## 2015-01-11 ENCOUNTER — Encounter: Payer: Self-pay | Admitting: Nurse Practitioner

## 2015-01-11 ENCOUNTER — Non-Acute Institutional Stay: Payer: Medicare Other | Admitting: Nurse Practitioner

## 2015-01-11 VITALS — BP 142/78 | HR 80 | Temp 97.7°F | Wt 113.0 lb

## 2015-01-11 DIAGNOSIS — R1013 Epigastric pain: Secondary | ICD-10-CM

## 2015-01-11 DIAGNOSIS — G629 Polyneuropathy, unspecified: Secondary | ICD-10-CM | POA: Diagnosis not present

## 2015-01-11 DIAGNOSIS — E213 Hyperparathyroidism, unspecified: Secondary | ICD-10-CM | POA: Diagnosis not present

## 2015-01-11 DIAGNOSIS — K219 Gastro-esophageal reflux disease without esophagitis: Secondary | ICD-10-CM | POA: Diagnosis not present

## 2015-01-11 DIAGNOSIS — E1142 Type 2 diabetes mellitus with diabetic polyneuropathy: Secondary | ICD-10-CM | POA: Diagnosis not present

## 2015-01-11 DIAGNOSIS — I10 Essential (primary) hypertension: Secondary | ICD-10-CM | POA: Diagnosis not present

## 2015-01-11 MED ORDER — OMEPRAZOLE 20 MG PO CPDR: DELAYED_RELEASE_CAPSULE | ORAL | Status: DC

## 2015-01-16 ENCOUNTER — Ambulatory Visit
Admission: RE | Admit: 2015-01-16 | Discharge: 2015-01-16 | Disposition: A | Discharge status: Home / Self Care | Payer: Medicare Other | Source: Ambulatory Visit | Attending: Nurse Practitioner | Admitting: Nurse Practitioner

## 2015-01-16 DIAGNOSIS — R1013 Epigastric pain: Secondary | ICD-10-CM

## 2015-01-16 DIAGNOSIS — G629 Polyneuropathy, unspecified: Secondary | ICD-10-CM | POA: Diagnosis not present

## 2015-01-16 DIAGNOSIS — M5136 Other intervertebral disc degeneration, lumbar region: Secondary | ICD-10-CM | POA: Diagnosis not present

## 2015-01-16 DIAGNOSIS — M47817 Spondylosis without myelopathy or radiculopathy, lumbosacral region: Secondary | ICD-10-CM | POA: Diagnosis not present

## 2015-01-16 DIAGNOSIS — M5134 Other intervertebral disc degeneration, thoracic region: Secondary | ICD-10-CM | POA: Diagnosis not present

## 2015-01-16 DIAGNOSIS — I5021 Acute systolic (congestive) heart failure: Secondary | ICD-10-CM | POA: Diagnosis not present

## 2015-01-16 DIAGNOSIS — M4185 Other forms of scoliosis, thoracolumbar region: Secondary | ICD-10-CM | POA: Diagnosis not present

## 2015-01-16 DIAGNOSIS — K589 Irritable bowel syndrome without diarrhea: Secondary | ICD-10-CM | POA: Diagnosis not present

## 2015-01-16 DIAGNOSIS — R634 Abnormal weight loss: Secondary | ICD-10-CM | POA: Diagnosis not present

## 2015-01-16 DIAGNOSIS — E1021 Type 1 diabetes mellitus with diabetic nephropathy: Secondary | ICD-10-CM | POA: Diagnosis not present

## 2015-01-16 DIAGNOSIS — I5032 Chronic diastolic (congestive) heart failure: Secondary | ICD-10-CM | POA: Diagnosis not present

## 2015-01-16 DIAGNOSIS — E039 Hypothyroidism, unspecified: Secondary | ICD-10-CM | POA: Diagnosis not present

## 2015-01-16 LAB — BASIC METABOLIC PANEL
BUN: 15 mg/dL (ref 4–21)
Creatinine: 0.8 mg/dL (ref 0.5–1.1)
Glucose: 123 mg/dL
Potassium: 4.2 mmol/L (ref 3.4–5.3)
Sodium: 140 mmol/L (ref 137–147)

## 2015-01-16 LAB — HEPATIC FUNCTION PANEL
ALT: 13 U/L (ref 7–35)
AST: 19 U/L (ref 13–35)
Alkaline Phosphatase: 62 U/L (ref 25–125)
Bilirubin, Total: 0.7 mg/dL

## 2015-01-16 LAB — CBC AND DIFFERENTIAL
HCT: 40 % (ref 36–46)
Hemoglobin: 13.5 g/dL (ref 12.0–16.0)
Platelets: 264 10*3/uL (ref 150–399)
WBC: 7.9 10^3/mL

## 2015-01-16 LAB — HEMOGLOBIN A1C: Hgb A1c MFr Bld: 6.8 % — AB (ref 4.0–6.0)

## 2015-01-16 LAB — TSH: TSH: 0.74 u[IU]/mL (ref 0.41–5.90)

## 2015-01-17 ENCOUNTER — Encounter: Payer: Self-pay | Admitting: *Deleted

## 2015-01-19 NOTE — Assessment & Plan Note (Addendum)
Continue diet control blood sugar and fasting CBG qod, update Hgb a1c

## 2015-01-19 NOTE — Assessment & Plan Note (Signed)
C/o epigastric pain/discomfort, will add Omeprazole 20mg  daily, observe the patient, workups for differential dx

## 2015-01-19 NOTE — Assessment & Plan Note (Signed)
Controlled, continue Losartan 50mg daily 

## 2015-01-19 NOTE — Progress Notes (Signed)
Patient ID: [name], female   DOB: 06-20-1926, 81 y.o.   MRN: [id number]  Location:  clinic [place] Provider:  Marlana Latus NP  Code Status:  DNR Goals of care: Advanced Directive information    Chief Complaint  Patient presents with  . GI Problem    stomach hurts all the time for past 3 weeks, all the time in upper abdominal, even if she eats or doesn't. Some constipation, no N/V, Taking store brand Tums. Blood sugar has been up since this stomach problem. from 12/29/14 to 01/11/15  in range 96 to 126.      HPI: Patient is a 81 y.o. female seen in the clinic at [facility] today for evaluation of pain in her stomach about 3 weeks, pain is constant, not positional,  but doesn't interfere sleep at night, food ingestion doesn't relieve or worsen the pain, denied nausea, vomiting, constipation, diarrhea, or blood in stool.  Her blood pressure has been controlled with daily Losartan 50mg . Her fasting blood sugar has been under 126 even if the patient reported her blood sugar has been trending up from 90s to 120s, home blood sugar monitoring qod presently  Problem List Items Addressed This Visit    DM type 2 with diabetic peripheral neuropathy (Chronic)    Continue diet control blood sugar and fasting CBG qod, update Hgb a1c      Hyperparathyroidism (Chronic)    Followed by Dr. Chalmers Cater. Had surgery by Dr. Harlow Asa in 2007.      Essential hypertension    Controlled, continue Losartan 50mg  daily      GERD (gastroesophageal reflux disease)    C/o epigastric pain/discomfort, will add Omeprazole 20mg  daily, observe the patient, workups for differential dx      Relevant Medications   omeprazole (PRILOSEC) 20 MG capsule    Other Visit Diagnoses    Epigastric pain    -  Primary    Relevant Orders    DG Lumbar Spine Complete (Completed)    DG Thoracic Spine W/Swimmers (Completed)       Review of Systems:  Review of Systems  Constitutional: Negative for fever, weight loss and  malaise/fatigue.  HENT: Positive for hearing loss.   Eyes: Negative for blurred vision and double vision.  Respiratory: Negative for cough and shortness of breath.   Cardiovascular: Negative for chest pain, palpitations, orthopnea, leg swelling and PND.  Gastrointestinal: Positive for abdominal pain. Negative for heartburn, nausea, vomiting, diarrhea, constipation and blood in stool.  Genitourinary: Negative for dysuria, urgency, frequency and hematuria.  Musculoskeletal: Positive for myalgias and back pain.  Skin: Negative.   Neurological: Negative for dizziness, tremors and weakness.       Decreased vibratory sensation in both feet.  Psychiatric/Behavioral: Negative for depression and hallucinations. The patient is not nervous/anxious.     Past Medical History  Diagnosis Date  . Type II or unspecified type diabetes mellitus without mention of complication, uncontrolled   . Keratoconjunctivitis sicca, not specified as Sjogren's   . Loss of weight   . Unspecified vitamin D deficiency   . Contact dermatitis and other eczema, due to unspecified cause   . Cortical senile cataract   . Irritable bowel syndrome   . Other and unspecified hyperlipidemia   . Unspecified hereditary and idiopathic peripheral neuropathy   . Macular degeneration (senile) of retina, unspecified   . Unspecified essential hypertension   . Reflux esophagitis   . Pain in joint, site unspecified   . Pathologic fracture  of vertebrae   . Scoliosis (and kyphoscoliosis), idiopathic   . Memory loss   . Senile osteoporosis   . GERD (gastroesophageal reflux disease) 02/17/2013  . Other malaise and fatigue 06/17/2010  . Hyperparathyroidism, unspecified 03/26/2010    Patient Active Problem List   Diagnosis Date Noted  . Pain in left knee 06/12/2014  . Scoliosis 08/04/2013  . GERD (gastroesophageal reflux disease) 02/17/2013  . Irritable bowel syndrome 02/17/2013  . Lumbago 02/17/2013  . Hyperparathyroidism 02/17/2013    . Unspecified vitamin D deficiency 02/17/2013  . Hyperlipidemia   . Diabetic neuropathy   . Essential hypertension   . Memory loss   . DM type 2 with diabetic peripheral neuropathy 08/27/2012    Allergies  Allergen Reactions  . Bactrim     unknown  . Betadine [Povidone Iodine]     unknown  . Metformin And Related     Hurt stomach  . Nutmeg Oil (Myristica Oil)     unknown  . Sulfa Antibiotics     unknown    Medications: Patient's Medications  New Prescriptions   OMEPRAZOLE (PRILOSEC) 20 MG CAPSULE    Take one capsule by mouth once a day for stomach  Previous Medications   GLUCOSE BLOOD TEST STRIP    by Other route. Truetest test strip, check blood sugar once every other day   LOSARTAN (COZAAR) 50 MG TABLET    TAKE 1 TABLET ONCE DAILY TO CONTROL BLOOD PRESSURE.   MULTIPLE VITAMINS-MINERALS (ICAPS MV PO)    Take 2 tablets by mouth daily.   POLYVINYL ALCOHOL-POVIDONE (REFRESH) 1.4-0.6 % OPHTHALMIC SOLUTION    1-2 drops as needed.   TRUEPLUS LANCETS 28G MISC    by Does not apply route. Check blood sugar once every other day  Modified Medications   No medications on file  Discontinued Medications   TRUEPLUS LANCETS 28G MISC    CHECK BLOOD SUGAR TWICE DAILY.   TRUETEST TEST TEST STRIP    CHECK BLOOD SUGAR TWICE DAILY.    Physical Exam: Filed Vitals:   01/11/15 1508  BP: 142/78  Pulse: 80  Temp: 97.7 F (36.5 C)  TempSrc: Oral  Weight: 113 lb (51.256 kg)   Body mass index is 21.36 kg/(m^2).  Physical Exam  Constitutional: She is oriented to person, place, and time. She appears well-developed and well-nourished. No distress.  HENT:  Head: Normocephalic and atraumatic.  Right Ear: External ear normal.  Left Ear: External ear normal.  Nose: Nose normal.  Mouth/Throat: Oropharynx is clear and moist.  Eyes: Conjunctivae and EOM are normal. Pupils are equal, round, and reactive to light.  Corrective lenses. Cataracts.  Neck: Normal range of motion. Neck supple. No JVD  present. No tracheal deviation present. No thyromegaly present.  Cardiovascular: Normal rate, regular rhythm, normal heart sounds and intact distal pulses.  Exam reveals no gallop and no friction rub.   No murmur heard. Pulmonary/Chest: Effort normal and breath sounds normal. No respiratory distress. She has no wheezes. She has no rales.  Abdominal: Soft. Bowel sounds are normal. She exhibits no distension and no mass. There is no tenderness.  Musculoskeletal: Normal range of motion. She exhibits no edema or tenderness.  Lymphadenopathy:    She has no cervical adenopathy.  Neurological: She is alert and oriented to person, place, and time. No cranial nerve deficit.  02/17/14 MMSE 29/30 06/13/14 MMSE 30/30. Passed clock drawing.  Skin: No rash noted. She is not diaphoretic. No erythema. No pallor.  Psychiatric: She has a  normal mood and affect. Her behavior is normal. Thought content normal.    Labs reviewed: Basic Metabolic Panel:  Recent Labs  05/30/14 12/12/14 01/16/15  NA 139 139 140  K 4.6 4.0 4.2  BUN 19 14 15   CREATININE 0.7 0.8 0.8    Liver Function Tests:  Recent Labs  12/12/14 01/16/15  AST 16 19  ALT 9 13  ALKPHOS 61 62    CBC:  Recent Labs  01/16/15  WBC 7.9  HGB 13.5  HCT 40  PLT 264    Lab Results  Component Value Date   TSH 0.74 01/16/2015   Lab Results  Component Value Date   HGBA1C 6.8* 01/16/2015   Lab Results  Component Value Date   CHOL 152 12/12/2014   HDL 56 12/12/2014   LDLCALC 75 12/12/2014   TRIG 105 12/12/2014    Significant Diagnostic Results since last visit: none  Patient Care Team: Estill Dooms, MD as PCP - General (Internal Medicine) [facility] Ozie Lupe X, NP as Nurse Practitioner (Nurse Practitioner) Jacelyn Pi, MD as Consulting Physician (Endocrinology) Calvert Cantor, MD as Consulting Physician (Ophthalmology) Vickey Huger, MD as Consulting Physician (Orthopedic Surgery) Suella Broad, MD as Consulting  Physician (Physical Medicine and Rehabilitation) Erline Levine, MD as Consulting Physician (Neurosurgery) Juanita Craver, MD as Consulting Physician (Gastroenterology) Peter M Martinique, MD as Consulting Physician (Cardiology) Megan Salon, MD as Consulting Physician (Gynecology)  Assessment/Plan 1. Epigastric pain - differential diagnosis trial of Omeprazole 20mg  daily for ? GERD - DG Lumbar Spine Complete; Future - DG Thoracic Spine W/Swimmers; Future - may consider [place] abd or CT abd - CBC, CMP, TSH, Hgb A1c, Amylase, Lipase, UA C/S  Family/ staff Communication: observe for improvement of epigastric pain.   Labs/tests ordered: X-ray T and L spine, CBC, CMP, TSH, HgbA1c, amylase, lipase, UA C/S, may consider [place] abd or CT abd if no better  [facility] Zayn Selley NP [facility] [address], [place] [address] On Call:  [phone] & follow prompts after 5pm & weekends Office Phone:  [phone] Office Fax:  [phone]

## 2015-01-19 NOTE — Assessment & Plan Note (Addendum)
Followed by Dr. Chalmers Cater. Had surgery by Dr. Harlow Asa in 2007.

## 2015-01-22 ENCOUNTER — Telehealth: Payer: Self-pay

## 2015-01-22 NOTE — Telephone Encounter (Signed)
There are no new lumbar fractures. There is progression in degeneration of the lumbar disks at multiple levels. Send copy of the report as requested.

## 2015-01-22 NOTE — Telephone Encounter (Signed)
Nurse([place]) from [facility] called on behalf of the patient requesting copy of lumbar spine results from last week. [name] would like a copy of results faxed. I informed [name] that we will fax once report is addressed.  Please advise

## 2015-01-22 NOTE — Telephone Encounter (Signed)
Called patient with urine report from 01/16/15 "not adequate for UTI, unless has catheter " from Dr. Mariea Clonts. Told patient about x-ray results. Keep 02/22/15 appt with [facility], at that time she will talk with patient to see if she needs CT, as [facility] had discussed with her.  Patient was fine with this.

## 2015-01-27 ENCOUNTER — Encounter: Payer: Self-pay | Admitting: Nurse Practitioner

## 2015-01-27 DIAGNOSIS — M5440 Lumbago with sciatica, unspecified side: Secondary | ICD-10-CM

## 2015-01-27 NOTE — Assessment & Plan Note (Signed)
01/16/15 X-ray thoracic and lumbar spine: no acute findings.

## 2015-01-27 NOTE — Progress Notes (Signed)
Patient ID: MIESHA BACHMANN, female   DOB: January 21, 1926, 81 y.o.   MRN: [id number]  Location:  clinic [place] Provider:  Marlana Latus NP  Code Status:  DNR Goals of care: Advanced Directive information    No chief complaint on file.    HPI: Patient is a 81 y.o. female seen in the clinic at [facility] today for evaluation of   and other chronic medical conditions.   Review of Systems:  ROS  Past Medical History  Diagnosis Date  . Type II or unspecified type diabetes mellitus without mention of complication, uncontrolled   . Keratoconjunctivitis sicca, not specified as Sjogren's   . Loss of weight   . Unspecified vitamin D deficiency   . Contact dermatitis and other eczema, due to unspecified cause   . Cortical senile cataract   . Irritable bowel syndrome   . Other and unspecified hyperlipidemia   . Unspecified hereditary and idiopathic peripheral neuropathy   . Macular degeneration (senile) of retina, unspecified   . Unspecified essential hypertension   . Reflux esophagitis   . Pain in joint, site unspecified   . Pathologic fracture of vertebrae   . Scoliosis (and kyphoscoliosis), idiopathic   . Memory loss   . Senile osteoporosis   . GERD (gastroesophageal reflux disease) 02/17/2013  . Other malaise and fatigue 06/17/2010  . Hyperparathyroidism, unspecified 03/26/2010    Patient Active Problem List   Diagnosis Date Noted  . Pain in left knee 06/12/2014  . Scoliosis 08/04/2013  . GERD (gastroesophageal reflux disease) 02/17/2013  . Irritable bowel syndrome 02/17/2013  . Lumbago 02/17/2013  . Hyperparathyroidism 02/17/2013  . Unspecified vitamin D deficiency 02/17/2013  . Hyperlipidemia   . Diabetic neuropathy   . Essential hypertension   . Memory loss   . DM type 2 with diabetic peripheral neuropathy 08/27/2012    Allergies  Allergen Reactions  . Bactrim     unknown  . Betadine [Povidone Iodine]     unknown  . Metformin And Related     Hurt stomach  .  Nutmeg Oil (Myristica Oil)     unknown  . Sulfa Antibiotics     unknown    Medications: Patient's Medications  New Prescriptions   No medications on file  Previous Medications   GLUCOSE BLOOD TEST STRIP    by Other route. Truetest test strip, check blood sugar once every other day   LOSARTAN (COZAAR) 50 MG TABLET    TAKE 1 TABLET ONCE DAILY TO CONTROL BLOOD PRESSURE.   MULTIPLE VITAMINS-MINERALS (ICAPS MV PO)    Take 2 tablets by mouth daily.   OMEPRAZOLE (PRILOSEC) 20 MG CAPSULE    Take one capsule by mouth once a day for stomach   POLYVINYL ALCOHOL-POVIDONE (REFRESH) 1.4-0.6 % OPHTHALMIC SOLUTION    1-2 drops as needed.   TRUEPLUS LANCETS 28G MISC    by Does not apply route. Check blood sugar once every other day  Modified Medications   No medications on file  Discontinued Medications   No medications on file    Physical Exam: There were no vitals filed for this visit. There is no weight on file to calculate BMI.  Physical Exam  Labs reviewed: Basic Metabolic Panel:  Recent Labs  05/30/14 12/12/14 01/16/15  NA 139 139 140  K 4.6 4.0 4.2  BUN 19 14 15   CREATININE 0.7 0.8 0.8    Liver Function Tests:  Recent Labs  12/12/14 01/16/15  AST 16 19  ALT 9  13  ALKPHOS 61 62    CBC:  Recent Labs  01/16/15  WBC 7.9  HGB 13.5  HCT 40  PLT 264    Lab Results  Component Value Date   TSH 0.74 01/16/2015   Lab Results  Component Value Date   HGBA1C 6.8* 01/16/2015   Lab Results  Component Value Date   CHOL 152 12/12/2014   HDL 56 12/12/2014   LDLCALC 75 12/12/2014   TRIG 105 12/12/2014    Significant Diagnostic Results since last visit:   Patient Care Team: Estill Dooms, MD as PCP - General (Internal Medicine) [place] Taiyana Kissler X, NP as Nurse Practitioner (Nurse Practitioner) Jacelyn Pi, MD as Consulting Physician (Endocrinology) Calvert Cantor, MD as Consulting Physician (Ophthalmology) Vickey Huger, MD as Consulting Physician  (Orthopedic Surgery) Suella Broad, MD as Consulting Physician (Physical Medicine and Rehabilitation) Erline Levine, MD as Consulting Physician (Neurosurgery) Juanita Craver, MD as Consulting Physician (Gastroenterology) Peter M Martinique, MD as Consulting Physician (Cardiology) Megan Salon, MD as Consulting Physician (Gynecology)  Assessment/Plan Problem List Items Addressed This Visit    Lumbago - Primary (Chronic)    01/16/15 X-ray thoracic and lumbar spine: no acute findings.           Family/ staff Communication:   Labs/tests ordered:    Marlana Latus NP [facility] [address], [place] [address] On Call:  [phone] & follow prompts after 5pm & weekends Office Phone:  [phone] Office Fax:  [phone]   This encounter was created in error - please disregard.

## 2015-02-22 ENCOUNTER — Encounter: Payer: Self-pay | Admitting: Nurse Practitioner

## 2015-02-22 ENCOUNTER — Non-Acute Institutional Stay: Payer: Medicare Other | Admitting: Nurse Practitioner

## 2015-02-22 DIAGNOSIS — G629 Polyneuropathy, unspecified: Secondary | ICD-10-CM

## 2015-02-22 DIAGNOSIS — K219 Gastro-esophageal reflux disease without esophagitis: Secondary | ICD-10-CM | POA: Diagnosis not present

## 2015-02-22 DIAGNOSIS — M5440 Lumbago with sciatica, unspecified side: Secondary | ICD-10-CM

## 2015-02-22 DIAGNOSIS — I10 Essential (primary) hypertension: Secondary | ICD-10-CM

## 2015-02-22 DIAGNOSIS — E1142 Type 2 diabetes mellitus with diabetic polyneuropathy: Secondary | ICD-10-CM | POA: Diagnosis not present

## 2015-02-22 NOTE — Assessment & Plan Note (Signed)
01/16/15 Hgb A1c 6.8 Continue diet control.

## 2015-02-22 NOTE — Assessment & Plan Note (Signed)
Complaint from epigastric pain to annoying sensation since Omeprazole started, which TUMs after meal relives the symptoms too. The patient desires delay the further workups. Also her chronic back pain may contributory too

## 2015-02-22 NOTE — Assessment & Plan Note (Signed)
Related to scoliosis and arthritis of lumbar area. Has seen neurosurgeon, Dr. Vertell Limber. History of kyphoplasty for vertebral fracture 2010. 01/16/15 X-ray thoracic and lumbar spine: no acute findings. L1-2 L2-3 DDD more conspicuous than in the past  The patient was instructed to take OTC Tylenol over the Aleve  Due to her c/o GI symptoms.

## 2015-02-22 NOTE — Progress Notes (Signed)
Patient ID: [name], female   DOB: Feb 07, 1926, 81 y.o.   MRN: [id number]  Location:  clinic [place] Provider:  Marlana Latus NP  Code Status:  DNR Goals of care: Advanced Directive information Does patient have an advance directive?: Yes, Type of Advance Directive: [place];Living will  Chief Complaint  Patient presents with  . Medical Management of Chronic Issues    6 week  follow-up epigastric pain     HPI: Patient is a 81 y.o. female seen in the clinic at [facility] today for evaluation of "epigatric annoying feelings", had c/o epigastric pain, CBC, CMP, Amylase, Lipase, TSH, Hgb A1c 01/16/15 unremarkable, X-ray L spine 01/16/15 revealed L1-2 and L2-3 more conspicuous than in the past. The patient was placed on Omeprazole when she came in with c/o of epigastric pain and now she says it annoying sensation in the area, especially Tums after meals relieves the symptoms, she desires delaying further workups since her symptoms does not interfere her daily routine. Hx of back pain which she takes Aleve in the past, I do think Tylenol may have less irritation on her GI complaint.   Review of Systems:  Review of Systems  Constitutional: Negative for fever, weight loss and malaise/fatigue.  HENT: Positive for hearing loss.   Eyes: Negative for blurred vision and double vision.  Respiratory: Negative for cough and shortness of breath.   Cardiovascular: Negative for chest pain, palpitations, orthopnea, leg swelling and PND.  Gastrointestinal: Negative for heartburn, nausea, vomiting, abdominal pain, diarrhea, constipation and blood in stool.       Epigastric annoying sensation  Genitourinary: Negative for dysuria, urgency, frequency and hematuria.  Musculoskeletal: Positive for myalgias and back pain.  Skin: Negative.   Neurological: Negative for dizziness, tremors and weakness.       Decreased vibratory sensation in both feet.  Psychiatric/Behavioral: Negative for  depression and hallucinations. The patient is not nervous/anxious.     Past Medical History  Diagnosis Date  . Type II or unspecified type diabetes mellitus without mention of complication, uncontrolled   . Keratoconjunctivitis sicca, not specified as Sjogren's   . Loss of weight   . Unspecified vitamin D deficiency   . Contact dermatitis and other eczema, due to unspecified cause   . Cortical senile cataract   . Irritable bowel syndrome   . Other and unspecified hyperlipidemia   . Unspecified hereditary and idiopathic peripheral neuropathy   . Macular degeneration (senile) of retina, unspecified   . Unspecified essential hypertension   . Reflux esophagitis   . Pain in joint, site unspecified   . Pathologic fracture of vertebrae   . Scoliosis (and kyphoscoliosis), idiopathic   . Memory loss   . Senile osteoporosis   . GERD (gastroesophageal reflux disease) 02/17/2013  . Other malaise and fatigue 06/17/2010  . Hyperparathyroidism, unspecified 03/26/2010    Patient Active Problem List   Diagnosis Date Noted  . Pain in left knee 06/12/2014  . Scoliosis 08/04/2013  . GERD (gastroesophageal reflux disease) 02/17/2013  . Irritable bowel syndrome 02/17/2013  . Lumbago 02/17/2013  . Hyperparathyroidism 02/17/2013  . Unspecified vitamin D deficiency 02/17/2013  . Hyperlipidemia   . Diabetic neuropathy   . Essential hypertension   . Memory loss   . DM type 2 with diabetic peripheral neuropathy 08/27/2012    Allergies  Allergen Reactions  . Bactrim     unknown  . Betadine [Povidone Iodine]     unknown  . Metformin And Related  Hurt stomach  . Nutmeg Oil (Myristica Oil)     unknown  . Sulfa Antibiotics     unknown    Medications: Patient's Medications  New Prescriptions   No medications on file  Previous Medications   GLUCOSE BLOOD TEST STRIP    by Other route. Truetest test strip, check blood sugar once every other day   LOSARTAN (COZAAR) 50 MG TABLET    TAKE 1  TABLET ONCE DAILY TO CONTROL BLOOD PRESSURE.   MULTIPLE VITAMINS-MINERALS (ICAPS MV PO)    Take 2 tablets by mouth daily.   OMEPRAZOLE (PRILOSEC) 20 MG CAPSULE    Take one capsule by mouth once a day for stomach   POLYVINYL ALCOHOL-POVIDONE (REFRESH) 1.4-0.6 % OPHTHALMIC SOLUTION    1-2 drops as needed.   TRUEPLUS LANCETS 28G MISC    by Does not apply route. Check blood sugar once every other day  Modified Medications   No medications on file  Discontinued Medications   No medications on file    Physical Exam: There were no vitals filed for this visit. There is no weight on file to calculate BMI.  Physical Exam  Constitutional: She is oriented to person, place, and time. She appears well-developed and well-nourished. No distress.  HENT:  Head: Normocephalic and atraumatic.  Right Ear: External ear normal.  Left Ear: External ear normal.  Nose: Nose normal.  Mouth/Throat: Oropharynx is clear and moist.  Eyes: Conjunctivae and EOM are normal. Pupils are equal, round, and reactive to light.  Corrective lenses. Cataracts.  Neck: Normal range of motion. Neck supple. No JVD present. No tracheal deviation present. No thyromegaly present.  Cardiovascular: Normal rate, regular rhythm, normal heart sounds and intact distal pulses.  Exam reveals no gallop and no friction rub.   No murmur heard. Pulmonary/Chest: Effort normal and breath sounds normal. No respiratory distress. She has no wheezes. She has no rales.  Abdominal: Soft. Bowel sounds are normal. She exhibits no distension and no mass. There is no tenderness.  Musculoskeletal: Normal range of motion. She exhibits no edema or tenderness.  Lymphadenopathy:    She has no cervical adenopathy.  Neurological: She is alert and oriented to person, place, and time. No cranial nerve deficit.  02/17/14 MMSE 29/30 06/13/14 MMSE 30/30. Passed clock drawing.  Skin: No rash noted. She is not diaphoretic. No erythema. No pallor.  Psychiatric: She  has a normal mood and affect. Her behavior is normal. Thought content normal.    Labs reviewed: Basic Metabolic Panel:  Recent Labs  05/30/14 12/12/14 01/16/15  NA 139 139 140  K 4.6 4.0 4.2  BUN 19 14 15   CREATININE 0.7 0.8 0.8    Liver Function Tests:  Recent Labs  12/12/14 01/16/15  AST 16 19  ALT 9 13  ALKPHOS 61 62    CBC:  Recent Labs  01/16/15  WBC 7.9  HGB 13.5  HCT 40  PLT 264    Lab Results  Component Value Date   TSH 0.74 01/16/2015   Lab Results  Component Value Date   HGBA1C 6.8* 01/16/2015   Lab Results  Component Value Date   CHOL 152 12/12/2014   HDL 56 12/12/2014   LDLCALC 75 12/12/2014   TRIG 105 12/12/2014    Significant Diagnostic Results since last visit: none  Patient Care Team: Estill Dooms, MD as PCP - General (Internal Medicine) [place] Keya Wynes X, NP as Nurse Practitioner (Nurse Practitioner) Jacelyn Pi, MD as Consulting Physician (  Endocrinology) Calvert Cantor, MD as Consulting Physician (Ophthalmology) Vickey Huger, MD as Consulting Physician (Orthopedic Surgery) Suella Broad, MD as Consulting Physician (Physical Medicine and Rehabilitation) Erline Levine, MD as Consulting Physician (Neurosurgery) Juanita Craver, MD as Consulting Physician (Gastroenterology) Peter M Martinique, MD as Consulting Physician (Cardiology) Megan Salon, MD as Consulting Physician (Gynecology)  Assessment/Plan Problem List Items Addressed This Visit    DM type 2 with diabetic peripheral neuropathy - Primary (Chronic)    01/16/15 Hgb A1c 6.8 Continue diet control.       Lumbago (Chronic)    Related to scoliosis and arthritis of lumbar area. Has seen neurosurgeon, Dr. Vertell Limber. History of kyphoplasty for vertebral fracture 2010. 01/16/15 X-ray thoracic and lumbar spine: no acute findings. L1-2 L2-3 DDD more conspicuous than in the past  The patient was instructed to take OTC Tylenol over the Aleve  Due to her c/o GI symptoms.          Essential hypertension    Controlled, continue Losartan 50mg  daily.       GERD (gastroesophageal reflux disease)    Complaint from epigastric pain to annoying sensation since Omeprazole started, which TUMs after meal relives the symptoms too. The patient desires delay the further workups. Also her chronic back pain may contributory too           Family/ staff Communication: continue to observe for epigastric symptoms.   Labs/tests ordered: CBC, CMP, amylase, Lipase, TSH, Hgb A1c done 01/16/15  [facility] Kimori Tartaglia NP [facility] [address], [place] [address] On Call:  [phone] & follow prompts after 5pm & weekends Office Phone:  [phone] Office Fax:  [phone]

## 2015-02-22 NOTE — Assessment & Plan Note (Signed)
Controlled, continue Losartan 50mg daily 

## 2015-03-08 ENCOUNTER — Other Ambulatory Visit: Payer: Self-pay | Admitting: Internal Medicine

## 2015-03-29 DIAGNOSIS — Z23 Encounter for immunization: Secondary | ICD-10-CM | POA: Diagnosis not present

## 2015-04-03 DIAGNOSIS — H524 Presbyopia: Secondary | ICD-10-CM | POA: Diagnosis not present

## 2015-04-03 DIAGNOSIS — H353132 Nonexudative age-related macular degeneration, bilateral, intermediate dry stage: Secondary | ICD-10-CM | POA: Diagnosis not present

## 2015-04-03 DIAGNOSIS — H26492 Other secondary cataract, left eye: Secondary | ICD-10-CM | POA: Diagnosis not present

## 2015-04-03 DIAGNOSIS — H04123 Dry eye syndrome of bilateral lacrimal glands: Secondary | ICD-10-CM | POA: Diagnosis not present

## 2015-04-03 LAB — HM DIABETES EYE EXAM

## 2015-04-05 ENCOUNTER — Encounter: Payer: Self-pay | Admitting: *Deleted

## 2015-06-12 ENCOUNTER — Encounter: Payer: Self-pay | Admitting: *Deleted

## 2015-06-12 DIAGNOSIS — Z794 Long term (current) use of insulin: Secondary | ICD-10-CM | POA: Diagnosis not present

## 2015-06-12 DIAGNOSIS — I10 Essential (primary) hypertension: Secondary | ICD-10-CM | POA: Diagnosis not present

## 2015-06-12 DIAGNOSIS — E118 Type 2 diabetes mellitus with unspecified complications: Secondary | ICD-10-CM | POA: Diagnosis not present

## 2015-06-12 DIAGNOSIS — G629 Polyneuropathy, unspecified: Secondary | ICD-10-CM | POA: Diagnosis not present

## 2015-06-12 LAB — BASIC METABOLIC PANEL
BUN: 15 mg/dL (ref 4–21)
Creatinine: 0.7 mg/dL (ref 0.5–1.1)
Glucose: 121 mg/dL
Potassium: 4.5 mmol/L (ref 3.4–5.3)
Sodium: 139 mmol/L (ref 137–147)

## 2015-06-12 LAB — HEMOGLOBIN A1C: Hgb A1c MFr Bld: 6.7 % — AB (ref 4.0–6.0)

## 2015-06-21 ENCOUNTER — Encounter: Payer: Self-pay | Admitting: Internal Medicine

## 2015-06-21 ENCOUNTER — Encounter: Payer: Medicare Other | Admitting: Internal Medicine

## 2015-06-22 NOTE — Progress Notes (Signed)
This encounter was created in error - please disregard.

## 2015-07-17 DIAGNOSIS — R4189 Other symptoms and signs involving cognitive functions and awareness: Secondary | ICD-10-CM | POA: Diagnosis not present

## 2015-07-17 DIAGNOSIS — R29818 Other symptoms and signs involving the nervous system: Secondary | ICD-10-CM | POA: Diagnosis not present

## 2015-07-17 DIAGNOSIS — R2681 Unsteadiness on feet: Secondary | ICD-10-CM | POA: Diagnosis not present

## 2015-07-17 DIAGNOSIS — M6281 Muscle weakness (generalized): Secondary | ICD-10-CM | POA: Diagnosis not present

## 2015-07-17 DIAGNOSIS — M545 Low back pain: Secondary | ICD-10-CM | POA: Diagnosis not present

## 2015-07-17 DIAGNOSIS — M5186 Other intervertebral disc disorders, lumbar region: Secondary | ICD-10-CM | POA: Diagnosis not present

## 2015-07-17 DIAGNOSIS — R32 Unspecified urinary incontinence: Secondary | ICD-10-CM | POA: Diagnosis not present

## 2015-07-17 DIAGNOSIS — R29898 Other symptoms and signs involving the musculoskeletal system: Secondary | ICD-10-CM | POA: Diagnosis not present

## 2015-07-19 DIAGNOSIS — M545 Low back pain: Secondary | ICD-10-CM | POA: Diagnosis not present

## 2015-07-19 DIAGNOSIS — M5186 Other intervertebral disc disorders, lumbar region: Secondary | ICD-10-CM | POA: Diagnosis not present

## 2015-07-19 DIAGNOSIS — M6281 Muscle weakness (generalized): Secondary | ICD-10-CM | POA: Diagnosis not present

## 2015-07-19 DIAGNOSIS — R32 Unspecified urinary incontinence: Secondary | ICD-10-CM | POA: Diagnosis not present

## 2015-07-19 DIAGNOSIS — R2681 Unsteadiness on feet: Secondary | ICD-10-CM | POA: Diagnosis not present

## 2015-07-19 DIAGNOSIS — R4189 Other symptoms and signs involving cognitive functions and awareness: Secondary | ICD-10-CM | POA: Diagnosis not present

## 2015-07-20 DIAGNOSIS — M545 Low back pain: Secondary | ICD-10-CM | POA: Diagnosis not present

## 2015-07-20 DIAGNOSIS — M6281 Muscle weakness (generalized): Secondary | ICD-10-CM | POA: Diagnosis not present

## 2015-07-20 DIAGNOSIS — R32 Unspecified urinary incontinence: Secondary | ICD-10-CM | POA: Diagnosis not present

## 2015-07-20 DIAGNOSIS — M5186 Other intervertebral disc disorders, lumbar region: Secondary | ICD-10-CM | POA: Diagnosis not present

## 2015-07-20 DIAGNOSIS — R2681 Unsteadiness on feet: Secondary | ICD-10-CM | POA: Diagnosis not present

## 2015-07-20 DIAGNOSIS — R4189 Other symptoms and signs involving cognitive functions and awareness: Secondary | ICD-10-CM | POA: Diagnosis not present

## 2015-07-23 DIAGNOSIS — M5186 Other intervertebral disc disorders, lumbar region: Secondary | ICD-10-CM | POA: Diagnosis not present

## 2015-07-23 DIAGNOSIS — M6281 Muscle weakness (generalized): Secondary | ICD-10-CM | POA: Diagnosis not present

## 2015-07-23 DIAGNOSIS — R2681 Unsteadiness on feet: Secondary | ICD-10-CM | POA: Diagnosis not present

## 2015-07-23 DIAGNOSIS — M545 Low back pain: Secondary | ICD-10-CM | POA: Diagnosis not present

## 2015-07-23 DIAGNOSIS — R32 Unspecified urinary incontinence: Secondary | ICD-10-CM | POA: Diagnosis not present

## 2015-07-23 DIAGNOSIS — R4189 Other symptoms and signs involving cognitive functions and awareness: Secondary | ICD-10-CM | POA: Diagnosis not present

## 2015-07-26 ENCOUNTER — Encounter: Payer: Self-pay | Admitting: Nurse Practitioner

## 2015-07-26 ENCOUNTER — Non-Acute Institutional Stay: Payer: Medicare Other | Admitting: Nurse Practitioner

## 2015-07-26 VITALS — BP 120/74 | HR 81 | Temp 97.6°F | Ht 61.0 in | Wt 113.0 lb

## 2015-07-26 DIAGNOSIS — E1142 Type 2 diabetes mellitus with diabetic polyneuropathy: Secondary | ICD-10-CM | POA: Diagnosis not present

## 2015-07-26 DIAGNOSIS — M25562 Pain in left knee: Secondary | ICD-10-CM

## 2015-07-26 DIAGNOSIS — R4189 Other symptoms and signs involving cognitive functions and awareness: Secondary | ICD-10-CM | POA: Diagnosis not present

## 2015-07-26 DIAGNOSIS — M5440 Lumbago with sciatica, unspecified side: Secondary | ICD-10-CM | POA: Diagnosis not present

## 2015-07-26 DIAGNOSIS — K219 Gastro-esophageal reflux disease without esophagitis: Secondary | ICD-10-CM | POA: Diagnosis not present

## 2015-07-26 DIAGNOSIS — I10 Essential (primary) hypertension: Secondary | ICD-10-CM | POA: Diagnosis not present

## 2015-07-26 DIAGNOSIS — M545 Low back pain: Secondary | ICD-10-CM | POA: Diagnosis not present

## 2015-07-26 DIAGNOSIS — M6281 Muscle weakness (generalized): Secondary | ICD-10-CM | POA: Diagnosis not present

## 2015-07-26 DIAGNOSIS — M5186 Other intervertebral disc disorders, lumbar region: Secondary | ICD-10-CM | POA: Diagnosis not present

## 2015-07-26 DIAGNOSIS — R2681 Unsteadiness on feet: Secondary | ICD-10-CM | POA: Diagnosis not present

## 2015-07-26 DIAGNOSIS — R32 Unspecified urinary incontinence: Secondary | ICD-10-CM | POA: Diagnosis not present

## 2015-07-26 NOTE — Progress Notes (Signed)
Patient ID: [name], female   DOB: [birth year], 82 y.o.   MRN: [id number]  Location:  clinic [place] Provider:  Marlana Latus NP  Code Status:  DNR Goals of care: Advanced Directive information Does patient have an advance directive?: Yes, Type of Advance Directive: [place];Living will  Chief Complaint  Patient presents with  . Medical Management of Chronic Issues    stomach problems     HPI: Patient is a 82 y.o. female seen in the clinic at [facility] today for evaluation of  had c/o epigastric pain, CBC, CMP, Amylase, Lipase, TSH, Hgb A1c 01/16/15 unremarkable, X-ray L spine 01/16/15 revealed L1-2 and L2-3 more conspicuous than in the past. The patient was placed on Omeprazole when she came in with c/o of epigastric pain, she stated it helps when she takes, otherwise sitting up straight may relieve her pain but she cannot sit up straight for too long.   Review of Systems  Constitutional: Negative for fever, weight loss and malaise/fatigue.  HENT: Positive for hearing loss.   Eyes: Negative for blurred vision and double vision.  Respiratory: Negative for cough and shortness of breath.   Cardiovascular: Negative for chest pain, palpitations, orthopnea, leg swelling and PND.  Gastrointestinal: Negative for heartburn, nausea, vomiting, abdominal pain, diarrhea, constipation and blood in stool.       Epigastric annoying sensation  Genitourinary: Negative for dysuria, urgency, frequency and hematuria.  Musculoskeletal: Positive for myalgias and back pain.  Skin: Negative.   Neurological: Negative for dizziness, tremors and weakness.       Decreased vibratory sensation in both feet.  Psychiatric/Behavioral: Negative for depression and hallucinations. The patient is not nervous/anxious.     Past Medical History  Diagnosis Date  . Type II or unspecified type diabetes mellitus without mention of complication, uncontrolled   . Keratoconjunctivitis sicca, not  specified as Sjogren's   . Loss of weight   . Unspecified vitamin D deficiency   . Contact dermatitis and other eczema, due to unspecified cause   . Cortical senile cataract   . Irritable bowel syndrome   . Other and unspecified hyperlipidemia   . Unspecified hereditary and idiopathic peripheral neuropathy   . Macular degeneration (senile) of retina, unspecified   . Unspecified essential hypertension   . Reflux esophagitis   . Pain in joint, site unspecified   . Pathologic fracture of vertebrae ([place])   . Scoliosis (and kyphoscoliosis), idiopathic   . Memory loss   . Senile osteoporosis   . GERD (gastroesophageal reflux disease) 02/17/2013  . Other malaise and fatigue 06/17/2010  . Hyperparathyroidism, unspecified ([place]) 03/26/2010    Patient Active Problem List   Diagnosis Date Noted  . Pain in left knee 06/12/2014  . Scoliosis 08/04/2013  . GERD (gastroesophageal reflux disease) 02/17/2013  . Irritable bowel syndrome 02/17/2013  . Lumbago 02/17/2013  . Hyperparathyroidism ([place]) 02/17/2013  . Unspecified vitamin D deficiency 02/17/2013  . Hyperlipidemia   . Diabetic neuropathy ([place])   . Essential hypertension   . Memory loss   . DM type 2 with diabetic peripheral neuropathy ([place]) 08/27/2012    Allergies  Allergen Reactions  . Bactrim     unknown  . Betadine [Povidone Iodine]     unknown  . Metformin And Related     Hurt stomach  . Nutmeg Oil (Myristica Oil)     unknown  . Sulfa Antibiotics     unknown    Medications: Patient's Medications  New Prescriptions  No medications on file  Previous Medications   GLUCOSE BLOOD TEST STRIP    by Other route. Truetest test strip, check blood sugar once every other day   LOSARTAN (COZAAR) 50 MG TABLET    TAKE 1 TABLET ONCE DAILY TO CONTROL BLOOD PRESSURE.   OMEPRAZOLE (PRILOSEC) 20 MG CAPSULE    Take one capsule by mouth once a day for stomach   POLYVINYL ALCOHOL-POVIDONE (REFRESH) 1.4-0.6 % OPHTHALMIC SOLUTION    1-2  drops as needed.   TRUEPLUS LANCETS 28G MISC    by Does not apply route. Check blood sugar once every other day  Modified Medications   No medications on file  Discontinued Medications   MULTIPLE VITAMINS-MINERALS (ICAPS MV PO)    Take 2 tablets by mouth daily. Reported on 07/26/2015    Physical Exam: Filed Vitals:   07/26/15 1510  BP: 120/74  Pulse: 81  Temp: 97.6 F (36.4 C)  TempSrc: Oral  Height: 5\' 1"  (1.549 m)  Weight: 113 lb (51.256 kg)  SpO2: 99%   Body mass index is 21.36 kg/(m^2).  Physical Exam  Constitutional: She is oriented to person, place, and time. She appears well-developed and well-nourished. No distress.  HENT:  Head: Normocephalic and atraumatic.  Right Ear: External ear normal.  Left Ear: External ear normal.  Nose: Nose normal.  Mouth/Throat: Oropharynx is clear and moist.  Eyes: Conjunctivae and EOM are normal. Pupils are equal, round, and reactive to light.  Corrective lenses. Cataracts.  Neck: Normal range of motion. Neck supple. No JVD present. No tracheal deviation present. No thyromegaly present.  Cardiovascular: Normal rate, regular rhythm, normal heart sounds and intact distal pulses.  Exam reveals no gallop and no friction rub.   No murmur heard. Pulmonary/Chest: Effort normal and breath sounds normal. No respiratory distress. She has no wheezes. She has no rales.  Abdominal: Soft. Bowel sounds are normal. She exhibits no distension and no mass. There is no tenderness.  Musculoskeletal: Normal range of motion. She exhibits no edema or tenderness.  Lymphadenopathy:    She has no cervical adenopathy.  Neurological: She is alert and oriented to person, place, and time. No cranial nerve deficit.  02/17/14 MMSE 29/30 06/13/14 MMSE 30/30. Passed clock drawing.  Skin: No rash noted. She is not diaphoretic. No erythema. No pallor.  Psychiatric: She has a normal mood and affect. Her behavior is normal. Thought content normal.    Labs  reviewed: Basic Metabolic Panel:  Recent Labs  12/12/14 01/16/15 06/12/15  NA 139 140 139  K 4.0 4.2 4.5  BUN 14 15 15   CREATININE 0.8 0.8 0.7    Liver Function Tests:  Recent Labs  12/12/14 01/16/15  AST 16 19  ALT 9 13  ALKPHOS 61 62    CBC:  Recent Labs  01/16/15  WBC 7.9  HGB 13.5  HCT 40  PLT 264    Lab Results  Component Value Date   TSH 0.74 01/16/2015   Lab Results  Component Value Date   HGBA1C 6.7* 06/12/2015   Lab Results  Component Value Date   CHOL 152 12/12/2014   HDL 56 12/12/2014   LDLCALC 75 12/12/2014   TRIG 105 12/12/2014    Significant Diagnostic Results since last visit: none  Patient Care Team: Estill Dooms, MD as PCP - General (Internal Medicine) [place] Aubreanna Percle X, NP as Nurse Practitioner (Nurse Practitioner) Jacelyn Pi, MD as Consulting Physician (Endocrinology) Calvert Cantor, MD as Consulting Physician (Ophthalmology) Richardson Landry  Ronnie Derby, MD as Consulting Physician (Orthopedic Surgery) Suella Broad, MD as Consulting Physician (Physical Medicine and Rehabilitation) Erline Levine, MD as Consulting Physician (Neurosurgery) Juanita Craver, MD as Consulting Physician (Gastroenterology) Peter M Martinique, MD as Consulting Physician (Cardiology) Megan Salon, MD as Consulting Physician (Gynecology)  Assessment/Plan Problem List Items Addressed This Visit    Pain in left knee    Underwent Ortho, ambulates with cane.       Lumbago - Primary (Chronic)    X-ray thoracic and lumber spine       Relevant Orders   DG Lumbar Spine Complete   DG Thoracic Spine W/Swimmers   GERD (gastroesophageal reflux disease)    Complaint from epigastric pain, constant, aches, sitting up straight helps, also Omeprazole helps too if she takes. Will obtain X-ray thoracic spine, lumbar spine to evaluate possible.  Also her chronic back pain may contributory too. Obtain H-pylori to r/o peptic ulcer, may GI referral.       Essential  hypertension    Controlled, continue Losartan 50mg  daily.       DM type 2 with diabetic peripheral neuropathy (HCC) (Chronic)    01/16/15 Hgb A1c 6.8 Continue diet control.           Family/ staff Communication: continue to observe for epigastric symptoms.   Labs/tests ordered: H-pylori, X-ray thoracic spine, lumber spine.   [facility] Oviya Ammar NP Geriatrics [facility] [phone], [place] [address] On Call:  [phone] & follow prompts after 5pm & weekends Office Phone:  [phone] Office Fax:  [phone]

## 2015-07-26 NOTE — Assessment & Plan Note (Signed)
X-ray thoracic and lumber spine

## 2015-07-26 NOTE — Assessment & Plan Note (Signed)
Controlled, continue Losartan 50mg daily 

## 2015-07-26 NOTE — Assessment & Plan Note (Signed)
Underwent Ortho, ambulates with cane.

## 2015-07-26 NOTE — Assessment & Plan Note (Signed)
01/16/15 Hgb A1c 6.8 Continue diet control.  

## 2015-07-26 NOTE — Assessment & Plan Note (Signed)
Complaint from epigastric pain, constant, aches, sitting up straight helps, also Omeprazole helps too if she takes. Will obtain X-ray thoracic spine, lumbar spine to evaluate possible.  Also her chronic back pain may contributory too. Obtain H-pylori to r/o peptic ulcer, may GI referral.

## 2015-07-30 DIAGNOSIS — M5186 Other intervertebral disc disorders, lumbar region: Secondary | ICD-10-CM | POA: Diagnosis not present

## 2015-07-30 DIAGNOSIS — R32 Unspecified urinary incontinence: Secondary | ICD-10-CM | POA: Diagnosis not present

## 2015-07-30 DIAGNOSIS — M6281 Muscle weakness (generalized): Secondary | ICD-10-CM | POA: Diagnosis not present

## 2015-07-30 DIAGNOSIS — M545 Low back pain: Secondary | ICD-10-CM | POA: Diagnosis not present

## 2015-07-30 DIAGNOSIS — R2681 Unsteadiness on feet: Secondary | ICD-10-CM | POA: Diagnosis not present

## 2015-07-30 DIAGNOSIS — R4189 Other symptoms and signs involving cognitive functions and awareness: Secondary | ICD-10-CM | POA: Diagnosis not present

## 2015-07-31 DIAGNOSIS — K219 Gastro-esophageal reflux disease without esophagitis: Secondary | ICD-10-CM | POA: Diagnosis not present

## 2015-08-01 ENCOUNTER — Other Ambulatory Visit: Payer: Self-pay | Admitting: Nurse Practitioner

## 2015-08-02 DIAGNOSIS — M545 Low back pain: Secondary | ICD-10-CM | POA: Diagnosis not present

## 2015-08-02 DIAGNOSIS — M5186 Other intervertebral disc disorders, lumbar region: Secondary | ICD-10-CM | POA: Diagnosis not present

## 2015-08-02 DIAGNOSIS — R32 Unspecified urinary incontinence: Secondary | ICD-10-CM | POA: Diagnosis not present

## 2015-08-02 DIAGNOSIS — R2681 Unsteadiness on feet: Secondary | ICD-10-CM | POA: Diagnosis not present

## 2015-08-02 DIAGNOSIS — M6281 Muscle weakness (generalized): Secondary | ICD-10-CM | POA: Diagnosis not present

## 2015-08-02 DIAGNOSIS — R4189 Other symptoms and signs involving cognitive functions and awareness: Secondary | ICD-10-CM | POA: Diagnosis not present

## 2015-08-02 DIAGNOSIS — R29898 Other symptoms and signs involving the musculoskeletal system: Secondary | ICD-10-CM | POA: Diagnosis not present

## 2015-08-02 DIAGNOSIS — R29818 Other symptoms and signs involving the nervous system: Secondary | ICD-10-CM | POA: Diagnosis not present

## 2015-08-06 DIAGNOSIS — M6281 Muscle weakness (generalized): Secondary | ICD-10-CM | POA: Diagnosis not present

## 2015-08-06 DIAGNOSIS — M5186 Other intervertebral disc disorders, lumbar region: Secondary | ICD-10-CM | POA: Diagnosis not present

## 2015-08-06 DIAGNOSIS — R32 Unspecified urinary incontinence: Secondary | ICD-10-CM | POA: Diagnosis not present

## 2015-08-06 DIAGNOSIS — M545 Low back pain: Secondary | ICD-10-CM | POA: Diagnosis not present

## 2015-08-06 DIAGNOSIS — R4189 Other symptoms and signs involving cognitive functions and awareness: Secondary | ICD-10-CM | POA: Diagnosis not present

## 2015-08-06 DIAGNOSIS — R2681 Unsteadiness on feet: Secondary | ICD-10-CM | POA: Diagnosis not present

## 2015-08-09 DIAGNOSIS — R4189 Other symptoms and signs involving cognitive functions and awareness: Secondary | ICD-10-CM | POA: Diagnosis not present

## 2015-08-09 DIAGNOSIS — M545 Low back pain: Secondary | ICD-10-CM | POA: Diagnosis not present

## 2015-08-09 DIAGNOSIS — R2681 Unsteadiness on feet: Secondary | ICD-10-CM | POA: Diagnosis not present

## 2015-08-09 DIAGNOSIS — M6281 Muscle weakness (generalized): Secondary | ICD-10-CM | POA: Diagnosis not present

## 2015-08-09 DIAGNOSIS — M5186 Other intervertebral disc disorders, lumbar region: Secondary | ICD-10-CM | POA: Diagnosis not present

## 2015-08-09 DIAGNOSIS — R32 Unspecified urinary incontinence: Secondary | ICD-10-CM | POA: Diagnosis not present

## 2015-08-13 DIAGNOSIS — R4189 Other symptoms and signs involving cognitive functions and awareness: Secondary | ICD-10-CM | POA: Diagnosis not present

## 2015-08-13 DIAGNOSIS — R32 Unspecified urinary incontinence: Secondary | ICD-10-CM | POA: Diagnosis not present

## 2015-08-13 DIAGNOSIS — R2681 Unsteadiness on feet: Secondary | ICD-10-CM | POA: Diagnosis not present

## 2015-08-13 DIAGNOSIS — M545 Low back pain: Secondary | ICD-10-CM | POA: Diagnosis not present

## 2015-08-13 DIAGNOSIS — M5186 Other intervertebral disc disorders, lumbar region: Secondary | ICD-10-CM | POA: Diagnosis not present

## 2015-08-13 DIAGNOSIS — M6281 Muscle weakness (generalized): Secondary | ICD-10-CM | POA: Diagnosis not present

## 2015-08-15 DIAGNOSIS — K219 Gastro-esophageal reflux disease without esophagitis: Secondary | ICD-10-CM | POA: Diagnosis not present

## 2015-08-23 ENCOUNTER — Telehealth: Payer: Self-pay

## 2015-08-23 NOTE — Telephone Encounter (Signed)
I spoke with patient to inform her that the lab results for the H. Pylori antigen was not detected. Patient stated that she understood and that if she continued to have stomach issues, she would follow up with her provider.

## 2015-08-29 ENCOUNTER — Ambulatory Visit
Admission: RE | Admit: 2015-08-29 | Discharge: 2015-08-29 | Disposition: A | Discharge status: Home / Self Care | Payer: Medicare Other | Source: Ambulatory Visit | Attending: Nurse Practitioner | Admitting: Nurse Practitioner

## 2015-08-29 DIAGNOSIS — M5440 Lumbago with sciatica, unspecified side: Secondary | ICD-10-CM

## 2015-08-29 DIAGNOSIS — M47816 Spondylosis without myelopathy or radiculopathy, lumbar region: Secondary | ICD-10-CM | POA: Diagnosis not present

## 2015-08-29 DIAGNOSIS — M546 Pain in thoracic spine: Secondary | ICD-10-CM | POA: Diagnosis not present

## 2015-09-07 ENCOUNTER — Telehealth: Payer: Self-pay

## 2015-09-07 NOTE — Telephone Encounter (Signed)
Tywan nurse from [facility] called about x-rays done 08/29/15 on patient. Asked if I could fax them to her. Patient just called also asking for results. [facility] has not reviewed x-rays, will call [facility] to look at the X-rays and let us know what to tell patient. Told patient I will call her back today.

## 2015-09-07 NOTE — Telephone Encounter (Signed)
Spoke with Patient regarding Back X-Ray patient states pain is getting worst and that she agreed to trying therapy.

## 2015-09-11 ENCOUNTER — Encounter: Payer: Self-pay | Admitting: Nurse Practitioner

## 2015-09-17 DIAGNOSIS — M545 Low back pain: Secondary | ICD-10-CM | POA: Diagnosis not present

## 2015-09-17 DIAGNOSIS — M5186 Other intervertebral disc disorders, lumbar region: Secondary | ICD-10-CM | POA: Diagnosis not present

## 2015-09-17 DIAGNOSIS — R2681 Unsteadiness on feet: Secondary | ICD-10-CM | POA: Diagnosis not present

## 2015-09-17 DIAGNOSIS — R32 Unspecified urinary incontinence: Secondary | ICD-10-CM | POA: Diagnosis not present

## 2015-09-17 DIAGNOSIS — M6281 Muscle weakness (generalized): Secondary | ICD-10-CM | POA: Diagnosis not present

## 2015-09-17 DIAGNOSIS — R293 Abnormal posture: Secondary | ICD-10-CM | POA: Diagnosis not present

## 2015-09-17 DIAGNOSIS — R29818 Other symptoms and signs involving the nervous system: Secondary | ICD-10-CM | POA: Diagnosis not present

## 2015-09-17 DIAGNOSIS — R4189 Other symptoms and signs involving cognitive functions and awareness: Secondary | ICD-10-CM | POA: Diagnosis not present

## 2015-09-17 DIAGNOSIS — R29898 Other symptoms and signs involving the musculoskeletal system: Secondary | ICD-10-CM | POA: Diagnosis not present

## 2015-09-19 DIAGNOSIS — R293 Abnormal posture: Secondary | ICD-10-CM | POA: Diagnosis not present

## 2015-09-19 DIAGNOSIS — M545 Low back pain: Secondary | ICD-10-CM | POA: Diagnosis not present

## 2015-09-19 DIAGNOSIS — M6281 Muscle weakness (generalized): Secondary | ICD-10-CM | POA: Diagnosis not present

## 2015-09-19 DIAGNOSIS — R4189 Other symptoms and signs involving cognitive functions and awareness: Secondary | ICD-10-CM | POA: Diagnosis not present

## 2015-09-19 DIAGNOSIS — M5186 Other intervertebral disc disorders, lumbar region: Secondary | ICD-10-CM | POA: Diagnosis not present

## 2015-09-19 DIAGNOSIS — R2681 Unsteadiness on feet: Secondary | ICD-10-CM | POA: Diagnosis not present

## 2015-09-24 DIAGNOSIS — M545 Low back pain: Secondary | ICD-10-CM | POA: Diagnosis not present

## 2015-09-24 DIAGNOSIS — M6281 Muscle weakness (generalized): Secondary | ICD-10-CM | POA: Diagnosis not present

## 2015-09-24 DIAGNOSIS — R4189 Other symptoms and signs involving cognitive functions and awareness: Secondary | ICD-10-CM | POA: Diagnosis not present

## 2015-09-24 DIAGNOSIS — R2681 Unsteadiness on feet: Secondary | ICD-10-CM | POA: Diagnosis not present

## 2015-09-24 DIAGNOSIS — M5186 Other intervertebral disc disorders, lumbar region: Secondary | ICD-10-CM | POA: Diagnosis not present

## 2015-09-24 DIAGNOSIS — R293 Abnormal posture: Secondary | ICD-10-CM | POA: Diagnosis not present

## 2015-09-26 DIAGNOSIS — M6281 Muscle weakness (generalized): Secondary | ICD-10-CM | POA: Diagnosis not present

## 2015-09-26 DIAGNOSIS — M545 Low back pain: Secondary | ICD-10-CM | POA: Diagnosis not present

## 2015-09-26 DIAGNOSIS — M5186 Other intervertebral disc disorders, lumbar region: Secondary | ICD-10-CM | POA: Diagnosis not present

## 2015-09-26 DIAGNOSIS — R4189 Other symptoms and signs involving cognitive functions and awareness: Secondary | ICD-10-CM | POA: Diagnosis not present

## 2015-09-26 DIAGNOSIS — R293 Abnormal posture: Secondary | ICD-10-CM | POA: Diagnosis not present

## 2015-09-26 DIAGNOSIS — R2681 Unsteadiness on feet: Secondary | ICD-10-CM | POA: Diagnosis not present

## 2015-09-28 DIAGNOSIS — R4189 Other symptoms and signs involving cognitive functions and awareness: Secondary | ICD-10-CM | POA: Diagnosis not present

## 2015-09-28 DIAGNOSIS — M545 Low back pain: Secondary | ICD-10-CM | POA: Diagnosis not present

## 2015-09-28 DIAGNOSIS — M5186 Other intervertebral disc disorders, lumbar region: Secondary | ICD-10-CM | POA: Diagnosis not present

## 2015-09-28 DIAGNOSIS — R293 Abnormal posture: Secondary | ICD-10-CM | POA: Diagnosis not present

## 2015-09-28 DIAGNOSIS — M6281 Muscle weakness (generalized): Secondary | ICD-10-CM | POA: Diagnosis not present

## 2015-09-28 DIAGNOSIS — R2681 Unsteadiness on feet: Secondary | ICD-10-CM | POA: Diagnosis not present

## 2015-10-01 DIAGNOSIS — M545 Low back pain: Secondary | ICD-10-CM | POA: Diagnosis not present

## 2015-10-01 DIAGNOSIS — R29818 Other symptoms and signs involving the nervous system: Secondary | ICD-10-CM | POA: Diagnosis not present

## 2015-10-01 DIAGNOSIS — R32 Unspecified urinary incontinence: Secondary | ICD-10-CM | POA: Diagnosis not present

## 2015-10-01 DIAGNOSIS — R2681 Unsteadiness on feet: Secondary | ICD-10-CM | POA: Diagnosis not present

## 2015-10-01 DIAGNOSIS — M5186 Other intervertebral disc disorders, lumbar region: Secondary | ICD-10-CM | POA: Diagnosis not present

## 2015-10-01 DIAGNOSIS — M6281 Muscle weakness (generalized): Secondary | ICD-10-CM | POA: Diagnosis not present

## 2015-10-01 DIAGNOSIS — R293 Abnormal posture: Secondary | ICD-10-CM | POA: Diagnosis not present

## 2015-10-01 DIAGNOSIS — R29898 Other symptoms and signs involving the musculoskeletal system: Secondary | ICD-10-CM | POA: Diagnosis not present

## 2015-10-01 DIAGNOSIS — R4189 Other symptoms and signs involving cognitive functions and awareness: Secondary | ICD-10-CM | POA: Diagnosis not present

## 2015-10-03 DIAGNOSIS — M5186 Other intervertebral disc disorders, lumbar region: Secondary | ICD-10-CM | POA: Diagnosis not present

## 2015-10-03 DIAGNOSIS — R2681 Unsteadiness on feet: Secondary | ICD-10-CM | POA: Diagnosis not present

## 2015-10-03 DIAGNOSIS — R4189 Other symptoms and signs involving cognitive functions and awareness: Secondary | ICD-10-CM | POA: Diagnosis not present

## 2015-10-03 DIAGNOSIS — M6281 Muscle weakness (generalized): Secondary | ICD-10-CM | POA: Diagnosis not present

## 2015-10-03 DIAGNOSIS — R293 Abnormal posture: Secondary | ICD-10-CM | POA: Diagnosis not present

## 2015-10-03 DIAGNOSIS — M545 Low back pain: Secondary | ICD-10-CM | POA: Diagnosis not present

## 2015-10-05 DIAGNOSIS — R2681 Unsteadiness on feet: Secondary | ICD-10-CM | POA: Diagnosis not present

## 2015-10-05 DIAGNOSIS — M5186 Other intervertebral disc disorders, lumbar region: Secondary | ICD-10-CM | POA: Diagnosis not present

## 2015-10-05 DIAGNOSIS — R4189 Other symptoms and signs involving cognitive functions and awareness: Secondary | ICD-10-CM | POA: Diagnosis not present

## 2015-10-05 DIAGNOSIS — M545 Low back pain: Secondary | ICD-10-CM | POA: Diagnosis not present

## 2015-10-05 DIAGNOSIS — R293 Abnormal posture: Secondary | ICD-10-CM | POA: Diagnosis not present

## 2015-10-05 DIAGNOSIS — M6281 Muscle weakness (generalized): Secondary | ICD-10-CM | POA: Diagnosis not present

## 2015-10-08 DIAGNOSIS — M545 Low back pain: Secondary | ICD-10-CM | POA: Diagnosis not present

## 2015-10-08 DIAGNOSIS — R2681 Unsteadiness on feet: Secondary | ICD-10-CM | POA: Diagnosis not present

## 2015-10-08 DIAGNOSIS — R4189 Other symptoms and signs involving cognitive functions and awareness: Secondary | ICD-10-CM | POA: Diagnosis not present

## 2015-10-08 DIAGNOSIS — M5186 Other intervertebral disc disorders, lumbar region: Secondary | ICD-10-CM | POA: Diagnosis not present

## 2015-10-08 DIAGNOSIS — R293 Abnormal posture: Secondary | ICD-10-CM | POA: Diagnosis not present

## 2015-10-08 DIAGNOSIS — M6281 Muscle weakness (generalized): Secondary | ICD-10-CM | POA: Diagnosis not present

## 2015-10-10 DIAGNOSIS — M545 Low back pain: Secondary | ICD-10-CM | POA: Diagnosis not present

## 2015-10-10 DIAGNOSIS — R293 Abnormal posture: Secondary | ICD-10-CM | POA: Diagnosis not present

## 2015-10-10 DIAGNOSIS — R4189 Other symptoms and signs involving cognitive functions and awareness: Secondary | ICD-10-CM | POA: Diagnosis not present

## 2015-10-10 DIAGNOSIS — M6281 Muscle weakness (generalized): Secondary | ICD-10-CM | POA: Diagnosis not present

## 2015-10-10 DIAGNOSIS — M5186 Other intervertebral disc disorders, lumbar region: Secondary | ICD-10-CM | POA: Diagnosis not present

## 2015-10-10 DIAGNOSIS — R2681 Unsteadiness on feet: Secondary | ICD-10-CM | POA: Diagnosis not present

## 2015-10-15 DIAGNOSIS — R2681 Unsteadiness on feet: Secondary | ICD-10-CM | POA: Diagnosis not present

## 2015-10-15 DIAGNOSIS — M6281 Muscle weakness (generalized): Secondary | ICD-10-CM | POA: Diagnosis not present

## 2015-10-15 DIAGNOSIS — M545 Low back pain: Secondary | ICD-10-CM | POA: Diagnosis not present

## 2015-10-15 DIAGNOSIS — R293 Abnormal posture: Secondary | ICD-10-CM | POA: Diagnosis not present

## 2015-10-15 DIAGNOSIS — M5186 Other intervertebral disc disorders, lumbar region: Secondary | ICD-10-CM | POA: Diagnosis not present

## 2015-10-15 DIAGNOSIS — R4189 Other symptoms and signs involving cognitive functions and awareness: Secondary | ICD-10-CM | POA: Diagnosis not present

## 2015-10-17 ENCOUNTER — Other Ambulatory Visit: Payer: Self-pay | Admitting: Internal Medicine

## 2015-10-17 DIAGNOSIS — R4189 Other symptoms and signs involving cognitive functions and awareness: Secondary | ICD-10-CM | POA: Diagnosis not present

## 2015-10-17 DIAGNOSIS — R293 Abnormal posture: Secondary | ICD-10-CM | POA: Diagnosis not present

## 2015-10-17 DIAGNOSIS — M6281 Muscle weakness (generalized): Secondary | ICD-10-CM | POA: Diagnosis not present

## 2015-10-17 DIAGNOSIS — M5186 Other intervertebral disc disorders, lumbar region: Secondary | ICD-10-CM | POA: Diagnosis not present

## 2015-10-17 DIAGNOSIS — M545 Low back pain: Secondary | ICD-10-CM | POA: Diagnosis not present

## 2015-10-17 DIAGNOSIS — R2681 Unsteadiness on feet: Secondary | ICD-10-CM | POA: Diagnosis not present

## 2015-10-19 DIAGNOSIS — M5186 Other intervertebral disc disorders, lumbar region: Secondary | ICD-10-CM | POA: Diagnosis not present

## 2015-10-19 DIAGNOSIS — R4189 Other symptoms and signs involving cognitive functions and awareness: Secondary | ICD-10-CM | POA: Diagnosis not present

## 2015-10-19 DIAGNOSIS — R2681 Unsteadiness on feet: Secondary | ICD-10-CM | POA: Diagnosis not present

## 2015-10-19 DIAGNOSIS — M545 Low back pain: Secondary | ICD-10-CM | POA: Diagnosis not present

## 2015-10-19 DIAGNOSIS — R293 Abnormal posture: Secondary | ICD-10-CM | POA: Diagnosis not present

## 2015-10-19 DIAGNOSIS — M6281 Muscle weakness (generalized): Secondary | ICD-10-CM | POA: Diagnosis not present

## 2015-10-22 DIAGNOSIS — R293 Abnormal posture: Secondary | ICD-10-CM | POA: Diagnosis not present

## 2015-10-22 DIAGNOSIS — M545 Low back pain: Secondary | ICD-10-CM | POA: Diagnosis not present

## 2015-10-22 DIAGNOSIS — R2681 Unsteadiness on feet: Secondary | ICD-10-CM | POA: Diagnosis not present

## 2015-10-22 DIAGNOSIS — R4189 Other symptoms and signs involving cognitive functions and awareness: Secondary | ICD-10-CM | POA: Diagnosis not present

## 2015-10-22 DIAGNOSIS — M6281 Muscle weakness (generalized): Secondary | ICD-10-CM | POA: Diagnosis not present

## 2015-10-22 DIAGNOSIS — M5186 Other intervertebral disc disorders, lumbar region: Secondary | ICD-10-CM | POA: Diagnosis not present

## 2015-10-24 DIAGNOSIS — R2681 Unsteadiness on feet: Secondary | ICD-10-CM | POA: Diagnosis not present

## 2015-10-24 DIAGNOSIS — M545 Low back pain: Secondary | ICD-10-CM | POA: Diagnosis not present

## 2015-10-24 DIAGNOSIS — R4189 Other symptoms and signs involving cognitive functions and awareness: Secondary | ICD-10-CM | POA: Diagnosis not present

## 2015-10-24 DIAGNOSIS — M5186 Other intervertebral disc disorders, lumbar region: Secondary | ICD-10-CM | POA: Diagnosis not present

## 2015-10-24 DIAGNOSIS — R293 Abnormal posture: Secondary | ICD-10-CM | POA: Diagnosis not present

## 2015-10-24 DIAGNOSIS — M6281 Muscle weakness (generalized): Secondary | ICD-10-CM | POA: Diagnosis not present

## 2015-10-26 DIAGNOSIS — M545 Low back pain: Secondary | ICD-10-CM | POA: Diagnosis not present

## 2015-10-26 DIAGNOSIS — M6281 Muscle weakness (generalized): Secondary | ICD-10-CM | POA: Diagnosis not present

## 2015-10-26 DIAGNOSIS — M5186 Other intervertebral disc disorders, lumbar region: Secondary | ICD-10-CM | POA: Diagnosis not present

## 2015-10-26 DIAGNOSIS — R4189 Other symptoms and signs involving cognitive functions and awareness: Secondary | ICD-10-CM | POA: Diagnosis not present

## 2015-10-26 DIAGNOSIS — R2681 Unsteadiness on feet: Secondary | ICD-10-CM | POA: Diagnosis not present

## 2015-10-26 DIAGNOSIS — R293 Abnormal posture: Secondary | ICD-10-CM | POA: Diagnosis not present

## 2015-10-29 DIAGNOSIS — R293 Abnormal posture: Secondary | ICD-10-CM | POA: Diagnosis not present

## 2015-10-29 DIAGNOSIS — M545 Low back pain: Secondary | ICD-10-CM | POA: Diagnosis not present

## 2015-10-29 DIAGNOSIS — R32 Unspecified urinary incontinence: Secondary | ICD-10-CM | POA: Diagnosis not present

## 2015-10-29 DIAGNOSIS — R29818 Other symptoms and signs involving the nervous system: Secondary | ICD-10-CM | POA: Diagnosis not present

## 2015-10-29 DIAGNOSIS — R29898 Other symptoms and signs involving the musculoskeletal system: Secondary | ICD-10-CM | POA: Diagnosis not present

## 2015-10-29 DIAGNOSIS — R4189 Other symptoms and signs involving cognitive functions and awareness: Secondary | ICD-10-CM | POA: Diagnosis not present

## 2015-10-29 DIAGNOSIS — R2681 Unsteadiness on feet: Secondary | ICD-10-CM | POA: Diagnosis not present

## 2015-10-29 DIAGNOSIS — M5186 Other intervertebral disc disorders, lumbar region: Secondary | ICD-10-CM | POA: Diagnosis not present

## 2015-10-29 DIAGNOSIS — M6281 Muscle weakness (generalized): Secondary | ICD-10-CM | POA: Diagnosis not present

## 2015-11-01 ENCOUNTER — Non-Acute Institutional Stay: Payer: Medicare Other | Admitting: Nurse Practitioner

## 2015-11-01 ENCOUNTER — Encounter: Payer: Self-pay | Admitting: Nurse Practitioner

## 2015-11-01 VITALS — BP 144/72 | HR 78 | Temp 97.8°F | Ht 61.0 in | Wt 115.6 lb

## 2015-11-01 DIAGNOSIS — M5440 Lumbago with sciatica, unspecified side: Secondary | ICD-10-CM

## 2015-11-01 DIAGNOSIS — E1142 Type 2 diabetes mellitus with diabetic polyneuropathy: Secondary | ICD-10-CM

## 2015-11-01 DIAGNOSIS — E0841 Diabetes mellitus due to underlying condition with diabetic mononeuropathy: Secondary | ICD-10-CM | POA: Diagnosis not present

## 2015-11-01 DIAGNOSIS — R413 Other amnesia: Secondary | ICD-10-CM

## 2015-11-01 DIAGNOSIS — I10 Essential (primary) hypertension: Secondary | ICD-10-CM

## 2015-11-01 DIAGNOSIS — K219 Gastro-esophageal reflux disease without esophagitis: Secondary | ICD-10-CM | POA: Diagnosis not present

## 2015-11-02 NOTE — Progress Notes (Signed)
Patient ID: [name], female   DOB: 20-Jun-1926, 82 y.o.   MRN: [id number]   Location:  [facility] (12) Provider: Marlana Latus NP  Code Status: DNR Goals of Care:  Advanced Directives 11/01/2015  Does patient have an advance directive? No  Type of Advance Directive -  Copy of advanced directive(s) in chart? -  Would patient like information on creating an advanced directive? No - patient declined information     Chief Complaint  Patient presents with  . Medical Management of Chronic Issues    Routine visit    HPI: Patient is a 82 y.o. female seen today to evaluate chronic medical conditions. Hx of hypertension, blood pressure is controlled on Losartan 50mg  daily. GERD stable, taking Omeprazole 20mg  daily. Chronic back pain, left knee pain, s/p ortho consultation, PT, no improvement.   Past Medical History  Diagnosis Date  . Type II or unspecified type diabetes mellitus without mention of complication, uncontrolled   . Keratoconjunctivitis sicca, not specified as Sjogren's   . Loss of weight   . Unspecified vitamin D deficiency   . Contact dermatitis and other eczema, due to unspecified cause   . Cortical senile cataract   . Irritable bowel syndrome   . Other and unspecified hyperlipidemia   . Unspecified hereditary and idiopathic peripheral neuropathy   . Macular degeneration (senile) of retina, unspecified   . Unspecified essential hypertension   . Reflux esophagitis   . Pain in joint, site unspecified   . Pathologic fracture of vertebrae ([place])   . Scoliosis (and kyphoscoliosis), idiopathic   . Memory loss   . Senile osteoporosis   . GERD (gastroesophageal reflux disease) 02/17/2013  . Other malaise and fatigue 06/17/2010  . Hyperparathyroidism, unspecified ([place]) 03/26/2010    Past Surgical History  Procedure Laterality Date  . Appendectomy    . Spine surgery      vertebroplasty T10, T12  . Spine surgery  11/02/2001     C4-5 &  C5-6 Titanium Plate Placed Dr. Carloyn Manner  . Medial partial knee replacement Left 2004    Dr. Ronnie Derby  . Fibroidectomy  1950  . Hemorrhoid surgery  1978  . Cholecystectomy  2004    Dr. Zella Richer  . Thyroidectomy, partial Left 2007  . Kyphoplasty  2010    T10 & T12 Dr. Erline Levine  . Breast lumpectomy  02/05/2010    Dr. Christie Beckers  . Total knee arthroplasty Right 2009    Dr. Alvan Dame   . Colonoscopy  2001    Dr. Collene Mares  . Cataract extraction w/ intraocular lens  implant, bilateral  04/2013    Dr. Bing Plume    Allergies  Allergen Reactions  . Bactrim     unknown  . Betadine [Povidone Iodine]     unknown  . Metformin And Related     Hurt stomach  . Nutmeg Oil (Myristica Oil)     unknown  . Sulfa Antibiotics     unknown      Medication List       This list is accurate as of: 11/01/15 11:59 PM.  Always use your most recent med list.               losartan 50 MG tablet  Commonly known as:  COZAAR  TAKE 1 TABLET ONCE DAILY TO CONTROL BLOOD PRESSURE.     omeprazole 20 MG capsule  Commonly known as:  PRILOSEC  TAKE 1 CAPSULE DAILY  FOR STOMACH.     REFRESH 1.4-0.6 % ophthalmic solution  Generic drug:  polyvinyl alcohol-povidone  1-2 drops as needed.     TRUE METRIX BLOOD GLUCOSE TEST test strip  Generic drug:  glucose blood  CHECK BLOOD SUGAR TWICE DAILY.     TRUEPLUS LANCETS 28G Misc  by Does not apply route. Check blood sugar once every other day        Review of Systems:  Review of Systems  Constitutional: Negative for fever.  HENT: Positive for hearing loss.   Respiratory: Negative for cough and shortness of breath.   Cardiovascular: Negative for chest pain, palpitations and leg swelling.  Gastrointestinal: Negative for nausea, vomiting, abdominal pain, diarrhea, constipation and blood in stool.       Epigastric annoying sensation  Genitourinary: Negative for dysuria, urgency, frequency and hematuria.  Musculoskeletal: Positive for myalgias, back pain and arthralgias.        Left back and left knee pain  Skin: Negative.   Neurological: Negative for dizziness, tremors and weakness.       Decreased vibratory sensation in both feet.  Psychiatric/Behavioral: Negative for hallucinations. The patient is not nervous/anxious.     Health Maintenance  Topic Date Due  . ZOSTAVAX  09/06/1987  . PNA vac Low Risk Adult (2 of 2 - PCV13) 06/30/2002  . TETANUS/TDAP  07/01/2011  . HEMOGLOBIN A1C  12/11/2015  . FOOT EXAM  12/21/2015  . INFLUENZA VACCINE  01/29/2016  . OPHTHALMOLOGY EXAM  04/02/2016  . DEXA SCAN  Completed    Physical Exam: Filed Vitals:   11/01/15 1547  BP: 144/72  Pulse: 78  Temp: 97.8 F (36.6 C)  TempSrc: Oral  Height: 5\' 1"  (1.549 m)  Weight: 115 lb 9.6 oz (52.436 kg)   Body mass index is 21.85 kg/(m^2). Physical Exam  Constitutional: She is oriented to person, place, and time. She appears well-developed and well-nourished. No distress.  HENT:  Head: Normocephalic and atraumatic.  Right Ear: External ear normal.  Left Ear: External ear normal.  Nose: Nose normal.  Mouth/Throat: Oropharynx is clear and moist.  Eyes: Conjunctivae and EOM are normal. Pupils are equal, round, and reactive to light.  Corrective lenses. Cataracts.  Neck: Normal range of motion. Neck supple. No JVD present. No tracheal deviation present. No thyromegaly present.  Cardiovascular: Normal rate, regular rhythm, normal heart sounds and intact distal pulses.  Exam reveals no gallop and no friction rub.   No murmur heard. Pulmonary/Chest: Effort normal and breath sounds normal. No respiratory distress. She has no wheezes. She has no rales.  Abdominal: Soft. Bowel sounds are normal. She exhibits no distension and no mass. There is no tenderness.  Musculoskeletal: Normal range of motion. She exhibits no edema or tenderness.  Lymphadenopathy:    She has no cervical adenopathy.  Neurological: She is alert and oriented to person, place, and time. No cranial nerve  deficit.  02/17/14 MMSE 29/30 06/13/14 MMSE 30/30. Passed clock drawing.  Skin: No rash noted. She is not diaphoretic. No erythema. No pallor.  Psychiatric: She has a normal mood and affect. Her behavior is normal. Thought content normal.    Labs reviewed: Basic Metabolic Panel:  Recent Labs  12/12/14 01/16/15 06/12/15  NA 139 140 139  K 4.0 4.2 4.5  BUN 14 15 15   CREATININE 0.8 0.8 0.7  TSH  --  0.74  --    Liver Function Tests:  Recent Labs  12/12/14 01/16/15  AST 16 19  ALT 9  13  ALKPHOS 61 62   No results for input(s): LIPASE, AMYLASE in the last 8760 hours. No results for input(s): AMMONIA in the last 8760 hours. CBC:  Recent Labs  01/16/15  WBC 7.9  HGB 13.5  HCT 40  PLT 264   Lipid Panel:  Recent Labs  12/12/14  CHOL 152  HDL 56  LDLCALC 75  TRIG 105   Lab Results  Component Value Date   HGBA1C 6.7* 06/12/2015    Procedures since last visit: No results found.  Assessment/Plan Diabetic neuropathy Chronic, but not disabling.   DM type 2 with diabetic peripheral neuropathy 01/16/15 Hgb A1c 6.8 Continue diet control.  Update Hgb a1c  Essential hypertension Controlled, continue Losartan 50mg  daily. Update CMP   GERD (gastroesophageal reflux disease) Complaint from epigastric pain, constant, aches, sitting up straight helps, also Omeprazole helps too if she takes. Will obtain X-ray thoracic spine, lumbar spine to evaluate possible.  Also her chronic back pain may contributory too. Obtain H-pylori to r/o peptic ulcer, may GI referral.  Improved, continue Omeprazole 20mg  daily  Lumbago 08/29/15 X-ray T+L spine: Thoracolumbar spine scoliosis and degenerative change. Prior thoracic and lumbar vertebroplasties again noted. No acute abnormality identified.  2. Aortoiliac atherosclerotic vascular disease. Calcified pelvic Fibroid.  S/p ortho consult, PT. Stable.    Memory loss Will obtain TSH, CBC, MMSE     Labs/tests ordered:  @ORDERS @  MMSE, CBC, CMP, TSH, Hgb a1c  Next appt:  Visit date not found

## 2015-11-02 NOTE — Assessment & Plan Note (Signed)
08/29/15 X-ray T+L spine: Thoracolumbar spine scoliosis and degenerative change. Prior thoracic and lumbar vertebroplasties again noted. No acute abnormality identified.  2. Aortoiliac atherosclerotic vascular disease. Calcified pelvic Fibroid.  S/p ortho consult, PT. Stable.

## 2015-11-02 NOTE — Assessment & Plan Note (Signed)
Chronic, but not disabling.

## 2015-11-02 NOTE — Assessment & Plan Note (Signed)
Will obtain TSH, CBC, MMSE

## 2015-11-02 NOTE — Assessment & Plan Note (Signed)
01/16/15 Hgb A1c 6.8 Continue diet control.  Update Hgb a1c

## 2015-11-02 NOTE — Assessment & Plan Note (Signed)
Complaint from epigastric pain, constant, aches, sitting up straight helps, also Omeprazole helps too if she takes. Will obtain X-ray thoracic spine, lumbar spine to evaluate possible.  Also her chronic back pain may contributory too. Obtain H-pylori to r/o peptic ulcer, may GI referral.  Improved, continue Omeprazole 20mg  daily

## 2015-11-02 NOTE — Assessment & Plan Note (Signed)
Controlled, continue Losartan 50mg  daily. Update CMP

## 2015-11-13 ENCOUNTER — Encounter: Payer: Self-pay | Admitting: Internal Medicine

## 2015-11-15 ENCOUNTER — Encounter: Payer: Self-pay | Admitting: Nurse Practitioner

## 2016-02-07 ENCOUNTER — Non-Acute Institutional Stay: Payer: Medicare Other | Admitting: Nurse Practitioner

## 2016-02-07 ENCOUNTER — Encounter: Payer: Self-pay | Admitting: Nurse Practitioner

## 2016-02-07 DIAGNOSIS — I10 Essential (primary) hypertension: Secondary | ICD-10-CM

## 2016-02-07 DIAGNOSIS — E213 Hyperparathyroidism, unspecified: Secondary | ICD-10-CM

## 2016-02-07 DIAGNOSIS — E0841 Diabetes mellitus due to underlying condition with diabetic mononeuropathy: Secondary | ICD-10-CM | POA: Diagnosis not present

## 2016-02-07 DIAGNOSIS — M5440 Lumbago with sciatica, unspecified side: Secondary | ICD-10-CM

## 2016-02-07 DIAGNOSIS — K219 Gastro-esophageal reflux disease without esophagitis: Secondary | ICD-10-CM | POA: Diagnosis not present

## 2016-02-07 DIAGNOSIS — F418 Other specified anxiety disorders: Secondary | ICD-10-CM | POA: Diagnosis not present

## 2016-02-07 NOTE — Assessment & Plan Note (Signed)
Controlled, continue Losartan 50mg  daily. Update CMP

## 2016-02-07 NOTE — Assessment & Plan Note (Addendum)
Chronic, but not disabling. Update Hgb a1c, micro albumin

## 2016-02-07 NOTE — Progress Notes (Signed)
Patient ID: [name], female   DOB: July 16, 1925, 82 y.o.   MRN: [id number]   Location:   [facility]   Place of Service:  Clinic (12) Provider: Marlana Latus NP  Code Status: DNR Goals of Care:  Advanced Directives 02/07/2016  Does patient have an advance directive? No  Type of Advance Directive -  Copy of advanced directive(s) in chart? -  Would patient like information on creating an advanced directive? Yes - Environmental education officer Complaint  Patient presents with  . Acute Visit    abd pain    HPI: Patient is a 82 y.o. female seen today to evaluate chronic medical conditions. Hx of hypertension, blood pressure is controlled on Losartan 50mg  daily.   C/o upset stomach on and off, Omeprazole prn is as effective as prior, wants to take something OTC. Also stated she is depressed but doesn't want take any medicine for it. Early am awake, but still feel rested. C/o aches and pains in the back, right hip, knees, said acupuncture didn't help.   Past Medical History:  Diagnosis Date  . Contact dermatitis and other eczema, due to unspecified cause   . Cortical senile cataract   . GERD (gastroesophageal reflux disease) 02/17/2013  . Hyperparathyroidism, unspecified ([place]) 03/26/2010  . Irritable bowel syndrome   . Keratoconjunctivitis sicca, not specified as Sjogren's   . Loss of weight   . Macular degeneration (senile) of retina, unspecified   . Memory loss   . Other and unspecified hyperlipidemia   . Other malaise and fatigue 06/17/2010  . Pain in joint, site unspecified   . Pathologic fracture of vertebrae ([place])   . Reflux esophagitis   . Scoliosis (and kyphoscoliosis), idiopathic   . Senile osteoporosis   . Type II or unspecified type diabetes mellitus without mention of complication, uncontrolled   . Unspecified essential hypertension   . Unspecified hereditary and idiopathic peripheral neuropathy   . Unspecified vitamin D deficiency     Past Surgical History:    Procedure Laterality Date  . APPENDECTOMY    . BREAST LUMPECTOMY  02/05/2010   Dr. Christie Beckers  . CATARACT EXTRACTION W/ INTRAOCULAR LENS  IMPLANT, BILATERAL  04/2013   Dr. Bing Plume  . CHOLECYSTECTOMY  2004   Dr. Zella Richer  . COLONOSCOPY  2001   Dr. Collene Mares  . fibroidectomy  1950  . [place]  . KYPHOPLASTY  2010   T10 & T12 Dr. Erline Levine  . MEDIAL PARTIAL KNEE REPLACEMENT Left 2004   Dr. Ronnie Derby  . SPINE SURGERY     vertebroplasty T10, T12  . SPINE SURGERY  11/02/2001    C4-5 & C5-6 Titanium Plate Placed Dr. Carloyn Manner  . THYROIDECTOMY, PARTIAL Left 2007  . TOTAL KNEE ARTHROPLASTY Right 2009   Dr. Alvan Dame     Allergies  Allergen Reactions  . Bactrim     unknown  . Betadine [Povidone Iodine]     unknown  . Metformin And Related     Hurt stomach  . Nutmeg Oil (Myristica Oil)     unknown  . Sulfa Antibiotics     unknown      Medication List       Accurate as of 02/07/16  3:29 PM. Always use your most recent med list.          ICAPS Tabs Take 1 tablet by mouth daily.   losartan 50 MG tablet Commonly known as:  COZAAR TAKE 1 TABLET ONCE  DAILY TO CONTROL BLOOD PRESSURE.   omeprazole 20 MG capsule Commonly known as:  PRILOSEC TAKE 1 CAPSULE DAILY FOR STOMACH.   REFRESH 1.4-0.6 % ophthalmic solution Generic drug:  polyvinyl alcohol-povidone 1-2 drops as needed.   TRUE METRIX BLOOD GLUCOSE TEST test strip Generic drug:  glucose blood CHECK BLOOD SUGAR TWICE DAILY.   TRUEPLUS LANCETS 28G Misc by Does not apply route. Check blood sugar once every other day       Review of Systems:  Review of Systems  Constitutional: Negative for fever.  HENT: Positive for hearing loss.   Respiratory: Negative for cough and shortness of breath.   Cardiovascular: Negative for chest pain, palpitations and leg swelling.  Gastrointestinal: Negative for abdominal pain, blood in stool, constipation, diarrhea, nausea and vomiting.       Epigastric annoying sensation   Genitourinary: Negative for dysuria, frequency, hematuria and urgency.       Upset stomach on and off  Musculoskeletal: Positive for arthralgias, back pain and myalgias.       Lower back, right hip, knees pain  Skin: Negative.   Neurological: Negative for dizziness, tremors and weakness.       Decreased vibratory sensation in both feet.  Psychiatric/Behavioral: Positive for dysphoric mood and sleep disturbance. Negative for hallucinations and suicidal ideas. The patient is not nervous/anxious.     Health Maintenance  Topic Date Due  . ZOSTAVAX  09/06/1987  . PNA vac Low Risk Adult (2 of 2 - PCV13) 06/30/2002  . TETANUS/TDAP  07/01/2011  . HEMOGLOBIN A1C  12/11/2015  . FOOT EXAM  12/21/2015  . INFLUENZA VACCINE  01/29/2016  . OPHTHALMOLOGY EXAM  04/02/2016  . DEXA SCAN  Completed    Physical Exam: Vitals:   02/07/16 1446  BP: (!) 122/58  Pulse: 96  Resp: 18  Temp: 97.4 F (36.3 C)  TempSrc: Oral  SpO2: 93%  Weight: 116 lb 12.8 oz (53 kg)  Height: 5\' 1"  (1.549 m)   Body mass index is 22.07 kg/m. Physical Exam  Constitutional: She is oriented to person, place, and time. She appears well-developed and well-nourished. No distress.  HENT:  Head: Normocephalic and atraumatic.  Right Ear: External ear normal.  Left Ear: External ear normal.  Nose: Nose normal.  Mouth/Throat: Oropharynx is clear and moist.  Eyes: Conjunctivae and EOM are normal. Pupils are equal, round, and reactive to light.  Corrective lenses. Cataracts.  Neck: Normal range of motion. Neck supple. No JVD present. No tracheal deviation present. No thyromegaly present.  Cardiovascular: Normal rate, regular rhythm, normal heart sounds and intact distal pulses.  Exam reveals no gallop and no friction rub.   No murmur heard. Pulmonary/Chest: Effort normal and breath sounds normal. No respiratory distress. She has no wheezes. She has no rales.  Abdominal: Soft. Bowel sounds are normal. She exhibits no  distension and no mass. There is no tenderness.  Musculoskeletal: Normal range of motion. She exhibits no edema or tenderness.  Lymphadenopathy:    She has no cervical adenopathy.  Neurological: She is alert and oriented to person, place, and time. No cranial nerve deficit.  02/17/14 MMSE 29/30 06/13/14 MMSE 30/30. Passed clock drawing.  Skin: No rash noted. She is not diaphoretic. No erythema. No pallor.  Psychiatric: She has a normal mood and affect. Her behavior is normal. Thought content normal.    Labs reviewed: Basic Metabolic Panel:  Recent Labs  06/12/15  NA 139  K 4.5  BUN 15  CREATININE 0.7  Liver Function Tests: No results for input(s): AST, ALT, ALKPHOS, BILITOT, PROT, ALBUMIN in the last 8760 hours. No results for input(s): LIPASE, AMYLASE in the last 8760 hours. No results for input(s): AMMONIA in the last 8760 hours. CBC: No results for input(s): WBC, NEUTROABS, HGB, HCT, MCV, PLT in the last 8760 hours. Lipid Panel: No results for input(s): CHOL, HDL, LDLCALC, TRIG, CHOLHDL, LDLDIRECT in the last 8760 hours. Lab Results  Component Value Date   HGBA1C 6.7 (A) 06/12/2015    Procedures since last visit: No results found.  Assessment/Plan Essential hypertension Controlled, continue Losartan 50mg  daily. Update CMP    GERD (gastroesophageal reflux disease) Complaint from epigastric pain in the past, now its upset stomach, Omeprazole is not as effective as prior, OTC Mylanta prn recommended.  Also her chronic back pain may contributory too.  H-pylori negative in the past. Will update CBC, CMP, TSH, Hgb a1c, UA C/S. May GI referral when the patient desires.    Hyperparathyroidism Followed by Dr. Chalmers Cater. Had surgery by Dr. Harlow Asa in 2007.   Diabetic neuropathy Chronic, but not disabling. Update Hgb a1c, micro albumin  Depression with anxiety Early am awake, c/o no close relatives, aches/pains wound get better, upset stomach, don't want take any medicine  for it. Will update labs.   Lumbago 08/29/15 X-ray T+L spine: Thoracolumbar spine scoliosis and degenerative change. Prior thoracic and lumbar vertebroplasties again noted. No acute abnormality identified.  2. Aortoiliac atherosclerotic vascular disease. Calcified pelvic Fibroid.  S/p ortho consult, PT. Acupuncture  Lower back pain, right hip pain, knees pain, still ambulates with a cane.       Labs/tests ordered: CBC, CMP, TSH, Hgb a1c, UA C/S, micro albumin  Next appt:  Visit date not found

## 2016-02-07 NOTE — Assessment & Plan Note (Signed)
Early am awake, c/o no close relatives, aches/pains wound get better, upset stomach, don't want take any medicine for it. Will update labs.

## 2016-02-07 NOTE — Assessment & Plan Note (Signed)
08/29/15 X-ray T+L spine: Thoracolumbar spine scoliosis and degenerative change. Prior thoracic and lumbar vertebroplasties again noted. No acute abnormality identified.  2. Aortoiliac atherosclerotic vascular disease. Calcified pelvic Fibroid.  S/p ortho consult, PT. Acupuncture  Lower back pain, right hip pain, knees pain, still ambulates with a cane.

## 2016-02-07 NOTE — Assessment & Plan Note (Signed)
Complaint from epigastric pain in the past, now its upset stomach, Omeprazole is not as effective as prior, OTC Mylanta prn recommended.  Also her chronic back pain may contributory too.  H-pylori negative in the past. Will update CBC, CMP, TSH, Hgb a1c, UA C/S. May GI referral when the patient desires.

## 2016-02-07 NOTE — Progress Notes (Signed)
Location:     Place of Service:  Clinic (12)  Provider:   Code Status:  Goals of Care:  Advanced Directives 11/01/2015  Does patient have an advance directive? No  Type of Advance Directive -  Copy of advanced directive(s) in chart? -  Would patient like information on creating an advanced directive? No - patient declined information     Chief Complaint  Patient presents with  . Acute Visit    abd pain    HPI: Patient is a 82 y.o. female seen today for an acute visit for  Past Medical History:  Diagnosis Date  . Contact dermatitis and other eczema, due to unspecified cause   . Cortical senile cataract   . GERD (gastroesophageal reflux disease) 02/17/2013  . Hyperparathyroidism, unspecified ([place]) 03/26/2010  . Irritable bowel syndrome   . Keratoconjunctivitis sicca, not specified as Sjogren's   . Loss of weight   . Macular degeneration (senile) of retina, unspecified   . Memory loss   . Other and unspecified hyperlipidemia   . Other malaise and fatigue 06/17/2010  . Pain in joint, site unspecified   . Pathologic fracture of vertebrae ([place])   . Reflux esophagitis   . Scoliosis (and kyphoscoliosis), idiopathic   . Senile osteoporosis   . Type II or unspecified type diabetes mellitus without mention of complication, uncontrolled   . Unspecified essential hypertension   . Unspecified hereditary and idiopathic peripheral neuropathy   . Unspecified vitamin D deficiency     Past Surgical History:  Procedure Laterality Date  . APPENDECTOMY    . BREAST LUMPECTOMY  02/05/2010   Dr. Christie Beckers  . CATARACT EXTRACTION W/ INTRAOCULAR LENS  IMPLANT, BILATERAL  04/2013   Dr. Bing Plume  . CHOLECYSTECTOMY  2004   Dr. Zella Richer  . COLONOSCOPY  2001   Dr. Collene Mares  . fibroidectomy  1950  . [place]  . KYPHOPLASTY  2010   T10 & T12 Dr. Erline Levine  . MEDIAL PARTIAL KNEE REPLACEMENT Left 2004   Dr. Ronnie Derby  . SPINE SURGERY     vertebroplasty T10, T12  . SPINE  SURGERY  11/02/2001    C4-5 & C5-6 Titanium Plate Placed Dr. Carloyn Manner  . THYROIDECTOMY, PARTIAL Left 2007  . TOTAL KNEE ARTHROPLASTY Right 2009   Dr. Alvan Dame     Allergies  Allergen Reactions  . Bactrim     unknown  . Betadine [Povidone Iodine]     unknown  . Metformin And Related     Hurt stomach  . Nutmeg Oil (Myristica Oil)     unknown  . Sulfa Antibiotics     unknown      Medication List       Accurate as of 02/07/16  2:55 PM. Always use your most recent med list.          ICAPS Tabs Take 1 tablet by mouth daily.   losartan 50 MG tablet Commonly known as:  COZAAR TAKE 1 TABLET ONCE DAILY TO CONTROL BLOOD PRESSURE.   omeprazole 20 MG capsule Commonly known as:  PRILOSEC TAKE 1 CAPSULE DAILY FOR STOMACH.   REFRESH 1.4-0.6 % ophthalmic solution Generic drug:  polyvinyl alcohol-povidone 1-2 drops as needed.   TRUE METRIX BLOOD GLUCOSE TEST test strip Generic drug:  glucose blood CHECK BLOOD SUGAR TWICE DAILY.   TRUEPLUS LANCETS 28G Misc by Does not apply route. Check blood sugar once every other day       Review of Systems:  Review  of Systems  Health Maintenance  Topic Date Due  . ZOSTAVAX  09/06/1987  . PNA vac Low Risk Adult (2 of 2 - PCV13) 06/30/2002  . TETANUS/TDAP  07/01/2011  . HEMOGLOBIN A1C  12/11/2015  . FOOT EXAM  12/21/2015  . INFLUENZA VACCINE  01/29/2016  . OPHTHALMOLOGY EXAM  04/02/2016  . DEXA SCAN  Completed    Physical Exam: Vitals:   02/07/16 1446  BP: (!) 122/58  Pulse: 96  Resp: 18  Temp: 97.4 F (36.3 C)  TempSrc: Oral  SpO2: 93%  Weight: 116 lb 12.8 oz (53 kg)  Height: 5\' 1"  (1.549 m)   Body mass index is 22.07 kg/m. Physical Exam  Labs reviewed: Basic Metabolic Panel:  Recent Labs  06/12/15  NA 139  K 4.5  BUN 15  CREATININE 0.7   Liver Function Tests: No results for input(s): AST, ALT, ALKPHOS, BILITOT, PROT, ALBUMIN in the last 8760 hours. No results for input(s): LIPASE, AMYLASE in the last 8760  hours. No results for input(s): AMMONIA in the last 8760 hours. CBC: No results for input(s): WBC, NEUTROABS, HGB, HCT, MCV, PLT in the last 8760 hours. Lipid Panel: No results for input(s): CHOL, HDL, LDLCALC, TRIG, CHOLHDL, LDLDIRECT in the last 8760 hours. Lab Results  Component Value Date   HGBA1C 6.7 (A) 06/12/2015    Procedures since last visit: No results found.  Assessment/Plan There are no diagnoses linked to this encounter.   Labs/tests ordered:  @ORDERS @ Next appt:  05/01/2016

## 2016-02-07 NOTE — Assessment & Plan Note (Signed)
Followed by Dr. Balan. Had surgery by Dr. Gerkin in 2007. 

## 2016-02-11 ENCOUNTER — Other Ambulatory Visit: Payer: Self-pay | Admitting: Internal Medicine

## 2016-02-12 ENCOUNTER — Encounter: Payer: Self-pay | Admitting: Nurse Practitioner

## 2016-02-12 DIAGNOSIS — I10 Essential (primary) hypertension: Secondary | ICD-10-CM | POA: Diagnosis not present

## 2016-02-12 DIAGNOSIS — R509 Fever, unspecified: Secondary | ICD-10-CM | POA: Diagnosis not present

## 2016-02-12 DIAGNOSIS — E1151 Type 2 diabetes mellitus with diabetic peripheral angiopathy without gangrene: Secondary | ICD-10-CM | POA: Diagnosis not present

## 2016-02-12 LAB — CBC AND DIFFERENTIAL
HCT: 37 % (ref 36–46)
Hemoglobin: 12.7 g/dL (ref 12.0–16.0)
Platelets: 275 10*3/uL (ref 150–399)
WBC: 7.2 10^3/mL

## 2016-02-12 LAB — HEPATIC FUNCTION PANEL
ALT: 10 U/L (ref 7–35)
AST: 17 U/L (ref 13–35)
Alkaline Phosphatase: 60 U/L (ref 25–125)
Bilirubin, Total: 0.7 mg/dL

## 2016-02-12 LAB — TSH: TSH: 1.03 u[IU]/mL (ref 0.41–5.90)

## 2016-02-12 LAB — BASIC METABOLIC PANEL
BUN: 15 mg/dL (ref 4–21)
Creatinine: 0.8 mg/dL (ref 0.5–1.1)
Glucose: 124 mg/dL
Potassium: 4.4 mmol/L (ref 3.4–5.3)
Sodium: 138 mmol/L (ref 137–147)

## 2016-02-12 LAB — MICROALBUMIN, URINE: Microalb, Ur: 0.5

## 2016-02-12 LAB — HEMOGLOBIN A1C: Hemoglobin A1C: 6.8

## 2016-02-13 ENCOUNTER — Encounter: Payer: Self-pay | Admitting: *Deleted

## 2016-02-26 ENCOUNTER — Encounter: Payer: Self-pay | Admitting: *Deleted

## 2016-03-20 ENCOUNTER — Encounter: Payer: Self-pay | Admitting: *Deleted

## 2016-03-20 ENCOUNTER — Non-Acute Institutional Stay: Payer: Medicare Other | Admitting: Nurse Practitioner

## 2016-03-20 DIAGNOSIS — E0841 Diabetes mellitus due to underlying condition with diabetic mononeuropathy: Secondary | ICD-10-CM | POA: Diagnosis not present

## 2016-03-20 DIAGNOSIS — I10 Essential (primary) hypertension: Secondary | ICD-10-CM

## 2016-03-20 DIAGNOSIS — R413 Other amnesia: Secondary | ICD-10-CM

## 2016-03-20 DIAGNOSIS — K219 Gastro-esophageal reflux disease without esophagitis: Secondary | ICD-10-CM | POA: Diagnosis not present

## 2016-03-20 DIAGNOSIS — M5440 Lumbago with sciatica, unspecified side: Secondary | ICD-10-CM | POA: Diagnosis not present

## 2016-03-20 DIAGNOSIS — F418 Other specified anxiety disorders: Secondary | ICD-10-CM

## 2016-03-20 DIAGNOSIS — E213 Hyperparathyroidism, unspecified: Secondary | ICD-10-CM | POA: Diagnosis not present

## 2016-03-20 IMAGING — CR DG LUMBAR SPINE COMPLETE 4+V
5 series · 5 of 5 positions shown · non-contrast
Comparison: None.

CLINICAL DATA: Back pain for several months.  No known injury.

EXAM:
LUMBAR SPINE - COMPLETE 4+ VIEW

[t l-spine a.p.]
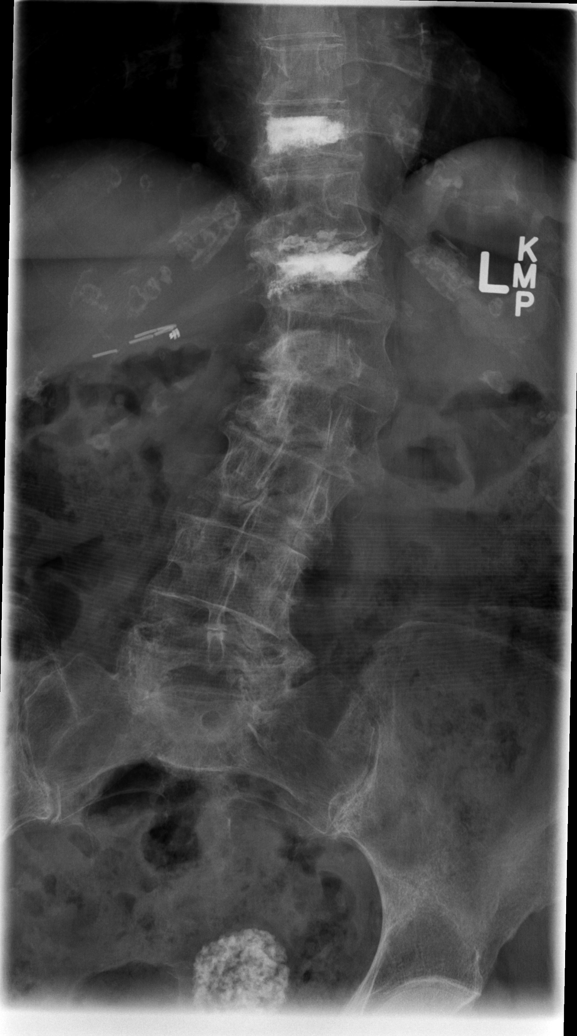

[t l-spine oblique exposure (1 of 2)]
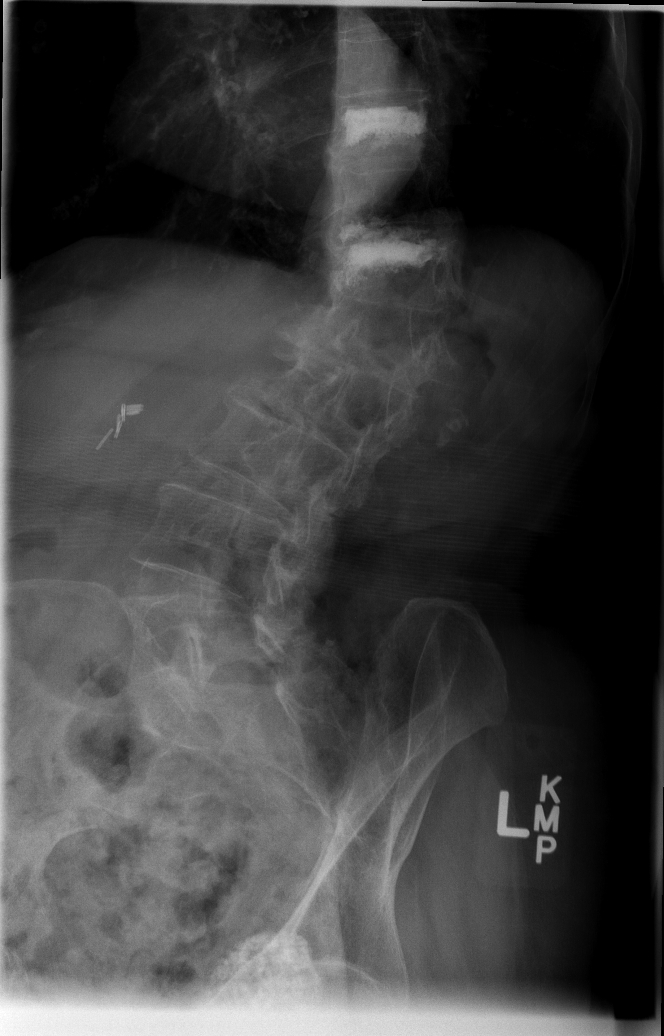

[t l-spine oblique exposure (2 of 2)]
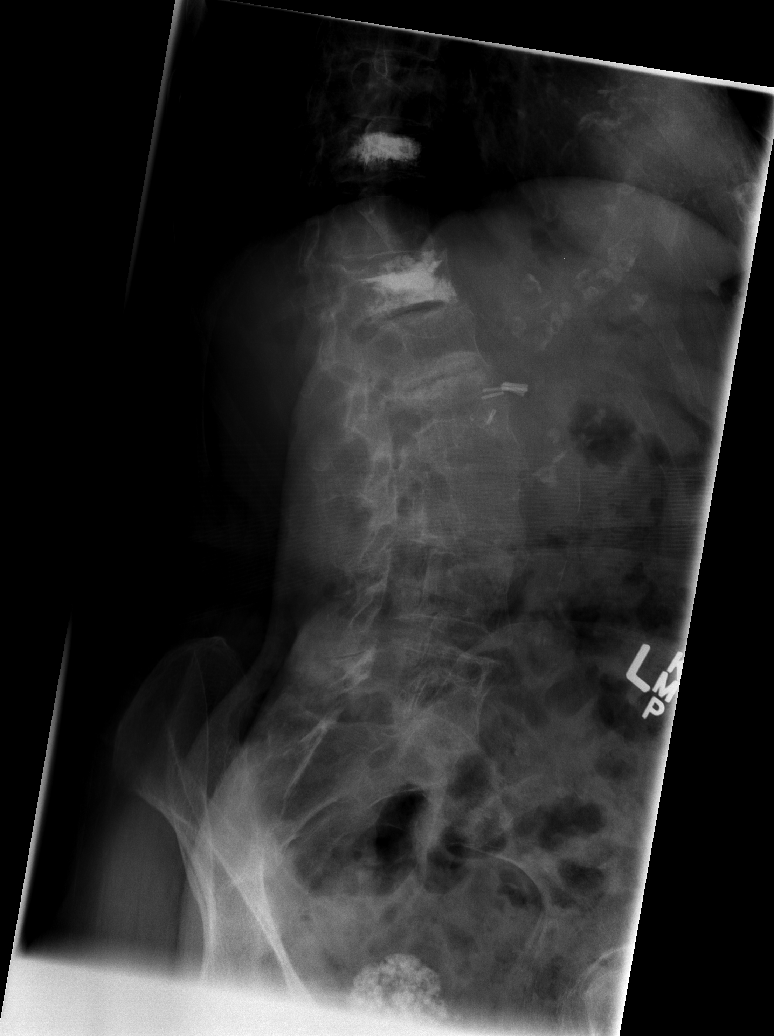

[t l-spine lat]
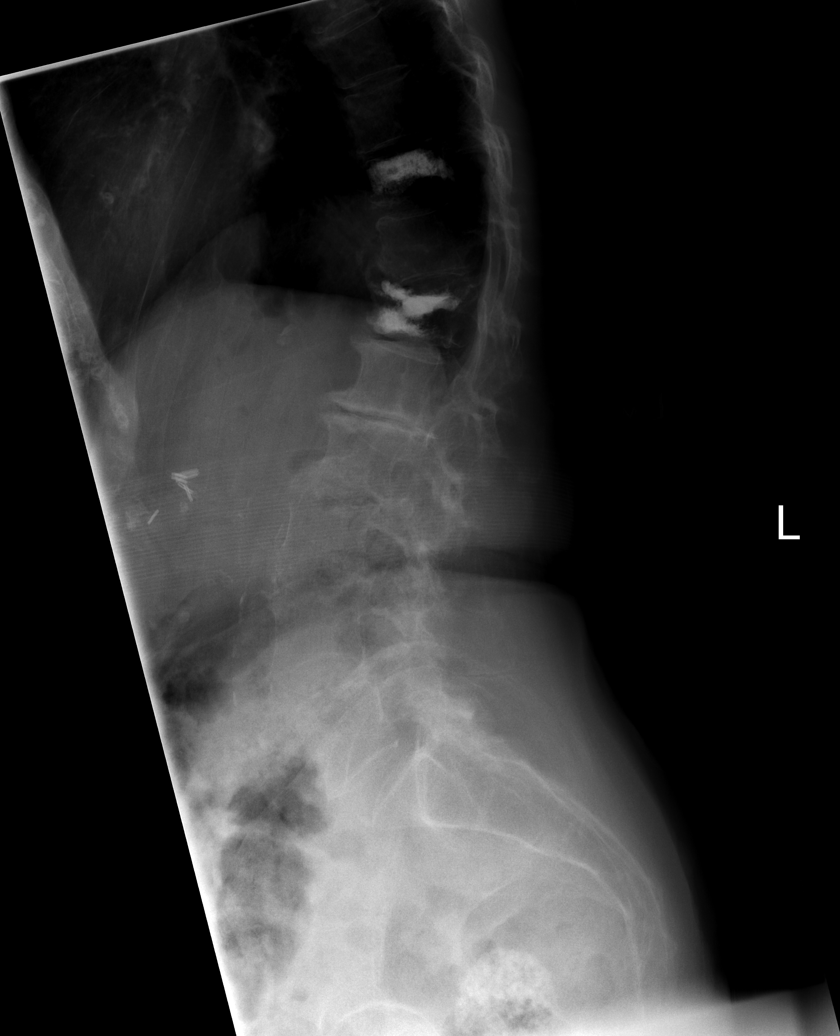

[t l-spine l5-s1 spot]
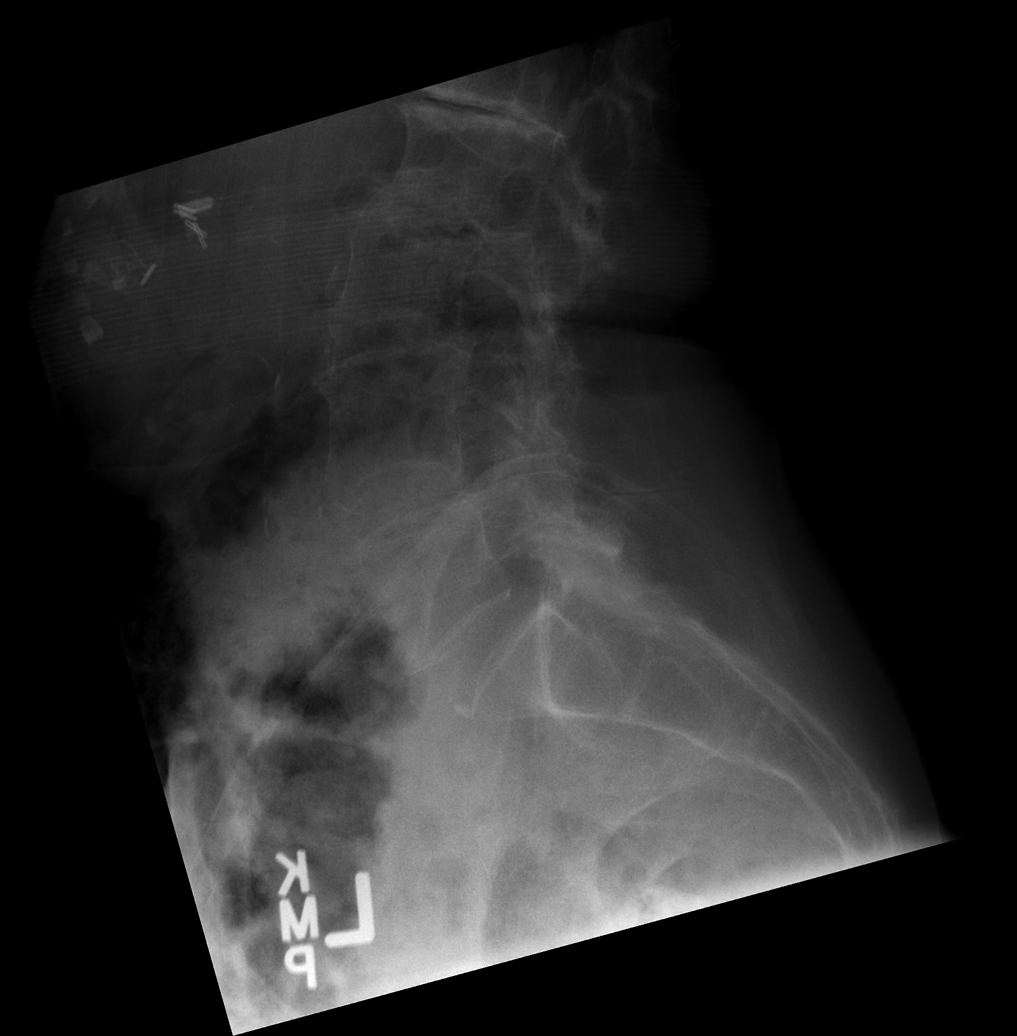

[5 of 5 positions shown; findings below may reference images not displayed]

FINDINGS: Thoracolumbar spine scoliosis with diffuse degenerative change.
Lower thoracic and upper lumbar vertebraplasties again noted. No
acute bony abnormality identified. Exam stable from prior exam.
Surgical clips right upper quadrant. Pelvic calcifications
consistent with fibroid.
IMPRESSION: . Thoracolumbar spine scoliosis and degenerative change. Prior
thoracic and lumbar vertebroplasties again noted. No acute
abnormality identified.

2. Aortoiliac atherosclerotic vascular disease. Calcified pelvic
fibroid.

## 2016-03-20 NOTE — Assessment & Plan Note (Signed)
Chronic, but not disabling. Hgb a1c 6.8

## 2016-03-20 NOTE — Assessment & Plan Note (Signed)
Repetitive.  

## 2016-03-20 NOTE — Assessment & Plan Note (Signed)
C/o epigastric pain in the past, now its upset stomach, Omeprazole and  OTC Mylanta  Also her chronic back pain may contributory too.  H-pylori negative in the past. Unremarkable CBC, CMP, TSH, Hgb a1c, UA C/S. May GI referral when the patient desires.

## 2016-03-20 NOTE — Assessment & Plan Note (Signed)
08/29/15 X-ray T+L spine: Thoracolumbar spine scoliosis and degenerative change. Prior thoracic and lumbar vertebroplasties again noted. No acute abnormality identified.  2. Aortoiliac atherosclerotic vascular disease. Calcified pelvic Fibroid.  S/p ortho consult, PT. Acupuncture  Lower back pain, right hip pain, knees pain, still ambulates with a cane.   Refer to spine specialist.

## 2016-03-20 NOTE — Assessment & Plan Note (Signed)
Early am awake, c/o no close relatives, aches/pains wound get better, upset stomach, don't want take any medicine for it.

## 2016-03-20 NOTE — Progress Notes (Signed)
Patient ID: [name], female   DOB: July 05, 1925, 82 y.o.   MRN: [id number]   Location:   [facility]   Place of Service:  Clinic [facility]  Provider: Marlana Latus NP  Code Status: DNR  Goals of Care: [place]  Advanced Directives 02/07/2016  Does patient have an advance directive? No  Type of Advance Directive -  Copy of advanced directive(s) in chart? -  Would patient like information on creating an advanced directive? Yes - Environmental education officer Complaint  Patient presents with  . Acute Visit    stomach issues x 2 weeks,    HPI: Patient is a 82 y.o. female seen today to evaluate chronic medical conditions. Hx of hypertension, blood pressure is controlled on Losartan 50mg  daily.   C/o upset stomach on and off, Omeprazole. Also stated she is depressed but doesn't want take any medicine for it. Early am awake, but still feel rested. C/o aches and pains in the back, right hip, knees, said acupuncture didn't help.   Past Medical History:  Diagnosis Date  . Contact dermatitis and other eczema, due to unspecified cause   . Cortical senile cataract   . GERD (gastroesophageal reflux disease) 02/17/2013  . Hyperparathyroidism, unspecified ([place]) 03/26/2010  . Irritable bowel syndrome   . Keratoconjunctivitis sicca, not specified as Sjogren's   . Loss of weight   . Macular degeneration (senile) of retina, unspecified   . Memory loss   . Other and unspecified hyperlipidemia   . Other malaise and fatigue 06/17/2010  . Pain in joint, site unspecified   . Pathologic fracture of vertebrae ([place])   . Reflux esophagitis   . Scoliosis (and kyphoscoliosis), idiopathic   . Senile osteoporosis   . Type II or unspecified type diabetes mellitus without mention of complication, uncontrolled   . Unspecified essential hypertension   . Unspecified hereditary and idiopathic peripheral neuropathy   . Unspecified vitamin D deficiency     Past Surgical History:  Procedure Laterality Date  .  APPENDECTOMY    . BREAST LUMPECTOMY  02/05/2010   Dr. Christie Beckers  . CATARACT EXTRACTION W/ INTRAOCULAR LENS  IMPLANT, BILATERAL  04/2013   Dr. Bing Plume  . CHOLECYSTECTOMY  2004   Dr. Zella Richer  . COLONOSCOPY  2001   Dr. Collene Mares  . fibroidectomy  1950  . [place]  . KYPHOPLASTY  2010   T10 & T12 Dr. Erline Levine  . MEDIAL PARTIAL KNEE REPLACEMENT Left 2004   Dr. Ronnie Derby  . SPINE SURGERY     vertebroplasty T10, T12  . SPINE SURGERY  11/02/2001    C4-5 & C5-6 Titanium Plate Placed Dr. Carloyn Manner  . THYROIDECTOMY, PARTIAL Left 2007  . TOTAL KNEE ARTHROPLASTY Right 2009   Dr. Alvan Dame     Allergies  Allergen Reactions  . Bactrim     unknown  . Betadine [Povidone Iodine]     unknown  . Metformin And Related     Hurt stomach  . Nutmeg Oil (Myristica Oil)     unknown  . Sulfa Antibiotics     unknown      Medication List       Accurate as of 03/20/16  3:48 PM. Always use your most recent med list.          ICAPS Tabs Take 1 tablet by mouth daily.   losartan 50 MG tablet Commonly known as:  COZAAR TAKE 1 TABLET ONCE DAILY TO CONTROL BLOOD PRESSURE.  omeprazole 20 MG capsule Commonly known as:  PRILOSEC TAKE 1 CAPSULE DAILY FOR STOMACH.   REFRESH 1.4-0.6 % ophthalmic solution Generic drug:  polyvinyl alcohol-povidone 1-2 drops as needed.   TRUE METRIX BLOOD GLUCOSE TEST test strip Generic drug:  glucose blood CHECK BLOOD SUGAR TWICE DAILY.   TRUEPLUS LANCETS 28G Misc by Does not apply route. Check blood sugar once every other day       Review of Systems:  Review of Systems  Constitutional: Negative for fever.  HENT: Positive for hearing loss.   Respiratory: Negative for cough and shortness of breath.   Cardiovascular: Negative for chest pain, palpitations and leg swelling.  Gastrointestinal: Negative for abdominal pain, blood in stool, constipation, diarrhea, nausea and vomiting.       Epigastric annoying sensation  Genitourinary: Negative for dysuria,  frequency, hematuria and urgency.       Upset stomach on and off  Musculoskeletal: Positive for arthralgias, back pain and myalgias.       Lower back, right hip, knees pain  Skin: Negative.   Neurological: Negative for dizziness, tremors and weakness.       Decreased vibratory sensation in both feet.  Psychiatric/Behavioral: Positive for dysphoric mood and sleep disturbance. Negative for hallucinations and suicidal ideas. The patient is not nervous/anxious.     Health Maintenance  Topic Date Due  . ZOSTAVAX  09/06/1987  . PNA vac Low Risk Adult (2 of 2 - PCV13) 06/30/2002  . TETANUS/TDAP  07/01/2011  . FOOT EXAM  12/21/2015  . INFLUENZA VACCINE  01/29/2016  . OPHTHALMOLOGY EXAM  04/02/2016  . HEMOGLOBIN A1C  08/14/2016  . DEXA SCAN  Completed    Physical Exam: Vitals:   03/20/16 1524  BP: 122/70  Pulse: 95  Resp: 20  Temp: 98.2 F (36.8 C)  SpO2: 98%  Weight: 115 lb 3.2 oz (52.3 kg)  Height: 5\' 1"  (1.549 m)   Body mass index is 21.77 kg/m. Physical Exam  Constitutional: She is oriented to person, place, and time. She appears well-developed and well-nourished. No distress.  HENT:  Head: Normocephalic and atraumatic.  Right Ear: External ear normal.  Left Ear: External ear normal.  Nose: Nose normal.  Mouth/Throat: Oropharynx is clear and moist.  Eyes: Conjunctivae and EOM are normal. Pupils are equal, round, and reactive to light.  Corrective lenses. Cataracts.  Neck: Normal range of motion. Neck supple. No JVD present. No tracheal deviation present. No thyromegaly present.  Cardiovascular: Normal rate, regular rhythm, normal heart sounds and intact distal pulses.  Exam reveals no gallop and no friction rub.   No murmur heard. Pulmonary/Chest: Effort normal and breath sounds normal. No respiratory distress. She has no wheezes. She has no rales.  Abdominal: Soft. Bowel sounds are normal. She exhibits no distension and no mass. There is no tenderness.  Musculoskeletal:  Normal range of motion. She exhibits no edema or tenderness.  Lymphadenopathy:    She has no cervical adenopathy.  Neurological: She is alert and oriented to person, place, and time. No cranial nerve deficit.  02/17/14 MMSE 29/30 06/13/14 MMSE 30/30. Passed clock drawing.  Skin: No rash noted. She is not diaphoretic. No erythema. No pallor.  Psychiatric: She has a normal mood and affect. Her behavior is normal. Thought content normal.    Labs reviewed: Basic Metabolic Panel:  Recent Labs  06/12/15 02/12/16  NA 139 138  K 4.5 4.4  BUN 15 15  CREATININE 0.7 0.8  TSH  --  1.03  Liver Function Tests:  Recent Labs  02/12/16  AST 17  ALT 10  ALKPHOS 60   No results for input(s): LIPASE, AMYLASE in the last 8760 hours. No results for input(s): AMMONIA in the last 8760 hours. CBC:  Recent Labs  02/12/16  WBC 7.2  HGB 12.7  HCT 37  PLT 275   Lipid Panel: No results for input(s): CHOL, HDL, LDLCALC, TRIG, CHOLHDL, LDLDIRECT in the last 8760 hours. Lab Results  Component Value Date   HGBA1C 6.8 02/12/2016    Procedures since last visit: No results found.  Assessment/Plan Essential hypertension Controlled, continue Losartan 50mg  daily  GERD (gastroesophageal reflux disease) C/o epigastric pain in the past, now its upset stomach, Omeprazole and  OTC Mylanta  Also her chronic back pain may contributory too.  H-pylori negative in the past. Unremarkable CBC, CMP, TSH, Hgb a1c, UA C/S. May GI referral when the patient desires.     Hyperparathyroidism Followed by Dr. Chalmers Cater. Had surgery by Dr. Harlow Asa in 2007.  Diabetic neuropathy Chronic, but not disabling. Hgb a1c 6.8  Memory loss Repetitive.  Depression with anxiety Early am awake, c/o no close relatives, aches/pains wound get better, upset stomach, don't want take any medicine for it.    Lumbago 08/29/15 X-ray T+L spine: Thoracolumbar spine scoliosis and degenerative change. Prior thoracic and lumbar  vertebroplasties again noted. No acute abnormality identified.  2. Aortoiliac atherosclerotic vascular disease. Calcified pelvic Fibroid.  S/p ortho consult, PT. Acupuncture  Lower back pain, right hip pain, knees pain, still ambulates with a cane.   Refer to spine specialist.     Labs/tests ordered: GI referal. Ortho f/u  Next appt:  Visit date not found

## 2016-03-20 NOTE — Assessment & Plan Note (Signed)
Followed by Dr. Balan. Had surgery by Dr. Gerkin in 2007. 

## 2016-03-20 NOTE — Assessment & Plan Note (Signed)
Controlled, continue Losartan 50mg daily 

## 2016-03-21 ENCOUNTER — Encounter: Payer: Self-pay | Admitting: Gastroenterology

## 2016-04-03 ENCOUNTER — Non-Acute Institutional Stay: Payer: Medicare Other | Admitting: Nurse Practitioner

## 2016-04-03 ENCOUNTER — Encounter: Payer: Self-pay | Admitting: Nurse Practitioner

## 2016-04-03 DIAGNOSIS — J069 Acute upper respiratory infection, unspecified: Secondary | ICD-10-CM

## 2016-04-03 DIAGNOSIS — K219 Gastro-esophageal reflux disease without esophagitis: Secondary | ICD-10-CM | POA: Diagnosis not present

## 2016-04-03 DIAGNOSIS — R413 Other amnesia: Secondary | ICD-10-CM

## 2016-04-03 DIAGNOSIS — F418 Other specified anxiety disorders: Secondary | ICD-10-CM

## 2016-04-03 DIAGNOSIS — M5440 Lumbago with sciatica, unspecified side: Secondary | ICD-10-CM | POA: Diagnosis not present

## 2016-04-03 DIAGNOSIS — E0841 Diabetes mellitus due to underlying condition with diabetic mononeuropathy: Secondary | ICD-10-CM

## 2016-04-03 DIAGNOSIS — I10 Essential (primary) hypertension: Secondary | ICD-10-CM | POA: Diagnosis not present

## 2016-04-03 NOTE — Progress Notes (Signed)
Patient ID: [name], female   DOB: 1925-08-07, 82 y.o.   MRN: [id number]   Location:   [facility]   Place of Service:  Clinic [facility]  Provider: Marlana Latus NP  Code Status: DNR  Goals of Care: [place]  Advanced Directives 02/07/2016  Does patient have an advance directive? No  Type of Advance Directive -  Copy of advanced directive(s) in chart? -  Would patient like information on creating an advanced directive? Yes - Environmental education officer Complaint  Patient presents with  . Acute Visit    bad cough x3 days with stuffy nose    HPI: Patient is a 82 y.o. female seen today to evaluate chronic medical conditions.    C/o congestive cough, afebrile, denied chest pain, no noted O2 desaturation.   Hx of hypertension, blood pressure is controlled on Losartan 50mg  daily.   C/o upset stomach on and off, off Omeprazole. Also stated she is depressed but doesn't want take any medicine for it. Early am awake, but still feel rested. C/o aches and pains in the back, right hip, knees, said acupuncture didn't help, may Ortho when she desires.   Past Medical History:  Diagnosis Date  . Contact dermatitis and other eczema, due to unspecified cause   . Cortical senile cataract   . GERD (gastroesophageal reflux disease) 02/17/2013  . Hyperparathyroidism, unspecified 03/26/2010  . Irritable bowel syndrome   . Keratoconjunctivitis sicca, not specified as Sjogren's   . Loss of weight   . Macular degeneration (senile) of retina, unspecified   . Memory loss   . Other and unspecified hyperlipidemia   . Other malaise and fatigue 06/17/2010  . Pain in joint, site unspecified   . Pathologic fracture of vertebrae   . Reflux esophagitis   . Scoliosis (and kyphoscoliosis), idiopathic   . Senile osteoporosis   . Type II or unspecified type diabetes mellitus without mention of complication, uncontrolled   . Unspecified essential hypertension   . Unspecified hereditary and idiopathic peripheral  neuropathy   . Unspecified vitamin D deficiency     Past Surgical History:  Procedure Laterality Date  . APPENDECTOMY    . BREAST LUMPECTOMY  02/05/2010   Dr. Christie Beckers  . CATARACT EXTRACTION W/ INTRAOCULAR LENS  IMPLANT, BILATERAL  04/2013   Dr. Bing Plume  . CHOLECYSTECTOMY  2004   Dr. Zella Richer  . COLONOSCOPY  2001   Dr. Collene Mares  . fibroidectomy  1950  . [place]  . KYPHOPLASTY  2010   T10 & T12 Dr. Erline Levine  . MEDIAL PARTIAL KNEE REPLACEMENT Left 2004   Dr. Ronnie Derby  . SPINE SURGERY     vertebroplasty T10, T12  . SPINE SURGERY  11/02/2001    C4-5 & C5-6 Titanium Plate Placed Dr. Carloyn Manner  . THYROIDECTOMY, PARTIAL Left 2007  . TOTAL KNEE ARTHROPLASTY Right 2009   Dr. Alvan Dame     Allergies  Allergen Reactions  . Bactrim     unknown  . Betadine [Povidone Iodine]     unknown  . Metformin And Related     Hurt stomach  . Nutmeg Oil (Myristica Oil)     unknown  . Sulfa Antibiotics     unknown      Medication List       Accurate as of 04/03/16  3:15 PM. Always use your most recent med list.          ICAPS Tabs Take 1 tablet by mouth  daily.   losartan 50 MG tablet Commonly known as:  COZAAR TAKE 1 TABLET ONCE DAILY TO CONTROL BLOOD PRESSURE.   REFRESH 1.4-0.6 % ophthalmic solution Generic drug:  polyvinyl alcohol-povidone 1-2 drops as needed.   TRUE METRIX BLOOD GLUCOSE TEST test strip Generic drug:  glucose blood CHECK BLOOD SUGAR TWICE DAILY.   TRUEPLUS LANCETS 28G Misc by Does not apply route. Check blood sugar once every other day       Review of Systems:  Review of Systems  Constitutional: Negative for fever.  HENT: Positive for congestion and hearing loss.   Respiratory: Positive for cough. Negative for shortness of breath.   Cardiovascular: Negative for chest pain, palpitations and leg swelling.  Gastrointestinal: Negative for abdominal pain, blood in stool, constipation, diarrhea, nausea and vomiting.       Epigastric annoying  sensation  Genitourinary: Negative for dysuria, frequency, hematuria and urgency.       Upset stomach on and off  Musculoskeletal: Positive for arthralgias, back pain and myalgias.       Lower back, right hip, knees pain  Skin: Negative.   Neurological: Negative for dizziness, tremors and weakness.       Decreased vibratory sensation in both feet.  Psychiatric/Behavioral: Positive for dysphoric mood and sleep disturbance. Negative for hallucinations and suicidal ideas. The patient is not nervous/anxious.     Health Maintenance  Topic Date Due  . ZOSTAVAX  09/06/1987  . PNA vac Low Risk Adult (2 of 2 - PCV13) 06/30/2002  . TETANUS/TDAP  07/01/2011  . FOOT EXAM  12/21/2015  . INFLUENZA VACCINE  01/29/2016  . OPHTHALMOLOGY EXAM  04/02/2016  . HEMOGLOBIN A1C  08/14/2016  . DEXA SCAN  Completed    Physical Exam: Vitals:   04/03/16 1452  BP: 120/60  Pulse: 97  Resp: 20  Temp: 98.3 F (36.8 C)  Weight: 110 lb 9.6 oz (50.2 kg)   Body mass index is 20.9 kg/m. Physical Exam  Constitutional: She is oriented to person, place, and time. She appears well-developed and well-nourished. No distress.  HENT:  Head: Normocephalic and atraumatic.  Right Ear: External ear normal.  Left Ear: External ear normal.  Nose: Nose normal.  Mouth/Throat: Oropharynx is clear and moist.  Eyes: Conjunctivae and EOM are normal. Pupils are equal, round, and reactive to light.  Corrective lenses. Cataracts.  Neck: Normal range of motion. Neck supple. No JVD present. No tracheal deviation present. No thyromegaly present.  Cardiovascular: Normal rate, regular rhythm, normal heart sounds and intact distal pulses.  Exam reveals no gallop and no friction rub.   No murmur heard. Pulmonary/Chest: Effort normal and breath sounds normal. No respiratory distress. She has no wheezes. She has no rales.  Abdominal: Soft. Bowel sounds are normal. She exhibits no distension and no mass. There is no tenderness.    Musculoskeletal: Normal range of motion. She exhibits no edema or tenderness.  Lymphadenopathy:    She has no cervical adenopathy.  Neurological: She is alert and oriented to person, place, and time. No cranial nerve deficit.  02/17/14 MMSE 29/30 06/13/14 MMSE 30/30. Passed clock drawing.  Skin: No rash noted. She is not diaphoretic. No erythema. No pallor.  Psychiatric: She has a normal mood and affect. Her behavior is normal. Thought content normal.    Labs reviewed: Basic Metabolic Panel:  Recent Labs  06/12/15 02/12/16  NA 139 138  K 4.5 4.4  BUN 15 15  CREATININE 0.7 0.8  TSH  --  1.03  Liver Function Tests:  Recent Labs  02/12/16  AST 17  ALT 10  ALKPHOS 60   No results for input(s): LIPASE, AMYLASE in the last 8760 hours. No results for input(s): AMMONIA in the last 8760 hours. CBC:  Recent Labs  02/12/16  WBC 7.2  HGB 12.7  HCT 37  PLT 275   Lipid Panel: No results for input(s): CHOL, HDL, LDLCALC, TRIG, CHOLHDL, LDLDIRECT in the last 8760 hours. Lab Results  Component Value Date   HGBA1C 6.8 02/12/2016    Procedures since last visit: No results found.  Assessment/Plan Essential hypertension Controlled, continue Losartan 50mg  daily   GERD (gastroesophageal reflux disease) C/o epigastric pain in the past, now its upset stomach, Omeprazole and  OTC Mylanta  Also her chronic back pain may contributory too.  H-pylori negative in the past. Unremarkable CBC, CMP, TSH, Hgb a1c, UA C/S. May GI referral when the patient desires.      Diabetic neuropathy Chronic, but not disabling. Hgb a1c 6.8   Memory loss Repetitive.  Lumbago 08/29/15 X-ray T+L spine: Thoracolumbar spine scoliosis and degenerative change. Prior thoracic and lumbar vertebroplasties again noted. No acute abnormality identified.  2. Aortoiliac atherosclerotic vascular disease. Calcified pelvic Fibroid.  S/p ortho consult, PT. Acupuncture  Lower back pain, right hip pain,  knees pain, still ambulates with a cane.   Refer to spine specialist.    Depression with anxiety Early am awake, c/o no close relatives, aches/pains wound get better, upset stomach, don't want take any medicine for it.     Acute upper respiratory infection Augmentin 875mg  q12h x 5 days, Mucinex 600mg  bid x 5 days.     Labs/tests ordered: GI referal. Ortho f/u  Next appt:  Visit date not found

## 2016-04-03 NOTE — Assessment & Plan Note (Signed)
C/o epigastric pain in the past, now its upset stomach, Omeprazole and  OTC Mylanta  Also her chronic back pain may contributory too.  H-pylori negative in the past. Unremarkable CBC, CMP, TSH, Hgb a1c, UA C/S. May GI referral when the patient desires.

## 2016-04-03 NOTE — Assessment & Plan Note (Signed)
08/29/15 X-ray T+L spine: Thoracolumbar spine scoliosis and degenerative change. Prior thoracic and lumbar vertebroplasties again noted. No acute abnormality identified.  2. Aortoiliac atherosclerotic vascular disease. Calcified pelvic Fibroid.  S/p ortho consult, PT. Acupuncture  Lower back pain, right hip pain, knees pain, still ambulates with a cane.   Refer to spine specialist.

## 2016-04-03 NOTE — Assessment & Plan Note (Signed)
Chronic, but not disabling. Hgb a1c 6.8

## 2016-04-03 NOTE — Assessment & Plan Note (Signed)
Controlled, continue Losartan 50mg daily 

## 2016-04-03 NOTE — Assessment & Plan Note (Signed)
Repetitive.  

## 2016-04-03 NOTE — Assessment & Plan Note (Signed)
Early am awake, c/o no close relatives, aches/pains wound get better, upset stomach, don't want take any medicine for it.

## 2016-04-03 NOTE — Assessment & Plan Note (Signed)
Augmentin 875mg  q12h x 5 days, Mucinex 600mg  bid x 5 days.

## 2016-04-14 ENCOUNTER — Other Ambulatory Visit: Payer: Self-pay | Admitting: *Deleted

## 2016-04-14 DIAGNOSIS — J069 Acute upper respiratory infection, unspecified: Secondary | ICD-10-CM

## 2016-04-14 MED ORDER — GUAIFENESIN ER 600 MG PO TB12: 600.0000 mg | ORAL_TABLET | Freq: Two times a day (BID) | ORAL | 0 refills | Status: AC

## 2016-04-14 MED ORDER — AMOXICILLIN-POT CLAVULANATE 875-125 MG PO TABS: 1.0000 | ORAL_TABLET | Freq: Two times a day (BID) | ORAL | 0 refills | Status: AC

## 2016-05-01 ENCOUNTER — Other Ambulatory Visit: Payer: Self-pay | Admitting: Internal Medicine

## 2016-05-01 ENCOUNTER — Non-Acute Institutional Stay: Payer: Medicare Other | Admitting: Nurse Practitioner

## 2016-05-01 ENCOUNTER — Encounter: Payer: Self-pay | Admitting: Nurse Practitioner

## 2016-05-01 DIAGNOSIS — J069 Acute upper respiratory infection, unspecified: Secondary | ICD-10-CM

## 2016-05-01 DIAGNOSIS — F418 Other specified anxiety disorders: Secondary | ICD-10-CM | POA: Diagnosis not present

## 2016-05-01 DIAGNOSIS — E1142 Type 2 diabetes mellitus with diabetic polyneuropathy: Secondary | ICD-10-CM | POA: Diagnosis not present

## 2016-05-01 DIAGNOSIS — I10 Essential (primary) hypertension: Secondary | ICD-10-CM | POA: Diagnosis not present

## 2016-05-01 DIAGNOSIS — R413 Other amnesia: Secondary | ICD-10-CM

## 2016-05-01 DIAGNOSIS — K219 Gastro-esophageal reflux disease without esophagitis: Secondary | ICD-10-CM

## 2016-05-01 MED ORDER — MEMANTINE HCL 28 X 5 MG & 21 X 10 MG PO TABS: ORAL_TABLET | ORAL | 12 refills | Status: DC

## 2016-05-01 NOTE — Assessment & Plan Note (Signed)
Early am awake, c/o no close relatives, aches/pains wound get better, upset stomach, don't want take any medicine for it.

## 2016-05-01 NOTE — Assessment & Plan Note (Signed)
healed 

## 2016-05-01 NOTE — Assessment & Plan Note (Addendum)
MMSE 05/01/16 29/30, difficulty with clock drawing, told me she has numerous notes as reminders at home due to memory lapses.  Will start Namenda starter pk, then 10mg  bid.

## 2016-05-01 NOTE — Assessment & Plan Note (Signed)
On and off, GI 05/29/16

## 2016-05-01 NOTE — Progress Notes (Signed)
Patient ID: [name], female   DOB: [birth year], 82 y.o.   MRN: [id number]   Location:   [facility]   Place of Service:  [facility] [place]  Provider: Marlana Latus NP  Code Status: DNR  [place] of Care: [place]  Advanced Directives 05/01/2016  Does patient have an advance directive? Yes  Type of Paramedic of [place];Living will;Out of facility DNR (pink MOST or yellow form)  Does patient want to make changes to advanced directive? No - Patient declined  Copy of advanced directive(s) in chart? Yes  Would patient like information on creating an advanced directive? -     Chief Complaint  Patient presents with  . Medical Management of Chronic Issues    HPI: Patient is a 82 y.o. female seen today to evaluate chronic medical conditions.    Hx of hypertension, blood pressure is controlled on Losartan 50mg  daily.  C/o upset stomach on and off, off Omeprazole. GI consultation 05/29/16.  Also stated she is depressed but doesn't want take any medicine for it. Early am awake, but still feel rested. C/o aches and pains in the back, right hip, knees, said acupuncture didn't help, may Ortho when she desires. MMSE 05/01/16 29/30, difficulty with clock drawing, told me she has numerous notes as reminders  at home due to memory lapses.    Past Medical History:  Diagnosis Date  . Contact dermatitis and other eczema, due to unspecified cause   . Cortical senile cataract   . GERD (gastroesophageal reflux disease) 02/17/2013  . Hyperparathyroidism, unspecified 03/26/2010  . Irritable bowel syndrome   . Keratoconjunctivitis sicca, not specified as Sjogren's   . Loss of weight   . Macular degeneration (senile) of retina, unspecified   . Memory loss   . Other and unspecified hyperlipidemia   . Other malaise and fatigue 06/17/2010  . Pain in joint, site unspecified   . Pathologic fracture of vertebrae   . Reflux esophagitis   . Scoliosis (and kyphoscoliosis), idiopathic   . Senile  osteoporosis   . Type II or unspecified type diabetes mellitus without mention of complication, uncontrolled   . Unspecified essential hypertension   . Unspecified hereditary and idiopathic peripheral neuropathy   . Unspecified vitamin D deficiency     Past Surgical History:  Procedure Laterality Date  . APPENDECTOMY    . BREAST LUMPECTOMY  02/05/2010   Dr. Christie Beckers  . CATARACT EXTRACTION W/ INTRAOCULAR LENS  IMPLANT, BILATERAL  04/2013   Dr. Bing Plume  . CHOLECYSTECTOMY  2004   Dr. Zella Richer  . COLONOSCOPY  2001   Dr. Collene Mares  . fibroidectomy  1950  . [place]  . KYPHOPLASTY  2010   T10 & T12 Dr. Erline Levine  . MEDIAL PARTIAL KNEE REPLACEMENT Left 2004   Dr. Ronnie Derby  . SPINE SURGERY     vertebroplasty T10, T12  . SPINE SURGERY  11/02/2001    C4-5 & C5-6 Titanium Plate Placed Dr. Carloyn Manner  . THYROIDECTOMY, PARTIAL Left 2007  . TOTAL KNEE ARTHROPLASTY Right 2009   Dr. Alvan Dame     Allergies  Allergen Reactions  . Bactrim     unknown  . Betadine [Povidone Iodine]     unknown  . Metformin And Related     Hurt stomach  . Nutmeg Oil (Myristica Oil)     unknown  . Sulfa Antibiotics     unknown      Medication List       Accurate as  of 05/01/16  3:01 PM. Always use your most recent med list.          ICAPS Tabs Take 1 tablet by mouth daily.   losartan 50 MG tablet Commonly known as:  COZAAR TAKE 1 TABLET ONCE DAILY TO CONTROL BLOOD PRESSURE.   REFRESH 1.4-0.6 % ophthalmic solution Generic drug:  polyvinyl alcohol-povidone 1-2 drops as needed.   TRUE METRIX BLOOD GLUCOSE TEST test strip Generic drug:  glucose blood CHECK BLOOD SUGAR TWICE DAILY.   TRUEPLUS LANCETS 28G Misc by Does not apply route. Check blood sugar once every other day       Review of Systems:  Review of Systems  Constitutional: Negative for fever.  HENT: Positive for congestion and hearing loss.   Respiratory: Negative for cough and shortness of breath.   Cardiovascular:  Negative for chest pain, palpitations and leg swelling.  Gastrointestinal: Negative for abdominal pain, blood in stool, constipation, diarrhea, nausea and vomiting.       Epigastric annoying sensation  Genitourinary: Negative for dysuria, frequency, hematuria and urgency.       Upset stomach on and off  Musculoskeletal: Positive for arthralgias, back pain and myalgias.       Lower back, right hip, knees pain  Skin: Negative.   Neurological: Negative for dizziness, tremors and weakness.       Decreased vibratory sensation in both feet.  Psychiatric/Behavioral: Positive for dysphoric mood and sleep disturbance. Negative for hallucinations and suicidal ideas. The patient is not nervous/anxious.     Health Maintenance  Topic Date Due  . ZOSTAVAX  09/06/1987  . PNA vac Low Risk Adult (2 of 2 - PCV13) 06/30/2002  . TETANUS/TDAP  07/01/2011  . FOOT EXAM  12/21/2015  . INFLUENZA VACCINE  01/29/2016  . OPHTHALMOLOGY EXAM  04/02/2016  . HEMOGLOBIN A1C  08/14/2016  . DEXA SCAN  Completed    Physical Exam: Vitals:   05/01/16 1423  BP: 118/60  Pulse: 89  Resp: 18  Temp: 97.3 F (36.3 C)  TempSrc: Oral  SpO2: 98%  Weight: 109 lb 12.8 oz (49.8 kg)  Height: 5\' 1"  (1.549 m)   Body mass index is 20.75 kg/m. Physical Exam  Constitutional: She is oriented to person, place, and time. She appears well-developed and well-nourished. No distress.  HENT:  Head: Normocephalic and atraumatic.  Right Ear: External ear normal.  Left Ear: External ear normal.  Nose: Nose normal.  Mouth/Throat: Oropharynx is clear and moist.  Eyes: Conjunctivae and EOM are normal. Pupils are equal, round, and reactive to light.  Corrective lenses. Cataracts.  Neck: Normal range of motion. Neck supple. No JVD present. No tracheal deviation present. No thyromegaly present.  Cardiovascular: Normal rate, regular rhythm, normal heart sounds and intact distal pulses.  Exam reveals no gallop and no friction rub.   No  murmur heard. Pulmonary/Chest: Effort normal and breath sounds normal. No respiratory distress. She has no wheezes. She has no rales.  Abdominal: Soft. Bowel sounds are normal. She exhibits no distension and no mass. There is no tenderness.  Musculoskeletal: Normal range of motion. She exhibits no edema or tenderness.  Lymphadenopathy:    She has no cervical adenopathy.  Neurological: She is alert and oriented to person, place, and time. No cranial nerve deficit.  02/17/14 MMSE 29/30 06/13/14 MMSE 30/30. Passed clock drawing.  Skin: No rash noted. She is not diaphoretic. No erythema. No pallor.  Psychiatric: She has a normal mood and affect. Her behavior is normal. Thought content  normal.    Labs reviewed: Basic Metabolic Panel:  Recent Labs  06/12/15 02/12/16  NA 139 138  K 4.5 4.4  BUN 15 15  CREATININE 0.7 0.8  TSH  --  1.03   Liver Function Tests:  Recent Labs  02/12/16  AST 17  ALT 10  ALKPHOS 60   No results for input(s): LIPASE, AMYLASE in the last 8760 hours. No results for input(s): AMMONIA in the last 8760 hours. CBC:  Recent Labs  02/12/16  WBC 7.2  HGB 12.7  HCT 37  PLT 275   Lipid Panel: No results for input(s): CHOL, HDL, LDLCALC, TRIG, CHOLHDL, LDLDIRECT in the last 8760 hours. Lab Results  Component Value Date   HGBA1C 6.8 02/12/2016    Procedures since last visit: No results found.  Assessment/Plan Essential hypertension Controlled, continue Losartan 50mg  daily  Acute upper respiratory infection healed  GERD (gastroesophageal reflux disease) On and off, GI 05/29/16  DM type 2 with diabetic peripheral neuropathy Fasting CBG<126 per patient.   Memory loss MMSE 05/01/16 29/30, difficulty with clock drawing, told me she has numerous notes as reminders at home due to memory lapses.  Will start Namenda starter pk, then 10mg  bid.   Depression with anxiety Early am awake, c/o no close relatives, aches/pains wound get better, upset stomach,  don't want take any medicine for it.        Labs/tests ordered: none  Next appt:  Visit date not found

## 2016-05-01 NOTE — Assessment & Plan Note (Signed)
Controlled, continue Losartan 50mg daily 

## 2016-05-01 NOTE — Assessment & Plan Note (Signed)
Fasting CBG<126 per patient.

## 2016-05-29 ENCOUNTER — Other Ambulatory Visit (INDEPENDENT_AMBULATORY_CARE_PROVIDER_SITE_OTHER): Payer: Self-pay

## 2016-05-29 ENCOUNTER — Ambulatory Visit (INDEPENDENT_AMBULATORY_CARE_PROVIDER_SITE_OTHER): Payer: Medicare Other | Admitting: Gastroenterology

## 2016-05-29 ENCOUNTER — Encounter: Payer: Self-pay | Admitting: Gastroenterology

## 2016-05-29 VITALS — BP 168/68 | HR 80 | Ht 61.0 in | Wt 110.4 lb

## 2016-05-29 DIAGNOSIS — G8929 Other chronic pain: Secondary | ICD-10-CM | POA: Diagnosis not present

## 2016-05-29 DIAGNOSIS — R1013 Epigastric pain: Secondary | ICD-10-CM

## 2016-05-29 LAB — BUN: BUN: 18 mg/dL (ref 6–23)

## 2016-05-29 LAB — CREATININE, SERUM: Creatinine, Ser: 0.79 mg/dL (ref 0.40–1.20)

## 2016-05-29 NOTE — Progress Notes (Signed)
HPI :  82 y/o female with a history of dementia, hyperparathyroidism, and reported reflux, here for a new patient visit for abdominal pains.   She has had epigastric pain bothering her. She thinks on and off for several months, at least 6 months or so. The pain is a "nagging" and present mostly all of the time. Eating does not make any of her symptoms worse, but it does not make it any better. She denies nausea or vomiting. She denies any heartburn that bothers her. She denies any bowel habit problems. She denies any blood in the stools.  She thinks she had a colonoscopy several years ago, ? 2001. No prior EGD.  She reports stable weight, no weight loss. She is s/p cholecystectomy. She denies any advil / ibuprofen. She had a negative H pylori stool test in February.   She had normal LFTs and CBC in August, mild elevation of calcium, TSH normal.     Past Medical History:  Diagnosis Date  . Contact dermatitis and other eczema, due to unspecified cause   . Cortical senile cataract   . GERD (gastroesophageal reflux disease) 02/17/2013  . Hyperparathyroidism, unspecified 03/26/2010  . Irritable bowel syndrome   . Keratoconjunctivitis sicca, not specified as Sjogren's   . Loss of weight   . Macular degeneration (senile) of retina, unspecified   . Memory loss   . Other and unspecified hyperlipidemia   . Other malaise and fatigue 06/17/2010  . Pain in joint, site unspecified   . Pathologic fracture of vertebrae   . Reflux esophagitis   . Scoliosis (and kyphoscoliosis), idiopathic   . Senile osteoporosis   . Type II or unspecified type diabetes mellitus without mention of complication, uncontrolled   . Unspecified essential hypertension   . Unspecified hereditary and idiopathic peripheral neuropathy   . Unspecified vitamin D deficiency      Past Surgical History:  Procedure Laterality Date  . APPENDECTOMY    . BREAST LUMPECTOMY  02/05/2010   Dr. Christie Beckers  . CATARACT EXTRACTION W/  INTRAOCULAR LENS  IMPLANT, BILATERAL  04/2013   Dr. Bing Plume  . CHOLECYSTECTOMY  2004   Dr. Zella Richer  . COLONOSCOPY  2001   Dr. Collene Mares  . fibroidectomy  1950  . [place]  . KYPHOPLASTY  2010   T10 & T12 Dr. Erline Levine  . MEDIAL PARTIAL KNEE REPLACEMENT Left 2004   Dr. Ronnie Derby  . SPINE SURGERY     vertebroplasty T10, T12  . SPINE SURGERY  11/02/2001    C4-5 & C5-6 Titanium Plate Placed Dr. Carloyn Manner  . THYROIDECTOMY, PARTIAL Left 2007  . TOTAL KNEE ARTHROPLASTY Right 2009   Dr. Alvan Dame    Family History  Problem Relation Age of Onset  . Diabetes Mother   . Heart disease Mother     CHF  . Stroke Father   . Heart disease Sister     MI  . Colon cancer Neg Hx   . Stomach cancer Neg Hx   . Esophageal cancer Neg Hx   . Rectal cancer Neg Hx   . Liver cancer Neg Hx    Social History  Substance Use Topics  . Smoking status: Never Smoker  . Smokeless tobacco: Never Used  . Alcohol use No   Current Outpatient Prescriptions  Medication Sig Dispense Refill  . losartan (COZAAR) 50 MG tablet TAKE 1 TABLET ONCE DAILY TO CONTROL BLOOD PRESSURE. 60 tablet 0  . memantine (NAMENDA TITRATION PAK) tablet pack  5 mg/day for =1 week; 5 mg twice daily for =1 week; 15 mg/day given in 5 mg and 10 mg separated doses for =1 week; then 10 mg twice daily 49 tablet 12  . Multiple Vitamins-Minerals (ICAPS) TABS Take 1 tablet by mouth daily.    . polyvinyl alcohol-povidone (REFRESH) 1.4-0.6 % ophthalmic solution 1-2 drops as needed.    . TRUE METRIX BLOOD GLUCOSE TEST test strip CHECK BLOOD SUGAR TWICE DAILY. 50 each 5  . TRUEPLUS LANCETS 28G MISC by Does not apply route. Check blood sugar once every other day     No current facility-administered medications for this visit.    Allergies  Allergen Reactions  . Bactrim     unknown  . Betadine [Povidone Iodine]     unknown  . Metformin And Related     Hurt stomach  . Nutmeg Oil (Myristica Oil)     unknown  . Sulfa Antibiotics     unknown      Review of Systems: All systems reviewed and negative except where noted in HPI.   Lab Results  Component Value Date   WBC 7.2 02/12/2016   HGB 12.7 02/12/2016   HCT 37 02/12/2016   MCV 88.0 12/28/2013   PLT 275 02/12/2016    Lab Results  Component Value Date   ALT 10 02/12/2016   AST 17 02/12/2016   ALKPHOS 60 02/12/2016   BILITOT 0.7 12/28/2013    Lab Results  Component Value Date   CREATININE 0.79 05/29/2016   BUN 18 05/29/2016   NA 138 02/12/2016   K 4.4 02/12/2016   CL 102 12/28/2013   CO2 26 12/28/2013     Physical Exam: BP (!) 168/68   Pulse 80   Ht 5\' 1"  (1.549 m)   Wt 110 lb 6 oz (50.1 kg)   BMI 20.86 kg/m   Pulse 80 Constitutional: Pleasant,well-developed, female in no acute distress. HEENT: Normocephalic and atraumatic. Conjunctivae are normal. No scleral icterus. Neck supple.  Cardiovascular: Normal rate, regular rhythm.  Pulmonary/chest: Effort normal and breath sounds normal. No wheezing, rales or rhonchi. Abdominal: Soft, nondistended, epigastric TTP without rebound, numerous seborrheic keratosis along abdominal wall, Extremities: no edema Lymphadenopathy: No cervical adenopathy noted. Neurological: Alert and oriented to person place and time. Skin: Skin is warm and dry. No rashes noted. Psychiatric: Normal mood and affect. Behavior is normal.   ASSESSMENT AND PLAN: 82 year old female with medical history as outlined above, presenting with chronic epigastric pain for several months appears to be worsening over time. Labs are otherwise reassuring. Given her age and continuing symptoms, without any prior abdominal imaging in recent years, recommend CT scan of abdomen and pelvis to initially evaluate. Recommend trial of omeprazole 20 mg daily for a few weeks to see if this helps in case she has reflux or gastritis contributing although this seems a bit less likely, prior testing for H. pylori was negative. We will await results of CT scan and  she'll be contacted with results. She agreed.  Costilla Cellar, MD [facility] Pager [phone]  CC: Mast, Man X, NP

## 2016-05-29 NOTE — Patient Instructions (Signed)
If you are age 80 or older, your body mass index should be between 23-30. Your Body mass index is 20.86 kg/m. If this is out of the aforementioned range listed, please consider follow up with your Primary Care Provider.  If you are age 60 or younger, your body mass index should be between 19-25. Your Body mass index is 20.86 kg/m. If this is out of the aformentioned range listed, please consider follow up with your Primary Care Provider.   Your physician has requested that you go to the basement for the following lab work before leaving today:   BUN, Creatinine  You have been scheduled for a CT scan of the abdomen and pelvis at [facility] ([address]---this is in the same building as Press [facility]).   You are scheduled on  Monday December 4th at 12:30. You should arrive 15 minutes prior to your appointment time for registration. Please follow the written instructions below on the day of your exam:  WARNING: IF YOU ARE ALLERGIC TO IODINE/X-RAY DYE, PLEASE NOTIFY RADIOLOGY IMMEDIATELY AT [phone]! YOU WILL BE GIVEN A 13 HOUR PREMEDICATION PREP.  1) Do not eat or drink anything after 8:30am (4 hours prior to your test) 2) You have been given 2 bottles of oral contrast to drink. The solution may taste better if refrigerated, but do NOT add ice or any other liquid to this solution. Shake well before drinking.    Drink 1 bottle of contrast @ 10:30am (2 hours prior to your exam)  Drink 1 bottle of contrast @ 11:30am (1 hour prior to your exam)  You may take any medications as prescribed with a small amount of water except for the following: Metformin, Glucophage, Glucovance, Avandamet, Riomet, Fortamet, Actoplus Met, Janumet, Glumetza or Metaglip. The above medications must be held the day of the exam AND 48 hours after the exam.  The purpose of you drinking the oral contrast is to aid in the visualization of your intestinal tract. The contrast solution may cause some  diarrhea. Before your exam is started, you will be given a small amount of fluid to drink. Depending on your individual set of symptoms, you may also receive an intravenous injection of x-ray contrast/dye. Plan on being at [facility] for 30 minutes or longer, depending on the type of exam you are having performed.  This test typically takes 30-45 minutes to complete.  If you have any questions regarding your exam or if you need to reschedule, you may call the CT department at [phone] between the hours of 8:00 am and 5:00 pm, Monday-Friday.  Please continue Omeprazole '20mg'$  once daily.  Thank you.  ________________________________________________________________________

## 2016-06-02 ENCOUNTER — Ambulatory Visit (HOSPITAL_COMMUNITY): Payer: Medicare Other

## 2016-06-03 ENCOUNTER — Ambulatory Visit (HOSPITAL_COMMUNITY)
Admission: RE | Admit: 2016-06-03 | Discharge: 2016-06-03 | Disposition: A | Discharge status: Home / Self Care | Payer: Medicare Other | Source: Ambulatory Visit | Attending: Gastroenterology | Admitting: Gastroenterology

## 2016-06-03 ENCOUNTER — Encounter (HOSPITAL_COMMUNITY): Payer: Self-pay

## 2016-06-03 DIAGNOSIS — I7090 Unspecified atherosclerosis: Secondary | ICD-10-CM | POA: Insufficient documentation

## 2016-06-03 DIAGNOSIS — I2510 Atherosclerotic heart disease of native coronary artery without angina pectoris: Secondary | ICD-10-CM | POA: Insufficient documentation

## 2016-06-03 DIAGNOSIS — K5730 Diverticulosis of large intestine without perforation or abscess without bleeding: Secondary | ICD-10-CM | POA: Insufficient documentation

## 2016-06-03 DIAGNOSIS — M4854XA Collapsed vertebra, not elsewhere classified, thoracic region, initial encounter for fracture: Secondary | ICD-10-CM | POA: Diagnosis not present

## 2016-06-03 DIAGNOSIS — R1013 Epigastric pain: Secondary | ICD-10-CM | POA: Diagnosis not present

## 2016-06-03 DIAGNOSIS — D252 Subserosal leiomyoma of uterus: Secondary | ICD-10-CM | POA: Insufficient documentation

## 2016-06-03 DIAGNOSIS — R109 Unspecified abdominal pain: Secondary | ICD-10-CM | POA: Diagnosis not present

## 2016-06-03 DIAGNOSIS — G8929 Other chronic pain: Secondary | ICD-10-CM | POA: Diagnosis not present

## 2016-07-03 ENCOUNTER — Other Ambulatory Visit: Payer: Self-pay | Admitting: Internal Medicine

## 2016-07-15 ENCOUNTER — Other Ambulatory Visit: Payer: Self-pay | Admitting: Internal Medicine

## 2016-07-24 ENCOUNTER — Encounter: Payer: Self-pay | Admitting: Nurse Practitioner

## 2016-07-24 ENCOUNTER — Non-Acute Institutional Stay: Payer: Medicare Other | Admitting: Nurse Practitioner

## 2016-07-24 DIAGNOSIS — M25551 Pain in right hip: Secondary | ICD-10-CM | POA: Diagnosis not present

## 2016-07-24 DIAGNOSIS — I10 Essential (primary) hypertension: Secondary | ICD-10-CM | POA: Diagnosis not present

## 2016-07-24 DIAGNOSIS — M5440 Lumbago with sciatica, unspecified side: Secondary | ICD-10-CM

## 2016-07-24 DIAGNOSIS — K219 Gastro-esophageal reflux disease without esophagitis: Secondary | ICD-10-CM | POA: Diagnosis not present

## 2016-07-24 DIAGNOSIS — F418 Other specified anxiety disorders: Secondary | ICD-10-CM | POA: Diagnosis not present

## 2016-07-24 NOTE — Progress Notes (Signed)
Patient ID: [name], female   DOB: 1925-12-25, 83 y.o.   MRN: [id number]   Location:   [facility]   Place of Service:  Clinic [facility]  Provider: Marlana Latus NP  Code Status: DNR  Goals of Care: [place]  Advanced Directives 07/24/2016  Does Patient Have a Medical Advance Directive? No  Type of Advance Directive -  Does patient want to make changes to medical advance directive? -  Copy of [place] in Chart? -  Would patient like information on creating a medical advance directive? -     Chief Complaint  Patient presents with  . Acute Visit    (R) hip painx 2 weeks across right buttock    HPI: Patient is a 83 y.o. female seen today to evaluate chronic medical conditions.    Hx of hypertension, blood pressure is controlled on Losartan 50mg  daily.  C/o upset stomach on and off, off Omeprazole. GI consultation 05/29/16.  Also stated she is depressed but doesn't want take any medicine for it. Early am awake, but still feel rested. C/o aches and pains in the back, right hip, knees, said acupuncture didn't help, may Ortho when she desires. MMSE 05/01/16 29/30, difficulty with clock drawing, told me she has numerous notes as reminders  at home due to memory lapses.    Past Medical History:  Diagnosis Date  . Contact dermatitis and other eczema, due to unspecified cause   . Cortical senile cataract   . GERD (gastroesophageal reflux disease) 02/17/2013  . Hyperparathyroidism, unspecified 03/26/2010  . Irritable bowel syndrome   . Keratoconjunctivitis sicca, not specified as Sjogren's   . Loss of weight   . Macular degeneration (senile) of retina, unspecified   . Memory loss   . Other and unspecified hyperlipidemia   . Other malaise and fatigue 06/17/2010  . Pain in joint, site unspecified   . Pathologic fracture of vertebrae   . Reflux esophagitis   . Scoliosis (and kyphoscoliosis), idiopathic   . Senile osteoporosis   . Type II or unspecified type diabetes mellitus without  mention of complication, uncontrolled   . Unspecified essential hypertension   . Unspecified hereditary and idiopathic peripheral neuropathy   . Unspecified vitamin D deficiency     Past Surgical History:  Procedure Laterality Date  . APPENDECTOMY    . BREAST LUMPECTOMY  02/05/2010   Dr. Christie Beckers  . CATARACT EXTRACTION W/ INTRAOCULAR LENS  IMPLANT, BILATERAL  04/2013   Dr. Bing Plume  . CHOLECYSTECTOMY  2004   Dr. Zella Richer  . COLONOSCOPY  2001   Dr. Collene Mares  . fibroidectomy  1950  . [place]  . KYPHOPLASTY  2010   T10 & T12 Dr. Erline Levine  . MEDIAL PARTIAL KNEE REPLACEMENT Left 2004   Dr. Ronnie Derby  . SPINE SURGERY     vertebroplasty T10, T12  . SPINE SURGERY  11/02/2001    C4-5 & C5-6 Titanium Plate Placed Dr. Carloyn Manner  . THYROIDECTOMY, PARTIAL Left 2007  . TOTAL KNEE ARTHROPLASTY Right 2009   Dr. Alvan Dame     Allergies  Allergen Reactions  . Bactrim     unknown  . Betadine [Povidone Iodine]     unknown  . Metformin And Related     Hurt stomach  . Nutmeg Oil (Myristica Oil)     unknown  . Sulfa Antibiotics     unknown    Allergies as of 07/24/2016      Reactions   Bactrim  unknown   Betadine [povidone Iodine]    unknown   Metformin And Related    Hurt stomach   Nutmeg Oil (myristica Oil)    unknown   Sulfa Antibiotics    unknown      Medication List       Accurate as of 07/24/16  3:14 PM. Always use your most recent med list.          ICAPS Tabs Take 1 tablet by mouth daily.   losartan 50 MG tablet Commonly known as:  COZAAR TAKE 1 TABLET ONCE DAILY TO CONTROL BLOOD PRESSURE.   REFRESH 1.4-0.6 % ophthalmic solution Generic drug:  polyvinyl alcohol-povidone 1-2 drops as needed.   TRUE METRIX BLOOD GLUCOSE TEST test strip Generic drug:  glucose blood CHECK BLOOD SUGAR TWICE DAILY.   TRUEPLUS LANCETS 28G Misc CHECK BLOOD SUGAR TWICE DAILY.       Review of Systems:  Review of Systems  Constitutional: Negative for fever.  HENT:  Positive for hearing loss. Negative for congestion.   Respiratory: Negative for cough and shortness of breath.   Cardiovascular: Negative for chest pain, palpitations and leg swelling.  Gastrointestinal: Negative for abdominal pain, blood in stool, constipation, diarrhea, nausea and vomiting.       Epigastric annoying sensation  Genitourinary: Negative for dysuria, frequency, hematuria and urgency.       Upset stomach on and off  Musculoskeletal: Positive for arthralgias, back pain and myalgias.       Lower back, right hip, knees pain  Skin: Negative.   Neurological: Negative for dizziness, tremors and weakness.       Decreased vibratory sensation in both feet.  Psychiatric/Behavioral: Positive for dysphoric mood and sleep disturbance. Negative for hallucinations and suicidal ideas. The patient is not nervous/anxious.     Health Maintenance  Topic Date Due  . ZOSTAVAX  09/06/1987  . PNA vac Low Risk Adult (2 of 2 - PCV13) 06/30/2002  . TETANUS/TDAP  07/01/2011  . FOOT EXAM  12/21/2015  . INFLUENZA VACCINE  01/29/2016  . OPHTHALMOLOGY EXAM  04/02/2016  . HEMOGLOBIN A1C  08/14/2016  . DEXA SCAN  Completed    Physical Exam: Vitals:   07/24/16 1429  BP: (!) 140/57  Pulse: 88  Resp: 20  Temp: 97.7 F (36.5 C)  TempSrc: Oral  SpO2: 98%  Weight: 114 lb (51.7 kg)  Height: 5\' 1"  (1.549 m)   Body mass index is 21.54 kg/m. Physical Exam  Constitutional: She is oriented to person, place, and time. She appears well-developed and well-nourished. No distress.  HENT:  Head: Normocephalic and atraumatic.  Right Ear: External ear normal.  Left Ear: External ear normal.  Nose: Nose normal.  Mouth/Throat: Oropharynx is clear and moist.  Eyes: Conjunctivae and EOM are normal. Pupils are equal, round, and reactive to light.  Corrective lenses. Cataracts.  Neck: Normal range of motion. Neck supple. No JVD present. No tracheal deviation present. No thyromegaly present.  Cardiovascular:  Normal rate, regular rhythm, normal heart sounds and intact distal pulses.  Exam reveals no gallop and no friction rub.   No murmur heard. Pulmonary/Chest: Effort normal and breath sounds normal. No respiratory distress. She has no wheezes. She has no rales.  Abdominal: Soft. Bowel sounds are normal. She exhibits no distension and no mass. There is no tenderness.  Musculoskeletal: Normal range of motion. She exhibits tenderness. She exhibits no edema.  Right hip pain  Lymphadenopathy:    She has no cervical adenopathy.  Neurological: She  is alert and oriented to person, place, and time. No cranial nerve deficit.  02/17/14 MMSE 29/30 06/13/14 MMSE 30/30. Passed clock drawing.  Skin: No rash noted. She is not diaphoretic. No erythema. No pallor.  Psychiatric: She has a normal mood and affect. Her behavior is normal. Thought content normal.    Labs reviewed: Basic Metabolic Panel:  Recent Labs  02/12/16 05/29/16 1452  NA 138  --   K 4.4  --   BUN 15 18  CREATININE 0.8 0.79  TSH 1.03  --    Liver Function Tests:  Recent Labs  02/12/16  AST 17  ALT 10  ALKPHOS 60   No results for input(s): LIPASE, AMYLASE in the last 8760 hours. No results for input(s): AMMONIA in the last 8760 hours. CBC:  Recent Labs  02/12/16  WBC 7.2  HGB 12.7  HCT 37  PLT 275   Lipid Panel: No results for input(s): CHOL, HDL, LDLCALC, TRIG, CHOLHDL, LDLDIRECT in the last 8760 hours. Lab Results  Component Value Date   HGBA1C 6.8 02/12/2016    Procedures since last visit: No results found.  Assessment/Plan Lumbago 08/29/15 X-ray T+L spine: Thoracolumbar spine scoliosis and degenerative change. Prior thoracic and lumbar vertebroplasties again noted. No acute abnormality identified.  2. Aortoiliac atherosclerotic vascular disease. Calcified pelvic Fibroid.  S/p ortho consult, PT. Acupuncture  Lower back pain, right hip pain, knees pain, still ambulates with a cane.   Refer to spine  specialist.    Depression with anxiety Early am awake, c/o no close relatives, aches/pains wound get better, upset stomach, don't want take any medicine for it.      Essential hypertension Controlled, continue Losartan 50mg  daily  GERD (gastroesophageal reflux disease) On and off, GI 05/29/16  Right hip pain Right hip pain, Ortho consult.     Labs/tests ordered: none  Next appt:  Visit date not found

## 2016-07-24 NOTE — Assessment & Plan Note (Signed)
On and off, GI 05/29/16

## 2016-07-24 NOTE — Assessment & Plan Note (Signed)
Early am awake, c/o no close relatives, aches/pains wound get better, upset stomach, don't want take any medicine for it.

## 2016-07-24 NOTE — Assessment & Plan Note (Signed)
Controlled, continue Losartan 50mg daily 

## 2016-07-24 NOTE — Assessment & Plan Note (Signed)
Right hip pain, Ortho consult.

## 2016-07-24 NOTE — Assessment & Plan Note (Signed)
08/29/15 X-ray T+L spine: Thoracolumbar spine scoliosis and degenerative change. Prior thoracic and lumbar vertebroplasties again noted. No acute abnormality identified.  2. Aortoiliac atherosclerotic vascular disease. Calcified pelvic Fibroid.  S/p ortho consult, PT. Acupuncture  Lower back pain, right hip pain, knees pain, still ambulates with a cane.   Refer to spine specialist.

## 2016-08-19 DIAGNOSIS — L309 Dermatitis, unspecified: Secondary | ICD-10-CM | POA: Diagnosis not present

## 2016-08-19 DIAGNOSIS — L602 Onychogryphosis: Secondary | ICD-10-CM | POA: Diagnosis not present

## 2016-08-19 DIAGNOSIS — E119 Type 2 diabetes mellitus without complications: Secondary | ICD-10-CM | POA: Diagnosis not present

## 2016-09-04 ENCOUNTER — Encounter: Payer: Self-pay | Admitting: Nurse Practitioner

## 2016-09-04 ENCOUNTER — Non-Acute Institutional Stay: Payer: Medicare Other | Admitting: Nurse Practitioner

## 2016-09-04 DIAGNOSIS — I10 Essential (primary) hypertension: Secondary | ICD-10-CM

## 2016-09-04 DIAGNOSIS — R413 Other amnesia: Secondary | ICD-10-CM

## 2016-09-04 NOTE — Assessment & Plan Note (Addendum)
MMSE 05/01/16 29/30, difficulty with clock drawing, told me she has numerous notes as reminders at home due to memory lapses.  Will delay Namenda starter pk, then 10mg  bid.

## 2016-09-04 NOTE — Progress Notes (Signed)
Patient ID: [name], female   DOB: Nov 09, 1925, 83 y.o.   MRN: [id number]   Location:   [facility]   Place of Service:  [facility]inic [facility]  Provider: Marlana Latus NP  Code Status: DNR  Goals of Care: [place]  Advanced Directives 09/04/2016  Does Patient Have a Medical Advance Directive? Yes  Type of Paramedic of [place];Living will  Does patient want to make changes to medical advance directive? No - Patient declined  Copy of [place] in Chart? -  Would patient like information on creating a medical advance directive? -     Chief Complaint  Patient presents with  . Medical Management of Chronic Issues    HPI: Patient is a 83 y.o. female seen today to evaluate chronic medical conditions.    Hx of hypertension, blood pressure is controlled on Losartan 50mg  daily. C/o upset stomach on and off, off Omeprazole. GI consultation 05/29/16.  Also stated she is depressed but doesn't want take any medicine for it. Early am awake, but still feel rested. C/o aches and pains in the back, right hip, knees, said acupuncture didn't help, may Ortho when she desires. MMSE 05/01/16 29/30, difficulty with clock drawing, told me she has numerous notes as reminders  at home due to memory lapses. The patient desires delay staring Namenda.   Past Medical History:  Diagnosis Date  . Contact dermatitis and other eczema, due to unspecified cause   . Cortical senile cataract   . GERD (gastroesophageal reflux disease) 02/17/2013  . Hyperparathyroidism, unspecified 03/26/2010  . Irritable bowel syndrome   . Keratoconjunctivitis sicca, not specified as Sjogren's   . Loss of weight   . Macular degeneration (senile) of retina, unspecified   . Memory loss   . Other and unspecified hyperlipidemia   . Other malaise and fatigue 06/17/2010  . Pain in joint, site unspecified   . Pathologic fracture of vertebrae   . Reflux esophagitis   . Scoliosis (and kyphoscoliosis), idiopathic   .  Senile osteoporosis   . Type II or unspecified type diabetes mellitus without mention of complication, uncontrolled   . Unspecified essential hypertension   . Unspecified hereditary and idiopathic peripheral neuropathy   . Unspecified vitamin D deficiency     Past Surgical History:  Procedure Laterality Date  . APPENDECTOMY    . BREAST LUMPECTOMY  02/05/2010   Dr. Christie Beckers  . CATARACT EXTRACTION W/ INTRAOCULAR LENS  IMPLANT, BILATERAL  04/2013   Dr. Bing Plume  . CHOLECYSTECTOMY  2004   Dr. Zella Richer  . COLONOSCOPY  2001   Dr. Collene Mares  . fibroidectomy  1950  . [place]  . KYPHOPLASTY  2010   T10 & T12 Dr. Erline Levine  . MEDIAL PARTIAL KNEE REPLACEMENT Left 2004   Dr. Ronnie Derby  . SPINE SURGERY     vertebroplasty T10, T12  . SPINE SURGERY  11/02/2001    C4-5 & C5-6 Titanium Plate Placed Dr. Carloyn Manner  . THYROIDECTOMY, PARTIAL Left 2007  . TOTAL KNEE ARTHROPLASTY Right 2009   Dr. Alvan Dame     Allergies  Allergen Reactions  . Bactrim     unknown  . Betadine [Povidone Iodine]     unknown  . Metformin And Related     Hurt stomach  . Nutmeg Oil (Myristica Oil)     unknown  . Sulfa Antibiotics     unknown    Allergies as of 09/04/2016      Reactions  Bactrim    unknown   Betadine [povidone Iodine]    unknown   Metformin And Related    Hurt stomach   Nutmeg Oil (myristica Oil)    unknown   Sulfa Antibiotics    unknown      Medication List       Accurate as of 09/04/16  2:30 PM. Always use your most recent med list.          ICAPS Tabs Take 1 tablet by mouth daily.   losartan 50 MG tablet Commonly known as:  COZAAR TAKE 1 TABLET ONCE DAILY TO CONTROL BLOOD PRESSURE.   REFRESH 1.4-0.6 % ophthalmic solution Generic drug:  polyvinyl alcohol-povidone 1-2 drops as needed.   TRUE METRIX BLOOD GLUCOSE TEST test strip Generic drug:  glucose blood CHECK BLOOD SUGAR TWICE DAILY.   TRUEPLUS LANCETS 28G Misc CHECK BLOOD SUGAR TWICE DAILY.       Review of  Systems:  Review of Systems  Constitutional: Negative for fever.  HENT: Positive for hearing loss. Negative for congestion.   Respiratory: Negative for cough and shortness of breath.   Cardiovascular: Negative for chest pain, palpitations and leg swelling.  Gastrointestinal: Negative for abdominal pain, blood in stool, constipation, diarrhea, nausea and vomiting.       Epigastric annoying sensation  Genitourinary: Negative for dysuria, frequency, hematuria and urgency.       Upset stomach on and off  Musculoskeletal: Positive for arthralgias, back pain and myalgias.       Lower back, right hip, knees pain  Skin: Negative.   Neurological: Negative for dizziness, tremors and weakness.       Decreased vibratory sensation in both feet.  Psychiatric/Behavioral: Positive for dysphoric mood and sleep disturbance. Negative for hallucinations and suicidal ideas. The patient is not nervous/anxious.     Health Maintenance  Topic Date Due  . PNA vac Low Risk Adult (2 of 2 - PCV13) 06/30/2002  . TETANUS/TDAP  07/01/2011  . FOOT EXAM  12/21/2015  . INFLUENZA VACCINE  01/29/2016  . OPHTHALMOLOGY EXAM  04/02/2016  . HEMOGLOBIN A1C  08/14/2016  . DEXA SCAN  Completed    Physical Exam: Vitals:   09/04/16 1356  BP: 138/70  Pulse: 92  Resp: 20  Temp: 98 F (36.7 C)  Weight: 116 lb 6.4 oz (52.8 kg)  Height: 5\' 1"  (1.549 m)   Body mass index is 21.99 kg/m. Physical Exam  Constitutional: She is oriented to person, place, and time. She appears well-developed and well-nourished. No distress.  HENT:  Head: Normocephalic and atraumatic.  Right Ear: External ear normal.  Left Ear: External ear normal.  Nose: Nose normal.  Mouth/Throat: Oropharynx is clear and moist.  Eyes: Conjunctivae and EOM are normal. Pupils are equal, round, and reactive to light.  Corrective lenses. Cataracts.  Neck: Normal range of motion. Neck supple. No JVD present. No tracheal deviation present. No thyromegaly  present.  Cardiovascular: Normal rate, regular rhythm, normal heart sounds and intact distal pulses.  Exam reveals no gallop and no friction rub.   No murmur heard. Pulmonary/Chest: Effort normal and breath sounds normal. No respiratory distress. She has no wheezes. She has no rales.  Abdominal: Soft. Bowel sounds are normal. She exhibits no distension and no mass. There is no tenderness.  Musculoskeletal: Normal range of motion. She exhibits tenderness. She exhibits no edema.  Right hip pain  Lymphadenopathy:    She has no cervical adenopathy.  Neurological: She is alert and oriented to person,  place, and time. No cranial nerve deficit.  02/17/14 MMSE 29/30 06/13/14 MMSE 30/30. Passed clock drawing.  Skin: No rash noted. She is not diaphoretic. No erythema. No pallor.  Psychiatric: She has a normal mood and affect. Her behavior is normal. Thought content normal.    Labs reviewed: Basic Metabolic Panel:  Recent Labs  02/12/16 05/29/16 1452  NA 138  --   K 4.4  --   BUN 15 18  CREATININE 0.8 0.79  TSH 1.03  --    Liver Function Tests:  Recent Labs  02/12/16  AST 17  ALT 10  ALKPHOS 60   No results for input(s): LIPASE, AMYLASE in the last 8760 hours. No results for input(s): AMMONIA in the last 8760 hours. CBC:  Recent Labs  02/12/16  WBC 7.2  HGB 12.7  HCT 37  PLT 275   Lipid Panel: No results for input(s): CHOL, HDL, LDLCALC, TRIG, CHOLHDL, LDLDIRECT in the last 8760 hours. Lab Results  Component Value Date   HGBA1C 6.8 02/12/2016    Procedures since last visit: No results found.  Assessment/Plan Memory loss MMSE 05/01/16 29/30, difficulty with clock drawing, told me she has numerous notes as reminders at home due to memory lapses.  Will delay Namenda starter pk, then 10mg  bid.    Essential hypertension Controlled, continue Losartan 50mg  daily    Labs/tests ordered: none  Next appt:  6 months.

## 2016-09-04 NOTE — Assessment & Plan Note (Signed)
Controlled, continue Losartan 50mg daily 

## 2016-12-02 ENCOUNTER — Other Ambulatory Visit: Payer: Self-pay | Admitting: Internal Medicine

## 2017-01-09 ENCOUNTER — Other Ambulatory Visit: Payer: Self-pay | Admitting: Nurse Practitioner

## 2017-01-29 ENCOUNTER — Encounter: Payer: Self-pay | Admitting: Nurse Practitioner

## 2017-01-29 ENCOUNTER — Non-Acute Institutional Stay: Payer: Medicare Other | Admitting: Nurse Practitioner

## 2017-01-29 DIAGNOSIS — I10 Essential (primary) hypertension: Secondary | ICD-10-CM | POA: Diagnosis not present

## 2017-01-29 DIAGNOSIS — E1142 Type 2 diabetes mellitus with diabetic polyneuropathy: Secondary | ICD-10-CM | POA: Diagnosis not present

## 2017-01-29 DIAGNOSIS — E559 Vitamin D deficiency, unspecified: Secondary | ICD-10-CM

## 2017-01-29 DIAGNOSIS — K219 Gastro-esophageal reflux disease without esophagitis: Secondary | ICD-10-CM | POA: Diagnosis not present

## 2017-01-29 DIAGNOSIS — M5440 Lumbago with sciatica, unspecified side: Secondary | ICD-10-CM | POA: Diagnosis not present

## 2017-01-29 NOTE — Patient Instructions (Addendum)
F/u 6 months with Dr. Bubba Camp, 8:30 am 08/04/2017. Next Tuesday CBC CMP lipid panel Hgb a1c TSH Vit D, Orthopedic referral for back pain.

## 2017-01-29 NOTE — Assessment & Plan Note (Signed)
Diet controlled, update Hgb A1c, TSH

## 2017-01-29 NOTE — Progress Notes (Signed)
Patient ID: [name], female   DOB: February 12, 1926, 83 y.o.   MRN: [id number]   Location:   [facility]   Place of Service:  Clinic [facility]  Provider: Marlana Latus NP  Code Status: DNR  Goals of Care: [place]  Advanced Directives 01/29/2017  Does Patient Have a Medical Advance Directive? Yes  Type of Paramedic of [place];Living will  Does patient want to make changes to medical advance directive? No - Patient declined  Copy of [place] in Chart? Yes  Would patient like information on creating a medical advance directive? -     Chief Complaint  Patient presents with  . Acute Visit    alot of back and (rt) hip pain    HPI: Patient is a 83 y.o. female seen today to evaluate the right lower back pain about a few weeks, gradual onse, positional, she is comfortable when lying down, worse when she is up in sitting or walking, denied injury, has not tried any OTC pain meds yet, desires an referral to Dr. Aurea Graff practice.    Hx of hypertension, blood pressure is controlled on Losartan 50mg  daily. C/o upset stomach on and off, off Omeprazole. GI consultation 05/29/16.  Hx of scoliosis and back pain. Hx of T2DM, diet controlled.   Past Medical History:  Diagnosis Date  . Contact dermatitis and other eczema, due to unspecified cause   . Cortical senile cataract   . GERD (gastroesophageal reflux disease) 02/17/2013  . Hyperparathyroidism, unspecified ([place]) 03/26/2010  . Irritable bowel syndrome   . Keratoconjunctivitis sicca, not specified as Sjogren's   . Loss of weight   . Macular degeneration (senile) of retina, unspecified   . Memory loss   . Other and unspecified hyperlipidemia   . Other malaise and fatigue 06/17/2010  . Pain in joint, site unspecified   . Pathologic fracture of vertebrae   . Reflux esophagitis   . Scoliosis (and kyphoscoliosis), idiopathic   . Senile osteoporosis   . Type II or unspecified type diabetes mellitus without mention of  complication, uncontrolled   . Unspecified essential hypertension   . Unspecified hereditary and idiopathic peripheral neuropathy   . Unspecified vitamin D deficiency     Past Surgical History:  Procedure Laterality Date  . APPENDECTOMY    . BREAST LUMPECTOMY  02/05/2010   Dr. Christie Beckers  . CATARACT EXTRACTION W/ INTRAOCULAR LENS  IMPLANT, BILATERAL  04/2013   Dr. Bing Plume  . CHOLECYSTECTOMY  2004   Dr. Zella Richer  . COLONOSCOPY  2001   Dr. Collene Mares  . fibroidectomy  1950  . [place]  . KYPHOPLASTY  2010   T10 & T12 Dr. Erline Levine  . MEDIAL PARTIAL KNEE REPLACEMENT Left 2004   Dr. Ronnie Derby  . SPINE SURGERY     vertebroplasty T10, T12  . SPINE SURGERY  11/02/2001    C4-5 & C5-6 Titanium Plate Placed Dr. Carloyn Manner  . THYROIDECTOMY, PARTIAL Left 2007  . TOTAL KNEE ARTHROPLASTY Right 2009   Dr. Alvan Dame     Allergies  Allergen Reactions  . Bactrim     unknown  . Betadine [Povidone Iodine]     unknown  . Metformin And Related     Hurt stomach  . Nutmeg Oil (Myristica Oil)     unknown  . Sulfa Antibiotics     unknown    Allergies as of 01/29/2017      Reactions   Bactrim    unknown  Betadine [povidone Iodine]    unknown   Metformin And Related    Hurt stomach   Nutmeg Oil (myristica Oil)    unknown   Sulfa Antibiotics    unknown      Medication List       Accurate as of 01/29/17 11:59 PM. Always use your most recent med list.          glucose blood test strip Commonly known as:  TRUE METRIX BLOOD GLUCOSE TEST Use to check sugar twice a day. Dx:E11.42   ICAPS Tabs Take 1 tablet by mouth daily.   losartan 50 MG tablet Commonly known as:  COZAAR TAKE 1 TABLET ONCE DAILY TO CONTROL BLOOD PRESSURE.   omeprazole 20 MG capsule Commonly known as:  PRILOSEC TAKE 1 CAPSULE DAILY FOR STOMACH.   omeprazole 20 MG capsule Commonly known as:  PRILOSEC Take 1 capsule (20 mg total) by mouth daily.   REFRESH 1.4-0.6 % ophthalmic solution Generic drug:   polyvinyl alcohol-povidone 1-2 drops as needed.   TRUEPLUS LANCETS 28G Misc CHECK BLOOD SUGAR TWICE DAILY.       Review of Systems:  Review of Systems  Constitutional: Negative for activity change and appetite change.  HENT: Positive for hearing loss.   Respiratory: Negative for cough and shortness of breath.   Cardiovascular: Negative for chest pain, palpitations and leg swelling.  Gastrointestinal: Positive for abdominal pain. Negative for blood in stool, constipation and diarrhea.       Epigastric annoying sensation  Genitourinary: Negative for dysuria, frequency and urgency.       Upset stomach on and off  Musculoskeletal: Positive for arthralgias, back pain and gait problem. Negative for myalgias.       Lower back, right hip, knees pain, walking with a cane.   Skin: Negative.   Neurological: Negative for dizziness, numbness and headaches.       Decreased vibratory sensation in both feet.  Psychiatric/Behavioral: Negative for behavioral problems and confusion. The patient is not nervous/anxious.     Health Maintenance  Topic Date Due  . PNA vac Low Risk Adult (2 of 2 - PCV13) 06/30/2002  . TETANUS/TDAP  07/01/2011  . FOOT EXAM  12/21/2015  . OPHTHALMOLOGY EXAM  04/02/2016  . HEMOGLOBIN A1C  08/14/2016  . INFLUENZA VACCINE  01/28/2017  . DEXA SCAN  Completed    Physical Exam: Vitals:   01/29/17 1345  BP: (!) 140/58  Pulse: 85  Resp: 20  Temp: 98 F (36.7 C)  SpO2: 98%  Weight: 116 lb (52.6 kg)  Height: 5\' 1"  (1.549 m)   Body mass index is 21.92 kg/m. Physical Exam  Constitutional: She is oriented to person, place, and time. She appears well-developed and well-nourished.  HENT:  Head: Normocephalic and atraumatic.  Eyes: Pupils are equal, round, and reactive to light. Conjunctivae and EOM are normal.  Corrective lenses. Cataracts.  Neck: Normal range of motion. Neck supple.  Cardiovascular: Normal rate, regular rhythm and normal heart sounds.   No murmur  heard. Pulmonary/Chest: Effort normal and breath sounds normal. No respiratory distress. She has no rales.  Abdominal: Soft. Bowel sounds are normal. She exhibits no distension. There is no tenderness.  Musculoskeletal: Normal range of motion. She exhibits tenderness. She exhibits no edema.  Right lower back/SIJ pain.   Neurological: She is alert and oriented to person, place, and time. No cranial nerve deficit.  02/17/14 MMSE 29/30 06/13/14 MMSE 30/30. Passed clock drawing.  Skin: No rash noted. No erythema. No  pallor.  Psychiatric: She has a normal mood and affect. Her behavior is normal. Thought content normal.    Labs reviewed: Basic Metabolic Panel:  Recent Labs  02/12/16 05/29/16 1452  NA 138  --   K 4.4  --   BUN 15 18  CREATININE 0.8 0.79  TSH 1.03  --    Liver Function Tests:  Recent Labs  02/12/16  AST 17  ALT 10  ALKPHOS 60   No results for input(s): LIPASE, AMYLASE in the last 8760 hours. No results for input(s): AMMONIA in the last 8760 hours. CBC:  Recent Labs  02/12/16  WBC 7.2  HGB 12.7  HCT 37  PLT 275   Lipid Panel: No results for input(s): CHOL, HDL, LDLCALC, TRIG, CHOLHDL, LDLDIRECT in the last 8760 hours. Lab Results  Component Value Date   HGBA1C 6.8 02/12/2016    Procedures since last visit: No results found.  Assessment/Plan Essential hypertension Controlled, continue Losartan 50mg  daily, update CMP, lipid panel  GERD (gastroesophageal reflux disease) Complaining heart burns, continue Omeprazole 20mg  po daily, update CBC  DM type 2 with diabetic peripheral neuropathy Diet controlled, update Hgb A1c, TSH  Lumbago Pain in the right lower back/SIJ area, she feels comfortable when lying down, otherwise painful, desires to see Orthopedic for evaluation.   Vitamin D deficiency Will update Vit D level.     Labs/tests ordered: CBC CMP lipid panel Hgb a1c TSH Vit D Orthopedic referral for lower back pain.   Next appt:  6 months  with Dr. Bubba Camp, 8:30 am 08/04/2017

## 2017-01-29 NOTE — Assessment & Plan Note (Signed)
Will update Vit D level.

## 2017-01-29 NOTE — Assessment & Plan Note (Signed)
Complaining heart burns, continue Omeprazole 20mg  po daily, update CBC

## 2017-01-29 NOTE — Assessment & Plan Note (Signed)
Controlled, continue Losartan 50mg  daily, update CMP, lipid panel

## 2017-01-29 NOTE — Assessment & Plan Note (Signed)
Pain in the right lower back/SIJ area, she feels comfortable when lying down, otherwise painful, desires to see Orthopedic for evaluation.

## 2017-01-30 ENCOUNTER — Other Ambulatory Visit: Payer: Self-pay | Admitting: Nurse Practitioner

## 2017-02-02 MED ORDER — OMEPRAZOLE 20 MG PO CPDR: 20.0000 mg | DELAYED_RELEASE_CAPSULE | Freq: Every day | ORAL | 3 refills | Status: DC

## 2017-03-05 ENCOUNTER — Encounter: Payer: Self-pay | Admitting: Nurse Practitioner

## 2017-03-05 ENCOUNTER — Non-Acute Institutional Stay: Payer: Medicare Other | Admitting: Nurse Practitioner

## 2017-03-05 DIAGNOSIS — G8929 Other chronic pain: Secondary | ICD-10-CM

## 2017-03-05 DIAGNOSIS — I10 Essential (primary) hypertension: Secondary | ICD-10-CM | POA: Diagnosis not present

## 2017-03-05 DIAGNOSIS — E559 Vitamin D deficiency, unspecified: Secondary | ICD-10-CM | POA: Diagnosis not present

## 2017-03-05 DIAGNOSIS — E1142 Type 2 diabetes mellitus with diabetic polyneuropathy: Secondary | ICD-10-CM

## 2017-03-05 DIAGNOSIS — M5440 Lumbago with sciatica, unspecified side: Secondary | ICD-10-CM

## 2017-03-05 DIAGNOSIS — F418 Other specified anxiety disorders: Secondary | ICD-10-CM

## 2017-03-05 DIAGNOSIS — E785 Hyperlipidemia, unspecified: Secondary | ICD-10-CM | POA: Diagnosis not present

## 2017-03-05 DIAGNOSIS — R413 Other amnesia: Secondary | ICD-10-CM

## 2017-03-05 DIAGNOSIS — M25562 Pain in left knee: Secondary | ICD-10-CM

## 2017-03-05 DIAGNOSIS — K219 Gastro-esophageal reflux disease without esophagitis: Secondary | ICD-10-CM

## 2017-03-05 NOTE — Assessment & Plan Note (Signed)
Gradual worse, chronic, prn Ibuprofen.

## 2017-03-05 NOTE — Patient Instructions (Addendum)
CBC CMP Lipid panel, Vitamin D level, TSH prior to the next appointment.   Next appt:  08/04/2017

## 2017-03-05 NOTE — Assessment & Plan Note (Signed)
No recent fractures, update Vit D level prior to the next appointment.

## 2017-03-05 NOTE — Assessment & Plan Note (Signed)
Last MMSE 29/30 05/01/16, she states she has a system to keep up with her daily routine

## 2017-03-05 NOTE — Assessment & Plan Note (Signed)
Mood is better managed with daily activity, she is looking forward to her niece's upcoming visit. Update TSH

## 2017-03-05 NOTE — Assessment & Plan Note (Signed)
Stable, continue Omeprazole.  

## 2017-03-05 NOTE — Assessment & Plan Note (Addendum)
S/p TKR, prn Ibuprofen, update CBC

## 2017-03-05 NOTE — Assessment & Plan Note (Signed)
Diet control, update Lipid panel.

## 2017-03-05 NOTE — Progress Notes (Signed)
Location:      Place of Service:  Clinic (12) Provider: Marlana Latus NP  Code Status: DNR Goals of Care:  Advanced Directives 03/05/2017  Does Patient Have a Medical Advance Directive? Yes  Type of Paramedic of [place];Living will  Does patient want to make changes to medical advance directive? No - Patient declined  Copy of [place] in Chart? -  Would patient like information on creating a medical advance directive? -     Chief Complaint  Patient presents with  . Medical Management of Chronic Issues    6 mo f/u    HPI: Patient is a 83 y.o. female seen today for evaluation of chronic medical conditions.     The patient has history of R+L knee pain, s/p TKR, lower back pain, no pain at night, severe scoliosis, pain is chronic, takes Ibuprofen prn, walking with cane about a mile a day, no fall recently. She sleeps well at night. HTN blood pressure is controlled, taking Losartan 50mg  qd. GERD, taking Omeprazole 20mg  qd, occasional heartburns, denied cough, chest pain, nausea, vomiting, or constipation.   Past Medical History:  Diagnosis Date  . Contact dermatitis and other eczema, due to unspecified cause   . Cortical senile cataract   . GERD (gastroesophageal reflux disease) 02/17/2013  . Hyperparathyroidism, unspecified ([place]) 03/26/2010  . Irritable bowel syndrome   . Keratoconjunctivitis sicca, not specified as Sjogren's   . Loss of weight   . Macular degeneration (senile) of retina, unspecified   . Memory loss   . Other and unspecified hyperlipidemia   . Other malaise and fatigue 06/17/2010  . Pain in joint, site unspecified   . Pathologic fracture of vertebrae   . Reflux esophagitis   . Scoliosis (and kyphoscoliosis), idiopathic   . Senile osteoporosis   . Type II or unspecified type diabetes mellitus without mention of complication, uncontrolled   . Unspecified essential hypertension   . Unspecified hereditary and  idiopathic peripheral neuropathy   . Unspecified vitamin D deficiency     Past Surgical History:  Procedure Laterality Date  . APPENDECTOMY    . BREAST LUMPECTOMY  02/05/2010   Dr. Christie Beckers  . CATARACT EXTRACTION W/ INTRAOCULAR LENS  IMPLANT, BILATERAL  04/2013   Dr. Bing Plume  . CHOLECYSTECTOMY  2004   Dr. Zella Richer  . COLONOSCOPY  2001   Dr. Collene Mares  . fibroidectomy  1950  . [place]  . KYPHOPLASTY  2010   T10 & T12 Dr. Erline Levine  . MEDIAL PARTIAL KNEE REPLACEMENT Left 2004   Dr. Ronnie Derby  . SPINE SURGERY     vertebroplasty T10, T12  . SPINE SURGERY  11/02/2001    C4-5 & C5-6 Titanium Plate Placed Dr. Carloyn Manner  . THYROIDECTOMY, PARTIAL Left 2007  . TOTAL KNEE ARTHROPLASTY Right 2009   Dr. Alvan Dame     Allergies  Allergen Reactions  . Bactrim     unknown  . Betadine [Povidone Iodine]     unknown  . Metformin And Related     Hurt stomach  . Nutmeg Oil (Myristica Oil)     unknown  . Sulfa Antibiotics     unknown    Allergies as of 03/05/2017      Reactions   Bactrim    unknown   Betadine [povidone Iodine]    unknown   Metformin And Related    Hurt stomach   Nutmeg Oil (myristica Oil)    unknown  Sulfa Antibiotics    unknown      Medication List       Accurate as of 03/05/17 11:59 PM. Always use your most recent med list.          glucose blood test strip Commonly known as:  TRUE METRIX BLOOD GLUCOSE TEST Use to check sugar twice a day. Dx:E11.42   ICAPS Tabs Take 1 tablet by mouth daily.   losartan 50 MG tablet Commonly known as:  COZAAR TAKE 1 TABLET ONCE DAILY TO CONTROL BLOOD PRESSURE.   omeprazole 20 MG capsule Commonly known as:  PRILOSEC Take 1 capsule (20 mg total) by mouth daily.   REFRESH 1.4-0.6 % ophthalmic solution Generic drug:  polyvinyl alcohol-povidone 1-2 drops as needed.   TRUEPLUS LANCETS 28G Misc CHECK BLOOD SUGAR TWICE DAILY.            Discharge Care Instructions        Start     Ordered   03/09/17  0000  TSH     03/09/17 1157   03/09/17 0000  CBC     03/09/17 1157   03/09/17 0000  Hemoglobin A1c     03/09/17 1157   03/09/17 0000  CMP and Liver     03/09/17 1157   03/09/17 0000  Lipid panel     03/09/17 1157      Review of Systems:  Review of Systems  Constitutional: Negative for activity change, appetite change, chills, fatigue and fever.  HENT: Positive for hearing loss. Negative for congestion, sneezing and sore throat.        Right ear hearing is not good, but not using hearing aid  Eyes: Negative for visual disturbance.       Corrective glasses  Respiratory: Negative for cough, choking, chest tightness, shortness of breath and wheezing.   Cardiovascular: Negative for chest pain, palpitations and leg swelling.  Gastrointestinal: Negative for abdominal distention, abdominal pain, constipation, diarrhea, nausea and vomiting.  Genitourinary: Negative for difficulty urinating, dysuria, frequency and urgency.       Urination x 1/night.   Musculoskeletal: Positive for arthralgias, back pain and gait problem.       Lower and knees  Skin: Negative for color change and pallor.  Allergic/Immunologic: Negative for environmental allergies and food allergies.  Neurological: Negative for dizziness, tremors, seizures, speech difficulty and weakness.  Psychiatric/Behavioral: Negative for agitation, behavioral problems, confusion and hallucinations. The patient is not nervous/anxious.     Health Maintenance  Topic Date Due  . PNA vac Low Risk Adult (2 of 2 - PCV13) 06/30/2002  . TETANUS/TDAP  07/01/2011  . FOOT EXAM  12/21/2015  . OPHTHALMOLOGY EXAM  04/02/2016  . HEMOGLOBIN A1C  08/14/2016  . INFLUENZA VACCINE  01/28/2017  . DEXA SCAN  Completed    Physical Exam: Vitals:   03/05/17 1324  BP: 120/62  Pulse: 88  Resp: 18  Temp: 98 F (36.7 C)  SpO2: 98%  Weight: 115 lb (52.2 kg)  Height: 5\' 1"  (1.549 m)   Body mass index is 21.73 kg/m. Physical Exam  Constitutional:  She is oriented to person, place, and time. She appears well-developed and well-nourished. No distress.  HENT:  Head: Normocephalic and atraumatic.  Mouth/Throat: Oropharynx is clear and moist. No oropharyngeal exudate.  Eyes: Pupils are equal, round, and reactive to light. Conjunctivae and EOM are normal. Right eye exhibits no discharge. Left eye exhibits no discharge.  Neck: Normal range of motion. Neck supple. No thyromegaly present.  Cardiovascular:  Normal rate, regular rhythm and normal heart sounds.   No murmur heard. Pulmonary/Chest: Effort normal and breath sounds normal. She has no wheezes. She has no rales.  Abdominal: Soft. Bowel sounds are normal. She exhibits no distension. There is no tenderness.  Musculoskeletal: Normal range of motion. She exhibits tenderness. She exhibits no edema.  Arthritic changes of fingers. Lower back pain and knees pain, chronic, worse with activities. Kyphosis. Scoliosis.   Lymphadenopathy:    She has no cervical adenopathy.  Neurological: She is alert and oriented to person, place, and time. She exhibits normal muscle tone. Coordination normal.  Skin: Skin is warm and dry. No rash noted. She is not diaphoretic. No erythema.  Psychiatric: She has a normal mood and affect. Her behavior is normal. Judgment and thought content normal.    Labs reviewed: Basic Metabolic Panel:  Recent Labs  05/29/16 1452  BUN 18  CREATININE 0.79   Liver Function Tests: No results for input(s): AST, ALT, ALKPHOS, BILITOT, PROT, ALBUMIN in the last 8760 hours. No results for input(s): LIPASE, AMYLASE in the last 8760 hours. No results for input(s): AMMONIA in the last 8760 hours. CBC: No results for input(s): WBC, NEUTROABS, HGB, HCT, MCV, PLT in the last 8760 hours. Lipid Panel: No results for input(s): CHOL, HDL, LDLCALC, TRIG, CHOLHDL, LDLDIRECT in the last 8760 hours. Lab Results  Component Value Date   HGBA1C 6.8 02/12/2016    Procedures since last  visit: No results found.  Assessment/Plan Essential hypertension Controlled, continue Losartan 50mg  po qd, update CMP lipid panel prior to the next appointment  GERD (gastroesophageal reflux disease) Stable, continue Omeprazole  DM type 2 with diabetic peripheral neuropathy Stable, diet controlled, update Hgb a1c prior to the next appointment.   Lumbago Gradual worse, chronic, prn Ibuprofen.   Vitamin D deficiency No recent fractures, update Vit D level prior to the next appointment.   Depression with anxiety Mood is better managed with daily activity, she is looking forward to her niece's upcoming visit. Update TSH   Pain in left knee S/p TKR, prn Ibuprofen, update CBC  Hyperlipidemia Diet control, update Lipid panel.   Memory loss Last MMSE 29/30 05/01/16, she states she has a system to keep up with her daily routine    Labs/tests ordered: CBC CMP Hgb A1c,  Lipid panel, vit D level, TSH prior to the next appointment.   Next appt:  08/04/2017  Time spend: 4minutes

## 2017-03-05 NOTE — Assessment & Plan Note (Signed)
Stable, diet controlled, update Hgb a1c prior to the next appointment.

## 2017-03-05 NOTE — Assessment & Plan Note (Signed)
Controlled, continue Losartan 50mg  po qd, update CMP lipid panel prior to the next appointment

## 2017-03-11 ENCOUNTER — Other Ambulatory Visit: Payer: Self-pay | Admitting: Internal Medicine

## 2017-05-26 ENCOUNTER — Telehealth: Payer: Self-pay | Admitting: Internal Medicine

## 2017-05-26 NOTE — Telephone Encounter (Signed)
Left message asking pt to call and schedule AWV-I with nurse at [facility] clinic on morning of 05/28/17 or on 05/29/17. VDM (DD)

## 2017-06-26 NOTE — Patient Instructions (Signed)
[name] , Thank you for taking time to come for your Medicare Wellness Visit. I appreciate your ongoing commitment to your health goals. Please review the following plan we discussed and let me know if I can assist you in the future.   Screening recommendations/referrals: Colonoscopy  Mammogram *** Bone Density *** Recommended yearly ophthalmology/optometry visit for glaucoma screening and checkup Recommended yearly dental visit for hygiene and checkup  Vaccinations: Influenza vaccine *** Pneumococcal vaccine *** Tdap vaccine *** Shingles vaccine ***    Advanced directives: ***  Conditions/risks identified: ***  Next appointment: ***   Preventive Care 65 Years and Older, Female Preventive care refers to lifestyle choices and visits with your health care provider that can promote health and wellness. What does preventive care include?  A yearly physical exam. This is also called an annual well check.  Dental exams once or twice a year.  Routine eye exams. Ask your health care provider how often you should have your eyes checked.  Personal lifestyle choices, including:  Daily care of your teeth and gums.  Regular physical activity.  Eating a healthy diet.  Avoiding tobacco and drug use.  Limiting alcohol use.  Practicing safe sex.  Taking low-dose aspirin every day.  Taking vitamin and mineral supplements as recommended by your health care provider. What happens during an annual well check? The services and screenings done by your health care provider during your annual well check will depend on your age, overall health, lifestyle risk factors, and family history of disease. Counseling  Your health care provider may ask you questions about your:  Alcohol use.  Tobacco use.  Drug use.  Emotional well-being.  Home and relationship well-being.  Sexual activity.  Eating habits.  History of falls.  Memory and ability to understand  (cognition).  Work and work Statistician.  Reproductive health. Screening  You may have the following tests or measurements:  Height, weight, and BMI.  Blood pressure.  Lipid and cholesterol levels. These may be checked every 5 years, or more frequently if you are over 83 years old.  Skin check.  Lung cancer screening. You may have this screening every year starting at age 2 if you have a 30-pack-year history of smoking and currently smoke or have quit within the past 15 years.  Fecal occult blood test (FOBT) of the stool. You may have this test every year starting at age 54.  Flexible sigmoidoscopy or colonoscopy. You may have a sigmoidoscopy every 5 years or a colonoscopy every 10 years starting at age 39.  Hepatitis C blood test.  Hepatitis B blood test.  Sexually transmitted disease (STD) testing.  Diabetes screening. This is done by checking your blood sugar (glucose) after you have not eaten for a while (fasting). You may have this done every 1-3 years.  Bone density scan. This is done to screen for osteoporosis. You may have this done starting at age 53.  Mammogram. This may be done every 1-2 years. Talk to your health care provider about how often you should have regular mammograms. Talk with your health care provider about your test results, treatment options, and if necessary, the need for more tests. Vaccines  Your health care provider may recommend certain vaccines, such as:  Influenza vaccine. This is recommended every year.  Tetanus, diphtheria, and acellular pertussis (Tdap, Td) vaccine. You may need a Td booster every 10 years.  Zoster vaccine. You may need this after age 42.  Pneumococcal 13-valent conjugate (PCV13) vaccine. One  dose is recommended after age 66.  Pneumococcal polysaccharide (PPSV23) vaccine. One dose is recommended after age 65. Talk to your health care provider about which screenings and vaccines you need and how often you need  them. This information is not intended to replace advice given to you by your health care provider. Make sure you discuss any questions you have with your health care provider. Document Released: 07/13/2015 Document Revised: 03/05/2016 Document Reviewed: 04/17/2015 Elsevier Interactive Patient Education  2017 [employer] Prevention in the Home Falls can cause injuries. They can happen to people of all ages. There are many things you can do to make your home safe and to help prevent falls. What can I do on the outside of my home?  Regularly fix the edges of walkways and driveways and fix any cracks.  Remove anything that might make you trip as you walk through a door, such as a raised step or threshold.  Trim any bushes or trees on the path to your home.  Use bright outdoor lighting.  Clear any walking paths of anything that might make someone trip, such as rocks or tools.  Regularly check to see if handrails are loose or broken. Make sure that both sides of any steps have handrails.  Any raised decks and porches should have guardrails on the edges.  Have any leaves, snow, or ice cleared regularly.  Use sand or salt on walking paths during winter.  Clean up any spills in your garage right away. This includes oil or grease spills. What can I do in the bathroom?  Use night lights.  Install grab bars by the toilet and in the tub and shower. Do not use towel bars as grab bars.  Use non-skid mats or decals in the tub or shower.  If you need to sit down in the shower, use a plastic, non-slip stool.  Keep the floor dry. Clean up any water that spills on the floor as soon as it happens.  Remove soap buildup in the tub or shower regularly.  Attach bath mats securely with double-sided non-slip rug tape.  Do not have throw rugs and other things on the floor that can make you trip. What can I do in the bedroom?  Use night lights.  Make sure that you have a light by your  bed that is easy to reach.  Do not use any sheets or blankets that are too big for your bed. They should not hang down onto the floor.  Have a firm chair that has side arms. You can use this for support while you get dressed.  Do not have throw rugs and other things on the floor that can make you trip. What can I do in the kitchen?  Clean up any spills right away.  Avoid walking on wet floors.  Keep items that you use a lot in easy-to-reach places.  If you need to reach something above you, use a strong step stool that has a grab bar.  Keep electrical cords out of the way.  Do not use floor polish or wax that makes floors slippery. If you must use wax, use non-skid floor wax.  Do not have throw rugs and other things on the floor that can make you trip. What can I do with my stairs?  Do not leave any items on the stairs.  Make sure that there are handrails on both sides of the stairs and use them. Fix handrails that are broken or  loose. Make sure that handrails are as long as the stairways.  Check any carpeting to make sure that it is firmly attached to the stairs. Fix any carpet that is loose or worn.  Avoid having throw rugs at the top or bottom of the stairs. If you do have throw rugs, attach them to the floor with carpet tape.  Make sure that you have a light switch at the top of the stairs and the bottom of the stairs. If you do not have them, ask someone to add them for you. What else can I do to help prevent falls?  Wear shoes that:  Do not have high heels.  Have rubber bottoms.  Are comfortable and fit you well.  Are closed at the toe. Do not wear sandals.  If you use a stepladder:  Make sure that it is fully opened. Do not climb a closed stepladder.  Make sure that both sides of the stepladder are locked into place.  Ask someone to hold it for you, if possible.  Clearly mark and make sure that you can see:  Any grab bars or handrails.  First and last  steps.  Where the edge of each step is.  Use tools that help you move around (mobility aids) if they are needed. These include:  Canes.  Walkers.  Scooters.  Crutches.  Turn on the lights when you go into a dark area. Replace any light bulbs as soon as they burn out.  Set up your furniture so you have a clear path. Avoid moving your furniture around.  If any of your floors are uneven, fix them.  If there are any pets around you, be aware of where they are.  Review your medicines with your doctor. Some medicines can make you feel dizzy. This can increase your chance of falling. Ask your doctor what other things that you can do to help prevent falls. This information is not intended to replace advice given to you by your health care provider. Make sure you discuss any questions you have with your health care provider. Document Released: 04/12/2009 Document Revised: 11/22/2015 Document Reviewed: 07/21/2014 Elsevier Interactive Patient Education  2017 [employer].

## 2017-06-26 NOTE — Progress Notes (Signed)
ERROR

## 2017-07-09 ENCOUNTER — Non-Acute Institutional Stay: Payer: Medicare Other | Admitting: Nurse Practitioner

## 2017-07-09 ENCOUNTER — Encounter: Payer: Self-pay | Admitting: Nurse Practitioner

## 2017-07-09 DIAGNOSIS — I10 Essential (primary) hypertension: Secondary | ICD-10-CM

## 2017-07-09 DIAGNOSIS — R531 Weakness: Secondary | ICD-10-CM | POA: Diagnosis not present

## 2017-07-09 DIAGNOSIS — J069 Acute upper respiratory infection, unspecified: Secondary | ICD-10-CM

## 2017-07-09 DIAGNOSIS — R05 Cough: Secondary | ICD-10-CM | POA: Diagnosis not present

## 2017-07-09 NOTE — Assessment & Plan Note (Signed)
congestive cough, unable to cough up sputum for about 2 weeks, her appetite has been poor, she became weak that she is unable to walk from apartment room to dinning room. She is seen in wheelchair today. She denied fever, chills, chest pain/pressure, palpitation, SOB, nausea, vomiting, constipation or diarrhea. She has no O2 desaturation.    Will obtain CXR, ap to rule out PNA, update CBC/diff in am, Mucinex 600mg  bid x 7days, DuoNeb q8h x 3 days.

## 2017-07-09 NOTE — Progress Notes (Signed)
Location:   [facility]   Place of Service:  Clinic (12) Provider: Marlana Latus NP  Code Status: DNR Goals of Care: [facility] for infirmary stay Advanced Directives 07/09/2017  Does Patient Have a Medical Advance Directive? Yes  Type of Paramedic of [place];Living will  Does patient want to make changes to medical advance directive? No - Patient declined  Copy of [place] in Chart? Yes  Would patient like information on creating a medical advance directive? -     Chief Complaint  Patient presents with  . Acute Visit    cough and congestion    HPI: Patient is a 84 y.o. female seen today for an acute visit for congestive cough, unable to cough up sputum for about 2 weeks, her appetite has been poor, she became weak that she is unable to walk from apartment room to dinning room. She is seen in wheelchair today. She denied fever, chills, chest pain/pressure, palpitation, SOB, nausea, vomiting, constipation or diarrhea. She has no O2 desaturation.    Her blood pressure is elevated today,  on Losartan 50mg  daily. She denied headache, dizziness, vision change, or focal weakness  Past Medical History:  Diagnosis Date  . Contact dermatitis and other eczema, due to unspecified cause   . Cortical senile cataract   . GERD (gastroesophageal reflux disease) 02/17/2013  . Hyperparathyroidism, unspecified ([place]) 03/26/2010  . Irritable bowel syndrome   . Keratoconjunctivitis sicca, not specified as Sjogren's   . Loss of weight   . Macular degeneration (senile) of retina, unspecified   . Memory loss   . Other and unspecified hyperlipidemia   . Other malaise and fatigue 06/17/2010  . Pain in joint, site unspecified   . Pathologic fracture of vertebrae   . Reflux esophagitis   . Scoliosis (and kyphoscoliosis), idiopathic   . Senile osteoporosis   . Type II or unspecified type diabetes mellitus without mention of complication, uncontrolled   . Unspecified  essential hypertension   . Unspecified hereditary and idiopathic peripheral neuropathy   . Unspecified vitamin D deficiency     Past Surgical History:  Procedure Laterality Date  . APPENDECTOMY    . BREAST LUMPECTOMY  02/05/2010   Dr. Christie Beckers  . CATARACT EXTRACTION W/ INTRAOCULAR LENS  IMPLANT, BILATERAL  04/2013   Dr. Bing Plume  . CHOLECYSTECTOMY  2004   Dr. Zella Richer  . COLONOSCOPY  2001   Dr. Collene Mares  . fibroidectomy  1950  . [place]  . KYPHOPLASTY  2010   T10 & T12 Dr. Erline Levine  . MEDIAL PARTIAL KNEE REPLACEMENT Left 2004   Dr. Ronnie Derby  . SPINE SURGERY     vertebroplasty T10, T12  . SPINE SURGERY  11/02/2001    C4-5 & C5-6 Titanium Plate Placed Dr. Carloyn Manner  . THYROIDECTOMY, PARTIAL Left 2007  . TOTAL KNEE ARTHROPLASTY Right 2009   Dr. Alvan Dame     Allergies  Allergen Reactions  . Bactrim     unknown  . Betadine [Povidone Iodine]     unknown  . Metformin And Related     Hurt stomach  . Nutmeg Oil (Myristica Oil)     unknown  . Sulfa Antibiotics     unknown    Allergies as of 07/09/2017      Reactions   Bactrim    unknown   Betadine [povidone Iodine]    unknown   Metformin And Related    Hurt stomach   Nutmeg Oil (  myristica Oil)    unknown   Sulfa Antibiotics    unknown      Medication List        Accurate as of 07/09/17 11:59 PM. Always use your most recent med list.          glucose blood test strip Commonly known as:  TRUE METRIX BLOOD GLUCOSE TEST Use to check sugar twice a day. Dx:E11.42   ICAPS Tabs Take 1 tablet by mouth daily.   losartan 50 MG tablet Commonly known as:  COZAAR TAKE 1 TABLET ONCE DAILY TO CONTROL BLOOD PRESSURE.   REFRESH 1.4-0.6 % ophthalmic solution Generic drug:  polyvinyl alcohol-povidone 1-2 drops as needed.   TRUEPLUS LANCETS 28G Misc CHECK BLOOD SUGAR TWICE DAILY.       Review of Systems:  Review of Systems  Constitutional: Positive for activity change, appetite change and fatigue. Negative  for chills, diaphoresis and fever.  HENT: Positive for congestion and hearing loss. Negative for trouble swallowing and voice change.   Eyes: Negative for visual disturbance.  Respiratory: Positive for cough. Negative for choking, chest tightness, shortness of breath and wheezing.   Cardiovascular: Negative for leg swelling.  Gastrointestinal: Negative for abdominal distention, abdominal pain, constipation, diarrhea, nausea and vomiting.  Endocrine: Negative for cold intolerance.  Genitourinary: Negative for difficulty urinating, dysuria and urgency.  Musculoskeletal: Positive for gait problem.  Neurological: Negative for tremors, speech difficulty, weakness and headaches.  Psychiatric/Behavioral: Negative for agitation, behavioral problems, confusion and sleep disturbance. The patient is not nervous/anxious.     Health Maintenance  Topic Date Due  . PNA vac Low Risk Adult (2 of 2 - PCV13) 06/30/2002  . TETANUS/TDAP  07/01/2011  . FOOT EXAM  12/21/2015  . OPHTHALMOLOGY EXAM  04/02/2016  . HEMOGLOBIN A1C  08/14/2016  . INFLUENZA VACCINE  07/29/2018 (Originally 01/28/2017)  . DEXA SCAN  Completed    Physical Exam: Vitals:   07/09/17 1414  BP: (!) 160/62  Pulse: 87  Resp: 20  Temp: 98 F (36.7 C)  SpO2: 97%  Weight: 112 lb 3.2 oz (50.9 kg)  Height: 5\' 1"  (1.549 m)   Body mass index is 21.2 kg/m. Physical Exam  Constitutional: She is oriented to person, place, and time. She appears well-developed and well-nourished. No distress.  HENT:  Head: Normocephalic and atraumatic.  Mouth/Throat: Oropharynx is clear and moist. No oropharyngeal exudate.  Eyes: Conjunctivae and EOM are normal. Pupils are equal, round, and reactive to light.  Neck: Normal range of motion. Neck supple. No JVD present. No thyromegaly present.  Cardiovascular: Normal rate and normal heart sounds.  No murmur heard. Pulmonary/Chest: Effort normal. She has no wheezes. She has no rales.  Central congestion    Abdominal: Soft. Bowel sounds are normal. She exhibits no distension. There is no tenderness.  Musculoskeletal: Normal range of motion. She exhibits no edema or tenderness.  Neurological: She is alert and oriented to person, place, and time. She has normal reflexes. She exhibits normal muscle tone. Coordination normal.  Skin: Skin is warm and dry. No rash noted. She is not diaphoretic. No erythema.  Psychiatric: She has a normal mood and affect. Her behavior is normal. Judgment and thought content normal.    Labs reviewed: Basic Metabolic Panel: Recent Labs    07/10/17  NA 136*  K 4.3  BUN 16  CREATININE 0.9   Liver Function Tests: Recent Labs    07/10/17  AST 15  ALT 10  ALKPHOS 62   No results  for input(s): LIPASE, AMYLASE in the last 8760 hours. No results for input(s): AMMONIA in the last 8760 hours. CBC: Recent Labs    07/10/17  WBC 10.6  HGB 12.8  HCT 36  PLT 297   Lipid Panel: No results for input(s): CHOL, HDL, LDLCALC, TRIG, CHOLHDL, LDLDIRECT in the last 8760 hours. Lab Results  Component Value Date   HGBA1C 6.8 02/12/2016    Procedures since last visit: No results found.  Assessment/Plan Acute upper respiratory infection congestive cough, unable to cough up sputum for about 2 weeks, her appetite has been poor, she became weak that she is unable to walk from apartment room to dinning room. She is seen in wheelchair today. She denied fever, chills, chest pain/pressure, palpitation, SOB, nausea, vomiting, constipation or diarrhea. She has no O2 desaturation.    Will obtain CXR, ap to rule out PNA, update CBC/diff in am, Mucinex 600mg  bid x 7days, DuoNeb q8h x 3 days.     Generalized weakness congestive cough, unable to cough up sputum for about 2 weeks, her appetite has been poor, she became weak that she is unable to walk from apartment room to dinning room. She is seen in wheelchair today. Will admit to AL for a short stay    Essential  hypertension Her blood pressure is controlled usually, elevated today, asymptomatic, will continue Losartan 50mg  daily, monitor BP, re-evaluate if blood pressure is elevated persistently.       Labs/tests ordered: CXR Ap, CBC/Diff, CMP  Plan of care reviewed with the patient and charge nurse, Prevnar 13 when the patient is better, prescription written.   Next appt:  08/04/2017  Time spend 25 minutes.

## 2017-07-09 NOTE — Assessment & Plan Note (Addendum)
Her blood pressure is controlled usually, elevated today, asymptomatic, will continue Losartan 50mg  daily, monitor BP, re-evaluate if blood pressure is elevated persistently.

## 2017-07-09 NOTE — Assessment & Plan Note (Signed)
congestive cough, unable to cough up sputum for about 2 weeks, her appetite has been poor, she became weak that she is unable to walk from apartment room to dinning room. She is seen in wheelchair today. Will admit to AL for a short stay

## 2017-07-10 DIAGNOSIS — R05 Cough: Secondary | ICD-10-CM | POA: Diagnosis not present

## 2017-07-10 DIAGNOSIS — J069 Acute upper respiratory infection, unspecified: Secondary | ICD-10-CM | POA: Diagnosis not present

## 2017-07-10 LAB — CBC AND DIFFERENTIAL
HCT: 36 (ref 36–46)
Hemoglobin: 12.8 (ref 12.0–16.0)
Platelets: 297 (ref 150–399)
WBC: 10.6

## 2017-07-10 LAB — BASIC METABOLIC PANEL
BUN: 16 (ref 4–21)
Creatinine: 0.9 (ref <=1.1)
Glucose: 121
Potassium: 4.3 (ref 3.4–5.3)
Sodium: 136 — AB (ref 137–147)

## 2017-07-10 LAB — HEPATIC FUNCTION PANEL
ALT: 10 (ref 7–35)
AST: 15 (ref 13–35)
Alkaline Phosphatase: 62 (ref 25–125)
Bilirubin, Total: 0.7

## 2017-07-13 ENCOUNTER — Other Ambulatory Visit: Payer: Self-pay | Admitting: *Deleted

## 2017-07-15 DIAGNOSIS — R278 Other lack of coordination: Secondary | ICD-10-CM | POA: Diagnosis not present

## 2017-07-15 DIAGNOSIS — R531 Weakness: Secondary | ICD-10-CM | POA: Diagnosis not present

## 2017-07-15 DIAGNOSIS — M545 Low back pain: Secondary | ICD-10-CM | POA: Diagnosis not present

## 2017-07-15 DIAGNOSIS — M4120 Other idiopathic scoliosis, site unspecified: Secondary | ICD-10-CM | POA: Diagnosis not present

## 2017-07-15 DIAGNOSIS — M6281 Muscle weakness (generalized): Secondary | ICD-10-CM | POA: Diagnosis not present

## 2017-07-15 DIAGNOSIS — Z7389 Other problems related to life management difficulty: Secondary | ICD-10-CM | POA: Diagnosis not present

## 2017-07-15 DIAGNOSIS — R2681 Unsteadiness on feet: Secondary | ICD-10-CM | POA: Diagnosis not present

## 2017-07-15 DIAGNOSIS — R293 Abnormal posture: Secondary | ICD-10-CM | POA: Diagnosis not present

## 2017-07-15 DIAGNOSIS — R5383 Other fatigue: Secondary | ICD-10-CM | POA: Diagnosis not present

## 2017-07-15 DIAGNOSIS — R41841 Cognitive communication deficit: Secondary | ICD-10-CM | POA: Diagnosis not present

## 2017-07-16 DIAGNOSIS — M545 Low back pain: Secondary | ICD-10-CM | POA: Diagnosis not present

## 2017-07-16 DIAGNOSIS — R2681 Unsteadiness on feet: Secondary | ICD-10-CM | POA: Diagnosis not present

## 2017-07-16 DIAGNOSIS — R293 Abnormal posture: Secondary | ICD-10-CM | POA: Diagnosis not present

## 2017-07-16 DIAGNOSIS — R278 Other lack of coordination: Secondary | ICD-10-CM | POA: Diagnosis not present

## 2017-07-16 DIAGNOSIS — M6281 Muscle weakness (generalized): Secondary | ICD-10-CM | POA: Diagnosis not present

## 2017-07-16 DIAGNOSIS — M4120 Other idiopathic scoliosis, site unspecified: Secondary | ICD-10-CM | POA: Diagnosis not present

## 2017-07-17 DIAGNOSIS — R293 Abnormal posture: Secondary | ICD-10-CM | POA: Diagnosis not present

## 2017-07-17 DIAGNOSIS — M6281 Muscle weakness (generalized): Secondary | ICD-10-CM | POA: Diagnosis not present

## 2017-07-17 DIAGNOSIS — R278 Other lack of coordination: Secondary | ICD-10-CM | POA: Diagnosis not present

## 2017-07-17 DIAGNOSIS — M545 Low back pain: Secondary | ICD-10-CM | POA: Diagnosis not present

## 2017-07-17 DIAGNOSIS — R2681 Unsteadiness on feet: Secondary | ICD-10-CM | POA: Diagnosis not present

## 2017-07-17 DIAGNOSIS — M4120 Other idiopathic scoliosis, site unspecified: Secondary | ICD-10-CM | POA: Diagnosis not present

## 2017-07-20 DIAGNOSIS — R293 Abnormal posture: Secondary | ICD-10-CM | POA: Diagnosis not present

## 2017-07-20 DIAGNOSIS — M545 Low back pain: Secondary | ICD-10-CM | POA: Diagnosis not present

## 2017-07-20 DIAGNOSIS — R2681 Unsteadiness on feet: Secondary | ICD-10-CM | POA: Diagnosis not present

## 2017-07-20 DIAGNOSIS — M4120 Other idiopathic scoliosis, site unspecified: Secondary | ICD-10-CM | POA: Diagnosis not present

## 2017-07-20 DIAGNOSIS — R278 Other lack of coordination: Secondary | ICD-10-CM | POA: Diagnosis not present

## 2017-07-20 DIAGNOSIS — M6281 Muscle weakness (generalized): Secondary | ICD-10-CM | POA: Diagnosis not present

## 2017-07-21 DIAGNOSIS — R2681 Unsteadiness on feet: Secondary | ICD-10-CM | POA: Diagnosis not present

## 2017-07-21 DIAGNOSIS — M4120 Other idiopathic scoliosis, site unspecified: Secondary | ICD-10-CM | POA: Diagnosis not present

## 2017-07-21 DIAGNOSIS — M545 Low back pain: Secondary | ICD-10-CM | POA: Diagnosis not present

## 2017-07-21 DIAGNOSIS — R293 Abnormal posture: Secondary | ICD-10-CM | POA: Diagnosis not present

## 2017-07-21 DIAGNOSIS — M6281 Muscle weakness (generalized): Secondary | ICD-10-CM | POA: Diagnosis not present

## 2017-07-21 DIAGNOSIS — R278 Other lack of coordination: Secondary | ICD-10-CM | POA: Diagnosis not present

## 2017-07-22 DIAGNOSIS — R293 Abnormal posture: Secondary | ICD-10-CM | POA: Diagnosis not present

## 2017-07-22 DIAGNOSIS — M4120 Other idiopathic scoliosis, site unspecified: Secondary | ICD-10-CM | POA: Diagnosis not present

## 2017-07-22 DIAGNOSIS — R278 Other lack of coordination: Secondary | ICD-10-CM | POA: Diagnosis not present

## 2017-07-22 DIAGNOSIS — M6281 Muscle weakness (generalized): Secondary | ICD-10-CM | POA: Diagnosis not present

## 2017-07-22 DIAGNOSIS — R2681 Unsteadiness on feet: Secondary | ICD-10-CM | POA: Diagnosis not present

## 2017-07-22 DIAGNOSIS — M545 Low back pain: Secondary | ICD-10-CM | POA: Diagnosis not present

## 2017-07-23 DIAGNOSIS — R413 Other amnesia: Secondary | ICD-10-CM | POA: Diagnosis not present

## 2017-07-23 DIAGNOSIS — R293 Abnormal posture: Secondary | ICD-10-CM | POA: Diagnosis not present

## 2017-07-23 DIAGNOSIS — M6281 Muscle weakness (generalized): Secondary | ICD-10-CM | POA: Diagnosis not present

## 2017-07-23 DIAGNOSIS — M859 Disorder of bone density and structure, unspecified: Secondary | ICD-10-CM | POA: Diagnosis not present

## 2017-07-23 DIAGNOSIS — E118 Type 2 diabetes mellitus with unspecified complications: Secondary | ICD-10-CM | POA: Diagnosis not present

## 2017-07-23 DIAGNOSIS — R32 Unspecified urinary incontinence: Secondary | ICD-10-CM | POA: Diagnosis not present

## 2017-07-23 DIAGNOSIS — R29898 Other symptoms and signs involving the musculoskeletal system: Secondary | ICD-10-CM | POA: Diagnosis not present

## 2017-07-23 DIAGNOSIS — R5383 Other fatigue: Secondary | ICD-10-CM | POA: Diagnosis not present

## 2017-07-23 DIAGNOSIS — M810 Age-related osteoporosis without current pathological fracture: Secondary | ICD-10-CM | POA: Diagnosis not present

## 2017-07-23 DIAGNOSIS — R41841 Cognitive communication deficit: Secondary | ICD-10-CM | POA: Diagnosis not present

## 2017-07-23 DIAGNOSIS — R278 Other lack of coordination: Secondary | ICD-10-CM | POA: Diagnosis not present

## 2017-07-23 DIAGNOSIS — M4120 Other idiopathic scoliosis, site unspecified: Secondary | ICD-10-CM | POA: Diagnosis not present

## 2017-07-23 DIAGNOSIS — R531 Weakness: Secondary | ICD-10-CM | POA: Diagnosis not present

## 2017-07-23 DIAGNOSIS — Z7389 Other problems related to life management difficulty: Secondary | ICD-10-CM | POA: Diagnosis not present

## 2017-07-23 DIAGNOSIS — E0590 Thyrotoxicosis, unspecified without thyrotoxic crisis or storm: Secondary | ICD-10-CM | POA: Diagnosis not present

## 2017-07-23 DIAGNOSIS — R29818 Other symptoms and signs involving the nervous system: Secondary | ICD-10-CM | POA: Diagnosis not present

## 2017-07-23 DIAGNOSIS — M549 Dorsalgia, unspecified: Secondary | ICD-10-CM | POA: Diagnosis not present

## 2017-07-23 DIAGNOSIS — M67419 Ganglion, unspecified shoulder: Secondary | ICD-10-CM | POA: Diagnosis not present

## 2017-07-23 DIAGNOSIS — M545 Low back pain: Secondary | ICD-10-CM | POA: Diagnosis not present

## 2017-07-23 DIAGNOSIS — R2681 Unsteadiness on feet: Secondary | ICD-10-CM | POA: Diagnosis not present

## 2017-07-24 DIAGNOSIS — R2681 Unsteadiness on feet: Secondary | ICD-10-CM | POA: Diagnosis not present

## 2017-07-24 DIAGNOSIS — M545 Low back pain: Secondary | ICD-10-CM | POA: Diagnosis not present

## 2017-07-24 DIAGNOSIS — R278 Other lack of coordination: Secondary | ICD-10-CM | POA: Diagnosis not present

## 2017-07-24 DIAGNOSIS — R5383 Other fatigue: Secondary | ICD-10-CM | POA: Diagnosis not present

## 2017-07-24 DIAGNOSIS — M6281 Muscle weakness (generalized): Secondary | ICD-10-CM | POA: Diagnosis not present

## 2017-07-24 DIAGNOSIS — Z7389 Other problems related to life management difficulty: Secondary | ICD-10-CM | POA: Diagnosis not present

## 2017-07-27 DIAGNOSIS — R2681 Unsteadiness on feet: Secondary | ICD-10-CM | POA: Diagnosis not present

## 2017-07-27 DIAGNOSIS — Z7389 Other problems related to life management difficulty: Secondary | ICD-10-CM | POA: Diagnosis not present

## 2017-07-27 DIAGNOSIS — M545 Low back pain: Secondary | ICD-10-CM | POA: Diagnosis not present

## 2017-07-27 DIAGNOSIS — R5383 Other fatigue: Secondary | ICD-10-CM | POA: Diagnosis not present

## 2017-07-27 DIAGNOSIS — M6281 Muscle weakness (generalized): Secondary | ICD-10-CM | POA: Diagnosis not present

## 2017-07-27 DIAGNOSIS — R278 Other lack of coordination: Secondary | ICD-10-CM | POA: Diagnosis not present

## 2017-07-28 DIAGNOSIS — M545 Low back pain: Secondary | ICD-10-CM | POA: Diagnosis not present

## 2017-07-28 DIAGNOSIS — R5383 Other fatigue: Secondary | ICD-10-CM | POA: Diagnosis not present

## 2017-07-28 DIAGNOSIS — Z7389 Other problems related to life management difficulty: Secondary | ICD-10-CM | POA: Diagnosis not present

## 2017-07-28 DIAGNOSIS — M6281 Muscle weakness (generalized): Secondary | ICD-10-CM | POA: Diagnosis not present

## 2017-07-28 DIAGNOSIS — R278 Other lack of coordination: Secondary | ICD-10-CM | POA: Diagnosis not present

## 2017-07-28 DIAGNOSIS — R2681 Unsteadiness on feet: Secondary | ICD-10-CM | POA: Diagnosis not present

## 2017-07-29 DIAGNOSIS — R5383 Other fatigue: Secondary | ICD-10-CM | POA: Diagnosis not present

## 2017-07-29 DIAGNOSIS — R2681 Unsteadiness on feet: Secondary | ICD-10-CM | POA: Diagnosis not present

## 2017-07-29 DIAGNOSIS — M545 Low back pain: Secondary | ICD-10-CM | POA: Diagnosis not present

## 2017-07-29 DIAGNOSIS — M6281 Muscle weakness (generalized): Secondary | ICD-10-CM | POA: Diagnosis not present

## 2017-07-29 DIAGNOSIS — Z7389 Other problems related to life management difficulty: Secondary | ICD-10-CM | POA: Diagnosis not present

## 2017-07-29 DIAGNOSIS — R278 Other lack of coordination: Secondary | ICD-10-CM | POA: Diagnosis not present

## 2017-07-30 DIAGNOSIS — R5383 Other fatigue: Secondary | ICD-10-CM | POA: Diagnosis not present

## 2017-07-30 DIAGNOSIS — M6281 Muscle weakness (generalized): Secondary | ICD-10-CM | POA: Diagnosis not present

## 2017-07-30 DIAGNOSIS — M545 Low back pain: Secondary | ICD-10-CM | POA: Diagnosis not present

## 2017-07-30 DIAGNOSIS — Z7389 Other problems related to life management difficulty: Secondary | ICD-10-CM | POA: Diagnosis not present

## 2017-07-30 DIAGNOSIS — R2681 Unsteadiness on feet: Secondary | ICD-10-CM | POA: Diagnosis not present

## 2017-07-30 DIAGNOSIS — R278 Other lack of coordination: Secondary | ICD-10-CM | POA: Diagnosis not present

## 2017-08-03 DIAGNOSIS — R2681 Unsteadiness on feet: Secondary | ICD-10-CM | POA: Diagnosis not present

## 2017-08-03 DIAGNOSIS — R293 Abnormal posture: Secondary | ICD-10-CM | POA: Diagnosis not present

## 2017-08-03 DIAGNOSIS — M4120 Other idiopathic scoliosis, site unspecified: Secondary | ICD-10-CM | POA: Diagnosis not present

## 2017-08-03 DIAGNOSIS — R278 Other lack of coordination: Secondary | ICD-10-CM | POA: Diagnosis not present

## 2017-08-03 DIAGNOSIS — M6281 Muscle weakness (generalized): Secondary | ICD-10-CM | POA: Diagnosis not present

## 2017-08-03 DIAGNOSIS — Z7389 Other problems related to life management difficulty: Secondary | ICD-10-CM | POA: Diagnosis not present

## 2017-08-03 DIAGNOSIS — R41841 Cognitive communication deficit: Secondary | ICD-10-CM | POA: Diagnosis not present

## 2017-08-03 DIAGNOSIS — R531 Weakness: Secondary | ICD-10-CM | POA: Diagnosis not present

## 2017-08-03 DIAGNOSIS — M545 Low back pain: Secondary | ICD-10-CM | POA: Diagnosis not present

## 2017-08-03 DIAGNOSIS — R5383 Other fatigue: Secondary | ICD-10-CM | POA: Diagnosis not present

## 2017-08-04 ENCOUNTER — Encounter: Payer: Medicare Other | Admitting: Internal Medicine

## 2017-08-04 DIAGNOSIS — Z7389 Other problems related to life management difficulty: Secondary | ICD-10-CM | POA: Diagnosis not present

## 2017-08-04 DIAGNOSIS — M6281 Muscle weakness (generalized): Secondary | ICD-10-CM | POA: Diagnosis not present

## 2017-08-04 DIAGNOSIS — R278 Other lack of coordination: Secondary | ICD-10-CM | POA: Diagnosis not present

## 2017-08-04 DIAGNOSIS — R2681 Unsteadiness on feet: Secondary | ICD-10-CM | POA: Diagnosis not present

## 2017-08-04 DIAGNOSIS — M545 Low back pain: Secondary | ICD-10-CM | POA: Diagnosis not present

## 2017-08-04 DIAGNOSIS — R5383 Other fatigue: Secondary | ICD-10-CM | POA: Diagnosis not present

## 2017-08-05 DIAGNOSIS — Z7389 Other problems related to life management difficulty: Secondary | ICD-10-CM | POA: Diagnosis not present

## 2017-08-05 DIAGNOSIS — R278 Other lack of coordination: Secondary | ICD-10-CM | POA: Diagnosis not present

## 2017-08-05 DIAGNOSIS — R2681 Unsteadiness on feet: Secondary | ICD-10-CM | POA: Diagnosis not present

## 2017-08-05 DIAGNOSIS — M545 Low back pain: Secondary | ICD-10-CM | POA: Diagnosis not present

## 2017-08-05 DIAGNOSIS — M6281 Muscle weakness (generalized): Secondary | ICD-10-CM | POA: Diagnosis not present

## 2017-08-05 DIAGNOSIS — R5383 Other fatigue: Secondary | ICD-10-CM | POA: Diagnosis not present

## 2017-08-06 DIAGNOSIS — R2681 Unsteadiness on feet: Secondary | ICD-10-CM | POA: Diagnosis not present

## 2017-08-06 DIAGNOSIS — M6281 Muscle weakness (generalized): Secondary | ICD-10-CM | POA: Diagnosis not present

## 2017-08-06 DIAGNOSIS — M545 Low back pain: Secondary | ICD-10-CM | POA: Diagnosis not present

## 2017-08-06 DIAGNOSIS — R5383 Other fatigue: Secondary | ICD-10-CM | POA: Diagnosis not present

## 2017-08-06 DIAGNOSIS — R278 Other lack of coordination: Secondary | ICD-10-CM | POA: Diagnosis not present

## 2017-08-06 DIAGNOSIS — Z7389 Other problems related to life management difficulty: Secondary | ICD-10-CM | POA: Diagnosis not present

## 2017-08-11 DIAGNOSIS — M545 Low back pain: Secondary | ICD-10-CM | POA: Diagnosis not present

## 2017-08-11 DIAGNOSIS — M6281 Muscle weakness (generalized): Secondary | ICD-10-CM | POA: Diagnosis not present

## 2017-08-11 DIAGNOSIS — R278 Other lack of coordination: Secondary | ICD-10-CM | POA: Diagnosis not present

## 2017-08-11 DIAGNOSIS — R5383 Other fatigue: Secondary | ICD-10-CM | POA: Diagnosis not present

## 2017-08-11 DIAGNOSIS — R2681 Unsteadiness on feet: Secondary | ICD-10-CM | POA: Diagnosis not present

## 2017-08-11 DIAGNOSIS — Z7389 Other problems related to life management difficulty: Secondary | ICD-10-CM | POA: Diagnosis not present

## 2017-08-12 ENCOUNTER — Encounter: Payer: Self-pay | Admitting: Nurse Practitioner

## 2017-08-12 ENCOUNTER — Non-Acute Institutional Stay: Payer: Medicare Other | Admitting: Nurse Practitioner

## 2017-08-12 DIAGNOSIS — R413 Other amnesia: Secondary | ICD-10-CM

## 2017-08-12 DIAGNOSIS — I10 Essential (primary) hypertension: Secondary | ICD-10-CM

## 2017-08-12 DIAGNOSIS — R5383 Other fatigue: Secondary | ICD-10-CM | POA: Diagnosis not present

## 2017-08-12 DIAGNOSIS — E1142 Type 2 diabetes mellitus with diabetic polyneuropathy: Secondary | ICD-10-CM

## 2017-08-12 DIAGNOSIS — R2681 Unsteadiness on feet: Secondary | ICD-10-CM | POA: Diagnosis not present

## 2017-08-12 DIAGNOSIS — R531 Weakness: Secondary | ICD-10-CM

## 2017-08-12 DIAGNOSIS — M545 Low back pain: Secondary | ICD-10-CM | POA: Diagnosis not present

## 2017-08-12 DIAGNOSIS — J069 Acute upper respiratory infection, unspecified: Secondary | ICD-10-CM

## 2017-08-12 DIAGNOSIS — Z7389 Other problems related to life management difficulty: Secondary | ICD-10-CM | POA: Diagnosis not present

## 2017-08-12 DIAGNOSIS — R278 Other lack of coordination: Secondary | ICD-10-CM | POA: Diagnosis not present

## 2017-08-12 DIAGNOSIS — M6281 Muscle weakness (generalized): Secondary | ICD-10-CM | POA: Diagnosis not present

## 2017-08-12 NOTE — Progress Notes (Signed)
Location:  [facility] Room Number: 115 Place of Service:  SNF ([phone]) Provider:  Jamyria Ozanich, Manxie  NP  Blanchie Serve, MD  Patient Care Team: Blanchie Serve, MD as PCP - General (Internal Medicine) [place] Niani Mourer X, NP as Nurse Practitioner (Nurse Practitioner) Jacelyn Pi, MD as Consulting Physician (Endocrinology) Calvert Cantor, MD as Consulting Physician (Ophthalmology) Vickey Huger, MD as Consulting Physician (Orthopedic Surgery) Suella Broad, MD as Consulting Physician (Physical Medicine and Rehabilitation) Erline Levine, MD as Consulting Physician (Neurosurgery) Juanita Craver, MD as Consulting Physician (Gastroenterology) Martinique, Peter M, MD as Consulting Physician (Cardiology) Megan Salon, MD as Consulting Physician (Gynecology)  Extended Emergency Contact Information Primary Emergency Contact: [name], [place] Phone: [phone] Relation: Friend  Code Status:  Full Code Goals of care: Advanced Directive information Advanced Directives 07/09/2017  Does Patient Have a Medical Advance Directive? Yes  Type of Paramedic of [place];Living will  Does patient want to make changes to medical advance directive? No - Patient declined  Copy of [place] in Chart? Yes  Would patient like information on creating a medical advance directive? -     Chief Complaint  Patient presents with  . Medical Management of Chronic Issues    HPI:  Pt is a 84 y.o. female seen today for medical management of chronic diseases.      The patient has history of HTN, blood pressure is controlled, on Losartan 50mg  daily. Diet controlled T2DM, she has concerns of her blood sugar, no recent Hgb a1c. She does have memory lapses.    Past Medical History:  Diagnosis Date  . Contact dermatitis and other eczema, due to unspecified cause   . Cortical senile  cataract   . GERD (gastroesophageal reflux disease) 02/17/2013  . Hyperparathyroidism, unspecified ([place]) 03/26/2010  . Irritable bowel syndrome   . Keratoconjunctivitis sicca, not specified as Sjogren's   . Loss of weight   . Macular degeneration (senile) of retina, unspecified   . Memory loss   . Other and unspecified hyperlipidemia   . Other malaise and fatigue 06/17/2010  . Pain in joint, site unspecified   . Pathologic fracture of vertebrae   . Reflux esophagitis   . Scoliosis (and kyphoscoliosis), idiopathic   . Senile osteoporosis   . Type II or unspecified type diabetes mellitus without mention of complication, uncontrolled   . Unspecified essential hypertension   . Unspecified hereditary and idiopathic peripheral neuropathy   . Unspecified vitamin D deficiency    Past Surgical History:  Procedure Laterality Date  . APPENDECTOMY    . BREAST LUMPECTOMY  02/05/2010   Dr. Christie Beckers  . CATARACT EXTRACTION W/ INTRAOCULAR LENS  IMPLANT, BILATERAL  04/2013   Dr. Bing Plume  . CHOLECYSTECTOMY  2004   Dr. Zella Richer  . COLONOSCOPY  2001   Dr. Collene Mares  . fibroidectomy  1950  . [place]  . KYPHOPLASTY  2010   T10 & T12 Dr. Erline Levine  . MEDIAL PARTIAL KNEE REPLACEMENT Left 2004   Dr. Ronnie Derby  . SPINE SURGERY     vertebroplasty T10, T12  . SPINE SURGERY  11/02/2001    C4-5 & C5-6 Titanium Plate Placed Dr. Carloyn Manner  . THYROIDECTOMY, PARTIAL Left 2007  . TOTAL KNEE ARTHROPLASTY Right 2009   Dr. Alvan Dame     Allergies  Allergen Reactions  . Bactrim  unknown  . Betadine [Povidone Iodine]     unknown  . Metformin And Related     Hurt stomach  . Nutmeg Oil (Myristica Oil)     unknown  . Sulfa Antibiotics     unknown    Outpatient Encounter Medications as of 08/12/2017  Medication Sig  . glucose blood (TRUE METRIX BLOOD GLUCOSE TEST) test strip Use to check sugar twice a day. Dx:E11.42  . losartan (COZAAR) 50 MG tablet TAKE 1 TABLET ONCE DAILY TO CONTROL BLOOD  PRESSURE.  . Multiple Vitamins-Minerals (ICAPS) TABS Take 1 tablet by mouth daily.  . TRUEPLUS LANCETS 28G MISC CHECK BLOOD SUGAR TWICE DAILY.  . [DISCONTINUED] polyvinyl alcohol-povidone (REFRESH) 1.4-0.6 % ophthalmic solution 1-2 drops as needed.   No facility-administered encounter medications on file as of 08/12/2017.     Review of Systems  Constitutional: Negative for activity change, appetite change, chills, diaphoresis, fatigue and fever.  HENT: Positive for hearing loss. Negative for congestion, trouble swallowing and voice change.   Eyes: Negative for visual disturbance.  Respiratory: Negative for cough, chest tightness, shortness of breath and wheezing.   Cardiovascular: Positive for leg swelling. Negative for chest pain and palpitations.  Gastrointestinal: Negative for abdominal distention, abdominal pain, constipation, diarrhea, nausea and vomiting.  Endocrine: Negative for cold intolerance.  Genitourinary: Negative for difficulty urinating, dysuria, frequency and urgency.  Musculoskeletal: Positive for gait problem. Negative for joint swelling.  Skin: Negative for color change and pallor.  Neurological: Negative for dizziness, tremors, speech difficulty, weakness and headaches.       Memory lapses.   Psychiatric/Behavioral: Negative for agitation, behavioral problems, confusion, hallucinations and sleep disturbance. The patient is not nervous/anxious.     Immunization History  Administered Date(s) Administered  . Influenza Whole 03/30/2012, 04/13/2013  . Influenza-Unspecified 04/17/2014, 03/29/2015  . Pneumococcal Polysaccharide-23 06/30/2001  . Td 06/30/2001   Pertinent  Health Maintenance Due  Topic Date Due  . PNA vac Low Risk Adult (2 of 2 - PCV13) 06/30/2002  . FOOT EXAM  12/21/2015  . OPHTHALMOLOGY EXAM  04/02/2016  . HEMOGLOBIN A1C  08/14/2016  . INFLUENZA VACCINE  07/29/2018 (Originally 01/28/2017)  . DEXA SCAN  Completed   Fall Risk  07/09/2017 11/01/2015  07/26/2015 06/21/2015 02/22/2015  Falls in the past year? No No No No No   Functional Status Survey:    Vitals:   08/12/17 1359  BP: 130/64  Pulse: 72  Resp: 18  Temp: 98.8 F (37.1 C)  SpO2: 97%  Weight: 113 lb (51.3 kg)  Height: 5\' 1"  (1.549 m)   Body mass index is 21.35 kg/m. Physical Exam  Constitutional: She is oriented to person, place, and time. She appears well-developed and well-nourished. No distress.  HENT:  Head: Normocephalic and atraumatic.  Eyes: Conjunctivae and EOM are normal. Pupils are equal, round, and reactive to light.  Neck: Normal range of motion. Neck supple. No JVD present. No thyromegaly present.  Cardiovascular: Normal rate, regular rhythm and normal heart sounds.  No murmur heard. Pulmonary/Chest: Effort normal and breath sounds normal. She has no wheezes. She has no rales.  Abdominal: Soft. Bowel sounds are normal. She exhibits no distension. There is no tenderness. There is no rebound.  Musculoskeletal: Normal range of motion. She exhibits edema. She exhibits no tenderness.  Ambulates with rollator walker, kyphoscoliosis. Trace edema in ankles.   Neurological: She is alert and oriented to person, place, and time. She exhibits normal muscle tone. Coordination normal.  Memory recall issues.   Skin:  Skin is warm and dry. No rash noted. She is not diaphoretic. No erythema.  Psychiatric: She has a normal mood and affect. Her behavior is normal. Judgment and thought content normal.    Labs reviewed: Recent Labs    07/10/17  NA 136*  K 4.3  BUN 16  CREATININE 0.9   Recent Labs    07/10/17  AST 15  ALT 10  ALKPHOS 62   Recent Labs    07/10/17  WBC 10.6  HGB 12.8  HCT 36  PLT 297   Lab Results  Component Value Date   TSH 1.03 02/12/2016   Lab Results  Component Value Date   HGBA1C 6.8 02/12/2016   Lab Results  Component Value Date   CHOL 152 12/12/2014   HDL 56 12/12/2014   LDLCALC 75 12/12/2014   TRIG 105 12/12/2014     Significant Diagnostic Results in last 30 days:  No results found.  Assessment/Plan Essential hypertension Blood pressure is controlled, continue Losartan 50mg  daily.   Acute upper respiratory infection resolved  DM type 2 with diabetic peripheral neuropathy Will check fasting CBG q am ac breakfast, then re-evaluate. Update Hgb a1c.   Generalized weakness Improved since the patient was recovered from acute URI and moved into [facility].   Memory loss Last MMSE 20/30 05/01/16, will update MMSE, TSH     Family/ staff Communication: plan of care reviewed with the patient and charge nurse.   Labs/tests ordered:  MMSE CBG ac breakfast x 1week. Hgb a1c and TSH  Time spend 25 minutes.

## 2017-08-12 NOTE — Assessment & Plan Note (Signed)
Blood pressure is controlled, continue Losartan 50mg  daily.

## 2017-08-12 NOTE — Assessment & Plan Note (Signed)
Improved since the patient was recovered from acute URI and moved into [facility].

## 2017-08-12 NOTE — Assessment & Plan Note (Addendum)
Will check fasting CBG q am ac breakfast, then re-evaluate. Update Hgb a1c.

## 2017-08-12 NOTE — Assessment & Plan Note (Signed)
resolved 

## 2017-08-12 NOTE — Assessment & Plan Note (Addendum)
Last MMSE 20/30 05/01/16, will update MMSE, TSH

## 2017-08-13 DIAGNOSIS — E118 Type 2 diabetes mellitus with unspecified complications: Secondary | ICD-10-CM | POA: Diagnosis not present

## 2017-08-13 DIAGNOSIS — E782 Mixed hyperlipidemia: Secondary | ICD-10-CM | POA: Diagnosis not present

## 2017-08-13 DIAGNOSIS — E0590 Thyrotoxicosis, unspecified without thyrotoxic crisis or storm: Secondary | ICD-10-CM | POA: Diagnosis not present

## 2017-08-13 DIAGNOSIS — D649 Anemia, unspecified: Secondary | ICD-10-CM | POA: Diagnosis not present

## 2017-08-13 DIAGNOSIS — I10 Essential (primary) hypertension: Secondary | ICD-10-CM | POA: Diagnosis not present

## 2017-08-13 LAB — HEMOGLOBIN A1C: Hemoglobin A1C: 7

## 2017-08-13 LAB — TSH: TSH: 0.45 (ref 0.41–5.90)

## 2017-08-14 DIAGNOSIS — M6281 Muscle weakness (generalized): Secondary | ICD-10-CM | POA: Diagnosis not present

## 2017-08-14 DIAGNOSIS — R5383 Other fatigue: Secondary | ICD-10-CM | POA: Diagnosis not present

## 2017-08-14 DIAGNOSIS — R2681 Unsteadiness on feet: Secondary | ICD-10-CM | POA: Diagnosis not present

## 2017-08-14 DIAGNOSIS — M545 Low back pain: Secondary | ICD-10-CM | POA: Diagnosis not present

## 2017-08-14 DIAGNOSIS — R278 Other lack of coordination: Secondary | ICD-10-CM | POA: Diagnosis not present

## 2017-08-14 DIAGNOSIS — Z7389 Other problems related to life management difficulty: Secondary | ICD-10-CM | POA: Diagnosis not present

## 2017-08-17 DIAGNOSIS — Z7389 Other problems related to life management difficulty: Secondary | ICD-10-CM | POA: Diagnosis not present

## 2017-08-17 DIAGNOSIS — R278 Other lack of coordination: Secondary | ICD-10-CM | POA: Diagnosis not present

## 2017-08-17 DIAGNOSIS — M6281 Muscle weakness (generalized): Secondary | ICD-10-CM | POA: Diagnosis not present

## 2017-08-17 DIAGNOSIS — R2681 Unsteadiness on feet: Secondary | ICD-10-CM | POA: Diagnosis not present

## 2017-08-17 DIAGNOSIS — M545 Low back pain: Secondary | ICD-10-CM | POA: Diagnosis not present

## 2017-08-17 DIAGNOSIS — R5383 Other fatigue: Secondary | ICD-10-CM | POA: Diagnosis not present

## 2017-08-20 DIAGNOSIS — M6281 Muscle weakness (generalized): Secondary | ICD-10-CM | POA: Diagnosis not present

## 2017-08-20 DIAGNOSIS — R5383 Other fatigue: Secondary | ICD-10-CM | POA: Diagnosis not present

## 2017-08-20 DIAGNOSIS — R2681 Unsteadiness on feet: Secondary | ICD-10-CM | POA: Diagnosis not present

## 2017-08-20 DIAGNOSIS — R278 Other lack of coordination: Secondary | ICD-10-CM | POA: Diagnosis not present

## 2017-08-20 DIAGNOSIS — Z7389 Other problems related to life management difficulty: Secondary | ICD-10-CM | POA: Diagnosis not present

## 2017-08-20 DIAGNOSIS — M545 Low back pain: Secondary | ICD-10-CM | POA: Diagnosis not present

## 2017-08-24 ENCOUNTER — Non-Acute Institutional Stay: Payer: Medicare Other | Admitting: Nurse Practitioner

## 2017-08-24 DIAGNOSIS — E1142 Type 2 diabetes mellitus with diabetic polyneuropathy: Secondary | ICD-10-CM | POA: Diagnosis not present

## 2017-08-24 DIAGNOSIS — M419 Scoliosis, unspecified: Secondary | ICD-10-CM | POA: Diagnosis not present

## 2017-08-24 DIAGNOSIS — K582 Mixed irritable bowel syndrome: Secondary | ICD-10-CM | POA: Diagnosis not present

## 2017-08-24 DIAGNOSIS — H3530 Unspecified macular degeneration: Secondary | ICD-10-CM | POA: Diagnosis not present

## 2017-08-24 DIAGNOSIS — M6281 Muscle weakness (generalized): Secondary | ICD-10-CM | POA: Diagnosis not present

## 2017-08-24 DIAGNOSIS — R413 Other amnesia: Secondary | ICD-10-CM | POA: Diagnosis not present

## 2017-08-24 DIAGNOSIS — E118 Type 2 diabetes mellitus with unspecified complications: Secondary | ICD-10-CM | POA: Diagnosis not present

## 2017-08-24 DIAGNOSIS — I10 Essential (primary) hypertension: Secondary | ICD-10-CM | POA: Diagnosis not present

## 2017-08-24 DIAGNOSIS — R293 Abnormal posture: Secondary | ICD-10-CM | POA: Diagnosis not present

## 2017-08-24 DIAGNOSIS — K219 Gastro-esophageal reflux disease without esophagitis: Secondary | ICD-10-CM | POA: Diagnosis not present

## 2017-08-24 DIAGNOSIS — M810 Age-related osteoporosis without current pathological fracture: Secondary | ICD-10-CM | POA: Diagnosis not present

## 2017-08-24 DIAGNOSIS — M549 Dorsalgia, unspecified: Secondary | ICD-10-CM | POA: Diagnosis not present

## 2017-08-24 DIAGNOSIS — M5186 Other intervertebral disc disorders, lumbar region: Secondary | ICD-10-CM | POA: Diagnosis not present

## 2017-08-24 DIAGNOSIS — M67419 Ganglion, unspecified shoulder: Secondary | ICD-10-CM | POA: Diagnosis not present

## 2017-08-24 DIAGNOSIS — M4120 Other idiopathic scoliosis, site unspecified: Secondary | ICD-10-CM | POA: Diagnosis not present

## 2017-08-24 DIAGNOSIS — M859 Disorder of bone density and structure, unspecified: Secondary | ICD-10-CM | POA: Diagnosis not present

## 2017-08-24 DIAGNOSIS — R32 Unspecified urinary incontinence: Secondary | ICD-10-CM | POA: Diagnosis not present

## 2017-08-24 DIAGNOSIS — M545 Low back pain: Secondary | ICD-10-CM | POA: Diagnosis not present

## 2017-08-24 DIAGNOSIS — R29818 Other symptoms and signs involving the nervous system: Secondary | ICD-10-CM | POA: Diagnosis not present

## 2017-08-24 DIAGNOSIS — R2681 Unsteadiness on feet: Secondary | ICD-10-CM | POA: Diagnosis not present

## 2017-08-24 DIAGNOSIS — R29898 Other symptoms and signs involving the musculoskeletal system: Secondary | ICD-10-CM | POA: Diagnosis not present

## 2017-08-24 DIAGNOSIS — E0590 Thyrotoxicosis, unspecified without thyrotoxic crisis or storm: Secondary | ICD-10-CM | POA: Diagnosis not present

## 2017-08-24 NOTE — Assessment & Plan Note (Signed)
elevted blood sugar, history of diet controlled T2DM, fasting CBG in am showed 137, 148, 140, 129, 150, 135, last Hgb a1c 7.0 08/13/17. The patient admitted her consumption of cookies has been increased recently. The patient desires to better control her diet, willing to exercise mores, f/u Hgb a1c in 3 months, then re-evaluate.

## 2017-08-24 NOTE — Assessment & Plan Note (Signed)
Blood pressure is controlled, continue Losartan 50mg  daily.

## 2017-08-24 NOTE — Progress Notes (Signed)
Location:  [facility] Room Number: 366 Place of Service:  ALF [phone]) Provider:  Lameshia Hypolite, Manxie  NP  Blanchie Serve, MD  Patient Care Team: Blanchie Serve, MD as PCP - General (Internal Medicine) [place] Alnisa Hasley X, NP as Nurse Practitioner (Nurse Practitioner) Jacelyn Pi, MD as Consulting Physician (Endocrinology) Calvert Cantor, MD as Consulting Physician (Ophthalmology) Vickey Huger, MD as Consulting Physician (Orthopedic Surgery) Suella Broad, MD as Consulting Physician (Physical Medicine and Rehabilitation) Erline Levine, MD as Consulting Physician (Neurosurgery) Juanita Craver, MD as Consulting Physician (Gastroenterology) Martinique, Peter M, MD as Consulting Physician (Cardiology) Megan Salon, MD as Consulting Physician (Gynecology)  Extended Emergency Contact Information Primary Emergency Contact: [name], [place] Phone: [phone] Relation: Friend  Code Status:  DNR Goals of care: Advanced Directive information Advanced Directives 08/25/2017  Does Patient Have a Medical Advance Directive? Yes  Type of Paramedic of [place];Living will  Does patient want to make changes to medical advance directive? No - Patient declined  Copy of [place] in Chart? Yes  Would patient like information on creating a medical advance directive? -     Chief Complaint  Patient presents with  . Acute Visit    Elevated blood sugar    HPI:  Pt is a 84 y.o. female seen today for an acute visit for elevted blood sugar, history of diet controlled T2DM, fasting CBG in am showed 137, 148, 140, 129, 150, 135, last Hgb a1c 7.0 08/13/17. The patient admitted her consumption of cookies has been increased recently. Hx of HTN, blood pressure is controlled on Losartan 50mg  qd.  Past Medical History:  Diagnosis Date  . Contact dermatitis and other eczema, due to  unspecified cause   . Cortical senile cataract   . GERD (gastroesophageal reflux disease) 02/17/2013  . Hyperparathyroidism, unspecified ([place]) 03/26/2010  . Irritable bowel syndrome   . Keratoconjunctivitis sicca, not specified as Sjogren's   . Loss of weight   . Macular degeneration (senile) of retina, unspecified   . Memory loss   . Other and unspecified hyperlipidemia   . Other malaise and fatigue 06/17/2010  . Pain in joint, site unspecified   . Pathologic fracture of vertebrae   . Reflux esophagitis   . Scoliosis (and kyphoscoliosis), idiopathic   . Senile osteoporosis   . Type II or unspecified type diabetes mellitus without mention of complication, uncontrolled   . Unspecified essential hypertension   . Unspecified hereditary and idiopathic peripheral neuropathy   . Unspecified vitamin D deficiency    Past Surgical History:  Procedure Laterality Date  . APPENDECTOMY    . BREAST LUMPECTOMY  02/05/2010   Dr. Christie Beckers  . CATARACT EXTRACTION W/ INTRAOCULAR LENS  IMPLANT, BILATERAL  04/2013   Dr. Bing Plume  . CHOLECYSTECTOMY  2004   Dr. Zella Richer  . COLONOSCOPY  2001   Dr. Collene Mares  . fibroidectomy  1950  . [place]  . KYPHOPLASTY  2010   T10 & T12 Dr. Erline Levine  . MEDIAL PARTIAL KNEE REPLACEMENT Left 2004   Dr. Ronnie Derby  . SPINE SURGERY     vertebroplasty T10, T12  . SPINE SURGERY  11/02/2001    C4-5 & C5-6 Titanium Plate Placed Dr. Carloyn Manner  . THYROIDECTOMY, PARTIAL Left 2007  . TOTAL KNEE ARTHROPLASTY Right 2009   Dr. Alvan Dame     Allergies  Allergen  Reactions  . Bactrim     unknown  . Betadine [Povidone Iodine]     unknown  . Metformin And Related     Hurt stomach  . Nutmeg Oil (Myristica Oil)     unknown  . Sulfa Antibiotics     unknown    Outpatient Encounter Medications as of 08/24/2017  Medication Sig  . glucose blood (TRUE METRIX BLOOD GLUCOSE TEST) test strip Use to check sugar twice a day. Dx:E11.42  . losartan (COZAAR) 50 MG tablet TAKE 1  TABLET ONCE DAILY TO CONTROL BLOOD PRESSURE.  . Multiple Vitamins-Minerals (ICAPS) TABS Take 1 tablet by mouth daily.  . TRUEPLUS LANCETS 28G MISC CHECK BLOOD SUGAR TWICE DAILY.   No facility-administered encounter medications on file as of 08/24/2017.     Review of Systems  Constitutional: Negative for activity change, appetite change, chills, diaphoresis, fatigue and fever.  Respiratory: Negative for cough, chest tightness, shortness of breath and wheezing.   Cardiovascular: Positive for leg swelling. Negative for chest pain and palpitations.  Gastrointestinal: Negative for abdominal distention and abdominal pain.  Musculoskeletal: Positive for gait problem. Negative for back pain and joint swelling.  Skin: Negative for color change and pallor.  Neurological: Negative for dizziness, weakness, numbness and headaches.  Psychiatric/Behavioral: Negative for agitation, behavioral problems, confusion, hallucinations and sleep disturbance. The patient is not nervous/anxious.     Immunization History  Administered Date(s) Administered  . Influenza Whole 03/30/2012, 04/13/2013  . Influenza-Unspecified 04/17/2014, 03/29/2015  . Pneumococcal Polysaccharide-23 06/30/2001  . Td 06/30/2001   Pertinent  Health Maintenance Due  Topic Date Due  . PNA vac Low Risk Adult (2 of 2 - PCV13) 06/30/2002  . FOOT EXAM  12/21/2015  . OPHTHALMOLOGY EXAM  04/02/2016  . HEMOGLOBIN A1C  08/14/2016  . INFLUENZA VACCINE  07/29/2018 (Originally 01/28/2017)  . DEXA SCAN  Completed   Fall Risk  07/09/2017 11/01/2015 07/26/2015 06/21/2015 02/22/2015  Falls in the past year? No No No No No   Functional Status Survey:    Vitals:   08/24/17 1130  BP: 138/76  Pulse: 76  Resp: 18  Temp: 98.9 F (37.2 C)  Weight: 112 lb 8 oz (51 kg)  Height: 5\' 1"  (1.549 m)   Body mass index is 21.26 kg/m. Physical Exam  Constitutional: She is oriented to person, place, and time. She appears well-developed and well-nourished.    HENT:  Head: Normocephalic and atraumatic.  Eyes: Conjunctivae and EOM are normal. Pupils are equal, round, and reactive to light.  Neck: Normal range of motion. Neck supple. No JVD present. No thyromegaly present.  Cardiovascular: Normal rate, regular rhythm and normal heart sounds.  No murmur heard. Pulmonary/Chest: Effort normal and breath sounds normal. She has no wheezes. She has no rales.  Abdominal: Soft. Bowel sounds are normal.  Musculoskeletal: She exhibits edema.  Trace edema in ankles.   Neurological: She is alert and oriented to person, place, and time. She exhibits normal muscle tone. Coordination normal.  Skin: Skin is warm and dry.  Psychiatric: She has a normal mood and affect. Her behavior is normal. Judgment and thought content normal.    Labs reviewed: Recent Labs    07/10/17  NA 136*  K 4.3  BUN 16  CREATININE 0.9   Recent Labs    07/10/17  AST 15  ALT 10  ALKPHOS 62   Recent Labs    07/10/17  WBC 10.6  HGB 12.8  HCT 36  PLT 297   Lab Results  Component Value Date   TSH 1.03 02/12/2016   Lab Results  Component Value Date   HGBA1C 6.8 02/12/2016   Lab Results  Component Value Date   CHOL 152 12/12/2014   HDL 56 12/12/2014   LDLCALC 75 12/12/2014   TRIG 105 12/12/2014    Significant Diagnostic Results in last 30 days:  No results found.  Assessment/Plan DM type 2 with diabetic peripheral neuropathy (HCC) elevted blood sugar, history of diet controlled T2DM, fasting CBG in am showed 137, 148, 140, 129, 150, 135, last Hgb a1c 7.0 08/13/17. The patient admitted her consumption of cookies has been increased recently. The patient desires to better control her diet, willing to exercise mores, f/u Hgb a1c in 3 months, then re-evaluate.   Essential hypertension Blood pressure is controlled, continue Losartan 50mg  daily.      Family/ staff Communication: plan of care reviewed with the patient and charge nurse.   Labs/tests ordered: Hgb a1c  3 months.   Time spend 25 minutes

## 2017-08-25 ENCOUNTER — Encounter: Payer: Self-pay | Admitting: Nurse Practitioner

## 2017-11-05 ENCOUNTER — Encounter: Payer: Self-pay | Admitting: Internal Medicine

## 2017-11-05 ENCOUNTER — Non-Acute Institutional Stay: Payer: Medicare Other | Admitting: Internal Medicine

## 2017-11-05 DIAGNOSIS — R2681 Unsteadiness on feet: Secondary | ICD-10-CM | POA: Diagnosis not present

## 2017-11-05 DIAGNOSIS — I10 Essential (primary) hypertension: Secondary | ICD-10-CM

## 2017-11-05 DIAGNOSIS — E440 Moderate protein-calorie malnutrition: Secondary | ICD-10-CM | POA: Diagnosis not present

## 2017-11-05 DIAGNOSIS — E1142 Type 2 diabetes mellitus with diabetic polyneuropathy: Secondary | ICD-10-CM | POA: Diagnosis not present

## 2017-11-05 DIAGNOSIS — M159 Polyosteoarthritis, unspecified: Secondary | ICD-10-CM | POA: Diagnosis not present

## 2017-11-05 DIAGNOSIS — R413 Other amnesia: Secondary | ICD-10-CM | POA: Diagnosis not present

## 2017-11-05 NOTE — Progress Notes (Signed)
Location:  [facility] Room Number: 546 Place of Service:  ALF [phone]) Provider:  Blanchie Serve MD  Blanchie Serve, MD  Patient Care Team: Blanchie Serve, MD as PCP - General (Internal Medicine) [place] Mast, Man X, NP as Nurse Practitioner (Nurse Practitioner) Jacelyn Pi, MD as Consulting Physician (Endocrinology) Calvert Cantor, MD as Consulting Physician (Ophthalmology) Vickey Huger, MD as Consulting Physician (Orthopedic Surgery) Suella Broad, MD as Consulting Physician (Physical Medicine and Rehabilitation) Erline Levine, MD as Consulting Physician (Neurosurgery) Juanita Craver, MD as Consulting Physician (Gastroenterology) Martinique, Peter M, MD as Consulting Physician (Cardiology) Megan Salon, MD as Consulting Physician (Gynecology)  Extended Emergency Contact Information Primary Emergency Contact: [name], [place] Phone: [phone] Relation: Friend  Code Status:  Full code  Goals of care: Advanced Directive information Advanced Directives 11/05/2017  Does Patient Have a Medical Advance Directive? Yes  Type of Advance Directive Living will  Does patient want to make changes to medical advance directive? No - Patient declined  Copy of [place] in Chart? -  Would patient like information on creating a medical advance directive? -     Chief Complaint  Patient presents with  . Medical Management of Chronic Issues    Routine Visit     HPI:  Pt is a 84 y.o. female seen today for medical management of chronic diseases. She has been at her baseline per nursing with no acute concern. She is seen in her room today. She ambulates with her walker. No fall reported. She is continent with her bowel and bladder. Nurses assist in medication administration. BP reading have been stable on review. Takes losartan 50 mg daily   Past Medical History:  Diagnosis Date  .  Contact dermatitis and other eczema, due to unspecified cause   . Cortical senile cataract   . GERD (gastroesophageal reflux disease) 02/17/2013  . Hyperparathyroidism, unspecified ([place]) 03/26/2010  . Irritable bowel syndrome   . Keratoconjunctivitis sicca, not specified as Sjogren's   . Loss of weight   . Macular degeneration (senile) of retina, unspecified   . Memory loss   . Other and unspecified hyperlipidemia   . Other malaise and fatigue 06/17/2010  . Pain in joint, site unspecified   . Pathologic fracture of vertebrae   . Reflux esophagitis   . Scoliosis (and kyphoscoliosis), idiopathic   . Senile osteoporosis   . Type II or unspecified type diabetes mellitus without mention of complication, uncontrolled   . Unspecified essential hypertension   . Unspecified hereditary and idiopathic peripheral neuropathy   . Unspecified vitamin D deficiency    Past Surgical History:  Procedure Laterality Date  . APPENDECTOMY    . BREAST LUMPECTOMY  02/05/2010   Dr. Christie Beckers  . CATARACT EXTRACTION W/ INTRAOCULAR LENS  IMPLANT, BILATERAL  04/2013   Dr. Bing Plume  . CHOLECYSTECTOMY  2004   Dr. Zella Richer  . COLONOSCOPY  2001   Dr. Collene Mares  . fibroidectomy  1950  . [place]  . KYPHOPLASTY  2010   T10 & T12 Dr. Erline Levine  . MEDIAL PARTIAL KNEE REPLACEMENT Left 2004   Dr. Ronnie Derby  . SPINE SURGERY     vertebroplasty T10, T12  . SPINE SURGERY  11/02/2001    C4-5 & C5-6 Titanium Plate Placed Dr. Carloyn Manner  . THYROIDECTOMY, PARTIAL Left 2007  . TOTAL KNEE ARTHROPLASTY Right 2009  Dr. Alvan Dame     Allergies  Allergen Reactions  . Bactrim     unknown  . Betadine [Povidone Iodine]     unknown  . Metformin And Related     Hurt stomach  . Nutmeg Oil (Myristica Oil)     unknown  . Sulfa Antibiotics     unknown    Outpatient Encounter Medications as of 11/05/2017  Medication Sig  . carboxymethylcellul-glycerin (OPTIVE) 0.5-0.9 % ophthalmic solution Place 2 drops into both eyes every  8 (eight) hours as needed for dry eyes.  Marland Kitchen glucose blood (TRUE METRIX BLOOD GLUCOSE TEST) test strip Use to check sugar twice a day. Dx:E11.42  . losartan (COZAAR) 50 MG tablet TAKE 1 TABLET ONCE DAILY TO CONTROL BLOOD PRESSURE.  . Multiple Vitamins-Minerals (ICAPS) TABS Take 1 tablet by mouth daily.  . TRUEPLUS LANCETS 28G MISC CHECK BLOOD SUGAR TWICE DAILY.   No facility-administered encounter medications on file as of 11/05/2017.     Review of Systems  Constitutional: Negative for appetite change, chills, fatigue and fever.  HENT: Negative for congestion, ear discharge, ear pain, hearing loss, postnasal drip, rhinorrhea and sore throat.   Eyes: Positive for visual disturbance. Negative for pain and discharge.  Respiratory: Negative for cough, shortness of breath and wheezing.   Cardiovascular: Positive for leg swelling. Negative for chest pain and palpitations.       Chronic ankle edema left > right  Gastrointestinal: Negative for abdominal pain, constipation, diarrhea, nausea and vomiting.       Had a bowel movement this am  Genitourinary: Negative for dysuria, frequency and hematuria.  Musculoskeletal: Positive for arthralgias and gait problem. Negative for back pain.       S/p bilateral knee replacement, occasional knee pain   Skin: Negative for rash.  Neurological: Negative for dizziness, weakness, numbness and headaches.  Psychiatric/Behavioral: Negative for behavioral problems, hallucinations and sleep disturbance.    Immunization History  Administered Date(s) Administered  . Influenza Whole 03/30/2012, 04/13/2013  . Influenza-Unspecified 04/17/2014, 03/29/2015  . Pneumococcal Polysaccharide-23 06/30/2001  . Td 06/30/2001   Pertinent  Health Maintenance Due  Topic Date Due  . PNA vac Low Risk Adult (2 of 2 - PCV13) 06/30/2002  . FOOT EXAM  12/21/2015  . OPHTHALMOLOGY EXAM  04/02/2016  . HEMOGLOBIN A1C  08/14/2016  . INFLUENZA VACCINE  07/29/2018 (Originally 01/28/2018)  .  DEXA SCAN  Completed   Fall Risk  07/09/2017 11/01/2015 07/26/2015 06/21/2015 02/22/2015  Falls in the past year? No No No No No   Functional Status Survey:    Vitals:   11/05/17 0954  BP: 136/68  Pulse: 78  Resp: 18  Temp: 98.8 F (37.1 C)  TempSrc: Oral  SpO2: 97%  Weight: 112 lb 8 oz (51 kg)  Height: 5\' 3"  (1.6 m)   Body mass index is 19.93 kg/m.   Wt Readings from Last 3 Encounters:  11/05/17 112 lb 8 oz (51 kg)  08/24/17 112 lb 8 oz (51 kg)  08/12/17 113 lb (51.3 kg)   Physical Exam  Constitutional: She is oriented to person, place, and time. She appears well-developed. No distress.  Thin built and frail elderly female  HENT:  Head: Normocephalic and atraumatic.  Right Ear: External ear normal.  Left Ear: External ear normal.  Nose: Nose normal.  Mouth/Throat: Oropharynx is clear and moist. No oropharyngeal exudate.  Has geographic tongue  Eyes: Pupils are equal, round, and reactive to light. Conjunctivae and EOM are normal. Right eye exhibits  no discharge. Left eye exhibits no discharge. No scleral icterus.  Neck: Normal range of motion. Neck supple.  Cardiovascular: Normal rate, regular rhythm and intact distal pulses.  No murmur heard. Pulmonary/Chest: Effort normal and breath sounds normal. No respiratory distress. She has no wheezes. She has no rales.  Abdominal: Soft. Bowel sounds are normal. She exhibits no mass. There is no tenderness. There is no guarding.  Musculoskeletal: Normal range of motion. She exhibits edema. She exhibits no tenderness.  Able to move all 4 extremities. Edema around her ankle left > right  Lymphadenopathy:    She has no cervical adenopathy.  Neurological: She is alert and oriented to person, place, and time. No cranial nerve deficit or sensory deficit. She exhibits normal muscle tone.  05/01/16 MMSE 29/30   Skin: Skin is warm and dry. She is not diaphoretic. No erythema. No pallor.    Labs reviewed: Recent Labs    07/10/17  NA  136*  K 4.3  BUN 16  CREATININE 0.9   Recent Labs    07/10/17  AST 15  ALT 10  ALKPHOS 62   Recent Labs    07/10/17  WBC 10.6  HGB 12.8  HCT 36  PLT 297   Lab Results  Component Value Date   TSH 0.45 08/13/2017   Lab Results  Component Value Date   HGBA1C 7.0 08/13/2017   Lab Results  Component Value Date   CHOL 152 12/12/2014   HDL 56 12/12/2014   LDLCALC 75 12/12/2014   TRIG 105 12/12/2014    Significant Diagnostic Results in last 30 days:  No results found.  Assessment/Plan  1. Essential hypertension Controlled BP reading. Continue losartan 50 mg daily, monitor renal function periodically.   2. Memory loss TSH stable, check b12. Obtain MMSE. Supportive care for now. MMSE 05/01/16 29/30  3. Osteoarthritis of multiple joints, unspecified osteoarthritis type Denies pain this visit. Add tylenol 650 mg q12h prn pain and monitor  4. Moderate protein-calorie malnutrition (HCC) Monitor po intake and weight. Check prealbumin level and if low, obtain RD consult to evaluate for protein supplement with low BMI  5. DM type 2 with diabetic peripheral neuropathy [facility]) Lab Results  Component Value Date   HGBA1C 7.0 08/13/2017   Recheck a1c 11/17/17. With her age, will not start treatment until of a1c > 7.5. Monitor for fall with history of neuropathy. Urine negative for microalbuminuria 02/12/16. Check lipid panel. No blood sugar reading available for review.   6. Unsteady gait Continue walker for ambulation. Fall precautions   Family/ staff Communication: reviewed care plan with patient and charge nurse.    Labs/tests ordered:  cmp with gfr, a1c, lipid 11/17/17   Blanchie Serve, MD Internal Medicine [facility] [address] [place], [place] [address] Cell Phone (Monday-Friday 8 am - 5 pm): [phone] On Call: [phone] and follow prompts after 5 pm and on weekends Office Phone: [phone] Office Fax:  [phone]

## 2017-11-09 ENCOUNTER — Encounter: Payer: Self-pay | Admitting: *Deleted

## 2017-11-19 DIAGNOSIS — E1142 Type 2 diabetes mellitus with diabetic polyneuropathy: Secondary | ICD-10-CM | POA: Diagnosis not present

## 2017-11-19 DIAGNOSIS — I10 Essential (primary) hypertension: Secondary | ICD-10-CM | POA: Diagnosis not present

## 2017-11-19 LAB — LIPID PANEL
Cholesterol: 198 (ref 0–200)
HDL: 78 — AB (ref 35–70)
LDL Cholesterol: 106
LDl/HDL Ratio: 2.5
Triglycerides: 53 (ref 40–160)

## 2017-11-19 LAB — BASIC METABOLIC PANEL
BUN: 18 (ref 4–21)
Creatinine: 0.8 (ref <=1.1)
Glucose: 133
Potassium: 4.3 (ref 3.4–5.3)
Sodium: 140 (ref 137–147)

## 2017-11-19 LAB — HEPATIC FUNCTION PANEL
ALT: 16 (ref 7–35)
AST: 21 (ref 13–35)
Alkaline Phosphatase: 65 (ref 25–125)
Bilirubin, Total: 0.8

## 2017-11-19 LAB — HEMOGLOBIN A1C: Hemoglobin A1C: 7.5

## 2017-11-20 ENCOUNTER — Other Ambulatory Visit: Payer: Self-pay | Admitting: *Deleted

## 2017-11-20 LAB — COMPREHENSIVE METABOLIC PANEL
Albumin: 4.2
Calcium: 11.3
Carbon Dioxide, Total: 30
Chloride: 104
Globulin: 2.1
Protein: 6.3

## 2017-11-24 ENCOUNTER — Non-Acute Institutional Stay: Payer: Medicare Other

## 2017-11-24 DIAGNOSIS — Z0000 Encounter for general adult medical examination without abnormal findings: Secondary | ICD-10-CM | POA: Diagnosis not present

## 2017-11-24 NOTE — Patient Instructions (Signed)
[name] , Thank you for taking time to come for your Medicare Wellness Visit. I appreciate your ongoing commitment to your health goals. Please review the following plan we discussed and let me know if I can assist you in the future.   Screening recommendations/referrals: Colonoscopy excluded, over age 82 Mammogram excluded, over age 37 Bone Density up to date Recommended yearly ophthalmology/optometry visit for glaucoma screening and checkup Recommended yearly dental visit for hygiene and checkup  Vaccinations: Influenza vaccine due 2019 fall season Pneumococcal vaccine 13 due, ordered Tdap vaccine due, ordered Shingles vaccine not in records    Advanced directives: In chart  Conditions/risks identified: none  Next appointment: Dr. Bubba Camp makes rounds   Preventive Care 44 Years and Older, Female Preventive care refers to lifestyle choices and visits with your health care provider that can promote health and wellness. What does preventive care include?  A yearly physical exam. This is also called an annual well check.  Dental exams once or twice a year.  Routine eye exams. Ask your health care provider how often you should have your eyes checked.  Personal lifestyle choices, including:  Daily care of your teeth and gums.  Regular physical activity.  Eating a healthy diet.  Avoiding tobacco and drug use.  Limiting alcohol use.  Practicing safe sex.  Taking low-dose aspirin every day.  Taking vitamin and mineral supplements as recommended by your health care provider. What happens during an annual well check? The services and screenings done by your health care provider during your annual well check will depend on your age, overall health, lifestyle risk factors, and family history of disease. Counseling  Your health care provider may ask you questions about your:  Alcohol use.  Tobacco use.  Drug use.  Emotional well-being.  Home and relationship  well-being.  Sexual activity.  Eating habits.  History of falls.  Memory and ability to understand (cognition).  Work and work Statistician.  Reproductive health. Screening  You may have the following tests or measurements:  Height, weight, and BMI.  Blood pressure.  Lipid and cholesterol levels. These may be checked every 5 years, or more frequently if you are over 79 years old.  Skin check.  Lung cancer screening. You may have this screening every year starting at age 6 if you have a 30-pack-year history of smoking and currently smoke or have quit within the past 15 years.  Fecal occult blood test (FOBT) of the stool. You may have this test every year starting at age 43.  Flexible sigmoidoscopy or colonoscopy. You may have a sigmoidoscopy every 5 years or a colonoscopy every 10 years starting at age 40.  Hepatitis C blood test.  Hepatitis B blood test.  Sexually transmitted disease (STD) testing.  Diabetes screening. This is done by checking your blood sugar (glucose) after you have not eaten for a while (fasting). You may have this done every 1-3 years.  Bone density scan. This is done to screen for osteoporosis. You may have this done starting at age 30.  Mammogram. This may be done every 1-2 years. Talk to your health care provider about how often you should have regular mammograms. Talk with your health care provider about your test results, treatment options, and if necessary, the need for more tests. Vaccines  Your health care provider may recommend certain vaccines, such as:  Influenza vaccine. This is recommended every year.  Tetanus, diphtheria, and acellular pertussis (Tdap, Td) vaccine. You may need a Td booster  every 10 years.  Zoster vaccine. You may need this after age 22.  Pneumococcal 13-valent conjugate (PCV13) vaccine. One dose is recommended after age 14.  Pneumococcal polysaccharide (PPSV23) vaccine. One dose is recommended after age  75. Talk to your health care provider about which screenings and vaccines you need and how often you need them. This information is not intended to replace advice given to you by your health care provider. Make sure you discuss any questions you have with your health care provider. Document Released: 07/13/2015 Document Revised: 03/05/2016 Document Reviewed: 04/17/2015 Elsevier Interactive Patient Education  2017 [place] Prevention in the Home Falls can cause injuries. They can happen to people of all ages. There are many things you can do to make your home safe and to help prevent falls. What can I do on the outside of my home?  Regularly fix the edges of walkways and driveways and fix any cracks.  Remove anything that might make you trip as you walk through a door, such as a raised step or threshold.  Trim any bushes or trees on the path to your home.  Use bright outdoor lighting.  Clear any walking paths of anything that might make someone trip, such as rocks or tools.  Regularly check to see if handrails are loose or broken. Make sure that both sides of any steps have handrails.  Any raised decks and porches should have guardrails on the edges.  Have any leaves, snow, or ice cleared regularly.  Use sand or salt on walking paths during winter.  Clean up any spills in your garage right away. This includes oil or grease spills. What can I do in the bathroom?  Use night lights.  Install grab bars by the toilet and in the tub and shower. Do not use towel bars as grab bars.  Use non-skid mats or decals in the tub or shower.  If you need to sit down in the shower, use a plastic, non-slip stool.  Keep the floor dry. Clean up any water that spills on the floor as soon as it happens.  Remove soap buildup in the tub or shower regularly.  Attach bath mats securely with double-sided non-slip rug tape.  Do not have throw rugs and other things on the floor that can make  you trip. What can I do in the bedroom?  Use night lights.  Make sure that you have a light by your bed that is easy to reach.  Do not use any sheets or blankets that are too big for your bed. They should not hang down onto the floor.  Have a firm chair that has side arms. You can use this for support while you get dressed.  Do not have throw rugs and other things on the floor that can make you trip. What can I do in the kitchen?  Clean up any spills right away.  Avoid walking on wet floors.  Keep items that you use a lot in easy-to-reach places.  If you need to reach something above you, use a strong step stool that has a grab bar.  Keep electrical cords out of the way.  Do not use floor polish or wax that makes floors slippery. If you must use wax, use non-skid floor wax.  Do not have throw rugs and other things on the floor that can make you trip. What can I do with my stairs?  Do not leave any items on the stairs.  Make  sure that there are handrails on both sides of the stairs and use them. Fix handrails that are broken or loose. Make sure that handrails are as long as the stairways.  Check any carpeting to make sure that it is firmly attached to the stairs. Fix any carpet that is loose or worn.  Avoid having throw rugs at the top or bottom of the stairs. If you do have throw rugs, attach them to the floor with carpet tape.  Make sure that you have a light switch at the top of the stairs and the bottom of the stairs. If you do not have them, ask someone to add them for you. What else can I do to help prevent falls?  Wear shoes that:  Do not have high heels.  Have rubber bottoms.  Are comfortable and fit you well.  Are closed at the toe. Do not wear sandals.  If you use a stepladder:  Make sure that it is fully opened. Do not climb a closed stepladder.  Make sure that both sides of the stepladder are locked into place.  Ask someone to hold it for you, if  possible.  Clearly mark and make sure that you can see:  Any grab bars or handrails.  First and last steps.  Where the edge of each step is.  Use tools that help you move around (mobility aids) if they are needed. These include:  Canes.  Walkers.  Scooters.  Crutches.  Turn on the lights when you go into a dark area. Replace any light bulbs as soon as they burn out.  Set up your furniture so you have a clear path. Avoid moving your furniture around.  If any of your floors are uneven, fix them.  If there are any pets around you, be aware of where they are.  Review your medicines with your doctor. Some medicines can make you feel dizzy. This can increase your chance of falling. Ask your doctor what other things that you can do to help prevent falls. This information is not intended to replace advice given to you by your health care provider. Make sure you discuss any questions you have with your health care provider. Document Released: 04/12/2009 Document Revised: 11/22/2015 Document Reviewed: 07/21/2014 Elsevier Interactive Patient Education  2017 [employer].

## 2017-11-24 NOTE — Progress Notes (Signed)
Subjective:   [name] is a 84 y.o. female who presents for an Initial Medicare Annual Wellness Visit at [facility]       Objective:    Today's Vitals   11/24/17 1102  BP: 135/65  Pulse: 77  Temp: 98.6 F (37 C)  TempSrc: Oral  Weight: 112 lb (50.8 kg)  Height: 5\' 3"  (1.6 m)   Body mass index is 19.84 kg/m.  Advanced Directives 11/24/2017 11/05/2017 08/25/2017 07/09/2017 03/05/2017 03/05/2017 01/29/2017  Does Patient Have a Medical Advance Directive? Yes Yes Yes Yes Yes Yes Yes  Type of Advance Directive Living will Living will [place];Living will [place];Living will [place];Living will - [place];Living will  Does patient want to make changes to medical advance directive? No - Patient declined No - Patient declined No - Patient declined No - Patient declined No - Patient declined - No - Patient declined  Copy of [place] in Chart? - - Yes Yes - - Yes  Would patient like information on creating a medical advance directive? - - - - - - -    Current Medications (verified) Outpatient Encounter Medications as of 11/24/2017  Medication Sig  . carboxymethylcellul-glycerin (OPTIVE) 0.5-0.9 % ophthalmic solution Place 2 drops into both eyes every 8 (eight) hours as needed for dry eyes.  Marland Kitchen glucose blood (TRUE METRIX BLOOD GLUCOSE TEST) test strip Use to check sugar twice a day. Dx:E11.42  . losartan (COZAAR) 50 MG tablet TAKE 1 TABLET ONCE DAILY TO CONTROL BLOOD PRESSURE.  . Multiple Vitamins-Minerals (ICAPS) TABS Take 1 tablet by mouth daily.  . TRUEPLUS LANCETS 28G MISC CHECK BLOOD SUGAR TWICE DAILY.   No facility-administered encounter medications on file as of 11/24/2017.     Allergies (verified) Bactrim; Betadine [povidone iodine]; Metformin and related; Nutmeg oil (myristica oil); and Sulfa antibiotics   History: Past Medical History:  Diagnosis Date   . Contact dermatitis and other eczema, due to unspecified cause   . Cortical senile cataract   . GERD (gastroesophageal reflux disease) 02/17/2013  . Hyperparathyroidism, unspecified ([place]) 03/26/2010  . Irritable bowel syndrome   . Keratoconjunctivitis sicca, not specified as Sjogren's   . Loss of weight   . Macular degeneration (senile) of retina, unspecified   . Memory loss   . Other and unspecified hyperlipidemia   . Other malaise and fatigue 06/17/2010  . Pain in joint, site unspecified   . Pathologic fracture of vertebrae   . Reflux esophagitis   . Scoliosis (and kyphoscoliosis), idiopathic   . Senile osteoporosis   . Type II or unspecified type diabetes mellitus without mention of complication, uncontrolled   . Unspecified essential hypertension   . Unspecified hereditary and idiopathic peripheral neuropathy   . Unspecified vitamin D deficiency    Past Surgical History:  Procedure Laterality Date  . APPENDECTOMY    . BREAST LUMPECTOMY  02/05/2010   Dr. Christie Beckers  . CATARACT EXTRACTION W/ INTRAOCULAR LENS  IMPLANT, BILATERAL  04/2013   Dr. Bing Plume  . CHOLECYSTECTOMY  2004   Dr. Zella Richer  . COLONOSCOPY  2001   Dr. Collene Mares  . fibroidectomy  1950  . [place]  . KYPHOPLASTY  2010   T10 & T12 Dr. Erline Levine  . MEDIAL PARTIAL KNEE REPLACEMENT Left 2004   Dr. Ronnie Derby  . SPINE SURGERY     vertebroplasty T10, T12  . SPINE SURGERY  11/02/2001    C4-5 & C5-6 Titanium Plate Placed Dr. Carloyn Manner  . THYROIDECTOMY, PARTIAL Left 2007  . TOTAL KNEE ARTHROPLASTY Right 2009   Dr. Alvan Dame    Family History  Problem Relation Age of Onset  . Diabetes Mother   . Heart disease Mother        CHF  . Stroke Father   . Heart disease Sister        MI  . Colon cancer Neg Hx   . Stomach cancer Neg Hx   . Esophageal cancer Neg Hx   . Rectal cancer Neg Hx   . Liver cancer Neg Hx    Social History   Socioeconomic History  . Marital status: Widowed    Spouse name: Not on file  .  Number of children: 0  . Years of education: Not on file  . Highest education level: Not on file  Occupational History  . Occupation: retired Marketing executive: RETIRED  Social Needs  . Financial resource strain: Not hard at all  . Food insecurity:    Worry: Never true    Inability: Never true  . Transportation needs:    Medical: No    Non-medical: No  Tobacco Use  . Smoking status: Never Smoker  . Smokeless tobacco: Never Used  Substance and Sexual Activity  . Alcohol use: No  . Drug use: No  . Sexual activity: Never    Partners: Male    Birth control/protection: Post-menopausal  Lifestyle  . Physical activity:    Days per week: 7 days    Minutes per session: 20 min  . Stress: Only a little  Relationships  . Social connections:    Talks on phone: More than three times a week    Gets together: More than three times a week    Attends religious service: Never    Active member of club or organization: No    Attends meetings of clubs or organizations: Never    Relationship status: Widowed  Other Topics Concern  . Not on file  Social History Narrative   Lives at [facility] since 02/06/2009   Widowed    No childred   Living Will    Tobacco Counseling Counseling given: Not Answered   Clinical Intake:  Pre-visit preparation completed: No  Pain : No/denies pain     Nutritional Risks: None Diabetes: No  How often do you need to have someone help you when you read instructions, pamphlets, or other written materials from your doctor or pharmacy?: 1 - Never What is the last grade level you completed in school?: [employer]  Interpreter Needed?: No  Information entered by :: Tyson Dense, RN   Activities of Daily Living In your present state of health, do you have any difficulty performing the following activities: 11/24/2017  Hearing? N  Vision? N  Difficulty concentrating or making decisions? Y  Walking or climbing stairs? Y    Dressing or bathing? N  Doing errands, shopping? N  Preparing Food and eating ? N  Using the Toilet? N  In the past six months, have you accidently leaked urine? N  Do you have problems with loss of bowel control? N  Managing your Medications? Y  Managing your Finances? Y  Housekeeping or managing your Housekeeping? Y  Some recent data might be hidden     Immunizations and Health Maintenance Immunization History  Administered Date(s) Administered  . Influenza Whole 03/30/2012, 04/13/2013  .  Influenza-Unspecified 04/17/2014, 03/29/2015  . Pneumococcal Polysaccharide-23 06/30/2001  . Td 06/30/2001   Health Maintenance Due  Topic Date Due  . PNA vac Low Risk Adult (2 of 2 - PCV13) 06/30/2002  . TETANUS/TDAP  07/01/2011  . FOOT EXAM  12/21/2015  . OPHTHALMOLOGY EXAM  04/02/2016    Patient Care Team: Blanchie Serve, MD as PCP - General (Internal Medicine) [place] Mast, Man X, NP as Nurse Practitioner (Nurse Practitioner) Jacelyn Pi, MD as Consulting Physician (Endocrinology) Calvert Cantor, MD as Consulting Physician (Ophthalmology) Vickey Huger, MD as Consulting Physician (Orthopedic Surgery) Suella Broad, MD as Consulting Physician (Physical Medicine and Rehabilitation) Erline Levine, MD as Consulting Physician (Neurosurgery) Juanita Craver, MD as Consulting Physician (Gastroenterology) Martinique, Peter M, MD as Consulting Physician (Cardiology) Megan Salon, MD as Consulting Physician (Gynecology)  Indicate any recent Medical Services you may have received from other than [facility] providers in the past year (date may be approximate).     Assessment:   This is a routine wellness examination for [place].  Hearing/Vision screen Hearing Screening Comments: Patient doesn't have any heairng problems Vision Screening Comments: Trying to find out who past eye doctor is and will go when she finds out  Dietary issues and exercise activities discussed: Current  Exercise Habits: Home exercise routine, Type of exercise: walking, Time (Minutes): 20, Frequency (Times/Week): 7, Weekly Exercise (Minutes/Week): 140, Intensity: Mild, Exercise limited by: orthopedic condition(s)  Goals    None     Depression Screen PHQ 2/9 Scores 11/24/2017 07/09/2017 11/01/2015 06/21/2015 12/21/2014 01/03/2014 12/08/2013  PHQ - 2 Score 0 0 0 0 0 0 0    Fall Risk Fall Risk  11/24/2017 07/09/2017 11/01/2015 07/26/2015 06/21/2015  Falls in the past year? No No No No No    Is the patient's home free of loose throw rugs in walkways, pet beds, electrical cords, etc?   yes      Grab bars in the bathroom? yes      Handrails on the stairs?   yes      Adequate lighting?   yes  Cognitive Function: MMSE - Mini Mental State Exam 11/09/2017 05/01/2016 06/08/2014 02/17/2013  Orientation to time 3 5 5 4   Orientation to Place 4 5 5 5   Registration 2 3 3 3   Attention/ Calculation 5 5 5 5   Recall 2 2 3 3   Language- name 2 objects 2 2 2 2   Language- repeat 1 1 1 1   Language- follow 3 step command 3 3 3 3   Language- read & follow direction 1 1 1 1   Write a sentence 1 1 1 1   Copy design 0 1 1 1   Total score 24 29 30 29         Screening Tests Health Maintenance  Topic Date Due  . PNA vac Low Risk Adult (2 of 2 - PCV13) 06/30/2002  . TETANUS/TDAP  07/01/2011  . FOOT EXAM  12/21/2015  . OPHTHALMOLOGY EXAM  04/02/2016  . INFLUENZA VACCINE  07/29/2018 (Originally 01/28/2018)  . HEMOGLOBIN A1C  05/22/2018  . DEXA SCAN  Completed    Qualifies for Shingles Vaccine? Not in past records  Cancer Screenings: Lung: Low Dose CT Chest recommended if Age 10-80 years, 30 pack-year currently smoking OR have quit w/in 15years. Patient does not qualify. Breast: Up to date on Mammogram? Yes   Up to date of Bone Density/Dexa? Yes Colorectal: up to date  Additional Screenings:  Hepatitis C Screening: declined TDAP due, ordered PNA 13 due, ordered  Patient is in the process of making next eye exam     Plan:    I have personally reviewed and addressed the Medicare Annual Wellness questionnaire and have noted the following in the patient's chart:  A. Medical and social history B. Use of alcohol, tobacco or illicit drugs  C. Current medications and supplements D. Functional ability and status E.  Nutritional status F.  Physical activity G. Advance directives H. List of other physicians I.  Hospitalizations, surgeries, and ER visits in previous 12 months J.  [place] to include hearing, vision, cognitive, depression L. Referrals and appointments - none  In addition, I have reviewed and discussed with patient certain preventive protocols, quality metrics, and best practice recommendations. A written personalized care plan for preventive services as well as general preventive health recommendations were provided to patient.  See attached scanned questionnaire for additional information.   Signed,   Tyson Dense, RN Nurse Health Advisor  Patient Concerns: None

## 2017-11-25 ENCOUNTER — Encounter: Payer: Self-pay | Admitting: Nurse Practitioner

## 2017-11-25 ENCOUNTER — Non-Acute Institutional Stay: Payer: Medicare Other | Admitting: Nurse Practitioner

## 2017-11-25 DIAGNOSIS — E1142 Type 2 diabetes mellitus with diabetic polyneuropathy: Secondary | ICD-10-CM | POA: Diagnosis not present

## 2017-11-25 DIAGNOSIS — M159 Polyosteoarthritis, unspecified: Secondary | ICD-10-CM

## 2017-11-25 NOTE — Progress Notes (Signed)
Location:  [place] Room Number: 527 Place of Service:  ALF [phone]) Provider:  Aryona Sill, ManXie  NP  Blanchie Serve, MD  Patient Care Team: Blanchie Serve, MD as PCP - General (Internal Medicine) [place] Hendrix Console X, NP as Nurse Practitioner (Nurse Practitioner) Jacelyn Pi, MD as Consulting Physician (Endocrinology) Calvert Cantor, MD as Consulting Physician (Ophthalmology) Vickey Huger, MD as Consulting Physician (Orthopedic Surgery) Suella Broad, MD as Consulting Physician (Physical Medicine and Rehabilitation) Erline Levine, MD as Consulting Physician (Neurosurgery) Juanita Craver, MD as Consulting Physician (Gastroenterology) [place], Peter M, MD as Consulting Physician (Cardiology) Megan Salon, MD as Consulting Physician (Gynecology)  Extended Emergency Contact Information Primary Emergency Contact: [name], Will [place] Phone: [phone] Relation: Friend  Code Status:  DNR Goals of care: Advanced Directive information Advanced Directives 11/24/2017  Does Patient Have a Medical Advance Directive? Yes  Type of Advance Directive Living will  Does patient want to make changes to medical advance directive? No - Patient declined  Copy of [place] in Chart? -  Would patient like information on creating a medical advance directive? -     Chief Complaint  Patient presents with  . Acute Visit    Elevated A1C    HPI:  Pt is a 84 y.o. female seen today for an acute visit for uncontrolled blood sugar. Hx of T2DM, no longer diet controlled, 01/16/15 Hgb A1c 6.8, 08/13/17 Hgb a1c 7.0, TSH 0.45, 11/19/17 Hgb a1c 7.5, Na 140, K 4.3, Bun 18, creat 0.82, TP 6.3, albumin 4.2. She stated her knee hurts if she walks to much.    Past Medical History:  Diagnosis Date  . Contact dermatitis and other eczema, due to unspecified cause   . Cortical senile cataract   . GERD  (gastroesophageal reflux disease) 02/17/2013  . Hyperparathyroidism, unspecified ([place]) 03/26/2010  . Irritable bowel syndrome   . Keratoconjunctivitis sicca, not specified as Sjogren's   . Loss of weight   . Macular degeneration (senile) of retina, unspecified   . Memory loss   . Other and unspecified hyperlipidemia   . Other malaise and fatigue 06/17/2010  . Pain in joint, site unspecified   . Pathologic fracture of vertebrae   . Reflux esophagitis   . Scoliosis (and kyphoscoliosis), idiopathic   . Senile osteoporosis   . Type II or unspecified type diabetes mellitus without mention of complication, uncontrolled   . Unspecified essential hypertension   . Unspecified hereditary and idiopathic peripheral neuropathy   . Unspecified vitamin D deficiency    Past Surgical History:  Procedure Laterality Date  . APPENDECTOMY    . BREAST LUMPECTOMY  02/05/2010   Dr. Christie Beckers  . CATARACT EXTRACTION W/ INTRAOCULAR LENS  IMPLANT, BILATERAL  04/2013   Dr. Bing Plume  . CHOLECYSTECTOMY  2004   Dr. Zella Richer  . COLONOSCOPY  2001   Dr. Collene Mares  . fibroidectomy  1950  . [place]  . KYPHOPLASTY  2010   T10 & T12 Dr. Erline Levine  . MEDIAL PARTIAL KNEE REPLACEMENT Left 2004   Dr. Ronnie Derby  . SPINE SURGERY     vertebroplasty T10, T12  . SPINE SURGERY  11/02/2001    C4-5 & C5-6 Titanium Plate Placed Dr. Carloyn Manner  . THYROIDECTOMY, PARTIAL Left 2007  . TOTAL KNEE ARTHROPLASTY Right 2009   Dr. Alvan Dame     Allergies  Allergen Reactions  .  Bactrim     unknown  . Betadine [Povidone Iodine]     unknown  . Metformin And Related     Hurt stomach  . Nutmeg Oil (Myristica Oil)     unknown  . Sulfa Antibiotics     unknown    Outpatient Encounter Medications as of 11/25/2017  Medication Sig  . carboxymethylcellul-glycerin (OPTIVE) 0.5-0.9 % ophthalmic solution Place 2 drops into both eyes every 8 (eight) hours as needed for dry eyes.  Marland Kitchen glucose blood (TRUE METRIX BLOOD GLUCOSE TEST) test strip  Use to check sugar twice a day. Dx:E11.42  . losartan (COZAAR) 50 MG tablet TAKE 1 TABLET ONCE DAILY TO CONTROL BLOOD PRESSURE.  . Multiple Vitamins-Minerals (ICAPS) TABS Take 1 tablet by mouth daily.  . TRUEPLUS LANCETS 28G MISC CHECK BLOOD SUGAR TWICE DAILY.   No facility-administered encounter medications on file as of 11/25/2017.     Review of Systems  Constitutional: Negative for activity change, appetite change, chills, diaphoresis, fatigue and fever.  HENT: Positive for hearing loss. Negative for congestion and voice change.   Respiratory: Negative for cough and shortness of breath.   Cardiovascular: Positive for leg swelling. Negative for chest pain.  Gastrointestinal: Negative for abdominal distention, abdominal pain, constipation, diarrhea, nausea and vomiting.  Genitourinary: Negative for difficulty urinating and dysuria.  Musculoskeletal: Positive for arthralgias and gait problem.  Skin: Negative for color change and pallor.  Neurological: Negative for dizziness, speech difficulty, weakness, numbness and headaches.       Memory lapses. Some times tingling sensation in legs.   Psychiatric/Behavioral: Negative for agitation, behavioral problems and confusion.    Immunization History  Administered Date(s) Administered  . Influenza Whole 03/30/2012, 04/13/2013  . Influenza-Unspecified 04/17/2014, 03/29/2015  . Pneumococcal Polysaccharide-23 06/30/2001  . Td 06/30/2001   Pertinent  Health Maintenance Due  Topic Date Due  . PNA vac Low Risk Adult (2 of 2 - PCV13) 06/30/2002  . FOOT EXAM  12/21/2015  . OPHTHALMOLOGY EXAM  04/02/2016  . INFLUENZA VACCINE  07/29/2018 (Originally 01/28/2018)  . HEMOGLOBIN A1C  05/22/2018  . DEXA SCAN  Completed   Fall Risk  11/24/2017 07/09/2017 11/01/2015 07/26/2015 06/21/2015  Falls in the past year? No No No No No   Functional Status Survey:    Vitals:   11/25/17 1050  BP: (!) 142/68  Pulse: 76  Resp: 18  Temp: 98.8 F (37.1 C)    Weight: 112 lb 8 oz (51 kg)  Height: 5\' 3"  (1.6 m)   Body mass index is 19.93 kg/m. Physical Exam  Constitutional: She appears well-developed and well-nourished.  HENT:  Head: Normocephalic and atraumatic.  Eyes: Pupils are equal, round, and reactive to light. EOM are normal.  Neck: Normal range of motion. Neck supple. No JVD present. No thyromegaly present.  Cardiovascular: Normal rate and regular rhythm.  No murmur heard. Pulmonary/Chest: Effort normal. She has no wheezes.  Abdominal: Soft. Bowel sounds are normal.  Musculoskeletal: She exhibits edema.  Trace edema BLE. Ambulates with walker.   Neurological: She is alert. No cranial nerve deficit. She exhibits normal muscle tone. Coordination normal.  Oriented to person and place.   Skin: Skin is warm and dry.  Psychiatric: She has a normal mood and affect. Her behavior is normal.    Labs reviewed: Recent Labs    07/10/17 11/19/17  NA 136* 140  K 4.3 4.3  CL  --  104  CO2  --  30  BUN 16 18  CREATININE 0.9 0.8  CALCIUM  --  11.3   Recent Labs    07/10/17 11/19/17  AST 15 21  ALT 10 16  ALKPHOS 62 65  ALBUMIN  --  4.2   Recent Labs    07/10/17  WBC 10.6  HGB 12.8  HCT 36  PLT 297   Lab Results  Component Value Date   TSH 0.45 08/13/2017   Lab Results  Component Value Date   HGBA1C 7.5 11/19/2017   Lab Results  Component Value Date   CHOL 198 11/19/2017   HDL 78 (A) 11/19/2017   LDLCALC 106 11/19/2017   TRIG 53 11/19/2017    Significant Diagnostic Results in last 30 days:  No results found.  Assessment/Plan DM type 2 with diabetic peripheral neuropathy (HCC) Hx of T2DM, no longer diet controlled, 01/16/15 Hgb A1c 6.8, 08/13/17 Hgb a1c 7.0, TSH 0.45, 11/19/17 Hgb a1c 7.5, Na 140, K 4.3, Bun 18, creat 0.82, TP 6.3, albumin 4.2. Will add Metformin ER 500mg  po qhs, monitor fasting CBG in am x 1 week, then re-evaluate.    Osteoarthritis of multiple joints Pain in knees mostly, she ambulates with  walker, but not as active as piror which may contribute to elevated blood sugar.        Family/ staff Communication: plan of care reviewed with the patient and charge nurse.   Labs/tests ordered: none  Time spend 25 minutes.

## 2017-11-25 NOTE — Assessment & Plan Note (Addendum)
Hx of T2DM, no longer diet controlled, 01/16/15 Hgb A1c 6.8, 08/13/17 Hgb a1c 7.0, TSH 0.45, 11/19/17 Hgb a1c 7.5, Na 140, K 4.3, Bun 18, creat 0.82, TP 6.3, albumin 4.2. Will add Metformin ER 500mg  po qhs, monitor fasting CBG in am x 1 week, then re-evaluate.

## 2017-11-25 NOTE — Assessment & Plan Note (Signed)
Pain in knees mostly, she ambulates with walker, but not as active as piror which may contribute to elevated blood sugar.

## 2017-12-28 ENCOUNTER — Non-Acute Institutional Stay: Payer: Medicare Other | Admitting: Nurse Practitioner

## 2017-12-28 ENCOUNTER — Encounter: Payer: Self-pay | Admitting: Nurse Practitioner

## 2017-12-28 DIAGNOSIS — I10 Essential (primary) hypertension: Secondary | ICD-10-CM | POA: Diagnosis not present

## 2017-12-28 DIAGNOSIS — J069 Acute upper respiratory infection, unspecified: Secondary | ICD-10-CM

## 2017-12-28 DIAGNOSIS — J811 Chronic pulmonary edema: Secondary | ICD-10-CM | POA: Diagnosis not present

## 2017-12-28 DIAGNOSIS — R05 Cough: Secondary | ICD-10-CM | POA: Diagnosis not present

## 2017-12-28 MED ORDER — METFORMIN HCL 500 MG PO TABS: 500.0000 mg | ORAL_TABLET | Freq: Two times a day (BID) | ORAL | 3 refills | Status: DC

## 2017-12-28 MED ORDER — METFORMIN HCL 500 MG PO TABS: 500.0000 mg | ORAL_TABLET | Freq: Every day | ORAL | 3 refills | Status: DC

## 2017-12-28 NOTE — Progress Notes (Signed)
Location:   [place] Room Number: 829 Place of Service:  ALF (13) Provider: Lennie Odor Shalunda Lindh NP  Blanchie Serve, MD  Patient Care Team: Blanchie Serve, MD as PCP - General (Internal Medicine) [place] Camdyn Beske X, NP as Nurse Practitioner (Nurse Practitioner) Jacelyn Pi, MD as Consulting Physician (Endocrinology) Calvert Cantor, MD as Consulting Physician (Ophthalmology) Vickey Huger, MD as Consulting Physician (Orthopedic Surgery) Suella Broad, MD as Consulting Physician (Physical Medicine and Rehabilitation) Erline Levine, MD as Consulting Physician (Neurosurgery) Juanita Craver, MD as Consulting Physician (Gastroenterology) Martinique, Peter M, MD as Consulting Physician (Cardiology) Megan Salon, MD as Consulting Physician (Gynecology)  Extended Emergency Contact Information Primary Emergency Contact: [name], [place] Phone: [phone] Relation: Friend  Code Status: DNR Goals of care: Advanced Directive information Advanced Directives 11/24/2017  Does Patient Have a Medical Advance Directive? Yes  Type of Advance Directive Living will  Does patient want to make changes to medical advance directive? No - Patient declined  Copy of [place] in Chart? -  Would patient like information on creating a medical advance directive? -     Chief Complaint  Patient presents with  . Acute Visit    congested cough x 1 week, low grade fever    HPI:  Pt is a 84 y.o. female seen today for an acute visit for congested cough about a week, Robitussin prn x 48hours with very little efficacy. Her T 100.1 today, she denied sputum production, chest pain/pressure, palpitation, SOB. No O2 desaturation. Hx of HTN, blood pressure is controlled, has been on Losartan 50mg  daily po.    Past Medical History:  Diagnosis Date  . Contact dermatitis and other eczema, due to unspecified cause   .  Cortical senile cataract   . GERD (gastroesophageal reflux disease) 02/17/2013  . Hyperparathyroidism, unspecified ([place]) 03/26/2010  . Irritable bowel syndrome   . Keratoconjunctivitis sicca, not specified as Sjogren's   . Loss of weight   . Macular degeneration (senile) of retina, unspecified   . Memory loss   . Other and unspecified hyperlipidemia   . Other malaise and fatigue 06/17/2010  . Pain in joint, site unspecified   . Pathologic fracture of vertebrae   . Reflux esophagitis   . Scoliosis (and kyphoscoliosis), idiopathic   . Senile osteoporosis   . Type II or unspecified type diabetes mellitus without mention of complication, uncontrolled   . Unspecified essential hypertension   . Unspecified hereditary and idiopathic peripheral neuropathy   . Unspecified vitamin D deficiency    Past Surgical History:  Procedure Laterality Date  . APPENDECTOMY    . BREAST LUMPECTOMY  02/05/2010   Dr. Christie Beckers  . CATARACT EXTRACTION W/ INTRAOCULAR LENS  IMPLANT, BILATERAL  04/2013   Dr. Bing Plume  . CHOLECYSTECTOMY  2004   Dr. Zella Richer  . COLONOSCOPY  2001   Dr. Collene Mares  . fibroidectomy  1950  . [place]  . KYPHOPLASTY  2010   T10 & T12 Dr. Erline Levine  . MEDIAL PARTIAL KNEE REPLACEMENT Left 2004   Dr. Ronnie Derby  . SPINE SURGERY     vertebroplasty T10, T12  . SPINE SURGERY  11/02/2001    C4-5 & C5-6 Titanium Plate Placed Dr. Carloyn Manner  . THYROIDECTOMY, PARTIAL Left 2007  . TOTAL KNEE ARTHROPLASTY Right 2009   Dr. Alvan Dame     Allergies  Allergen Reactions  .  Bactrim     unknown  . Betadine [Povidone Iodine]     unknown  . Metformin And Related     Hurt stomach  . Nutmeg Oil (Myristica Oil)     unknown  . Sulfa Antibiotics     unknown    Allergies as of 12/28/2017      Reactions   Bactrim    unknown   Betadine [povidone Iodine]    unknown   Metformin And Related    Hurt stomach   Nutmeg Oil (myristica Oil)    unknown   Sulfa Antibiotics    unknown        Medication List        Accurate as of 12/28/17 11:59 PM. Always use your most recent med list.          glucose blood test strip Commonly known as:  TRUE METRIX BLOOD GLUCOSE TEST Use to check sugar twice a day. Dx:E11.42   ICAPS Tabs Take 1 tablet by mouth daily.   losartan 50 MG tablet Commonly known as:  COZAAR TAKE 1 TABLET ONCE DAILY TO CONTROL BLOOD PRESSURE.   metFORMIN 500 MG tablet Commonly known as:  GLUCOPHAGE Take 1 tablet (500 mg total) by mouth daily with breakfast.   OPTIVE 0.5-0.9 % ophthalmic solution Generic drug:  carboxymethylcellul-glycerin Place 2 drops into both eyes every 8 (eight) hours as needed for dry eyes.   TRUEPLUS LANCETS 28G Misc CHECK BLOOD SUGAR TWICE DAILY.       Review of Systems  Constitutional: Positive for fever. Negative for activity change, appetite change, chills, diaphoresis and fatigue.  HENT: Positive for hearing loss. Negative for congestion.   Respiratory: Positive for cough. Negative for shortness of breath.   Musculoskeletal: Positive for gait problem.  Skin: Negative for color change and pallor.  Neurological: Negative for dizziness, facial asymmetry, speech difficulty, weakness and headaches.       Memory lapses.   Psychiatric/Behavioral: Negative for agitation and behavioral problems.    Immunization History  Administered Date(s) Administered  . Influenza Whole 03/30/2012, 04/13/2013  . Influenza-Unspecified 04/17/2014, 03/29/2015  . Pneumococcal Polysaccharide-23 06/30/2001  . Td 06/30/2001   Pertinent  Health Maintenance Due  Topic Date Due  . PNA vac Low Risk Adult (2 of 2 - PCV13) 06/30/2002  . FOOT EXAM  12/21/2015  . OPHTHALMOLOGY EXAM  04/02/2016  . INFLUENZA VACCINE  07/29/2018 (Originally 01/28/2018)  . HEMOGLOBIN A1C  05/22/2018  . DEXA SCAN  Completed   Fall Risk  11/24/2017 07/09/2017 11/01/2015 07/26/2015 06/21/2015  Falls in the past year? No No No No No   Functional Status Survey:    Vitals:    12/28/17 1443  BP: 128/60  Pulse: 100  Resp: 20  Temp: 100.1 F (37.8 C)  SpO2: 97%   There is no height or weight on file to calculate BMI. Physical Exam  Constitutional: She appears well-developed and well-nourished.  HENT:  Head: Normocephalic and atraumatic.  Eyes: Pupils are equal, round, and reactive to light. EOM are normal.  Neck: Normal range of motion. Neck supple. No JVD present. No thyromegaly present.  Cardiovascular: Normal rate and regular rhythm.  No murmur heard. Pulmonary/Chest: She has no wheezes. She has no rales.  Central congestion and anterior lung fields coarse rhonchi  Musculoskeletal: She exhibits no edema.  Ambulates with walker  Neurological: She is alert.  Oriented to person and place.   Skin: Skin is warm and dry.  Psychiatric: She has a normal mood and  affect. Her behavior is normal.    Labs reviewed: Recent Labs    07/10/17 11/19/17  NA 136* 140  K 4.3 4.3  CL  --  104  CO2  --  30  BUN 16 18  CREATININE 0.9 0.8  CALCIUM  --  11.3   Recent Labs    07/10/17 11/19/17  AST 15 21  ALT 10 16  ALKPHOS 62 65  ALBUMIN  --  4.2   Recent Labs    07/10/17  WBC 10.6  HGB 12.8  HCT 36  PLT 297   Lab Results  Component Value Date   TSH 0.45 08/13/2017   Lab Results  Component Value Date   HGBA1C 7.5 11/19/2017   Lab Results  Component Value Date   CHOL 198 11/19/2017   HDL 78 (A) 11/19/2017   LDLCALC 106 11/19/2017   TRIG 53 11/19/2017    Significant Diagnostic Results in last 30 days:  No results found.  Assessment/Plan: Acute URI Congestive cough x 1 week, obtain CXR CBC/diff CMP to r/o PNA, will start Azithromycin 500mg  x day1, then 25mg  qd day 2-5, Mucinex 600mg  bid x 3 days. Observe.   Essential hypertension Controlled blood pressure, continue Losartan 50mg  daily.     Family/ staff Communication: plan of care reviewed with the patient and charge nurse.   Labs/tests ordered: CBC/diff, CMP, CXR  Time spend 25  minutes.

## 2017-12-28 NOTE — Assessment & Plan Note (Signed)
Congestive cough x 1 week, obtain CXR CBC/diff CMP to r/o PNA, will start Azithromycin 500mg  x day1, then 25mg  qd day 2-5, Mucinex 600mg  bid x 3 days. Observe.

## 2017-12-29 DIAGNOSIS — R509 Fever, unspecified: Secondary | ICD-10-CM | POA: Diagnosis not present

## 2017-12-29 LAB — CBC AND DIFFERENTIAL
HCT: 34 — AB (ref 36–46)
Hemoglobin: 12.1 (ref 12.0–16.0)
Platelets: 277 (ref 150–399)
WBC: 12.1

## 2017-12-29 LAB — HEPATIC FUNCTION PANEL
ALT: 15 (ref 7–35)
AST: 18 (ref 13–35)
Alkaline Phosphatase: 74 (ref 25–125)
Bilirubin, Total: 0.9

## 2017-12-29 LAB — BASIC METABOLIC PANEL
BUN: 10 (ref 4–21)
Creatinine: 0.7 (ref <=1.1)
Glucose: 119
Potassium: 4.1 (ref 3.4–5.3)
Sodium: 138 (ref 137–147)

## 2017-12-29 NOTE — Assessment & Plan Note (Signed)
Controlled blood pressure, continue Losartan 50mg  daily.

## 2017-12-30 ENCOUNTER — Encounter: Payer: Self-pay | Admitting: Nurse Practitioner

## 2018-01-01 ENCOUNTER — Other Ambulatory Visit: Payer: Self-pay | Admitting: *Deleted

## 2018-01-01 LAB — COMPLETE METABOLIC PANEL WITH GFR
Albumin: 3.6
Calcium: 10.7
Carbon Dioxide, Total: 26
Chloride: 104
EGFR (Non-African Amer.): 75
Protein: 5.8

## 2018-01-08 LAB — CALCIUM, IONIZED: Calcium, ionized (corrected): 6

## 2018-02-05 ENCOUNTER — Non-Acute Institutional Stay: Payer: Medicare Other | Admitting: Nurse Practitioner

## 2018-02-05 ENCOUNTER — Encounter: Payer: Self-pay | Admitting: Nurse Practitioner

## 2018-02-05 DIAGNOSIS — G8929 Other chronic pain: Secondary | ICD-10-CM | POA: Insufficient documentation

## 2018-02-05 DIAGNOSIS — M549 Dorsalgia, unspecified: Secondary | ICD-10-CM

## 2018-02-05 DIAGNOSIS — K219 Gastro-esophageal reflux disease without esophagitis: Secondary | ICD-10-CM

## 2018-02-05 DIAGNOSIS — I10 Essential (primary) hypertension: Secondary | ICD-10-CM

## 2018-02-05 DIAGNOSIS — E1142 Type 2 diabetes mellitus with diabetic polyneuropathy: Secondary | ICD-10-CM

## 2018-02-05 NOTE — Assessment & Plan Note (Signed)
T2DM, blood sugar is controlled, continue Metformin 500mg  qd. Last Hgb a1c 7.5 11/19/17

## 2018-02-05 NOTE — Progress Notes (Signed)
Location:  [place] Room Number: 333 Place of Service:  ALF [phone]) Provider:  Alita Howard, ManXie  NP  Maria Serve, MD  Patient Care Team: Maria Serve, MD as PCP - General (Internal Medicine) [name], [name] Maria Stefanski X, NP as Nurse Practitioner (Nurse Practitioner) Maria Pi, MD as Consulting Physician (Endocrinology) Maria Cantor, MD as Consulting Physician (Ophthalmology) Maria Huger, MD as Consulting Physician (Orthopedic Surgery) Maria Broad, MD as Consulting Physician (Physical Medicine and Rehabilitation) Maria Levine, MD as Consulting Physician (Neurosurgery) Maria Craver, MD as Consulting Physician (Gastroenterology) Martinique, Maria M, MD as Consulting Physician (Cardiology) Maria Salon, MD as Consulting Physician (Gynecology)  Extended Emergency Contact Information Primary Emergency Contact: [name], [place] of [place] Phone: [phone] Relation: Friend  Code Status:  DNR Goals of care: Advanced Directive information Advanced Directives 02/05/2018  Does Patient Have a Medical Advance Directive? Yes  Type of Advance Directive Living will  Does patient want to make changes to medical advance directive? No - Patient declined  Copy of [place] in Chart? -  Would patient like information on creating a medical advance directive? -     Chief Complaint  Patient presents with  . Medical Management of Chronic Issues    F/U- DMtype 2, HTN, memory loss,osteoarthritis    HPI:  Pt is a 84 y.o. female seen today for medical management of chronic diseases.     The patient has hx of kyphoscoliosis, upper back pain occasionally, prn tylenol is adequate, pain doesn't interfere her night sleep or daily activities. T2DM, blood sugar is controlled on Metformin 500mg  qd. Last Hgb a1c 7.5 11/19/17.  Hx of HTN, blood pressure is controlled on Losartan 50mg  qd.    Past Medical  History:  Diagnosis Date  . Contact dermatitis and other eczema, due to unspecified cause   . Cortical senile cataract   . GERD (gastroesophageal reflux disease) 02/17/2013  . Hyperparathyroidism, unspecified ([place]) 03/26/2010  . Irritable bowel syndrome   . Keratoconjunctivitis sicca, not specified as Sjogren's   . Loss of weight   . Macular degeneration (senile) of retina, unspecified   . Memory loss   . Other and unspecified hyperlipidemia   . Other malaise and fatigue 06/17/2010  . Pain in joint, site unspecified   . Pathologic fracture of vertebrae   . Reflux esophagitis   . Scoliosis (and kyphoscoliosis), idiopathic   . Senile osteoporosis   . Type II or unspecified type diabetes mellitus without mention of complication, uncontrolled   . Unspecified essential hypertension   . Unspecified hereditary and idiopathic peripheral neuropathy   . Unspecified vitamin D deficiency    Past Surgical History:  Procedure Laterality Date  . APPENDECTOMY    . BREAST LUMPECTOMY  02/05/2010   Dr. Christie Howard  . CATARACT EXTRACTION W/ INTRAOCULAR LENS  IMPLANT, BILATERAL  04/2013   Dr. Bing Howard  . CHOLECYSTECTOMY  2004   Dr. Zella Howard  . COLONOSCOPY  2001   Dr. Collene Howard  . fibroidectomy  1950  . [place]  . KYPHOPLASTY  2010   T10 & T12 Dr. Erline Howard  . MEDIAL PARTIAL KNEE REPLACEMENT Left 2004   Dr. Ronnie Howard  . SPINE SURGERY     vertebroplasty T10, T12  . SPINE SURGERY  11/02/2001    C4-5 & C5-6 Titanium Plate Placed Dr. Carloyn Howard  . THYROIDECTOMY, PARTIAL Left 2007  . TOTAL KNEE ARTHROPLASTY  Right 2009   Dr. Alvan Dame     Allergies  Allergen Reactions  . Bactrim     unknown  . Betadine [Povidone Iodine]     unknown  . Metformin And Related     Hurt stomach  . Nutmeg Oil (Myristica Oil)     unknown  . Sulfa Antibiotics     unknown    Outpatient Encounter Medications as of 02/05/2018  Medication Sig  . acetaminophen (TYLENOL) 325 MG tablet Take 650 mg by mouth every 8  (eight) hours as needed for mild pain or moderate pain.  . carboxymethylcellul-glycerin (OPTIVE) 0.5-0.9 % ophthalmic solution Place 2 drops into both eyes every 8 (eight) hours as needed for dry eyes.  Marland Kitchen losartan (COZAAR) 50 MG tablet TAKE 1 TABLET ONCE DAILY TO CONTROL BLOOD PRESSURE.  . metFORMIN (GLUCOPHAGE) 500 MG tablet Take 1 tablet (500 mg total) by mouth daily with breakfast. (Patient taking differently: Take 500 mg by mouth at bedtime. )  . Multiple Vitamins-Minerals (ICAPS) TABS Take 1 tablet by mouth daily.  . [DISCONTINUED] glucose blood (TRUE METRIX BLOOD GLUCOSE TEST) test strip Use to check sugar twice a day. Dx:E11.42  . [DISCONTINUED] TRUEPLUS LANCETS 28G MISC CHECK BLOOD SUGAR TWICE DAILY.   No facility-administered encounter medications on file as of 02/05/2018.     Review of Systems  Constitutional: Negative for activity change, appetite change, chills, diaphoresis, fatigue and fever.  HENT: Positive for hearing loss. Negative for congestion and voice change.   Respiratory: Negative for cough, shortness of breath and wheezing.   Cardiovascular: Positive for leg swelling. Negative for chest pain and palpitations.  Gastrointestinal: Negative for abdominal distention, abdominal pain, constipation, diarrhea, nausea and vomiting.  Genitourinary: Negative for difficulty urinating, dysuria and urgency.  Musculoskeletal: Positive for back pain and gait problem.  Skin: Negative for color change and pallor.  Neurological: Negative for dizziness, speech difficulty and headaches.       Memory lapses.   Psychiatric/Behavioral: Negative for agitation, behavioral problems, hallucinations and sleep disturbance. The patient is not nervous/anxious.     Immunization History  Administered Date(s) Administered  . Influenza Whole 03/30/2012, 04/13/2013  . Influenza-Unspecified 04/17/2014, 03/29/2015  . Pneumococcal Polysaccharide-23 06/30/2001  . Td 06/30/2001   Pertinent  Health  Maintenance Due  Topic Date Due  . PNA vac Low Risk Adult (2 of 2 - PCV13) 06/30/2002  . FOOT EXAM  12/21/2015  . OPHTHALMOLOGY EXAM  04/02/2016  . INFLUENZA VACCINE  07/29/2018 (Originally 01/28/2018)  . HEMOGLOBIN A1C  05/22/2018  . DEXA SCAN  Completed   Fall Risk  11/24/2017 07/09/2017 11/01/2015 07/26/2015 06/21/2015  Falls in the past year? No No No No No   Functional Status Survey:    Vitals:   02/05/18 1537  BP: (!) 148/70  Pulse: 72  Resp: 18  Temp: 98.9 F (37.2 C)  SpO2: 96%  Weight: 110 lb (49.9 kg)  Height: 5\' 3"  (1.6 Howard)   Body mass index is 19.49 kg/Howard. Physical Exam  Constitutional: She appears well-developed and well-nourished.  HENT:  Head: Normocephalic and atraumatic.  Eyes: Pupils are equal, round, and reactive to light. EOM are normal.  Neck: Normal range of motion. Neck supple. No JVD present. No thyromegaly present.  Cardiovascular: Normal rate and regular rhythm.  Murmur heard. Pulmonary/Chest: Effort normal and breath sounds normal. She has no wheezes. She has no rales.  Abdominal: Soft. Bowel sounds are normal. She exhibits no distension. There is no tenderness.  Musculoskeletal: She exhibits edema  and tenderness.  Trace edema BLE. Ambulates with walker. Upper back between shoulder blades sometimes, better after rest or prn Tylenol.   Neurological: She is alert. No cranial nerve deficit. She exhibits normal muscle tone. Coordination normal.  Oriented to person and place.   Skin: Skin is warm and dry.  Psychiatric: She has a normal mood and affect. Her behavior is normal. Judgment and thought content normal.    Labs reviewed: Recent Labs    07/10/17 11/19/17 12/29/17  NA 136* 140 138  K 4.3 4.3 4.1  CL  --  104 104  CO2  --  30 26  BUN 16 18 10   CREATININE 0.9 0.8 0.7  CALCIUM  --  11.3 10.7   Recent Labs    07/10/17 11/19/17 12/29/17  AST 15 21 18   ALT 10 16 15   ALKPHOS 62 65 74  ALBUMIN  --  4.2 3.6   Recent Labs    07/10/17 12/29/17   WBC 10.6 12.1  HGB 12.8 12.1  HCT 36 34*  PLT 297 277   Lab Results  Component Value Date   TSH 0.45 08/13/2017   Lab Results  Component Value Date   HGBA1C 7.5 11/19/2017   Lab Results  Component Value Date   CHOL 198 11/19/2017   HDL 78 (A) 11/19/2017   LDLCALC 106 11/19/2017   TRIG 53 11/19/2017    Significant Diagnostic Results in last 30 days:  No results found.  Assessment/Plan Chronic upper back pain hx of kyphoscoliosis, upper back pain occasionally, prn tylenol is adequate, pain doesn't interfere her night sleep or daily activities.   DM type 2 with diabetic peripheral neuropathy (HCC) T2DM, blood sugar is controlled, continue Metformin 500mg  qd. Last Hgb a1c 7.5 11/19/17  Essential hypertension Hx of HTN, blood pressure is controlled, continue  Losartan 50mg  qd.     GERD (gastroesophageal reflux disease) Heat burns or indigestion sometimes, but she declined acid reducer.      Family/ staff Communication: plan of care reviewed with the patient and charge nurse.   Labs/tests ordered:  none  Time spend 25 minutes.

## 2018-02-05 NOTE — Assessment & Plan Note (Signed)
Heat burns or indigestion sometimes, but she declined acid reducer.

## 2018-02-05 NOTE — Assessment & Plan Note (Signed)
Hx of HTN, blood pressure is controlled, continue  Losartan 50mg  qd.

## 2018-02-05 NOTE — Assessment & Plan Note (Signed)
hx of kyphoscoliosis, upper back pain occasionally, prn tylenol is adequate, pain doesn't interfere her night sleep or daily activities.

## 2018-02-10 ENCOUNTER — Encounter: Payer: Self-pay | Admitting: Internal Medicine

## 2018-03-31 ENCOUNTER — Non-Acute Institutional Stay: Payer: Medicare Other | Admitting: Nurse Practitioner

## 2018-03-31 ENCOUNTER — Encounter: Payer: Self-pay | Admitting: Nurse Practitioner

## 2018-03-31 ENCOUNTER — Encounter: Payer: Self-pay | Admitting: *Deleted

## 2018-03-31 DIAGNOSIS — R0789 Other chest pain: Secondary | ICD-10-CM

## 2018-03-31 DIAGNOSIS — I10 Essential (primary) hypertension: Secondary | ICD-10-CM | POA: Diagnosis not present

## 2018-03-31 DIAGNOSIS — M546 Pain in thoracic spine: Secondary | ICD-10-CM | POA: Diagnosis not present

## 2018-03-31 DIAGNOSIS — R079 Chest pain, unspecified: Secondary | ICD-10-CM

## 2018-03-31 NOTE — Progress Notes (Signed)
Location:  [place] Room Number: 258 Place of Service:  ALF [phone]) Provider:  Mast, Lennie Odor  NP  Mast, Man X, NP  Patient Care Team: Mast, Man X, NP as PCP - General (Internal Medicine) [place] Mast, Man X, NP as Nurse Practitioner (Nurse Practitioner) Jacelyn Pi, MD as Consulting Physician (Endocrinology) Calvert Cantor, MD as Consulting Physician (Ophthalmology) Vickey Huger, MD as Consulting Physician (Orthopedic Surgery) Suella Broad, MD as Consulting Physician (Physical Medicine and Rehabilitation) Erline Levine, MD as Consulting Physician (Neurosurgery) Juanita Craver, MD as Consulting Physician (Gastroenterology) Martinique, Peter M, MD as Consulting Physician (Cardiology) Megan Salon, MD as Consulting Physician (Gynecology)  Extended Emergency Contact Information Primary Emergency Contact: [name], [place] Phone: [phone] Relation: Friend  Code Status:  Full Code Goals of care: Advanced Directive information Advanced Directives 03/31/2018  Does Patient Have a Medical Advance Directive? Yes  Type of Advance Directive Living will  Does patient want to make changes to medical advance directive? No - Patient declined  Copy of [place] in Chart? -  Would patient like information on creating a medical advance directive? -     Chief Complaint  Patient presents with  . Acute Visit    (R) chest wall pain    HPI:  Pt is a 84 y.o. female seen today for an acute visit for  pain right lower chest wall and chest pain. The patient stated her pain worsens with deep breath, laughing, body movement. Pain was reproduced with deep breath, body movement, coughing, laughing. The patient stated she may fall out of bed last night. She denied SOB, palpitation, nausea, vomiting, abd pain, constipation, diarrhea, dysuria, urinary frequency or urgency. She is afebrile, no  O2 desaturation. Hx of kyphoscoliosis. HTN, elevated sbp today in 160s, on Losartan 50mg  qd. She denied headache, dizziness, change of vision, or focal weakness.    Past Medical History:  Diagnosis Date  . Contact dermatitis and other eczema, due to unspecified cause   . Cortical senile cataract   . GERD (gastroesophageal reflux disease) 02/17/2013  . Hyperparathyroidism, unspecified ([place]) 03/26/2010  . Irritable bowel syndrome   . Keratoconjunctivitis sicca, not specified as Sjogren's   . Loss of weight   . Macular degeneration (senile) of retina, unspecified   . Memory loss   . Other and unspecified hyperlipidemia   . Other malaise and fatigue 06/17/2010  . Pain in joint, site unspecified   . Pathologic fracture of vertebrae   . Reflux esophagitis   . Scoliosis (and kyphoscoliosis), idiopathic   . Senile osteoporosis   . Type II or unspecified type diabetes mellitus without mention of complication, uncontrolled   . Unspecified essential hypertension   . Unspecified hereditary and idiopathic peripheral neuropathy   . Unspecified vitamin D deficiency    Past Surgical History:  Procedure Laterality Date  . APPENDECTOMY    . BREAST LUMPECTOMY  02/05/2010   Dr. Christie Beckers  . CATARACT EXTRACTION W/ INTRAOCULAR LENS  IMPLANT, BILATERAL  04/2013   Dr. Bing Plume  . CHOLECYSTECTOMY  2004   Dr. Zella Richer  . COLONOSCOPY  2001   Dr. Collene Mares  . fibroidectomy  1950  . [place]  . KYPHOPLASTY  2010   T10 & T12 Dr. Erline Levine  . MEDIAL PARTIAL KNEE REPLACEMENT Left 2004   Dr. Ronnie Derby  . SPINE SURGERY     vertebroplasty  T10, T12  . SPINE SURGERY  11/02/2001    C4-5 & C5-6 Titanium Plate Placed Dr. Carloyn Manner  . THYROIDECTOMY, PARTIAL Left 2007  . TOTAL KNEE ARTHROPLASTY Right 2009   Dr. Alvan Dame     Allergies  Allergen Reactions  . Bactrim     unknown  . Betadine [Povidone Iodine]     unknown  . Metformin And Related     Hurt stomach  . Nutmeg Oil (Myristica Oil)     unknown    . Sulfa Antibiotics     unknown    Outpatient Encounter Medications as of 03/31/2018  Medication Sig  . acetaminophen (TYLENOL) 325 MG tablet Take 650 mg by mouth every 8 (eight) hours as needed for mild pain or moderate pain.  . carboxymethylcellul-glycerin (OPTIVE) 0.5-0.9 % ophthalmic solution Place 2 drops into both eyes every 8 (eight) hours as needed for dry eyes.  Marland Kitchen losartan (COZAAR) 50 MG tablet TAKE 1 TABLET ONCE DAILY TO CONTROL BLOOD PRESSURE.  . metFORMIN (GLUCOPHAGE) 500 MG tablet Take 500 mg by mouth at bedtime.  . Multiple Vitamins-Minerals (ICAPS) TABS Take 1 tablet by mouth daily.  . [DISCONTINUED] carboxymethylcellulose (REFRESH PLUS) 0.5 % SOLN Place 2 drops into both eyes every 8 (eight) hours as needed.  . [DISCONTINUED] metFORMIN (GLUCOPHAGE) 500 MG tablet Take 1 tablet (500 mg total) by mouth daily with breakfast. (Patient taking differently: Take 500 mg by mouth at bedtime. )   No facility-administered encounter medications on file as of 03/31/2018.     Review of Systems  Immunization History  Administered Date(s) Administered  . Influenza Whole 03/30/2012, 04/13/2013  . Influenza-Unspecified 04/17/2014, 03/29/2015  . Pneumococcal Polysaccharide-23 06/30/2001  . Td 06/30/2001   Pertinent  Health Maintenance Due  Topic Date Due  . PNA vac Low Risk Adult (2 of 2 - PCV13) 06/30/2002  . FOOT EXAM  12/21/2015  . OPHTHALMOLOGY EXAM  04/02/2016  . INFLUENZA VACCINE  07/29/2018 (Originally 01/28/2018)  . HEMOGLOBIN A1C  05/22/2018  . DEXA SCAN  Completed   Fall Risk  11/24/2017 07/09/2017 11/01/2015 07/26/2015 06/21/2015  Falls in the past year? No No No No No   Functional Status Survey:    Vitals:   03/31/18 0930  BP: (!) 160/80  Pulse: 76  Resp: 18  Temp: 98.2 F (36.8 C)  SpO2: 98%  Weight: 112 lb 6.4 oz (51 kg)  Height: 5\' 3"  (1.6 m)   Body mass index is 19.91 kg/m. Physical Exam  Labs reviewed: Recent Labs    07/10/17 11/19/17 12/29/17  NA 136*  140 138  K 4.3 4.3 4.1  CL  --  104 104  CO2  --  30 26  BUN 16 18 10   CREATININE 0.9 0.8 0.7  CALCIUM  --  11.3 10.7   Recent Labs    07/10/17 11/19/17 12/29/17  AST 15 21 18   ALT 10 16 15   ALKPHOS 62 65 74  ALBUMIN  --  4.2 3.6   Recent Labs    07/10/17 12/29/17  WBC 10.6 12.1  HGB 12.8 12.1  HCT 36 34*  PLT 297 277   Lab Results  Component Value Date   TSH 0.45 08/13/2017   Lab Results  Component Value Date   HGBA1C 7.5 11/19/2017   Lab Results  Component Value Date   CHOL 198 11/19/2017   HDL 78 (A) 11/19/2017   LDLCALC 106 11/19/2017   TRIG 53 11/19/2017    Significant Diagnostic Results in last 30 days:  No results found.  Assessment/Plan 1. Right-sided chest pain   2. Right-sided chest wall pain     Family/ staff Communication:  Labs/tests ordered:   This encounter was created in error - please disregard. This encounter was created in error - please disregard.

## 2018-03-31 NOTE — Assessment & Plan Note (Signed)
X-ray right ribs to r/o traumatic rib fracture.

## 2018-03-31 NOTE — Assessment & Plan Note (Signed)
Elevated Sbp today, asymptomatic, continue Losartan 50mg  qd, VS q shift x 72 hours.

## 2018-03-31 NOTE — Assessment & Plan Note (Signed)
CBC/diff, CXR ap view to r/o infectious etiology, VS qshift x 72hrs. X-ray thoracic spine in setting of Hx of kyphoscoliosis and fell out of bed last night. Will schedule Tylenol 650mg  tid for 2 weeks. Observe.

## 2018-03-31 NOTE — Progress Notes (Signed)
Location:   [facility] Room Number: 098 Place of Service:  ALF (13) Provider: Lennie Odor Stacia Feazell NP  Blondine Hottel X, NP  Patient Care Team: Khalif Stender X, NP as PCP - General (Internal Medicine) [place] Tyshauna Finkbiner X, NP as Nurse Practitioner (Nurse Practitioner) Jacelyn Pi, MD as Consulting Physician (Endocrinology) Calvert Cantor, MD as Consulting Physician (Ophthalmology) Vickey Huger, MD as Consulting Physician (Orthopedic Surgery) Suella Broad, MD as Consulting Physician (Physical Medicine and Rehabilitation) Erline Levine, MD as Consulting Physician (Neurosurgery) Juanita Craver, MD as Consulting Physician (Gastroenterology) Martinique, Peter M, MD as Consulting Physician (Cardiology) Megan Salon, MD as Consulting Physician (Gynecology)  Extended Emergency Contact Information Primary Emergency Contact: [name], [place] Phone: [phone] Relation: Friend  Code Status: DNR Goals of care: Advanced Directive information Advanced Directives 03/31/2018  Does Patient Have a Medical Advance Directive? Yes  Type of Advance Directive Living will  Does patient want to make changes to medical advance directive? No - Patient declined  Copy of [place] in Chart? -  Would patient like information on creating a medical advance directive? -     Chief Complaint  Patient presents with  . Acute Visit    (R) chest wall pain    HPI:  Pt is a 84 y.o. female seen today for an acute visit for pain right lower chest wall and chest pain. The patient stated her pain worsens with deep breath, laughing, body movement. Pain was reproduced with deep breath, body movement, coughing, laughing. The patient stated she may fall out of bed last night. She denied SOB, palpitation, nausea, vomiting, abd pain, constipation, diarrhea, dysuria, urinary frequency or urgency. She is afebrile, no O2 desaturation. Hx of  kyphoscoliosis. HTN, elevated sbp today in 160s, on Losartan 50mg  qd. She denied headache, dizziness, change of vision, or focal weakness.    Past Medical History:  Diagnosis Date  . Contact dermatitis and other eczema, due to unspecified cause   . Cortical senile cataract   . GERD (gastroesophageal reflux disease) 02/17/2013  . Hyperparathyroidism, unspecified ([place]) 03/26/2010  . Irritable bowel syndrome   . Keratoconjunctivitis sicca, not specified as Sjogren's   . Loss of weight   . Macular degeneration (senile) of retina, unspecified   . Memory loss   . Other and unspecified hyperlipidemia   . Other malaise and fatigue 06/17/2010  . Pain in joint, site unspecified   . Pathologic fracture of vertebrae   . Reflux esophagitis   . Scoliosis (and kyphoscoliosis), idiopathic   . Senile osteoporosis   . Type II or unspecified type diabetes mellitus without mention of complication, uncontrolled   . Unspecified essential hypertension   . Unspecified hereditary and idiopathic peripheral neuropathy   . Unspecified vitamin D deficiency    Past Surgical History:  Procedure Laterality Date  . APPENDECTOMY    . BREAST LUMPECTOMY  02/05/2010   Dr. Christie Beckers  . CATARACT EXTRACTION W/ INTRAOCULAR LENS  IMPLANT, BILATERAL  04/2013   Dr. Bing Plume  . CHOLECYSTECTOMY  2004   Dr. Zella Richer  . COLONOSCOPY  2001   Dr. Collene Mares  . fibroidectomy  1950  . [place]  . KYPHOPLASTY  2010   T10 & T12 Dr. Erline Levine  . MEDIAL PARTIAL KNEE REPLACEMENT Left 2004   Dr. Ronnie Derby  . SPINE SURGERY     vertebroplasty T10, T12  . SPINE  SURGERY  11/02/2001    C4-5 & C5-6 Titanium Plate Placed Dr. Carloyn Manner  . THYROIDECTOMY, PARTIAL Left 2007  . TOTAL KNEE ARTHROPLASTY Right 2009   Dr. Alvan Dame     Allergies  Allergen Reactions  . Bactrim     unknown  . Betadine [Povidone Iodine]     unknown  . Metformin And Related     Hurt stomach  . Nutmeg Oil (Myristica Oil)     unknown  . Sulfa Antibiotics      unknown    Allergies as of 03/31/2018      Reactions   Bactrim    unknown   Betadine [povidone Iodine]    unknown   Metformin And Related    Hurt stomach   Nutmeg Oil (myristica Oil)    unknown   Sulfa Antibiotics    unknown      Medication List        Accurate as of 03/31/18 11:27 AM. Always use your most recent med list.          acetaminophen 325 MG tablet Commonly known as:  TYLENOL Take 650 mg by mouth every 8 (eight) hours as needed for mild pain or moderate pain.   ICAPS Tabs Take 1 tablet by mouth daily.   losartan 50 MG tablet Commonly known as:  COZAAR TAKE 1 TABLET ONCE DAILY TO CONTROL BLOOD PRESSURE.   metFORMIN 500 MG tablet Commonly known as:  GLUCOPHAGE Take 500 mg by mouth at bedtime.   OPTIVE 0.5-0.9 % ophthalmic solution Generic drug:  carboxymethylcellul-glycerin Place 2 drops into both eyes every 8 (eight) hours as needed for dry eyes.       Review of Systems  Constitutional: Negative for activity change, appetite change, chills, diaphoresis, fatigue and fever.  HENT: Positive for hearing loss. Negative for congestion and voice change.   Respiratory: Negative for cough, chest tightness, shortness of breath and wheezing.   Cardiovascular: Positive for chest pain and leg swelling. Negative for palpitations.  Gastrointestinal: Negative for abdominal distention, abdominal pain, constipation, diarrhea, nausea and vomiting.  Genitourinary: Negative for difficulty urinating and urgency.  Musculoskeletal: Positive for arthralgias and gait problem. Negative for joint swelling.  Skin: Negative for color change.  Neurological: Negative for dizziness, speech difficulty, weakness and headaches.       Memory lapses.   Psychiatric/Behavioral: Negative for agitation, behavioral problems, confusion, hallucinations and sleep disturbance. The patient is not nervous/anxious.     Immunization History  Administered Date(s) Administered  . Influenza Whole  03/30/2012, 04/13/2013  . Influenza-Unspecified 04/17/2014, 03/29/2015  . Pneumococcal Polysaccharide-23 06/30/2001  . Td 06/30/2001   Pertinent  Health Maintenance Due  Topic Date Due  . PNA vac Low Risk Adult (2 of 2 - PCV13) 06/30/2002  . FOOT EXAM  12/21/2015  . OPHTHALMOLOGY EXAM  04/02/2016  . INFLUENZA VACCINE  07/29/2018 (Originally 01/28/2018)  . HEMOGLOBIN A1C  05/22/2018  . DEXA SCAN  Completed   Fall Risk  11/24/2017 07/09/2017 11/01/2015 07/26/2015 06/21/2015  Falls in the past year? No No No No No   Functional Status Survey:    Vitals:   03/31/18 0930  BP: (!) 160/80  Pulse: 76  Resp: 18  Temp: 98.2 F (36.8 C)  SpO2: 98%  Weight: 112 lb 6.4 oz (51 kg)  Height: 5\' 3"  (1.6 m)   Body mass index is 19.91 kg/m. Physical Exam  Constitutional: She appears well-developed and well-nourished.  HENT:  Head: Normocephalic and atraumatic.  Eyes: Pupils are equal,  round, and reactive to light. EOM are normal.  Neck: Normal range of motion. Neck supple. No JVD present. No thyromegaly present.  Cardiovascular: Normal rate and regular rhythm.  Murmur heard. Pulmonary/Chest: Effort normal. She has no wheezes. She has no rales.  Abdominal: Soft. She exhibits no distension. There is no tenderness. There is no rebound and no guarding.  Musculoskeletal: She exhibits edema, tenderness and deformity.  Trace edema BLE. Kyphoscoliosis. Ambulates with walker. Right lower chest wall and chest pain palpated.   Neurological: She is alert. No cranial nerve deficit. She exhibits normal muscle tone. Coordination normal.  Oriented to person and place.   Skin: Skin is warm and dry.  Psychiatric: She has a normal mood and affect. Her behavior is normal.    Labs reviewed: Recent Labs    07/10/17 11/19/17 12/29/17  NA 136* 140 138  K 4.3 4.3 4.1  CL  --  104 104  CO2  --  30 26  BUN 16 18 10   CREATININE 0.9 0.8 0.7  CALCIUM  --  11.3 10.7   Recent Labs    07/10/17 11/19/17 12/29/17    AST 15 21 18   ALT 10 16 15   ALKPHOS 62 65 74  ALBUMIN  --  4.2 3.6   Recent Labs    07/10/17 12/29/17  WBC 10.6 12.1  HGB 12.8 12.1  HCT 36 34*  PLT 297 277   Lab Results  Component Value Date   TSH 0.45 08/13/2017   Lab Results  Component Value Date   HGBA1C 7.5 11/19/2017   Lab Results  Component Value Date   CHOL 198 11/19/2017   HDL 78 (A) 11/19/2017   LDLCALC 106 11/19/2017   TRIG 53 11/19/2017    Significant Diagnostic Results in last 30 days:  No results found.  Assessment/Plan: Right-sided chest wall pain X-ray right ribs to r/o traumatic rib fracture.   Right-sided chest pain CBC/diff, CXR ap view to r/o infectious etiology, VS qshift x 72hrs. X-ray thoracic spine in setting of Hx of kyphoscoliosis and fell out of bed last night. Will schedule Tylenol 650mg  tid for 2 weeks. Observe.   Essential hypertension Elevated Sbp today, asymptomatic, continue Losartan 50mg  qd, VS q shift x 72 hours.     Family/ staff Communication: plan of care reviewed with the patient and charge nurse.   Labs/tests ordered:  CBC/diff, CXR, X-ray R ribs, X-ray thoracic spine  Time spend 25 minutes.

## 2018-04-01 LAB — CBC AND DIFFERENTIAL
HCT: 35 — AB (ref 36–46)
Hemoglobin: 11.8 — AB (ref 12.0–16.0)
Platelets: 236 (ref 150–399)
WBC: 7.8

## 2018-04-05 ENCOUNTER — Other Ambulatory Visit: Payer: Self-pay | Admitting: *Deleted

## 2018-04-06 ENCOUNTER — Encounter: Payer: Self-pay | Admitting: Nurse Practitioner

## 2018-04-06 NOTE — Progress Notes (Deleted)
Assessment/Plan 1. Right-sided chest pain   2. Right-sided chest wall pain     Family/ staff Communication: ***  Labs/tests ordered:    Location:  [place] Room Number: 546 Place of Service:  ALF (13) Provider:  Mast, Lennie Odor  NP  Mast, Man X, NP  Patient Care Team: Mast, Man X, NP as PCP - General (Internal Medicine) [place] Mast, Man X, NP as Nurse Practitioner (Nurse Practitioner) Jacelyn Pi, MD as Consulting Physician (Endocrinology) Calvert Cantor, MD as Consulting Physician (Ophthalmology) Vickey Huger, MD as Consulting Physician (Orthopedic Surgery) Suella Broad, MD as Consulting Physician (Physical Medicine and Rehabilitation) Erline Levine, MD as Consulting Physician (Neurosurgery) Juanita Craver, MD as Consulting Physician (Gastroenterology) Martinique, Peter M, MD as Consulting Physician (Cardiology) Megan Salon, MD as Consulting Physician (Gynecology)  Extended Emergency Contact Information Primary Emergency Contact: [name], [place] of [place] Phone: [phone] Relation: Friend  Code Status:  Full Code Goals of care: Advanced Directive information Advanced Directives 03/31/2018  Does Patient Have a Medical Advance Directive? Yes  Type of Advance Directive Living will  Does patient want to make changes to medical advance directive? No - Patient declined  Copy of [place] in Chart? -  Would patient like information on creating a medical advance directive? -     Chief Complaint  Patient presents with  . Acute Visit    (R) chest wall pain    HPI:  Pt is a 84 y.o. female seen today for an acute visit for  pain right lower chest wall and chest pain. The patient stated her pain worsens with deep breath, laughing, body movement. Pain was reproduced with deep breath, body movement, coughing, laughing. The patient stated she may fall out of bed last  night. She denied SOB, palpitation, nausea, vomiting, abd pain, constipation, diarrhea, dysuria, urinary frequency or urgency. She is afebrile, no O2 desaturation. Hx of kyphoscoliosis. HTN, elevated sbp today in 160s, on Losartan 50mg  qd. She denied headache, dizziness, change of vision, or focal weakness.    Past Medical History:  Diagnosis Date  . Contact dermatitis and other eczema, due to unspecified cause   . Cortical senile cataract   . GERD (gastroesophageal reflux disease) 02/17/2013  . Hyperparathyroidism, unspecified ([place]) 03/26/2010  . Irritable bowel syndrome   . Keratoconjunctivitis sicca, not specified as Sjogren's   . Loss of weight   . Macular degeneration (senile) of retina, unspecified   . Memory loss   . Other and unspecified hyperlipidemia   . Other malaise and fatigue 06/17/2010  . Pain in joint, site unspecified   . Pathologic fracture of vertebrae   . Reflux esophagitis   . Scoliosis (and kyphoscoliosis), idiopathic   . Senile osteoporosis   . Type II or unspecified type diabetes mellitus without mention of complication, uncontrolled   . Unspecified essential hypertension   . Unspecified hereditary and idiopathic peripheral neuropathy   . Unspecified vitamin D deficiency    Past Surgical History:  Procedure Laterality Date  . APPENDECTOMY    . BREAST LUMPECTOMY  02/05/2010   Dr. Christie Beckers  . CATARACT EXTRACTION W/ INTRAOCULAR LENS  IMPLANT, BILATERAL  04/2013   Dr. Bing Plume  . CHOLECYSTECTOMY  2004   Dr. Zella Richer  . COLONOSCOPY  2001   Dr. Collene Mares  . fibroidectomy  1950  . [place]  . KYPHOPLASTY  2010   T10 &  T12 Dr. Erline Levine  . MEDIAL PARTIAL KNEE REPLACEMENT Left 2004   Dr. Ronnie Derby  . SPINE SURGERY     vertebroplasty T10, T12  . SPINE SURGERY  11/02/2001    C4-5 & C5-6 Titanium Plate Placed Dr. Carloyn Manner  . THYROIDECTOMY, PARTIAL Left 2007  . TOTAL KNEE ARTHROPLASTY Right 2009   Dr. Alvan Dame     Allergies  Allergen Reactions  . Bactrim      unknown  . Betadine [Povidone Iodine]     unknown  . Metformin And Related     Hurt stomach  . Nutmeg Oil (Myristica Oil)     unknown  . Sulfa Antibiotics     unknown    Outpatient Encounter Medications as of 03/31/2018  Medication Sig  . acetaminophen (TYLENOL) 325 MG tablet Take 650 mg by mouth every 8 (eight) hours as needed for mild pain or moderate pain.  . carboxymethylcellul-glycerin (OPTIVE) 0.5-0.9 % ophthalmic solution Place 2 drops into both eyes every 8 (eight) hours as needed for dry eyes.  Marland Kitchen losartan (COZAAR) 50 MG tablet TAKE 1 TABLET ONCE DAILY TO CONTROL BLOOD PRESSURE.  . metFORMIN (GLUCOPHAGE) 500 MG tablet Take 500 mg by mouth at bedtime.  . Multiple Vitamins-Minerals (ICAPS) TABS Take 1 tablet by mouth daily.  . [DISCONTINUED] carboxymethylcellulose (REFRESH PLUS) 0.5 % SOLN Place 2 drops into both eyes every 8 (eight) hours as needed.  . [DISCONTINUED] metFORMIN (GLUCOPHAGE) 500 MG tablet Take 1 tablet (500 mg total) by mouth daily with breakfast. (Patient taking differently: Take 500 mg by mouth at bedtime. )   No facility-administered encounter medications on file as of 03/31/2018.     Review of Systems  Immunization History  Administered Date(s) Administered  . Influenza Whole 03/30/2012, 04/13/2013  . Influenza-Unspecified 04/17/2014, 03/29/2015  . Pneumococcal Polysaccharide-23 06/30/2001  . Td 06/30/2001   Pertinent  Health Maintenance Due  Topic Date Due  . PNA vac Low Risk Adult (2 of 2 - PCV13) 06/30/2002  . FOOT EXAM  12/21/2015  . OPHTHALMOLOGY EXAM  04/02/2016  . INFLUENZA VACCINE  07/29/2018 (Originally 01/28/2018)  . HEMOGLOBIN A1C  05/22/2018  . DEXA SCAN  Completed   Fall Risk  11/24/2017 07/09/2017 11/01/2015 07/26/2015 06/21/2015  Falls in the past year? No No No No No   Functional Status Survey:    Vitals:   03/31/18 0930  BP: 146/78  Pulse: 76  Resp: 18  Temp: 98.2 F (36.8 C)  SpO2: 98%  Weight: 112 lb 6.4 oz (51 kg)    Height: 5\' 3"  (1.6 m)   Body mass index is 19.91 kg/m. Physical Exam  Labs reviewed: Recent Labs    07/10/17 11/19/17 12/29/17  NA 136* 140 138  K 4.3 4.3 4.1  CL  --  104 104  CO2  --  30 26  BUN 16 18 10   CREATININE 0.9 0.8 0.7  CALCIUM  --  11.3 10.7   Recent Labs    07/10/17 11/19/17 12/29/17  AST 15 21 18   ALT 10 16 15   ALKPHOS 62 65 74  ALBUMIN  --  4.2 3.6   Recent Labs    07/10/17 12/29/17  WBC 10.6 12.1  HGB 12.8 12.1  HCT 36 34*  PLT 297 277   Lab Results  Component Value Date   TSH 0.45 08/13/2017   Lab Results  Component Value Date   HGBA1C 7.5 11/19/2017   Lab Results  Component Value Date   CHOL 198 11/19/2017  HDL 78 (A) 11/19/2017   LDLCALC 106 11/19/2017   TRIG 53 11/19/2017    Significant Diagnostic Results in last 30 days:  No results found.

## 2018-04-07 ENCOUNTER — Encounter: Payer: Self-pay | Admitting: Nurse Practitioner

## 2018-04-07 NOTE — Progress Notes (Deleted)
Assessment/Plan 1. Right-sided chest pain   2. Right-sided chest wall pain     Family/ staff Communication: ***  Labs/tests ordered:    Location:   Room Number: 993 Place of Service:  ALF (13) Provider:  Mast, Lennie Odor  NP  Mast, Man X, NP  Patient Care Team: Mast, Man X, NP as PCP - General (Internal Medicine) [place] Mast, Man X, NP as Nurse Practitioner (Nurse Practitioner) Jacelyn Pi, MD as Consulting Physician (Endocrinology) Calvert Cantor, MD as Consulting Physician (Ophthalmology) Vickey Huger, MD as Consulting Physician (Orthopedic Surgery) Suella Broad, MD as Consulting Physician (Physical Medicine and Rehabilitation) Erline Levine, MD as Consulting Physician (Neurosurgery) Juanita Craver, MD as Consulting Physician (Gastroenterology) [place]ique, Peter M, MD as Consulting Physician (Cardiology) Megan Salon, MD as Consulting Physician (Gynecology)  Extended Emergency Contact Information Primary Emergency Contact: [name], [place] of [place] Phone: [phone] Relation: Friend  Code Status:  Full Code Goals of care: Advanced Directive information Advanced Directives 03/31/2018  Does Patient Have a Medical Advance Directive? Yes  Type of Advance Directive Living will  Does patient want to make changes to medical advance directive? No - Patient declined  Copy of [place] in Chart? -  Would patient like information on creating a medical advance directive? -     Chief Complaint  Patient presents with  . Acute Visit    (R) chest wall pain    HPI:  Pt is a 84 y.o. female seen today for an acute visit for  pain right lower chest wall and chest pain. The patient stated her pain worsens with deep breath, laughing, body movement. Pain was reproduced with deep breath, body movement, coughing, laughing. The patient stated she may fall out of bed last  night. She denied SOB, palpitation, nausea, vomiting, abd pain, constipation, diarrhea, dysuria, urinary frequency or urgency. She is afebrile, no O2 desaturation. Hx of kyphoscoliosis. HTN, elevated sbp today in 160s, on Losartan 50mg  qd. She denied headache, dizziness, change of vision, or focal weakness.    Past Medical History:  Diagnosis Date  . Contact dermatitis and other eczema, due to unspecified cause   . Cortical senile cataract   . GERD (gastroesophageal reflux disease) 02/17/2013  . Hyperparathyroidism, unspecified ([place]) 03/26/2010  . Irritable bowel syndrome   . Keratoconjunctivitis sicca, not specified as Sjogren's   . Loss of weight   . Macular degeneration (senile) of retina, unspecified   . Memory loss   . Other and unspecified hyperlipidemia   . Other malaise and fatigue 06/17/2010  . Pain in joint, site unspecified   . Pathologic fracture of vertebrae   . Reflux esophagitis   . Scoliosis (and kyphoscoliosis), idiopathic   . Senile osteoporosis   . Type II or unspecified type diabetes mellitus without mention of complication, uncontrolled   . Unspecified essential hypertension   . Unspecified hereditary and idiopathic peripheral neuropathy   . Unspecified vitamin D deficiency    Past Surgical History:  Procedure Laterality Date  . APPENDECTOMY    . BREAST LUMPECTOMY  02/05/2010   Dr. Christie Beckers  . CATARACT EXTRACTION W/ INTRAOCULAR LENS  IMPLANT, BILATERAL  04/2013   Dr. Bing Plume  . CHOLECYSTECTOMY  2004   Dr. Zella Richer  . COLONOSCOPY  2001   Dr. Collene Mares  . fibroidectomy  1950  . [place]  . KYPHOPLASTY  2010   T10 &  T12 Dr. Erline Levine  . MEDIAL PARTIAL KNEE REPLACEMENT Left 2004   Dr. Ronnie Derby  . SPINE SURGERY     vertebroplasty T10, T12  . SPINE SURGERY  11/02/2001    C4-5 & C5-6 Titanium Plate Placed Dr. Carloyn Manner  . THYROIDECTOMY, PARTIAL Left 2007  . TOTAL KNEE ARTHROPLASTY Right 2009   Dr. Alvan Dame     Allergies  Allergen Reactions  . Bactrim      unknown  . Betadine [Povidone Iodine]     unknown  . Metformin And Related     Hurt stomach  . Nutmeg Oil (Myristica Oil)     unknown  . Sulfa Antibiotics     unknown    Outpatient Encounter Medications as of 03/31/2018  Medication Sig  . acetaminophen (TYLENOL) 325 MG tablet Take 650 mg by mouth every 8 (eight) hours as needed for mild pain or moderate pain.  . carboxymethylcellul-glycerin (OPTIVE) 0.5-0.9 % ophthalmic solution Place 2 drops into both eyes every 8 (eight) hours as needed for dry eyes.  Marland Kitchen losartan (COZAAR) 50 MG tablet TAKE 1 TABLET ONCE DAILY TO CONTROL BLOOD PRESSURE.  . metFORMIN (GLUCOPHAGE) 500 MG tablet Take 500 mg by mouth at bedtime.  . Multiple Vitamins-Minerals (ICAPS) TABS Take 1 tablet by mouth daily.  . [DISCONTINUED] carboxymethylcellulose (REFRESH PLUS) 0.5 % SOLN Place 2 drops into both eyes every 8 (eight) hours as needed.  . [DISCONTINUED] metFORMIN (GLUCOPHAGE) 500 MG tablet Take 1 tablet (500 mg total) by mouth daily with breakfast. (Patient taking differently: Take 500 mg by mouth at bedtime. )   No facility-administered encounter medications on file as of 03/31/2018.     Review of Systems  Immunization History  Administered Date(s) Administered  . Influenza Whole 03/30/2012, 04/13/2013  . Influenza-Unspecified 04/17/2014, 03/29/2015  . Pneumococcal Polysaccharide-23 06/30/2001  . Td 06/30/2001   Pertinent  Health Maintenance Due  Topic Date Due  . PNA vac Low Risk Adult (2 of 2 - PCV13) 06/30/2002  . FOOT EXAM  12/21/2015  . OPHTHALMOLOGY EXAM  04/02/2016  . INFLUENZA VACCINE  07/29/2018 (Originally 01/28/2018)  . HEMOGLOBIN A1C  05/22/2018  . DEXA SCAN  Completed   Fall Risk  11/24/2017 07/09/2017 11/01/2015 07/26/2015 06/21/2015  Falls in the past year? No No No No No   Functional Status Survey:    Vitals:   03/31/18 0930  BP: 146/78  Pulse: 76  Resp: 18  Temp: 98.2 F (36.8 C)  SpO2: 98%  Weight: 112 lb 6.4 oz (51 kg)    Height: 5\' 3"  (1.6 m)   Body mass index is 19.91 kg/m. Physical Exam  Labs reviewed: Recent Labs    07/10/17 11/19/17 12/29/17  NA 136* 140 138  K 4.3 4.3 4.1  CL  --  104 104  CO2  --  30 26  BUN 16 18 10   CREATININE 0.9 0.8 0.7  CALCIUM  --  11.3 10.7   Recent Labs    07/10/17 11/19/17 12/29/17  AST 15 21 18   ALT 10 16 15   ALKPHOS 62 65 74  ALBUMIN  --  4.2 3.6   Recent Labs    07/10/17 12/29/17  WBC 10.6 12.1  HGB 12.8 12.1  HCT 36 34*  PLT 297 277   Lab Results  Component Value Date   TSH 0.45 08/13/2017   Lab Results  Component Value Date   HGBA1C 7.5 11/19/2017   Lab Results  Component Value Date   CHOL 198 11/19/2017  HDL 78 (A) 11/19/2017   LDLCALC 106 11/19/2017   TRIG 53 11/19/2017    Significant Diagnostic Results in last 30 days:  No results found.   This encounter was created in error - please disregard.

## 2018-04-07 NOTE — Progress Notes (Signed)
This encounter was created in error - please disregard.

## 2018-04-07 NOTE — Progress Notes (Signed)
Location:  [place] Room Number: 270 Place of Service:  SNF (31) Provider:ManXie Smiley Birr NP  Charnise Lovan X, NP  Patient Care Team: Gordy Goar X, NP as PCP - General (Internal Medicine) [place] Dorella Laster X, NP as Nurse Practitioner (Nurse Practitioner) Jacelyn Pi, MD as Consulting Physician (Endocrinology) Calvert Cantor, MD as Consulting Physician (Ophthalmology) Vickey Huger, MD as Consulting Physician (Orthopedic Surgery) Suella Broad, MD as Consulting Physician (Physical Medicine and Rehabilitation) Erline Levine, MD as Consulting Physician (Neurosurgery) Juanita Craver, MD as Consulting Physician (Gastroenterology) Martinique, Peter M, MD as Consulting Physician (Cardiology) Megan Salon, MD as Consulting Physician (Gynecology)  Extended Emergency Contact Information Primary Emergency Contact: [name], [place] Phone: [phone] Relation: Friend  Code Status:  DNR Goals of care: Advanced Directive information Advanced Directives 03/31/2018  Does Patient Have a Medical Advance Directive? Yes  Type of Advance Directive Living will  Does patient want to make changes to medical advance directive? No - Patient declined  Copy of [place] in Chart? -  Would patient like information on creating a medical advance directive? -     Chief Complaint  Patient presents with  . Acute Visit    (R) Chest wall pain    HPI:  Pt is a 84 y.o. female seen today for an acute visit for pain right lower chest wall and chest pain. The patient stated her pain worsens with deep breath, laughing, body movement. Pain was reproduced with deep breath, body movement, coughing, laughing. The patient stated she may fall out of bed last night. She denied SOB, palpitation, nausea, vomiting, abd pain, constipation, diarrhea, dysuria, urinary frequency or urgency. She is afebrile, no O2  desatutation. Hx of kyphoscoliosis, HTN,elevated SBP today in 160's, on Losartan 50mg  qd. She denied headache, dizziness, change of vision, or focal weakness.   Past Medical History:  Diagnosis Date  . Contact dermatitis and other eczema, due to unspecified cause   . Cortical senile cataract   . GERD (gastroesophageal reflux disease) 02/17/2013  . Hyperparathyroidism, unspecified ([place]) 03/26/2010  . Irritable bowel syndrome   . Keratoconjunctivitis sicca, not specified as Sjogren's   . Loss of weight   . Macular degeneration (senile) of retina, unspecified   . Memory loss   . Other and unspecified hyperlipidemia   . Other malaise and fatigue 06/17/2010  . Pain in joint, site unspecified   . Pathologic fracture of vertebrae   . Reflux esophagitis   . Scoliosis (and kyphoscoliosis), idiopathic   . Senile osteoporosis   . Type II or unspecified type diabetes mellitus without mention of complication, uncontrolled   . Unspecified essential hypertension   . Unspecified hereditary and idiopathic peripheral neuropathy   . Unspecified vitamin D deficiency    Past Surgical History:  Procedure Laterality Date  . APPENDECTOMY    . BREAST LUMPECTOMY  02/05/2010   Dr. Christie Beckers  . CATARACT EXTRACTION W/ INTRAOCULAR LENS  IMPLANT, BILATERAL  04/2013   Dr. Bing Plume  . CHOLECYSTECTOMY  2004   Dr. Zella Richer  . COLONOSCOPY  2001   Dr. Collene Mares  . fibroidectomy  1950  . [place]  . KYPHOPLASTY  2010   T10 & T12 Dr. Erline Levine  . MEDIAL PARTIAL KNEE REPLACEMENT Left 2004   Dr. Ronnie Derby  . SPINE SURGERY     vertebroplasty T10, T12  . SPINE SURGERY  11/02/2001    C4-5 & C5-6 Titanium Plate Placed Dr. Carloyn Manner  . THYROIDECTOMY, PARTIAL Left 2007  . TOTAL KNEE ARTHROPLASTY Right 2009   Dr. Alvan Dame     Allergies  Allergen Reactions  . Bactrim     unknown  . Betadine [Povidone Iodine]     unknown  . Metformin And Related     Hurt stomach  . Nutmeg Oil (Myristica Oil)     unknown  .  Sulfa Antibiotics     unknown    Outpatient Encounter Medications as of 03/31/2018  Medication Sig  . acetaminophen (TYLENOL) 325 MG tablet Take 650 mg by mouth every 8 (eight) hours as needed for mild pain or moderate pain.  . carboxymethylcellul-glycerin (OPTIVE) 0.5-0.9 % ophthalmic solution Place 2 drops into both eyes every 8 (eight) hours as needed for dry eyes.  Marland Kitchen losartan (COZAAR) 50 MG tablet TAKE 1 TABLET ONCE DAILY TO CONTROL BLOOD PRESSURE.  . metFORMIN (GLUCOPHAGE) 500 MG tablet Take 500 mg by mouth at bedtime.  . Multiple Vitamins-Minerals (ICAPS) TABS Take 1 tablet by mouth daily.   No facility-administered encounter medications on file as of 03/31/2018.     Review of Systems  Constitutional: Negative for activity change, appetite change, chills, diaphoresis, fatigue and fever.  HENT: Positive for hearing loss. Negative for congestion, sore throat and voice change.   Respiratory: Negative for cough, choking, chest tightness, shortness of breath and wheezing.   Cardiovascular: Positive for chest pain and leg swelling. Negative for palpitations.  Gastrointestinal: Negative for abdominal distention, abdominal pain, constipation, diarrhea, nausea and vomiting.  Genitourinary: Negative for difficulty urinating and urgency.  Musculoskeletal: Positive for arthralgias, back pain and gait problem.  Skin: Negative for color change and pallor.  Neurological: Negative for dizziness, speech difficulty, weakness and headaches.       Memory lapses.   Psychiatric/Behavioral: Negative for agitation, behavioral problems, hallucinations and sleep disturbance. The patient is not nervous/anxious.     Constitutional: Negative for activity change, appetite change, chills, diaphoresis, fatiigue and fever. HENT: Positive for hearing loss. Negative for congestion and voice change. Respiratory: Negative for cough, chest tightness, shortness of breath and wheezing. Cardiovascular: Positive for chest  pain and leg swelling. Negative for palpitations. Gastrointestinal: Negative for abdominal distention, abdominal pain, constipation,diarrhea, nausea and vomiting. Genitourinary: Negative for difficulty urinating and urgency. Musculoskeletal: Positive for arthralgias and gait problem. Negative for joint swelling. Skin: Negative for color change. Neurological: Negative for dizziness, speech difficulty, weakness and headaches.       Memory lapses. Psychiatric/Behaviorial: Negative for agitation, behavioral problems, confusion, hallucinations and sleep disturbance. The patient is not nervous/anxious.  Immunization History  Administered Date(s) Administered  . Influenza Whole 03/30/2012, 04/13/2013  . Influenza-Unspecified 04/17/2014, 03/29/2015  . Pneumococcal Polysaccharide-23 06/30/2001  . Td 06/30/2001   Pertinent  Health Maintenance Due  Topic Date Due  . PNA vac Low Risk Adult (2 of 2 - PCV13) 06/30/2002  . FOOT EXAM  12/21/2015  . OPHTHALMOLOGY EXAM  04/02/2016  . INFLUENZA VACCINE  07/29/2018 (Originally 01/28/2018)  . HEMOGLOBIN A1C  05/22/2018  . DEXA SCAN  Completed   Fall Risk  11/24/2017 07/09/2017 11/01/2015 07/26/2015 06/21/2015  Falls in the past year? No No No No No   Functional Status Survey:    Vitals:   03/31/18 1546  BP: (!) 160/80  Pulse: 76  Resp: 18  Temp: 98.2 F (36.8 C)  SpO2: 98%  Weight: 112 lb (50.8 kg)  Height: 5\' 3"  (1.6 m)  Body mass index is 19.84 kg/m. Physical Exam  Constitutional: She appears well-developed and well-nourished.  HENT:  Head: Normocephalic and atraumatic.  Eyes: Pupils are equal, round, and reactive to light. Conjunctivae and EOM are normal.  Neck: Normal range of motion. Neck supple. No JVD present. No thyromegaly present.  Cardiovascular: Normal rate and regular rhythm.  Murmur heard. Pulmonary/Chest: Effort normal. She has no wheezes. She has no rales. She exhibits no tenderness.  Abdominal: Soft. She exhibits no  distension. There is no tenderness. There is no rebound and no guarding.  Musculoskeletal: She exhibits edema and tenderness.  Trace edema BLE. Kyphoscoliosis. Ambulates with walker. Right lower chest wall and chest pain palpated.   Neurological: She is alert.  Oriented to person and place.   Skin: Skin is warm and dry.  Psychiatric: She has a normal mood and affect. Her behavior is normal.    Constitutional: She appears well-developed and well-nourished. HENT: Head: Normocephalic and atraumatic. Eyes: Pupils are equal, round, and reactive to light. EOM are normal. Neck: Normal range of motion. Neck supple. No JVD present. No thyromegaly present. Cardiovascular: Normal rate and regular rhythm. Murmur heard. Pulmonary/Chest: Effort normal. She has no rales. Abdominal: Soft. She exhibits edema, tenderness and deformity. Trace edema BLE. Kyphoscoliosis. Ambulates with walker. Right lower chest wall and chest pain palpated. Neurological: She is alert. No cranial nerve deficit. She exhibits normal muscle tone. Coordination normal. Oriented to person and place. Skin: Skin is warm and dry. Psychiatric: She has normal mood and affect. Her behavior is normal.  Labs reviewed: Recent Labs    07/10/17 11/19/17 12/29/17  NA 136* 140 138  K 4.3 4.3 4.1  CL  --  104 104  CO2  --  30 26  BUN 16 18 10   CREATININE 0.9 0.8 0.7  CALCIUM  --  11.3 10.7   Recent Labs    07/10/17 11/19/17 12/29/17  AST 15 21 18   ALT 10 16 15   ALKPHOS 62 65 74  ALBUMIN  --  4.2 3.6   Recent Labs    07/10/17 12/29/17 04/01/18  WBC 10.6 12.1 7.8  HGB 12.8 12.1 11.8*  HCT 36 34* 35*  PLT 297 277 236   Lab Results  Component Value Date   TSH 0.45 08/13/2017   Lab Results  Component Value Date   HGBA1C 7.5 11/19/2017   Lab Results  Component Value Date   CHOL 198 11/19/2017   HDL 78 (A) 11/19/2017   LDLCALC 106 11/19/2017   TRIG 53 11/19/2017    Significant Diagnostic Results in last 30 days:  No  results found.  Assessment/Plan Right sided chest wall pain X-ray right ribs to r/o traumatic rib fracture.  Right-sided chest pain CBC/diff, CXR ap view to r/o infectious etiology, VS qshift x 72hrs. X-ray thoracic spine iin Setting of HX of Kyphoscoliosis and fell out of bed last night. Will schedule Tylenol 650mg  tid for 2 weeks. Observe.  Essential hypertension Elevated SBP today, asymptomatic, continue Losartan 50 mg qd, VS q Shift x 72 hours.     Family/ staff Communication: Plan of care reviewed with the patient and charge nurse.  Labs/tests ordered:  CBC/diff, CXR, X-ray thoracic spine.  Time spend 25 minutes.

## 2018-04-12 ENCOUNTER — Encounter: Payer: Self-pay | Admitting: Nurse Practitioner

## 2018-04-20 DIAGNOSIS — M859 Disorder of bone density and structure, unspecified: Secondary | ICD-10-CM | POA: Diagnosis not present

## 2018-04-20 DIAGNOSIS — R29898 Other symptoms and signs involving the musculoskeletal system: Secondary | ICD-10-CM | POA: Diagnosis not present

## 2018-04-20 DIAGNOSIS — R29818 Other symptoms and signs involving the nervous system: Secondary | ICD-10-CM | POA: Diagnosis not present

## 2018-04-20 DIAGNOSIS — M67419 Ganglion, unspecified shoulder: Secondary | ICD-10-CM | POA: Diagnosis not present

## 2018-04-20 DIAGNOSIS — H25019 Cortical age-related cataract, unspecified eye: Secondary | ICD-10-CM | POA: Diagnosis not present

## 2018-04-20 DIAGNOSIS — K582 Mixed irritable bowel syndrome: Secondary | ICD-10-CM | POA: Diagnosis not present

## 2018-04-20 DIAGNOSIS — R413 Other amnesia: Secondary | ICD-10-CM | POA: Diagnosis not present

## 2018-04-20 DIAGNOSIS — M5186 Other intervertebral disc disorders, lumbar region: Secondary | ICD-10-CM | POA: Diagnosis not present

## 2018-04-20 DIAGNOSIS — R2681 Unsteadiness on feet: Secondary | ICD-10-CM | POA: Diagnosis not present

## 2018-04-20 DIAGNOSIS — R531 Weakness: Secondary | ICD-10-CM | POA: Diagnosis not present

## 2018-04-20 DIAGNOSIS — R32 Unspecified urinary incontinence: Secondary | ICD-10-CM | POA: Diagnosis not present

## 2018-04-20 DIAGNOSIS — K219 Gastro-esophageal reflux disease without esophagitis: Secondary | ICD-10-CM | POA: Diagnosis not present

## 2018-04-20 DIAGNOSIS — R293 Abnormal posture: Secondary | ICD-10-CM | POA: Diagnosis not present

## 2018-04-20 DIAGNOSIS — M419 Scoliosis, unspecified: Secondary | ICD-10-CM | POA: Diagnosis not present

## 2018-04-20 DIAGNOSIS — M25561 Pain in right knee: Secondary | ICD-10-CM | POA: Diagnosis not present

## 2018-04-20 DIAGNOSIS — E118 Type 2 diabetes mellitus with unspecified complications: Secondary | ICD-10-CM | POA: Diagnosis not present

## 2018-04-20 DIAGNOSIS — M545 Low back pain: Secondary | ICD-10-CM | POA: Diagnosis not present

## 2018-04-20 DIAGNOSIS — M25562 Pain in left knee: Secondary | ICD-10-CM | POA: Diagnosis not present

## 2018-04-20 DIAGNOSIS — M549 Dorsalgia, unspecified: Secondary | ICD-10-CM | POA: Diagnosis not present

## 2018-04-20 DIAGNOSIS — E0590 Thyrotoxicosis, unspecified without thyrotoxic crisis or storm: Secondary | ICD-10-CM | POA: Diagnosis not present

## 2018-04-20 DIAGNOSIS — H3530 Unspecified macular degeneration: Secondary | ICD-10-CM | POA: Diagnosis not present

## 2018-04-20 DIAGNOSIS — M6281 Muscle weakness (generalized): Secondary | ICD-10-CM | POA: Diagnosis not present

## 2018-04-20 DIAGNOSIS — M810 Age-related osteoporosis without current pathological fracture: Secondary | ICD-10-CM | POA: Diagnosis not present

## 2018-04-21 DIAGNOSIS — M6281 Muscle weakness (generalized): Secondary | ICD-10-CM | POA: Diagnosis not present

## 2018-04-21 DIAGNOSIS — M25562 Pain in left knee: Secondary | ICD-10-CM | POA: Diagnosis not present

## 2018-04-21 DIAGNOSIS — M25561 Pain in right knee: Secondary | ICD-10-CM | POA: Diagnosis not present

## 2018-04-21 DIAGNOSIS — H25019 Cortical age-related cataract, unspecified eye: Secondary | ICD-10-CM | POA: Diagnosis not present

## 2018-04-21 DIAGNOSIS — R2681 Unsteadiness on feet: Secondary | ICD-10-CM | POA: Diagnosis not present

## 2018-04-21 DIAGNOSIS — R531 Weakness: Secondary | ICD-10-CM | POA: Diagnosis not present

## 2018-04-23 DIAGNOSIS — M6281 Muscle weakness (generalized): Secondary | ICD-10-CM | POA: Diagnosis not present

## 2018-04-23 DIAGNOSIS — M25561 Pain in right knee: Secondary | ICD-10-CM | POA: Diagnosis not present

## 2018-04-23 DIAGNOSIS — M25562 Pain in left knee: Secondary | ICD-10-CM | POA: Diagnosis not present

## 2018-04-23 DIAGNOSIS — H25019 Cortical age-related cataract, unspecified eye: Secondary | ICD-10-CM | POA: Diagnosis not present

## 2018-04-23 DIAGNOSIS — R2681 Unsteadiness on feet: Secondary | ICD-10-CM | POA: Diagnosis not present

## 2018-04-23 DIAGNOSIS — R531 Weakness: Secondary | ICD-10-CM | POA: Diagnosis not present

## 2018-04-26 DIAGNOSIS — M6281 Muscle weakness (generalized): Secondary | ICD-10-CM | POA: Diagnosis not present

## 2018-04-26 DIAGNOSIS — M25561 Pain in right knee: Secondary | ICD-10-CM | POA: Diagnosis not present

## 2018-04-26 DIAGNOSIS — R531 Weakness: Secondary | ICD-10-CM | POA: Diagnosis not present

## 2018-04-26 DIAGNOSIS — R2681 Unsteadiness on feet: Secondary | ICD-10-CM | POA: Diagnosis not present

## 2018-04-26 DIAGNOSIS — H25019 Cortical age-related cataract, unspecified eye: Secondary | ICD-10-CM | POA: Diagnosis not present

## 2018-04-26 DIAGNOSIS — M25562 Pain in left knee: Secondary | ICD-10-CM | POA: Diagnosis not present

## 2018-04-28 DIAGNOSIS — R2681 Unsteadiness on feet: Secondary | ICD-10-CM | POA: Diagnosis not present

## 2018-04-28 DIAGNOSIS — M25562 Pain in left knee: Secondary | ICD-10-CM | POA: Diagnosis not present

## 2018-04-28 DIAGNOSIS — H25019 Cortical age-related cataract, unspecified eye: Secondary | ICD-10-CM | POA: Diagnosis not present

## 2018-04-28 DIAGNOSIS — R531 Weakness: Secondary | ICD-10-CM | POA: Diagnosis not present

## 2018-04-28 DIAGNOSIS — M6281 Muscle weakness (generalized): Secondary | ICD-10-CM | POA: Diagnosis not present

## 2018-04-28 DIAGNOSIS — M25561 Pain in right knee: Secondary | ICD-10-CM | POA: Diagnosis not present

## 2018-04-29 DIAGNOSIS — R531 Weakness: Secondary | ICD-10-CM | POA: Diagnosis not present

## 2018-04-29 DIAGNOSIS — M6281 Muscle weakness (generalized): Secondary | ICD-10-CM | POA: Diagnosis not present

## 2018-04-29 DIAGNOSIS — M25562 Pain in left knee: Secondary | ICD-10-CM | POA: Diagnosis not present

## 2018-04-29 DIAGNOSIS — R2681 Unsteadiness on feet: Secondary | ICD-10-CM | POA: Diagnosis not present

## 2018-04-29 DIAGNOSIS — M25561 Pain in right knee: Secondary | ICD-10-CM | POA: Diagnosis not present

## 2018-04-29 DIAGNOSIS — H25019 Cortical age-related cataract, unspecified eye: Secondary | ICD-10-CM | POA: Diagnosis not present

## 2018-05-04 DIAGNOSIS — M25562 Pain in left knee: Secondary | ICD-10-CM | POA: Diagnosis not present

## 2018-05-04 DIAGNOSIS — R413 Other amnesia: Secondary | ICD-10-CM | POA: Diagnosis not present

## 2018-05-04 DIAGNOSIS — E0590 Thyrotoxicosis, unspecified without thyrotoxic crisis or storm: Secondary | ICD-10-CM | POA: Diagnosis not present

## 2018-05-04 DIAGNOSIS — R2681 Unsteadiness on feet: Secondary | ICD-10-CM | POA: Diagnosis not present

## 2018-05-04 DIAGNOSIS — R531 Weakness: Secondary | ICD-10-CM | POA: Diagnosis not present

## 2018-05-04 DIAGNOSIS — M25561 Pain in right knee: Secondary | ICD-10-CM | POA: Diagnosis not present

## 2018-05-04 DIAGNOSIS — M6281 Muscle weakness (generalized): Secondary | ICD-10-CM | POA: Diagnosis not present

## 2018-05-04 DIAGNOSIS — R293 Abnormal posture: Secondary | ICD-10-CM | POA: Diagnosis not present

## 2018-05-04 DIAGNOSIS — K582 Mixed irritable bowel syndrome: Secondary | ICD-10-CM | POA: Diagnosis not present

## 2018-05-04 DIAGNOSIS — M859 Disorder of bone density and structure, unspecified: Secondary | ICD-10-CM | POA: Diagnosis not present

## 2018-05-04 DIAGNOSIS — R29818 Other symptoms and signs involving the nervous system: Secondary | ICD-10-CM | POA: Diagnosis not present

## 2018-05-04 DIAGNOSIS — R29898 Other symptoms and signs involving the musculoskeletal system: Secondary | ICD-10-CM | POA: Diagnosis not present

## 2018-05-04 DIAGNOSIS — R32 Unspecified urinary incontinence: Secondary | ICD-10-CM | POA: Diagnosis not present

## 2018-05-04 DIAGNOSIS — M545 Low back pain: Secondary | ICD-10-CM | POA: Diagnosis not present

## 2018-05-04 DIAGNOSIS — K219 Gastro-esophageal reflux disease without esophagitis: Secondary | ICD-10-CM | POA: Diagnosis not present

## 2018-05-05 DIAGNOSIS — M6281 Muscle weakness (generalized): Secondary | ICD-10-CM | POA: Diagnosis not present

## 2018-05-05 DIAGNOSIS — M25562 Pain in left knee: Secondary | ICD-10-CM | POA: Diagnosis not present

## 2018-05-05 DIAGNOSIS — M25561 Pain in right knee: Secondary | ICD-10-CM | POA: Diagnosis not present

## 2018-05-05 DIAGNOSIS — R531 Weakness: Secondary | ICD-10-CM | POA: Diagnosis not present

## 2018-05-05 DIAGNOSIS — M545 Low back pain: Secondary | ICD-10-CM | POA: Diagnosis not present

## 2018-05-05 DIAGNOSIS — R2681 Unsteadiness on feet: Secondary | ICD-10-CM | POA: Diagnosis not present

## 2018-05-06 DIAGNOSIS — M545 Low back pain: Secondary | ICD-10-CM | POA: Diagnosis not present

## 2018-05-06 DIAGNOSIS — M25562 Pain in left knee: Secondary | ICD-10-CM | POA: Diagnosis not present

## 2018-05-06 DIAGNOSIS — M6281 Muscle weakness (generalized): Secondary | ICD-10-CM | POA: Diagnosis not present

## 2018-05-06 DIAGNOSIS — R2681 Unsteadiness on feet: Secondary | ICD-10-CM | POA: Diagnosis not present

## 2018-05-06 DIAGNOSIS — M25561 Pain in right knee: Secondary | ICD-10-CM | POA: Diagnosis not present

## 2018-05-06 DIAGNOSIS — R531 Weakness: Secondary | ICD-10-CM | POA: Diagnosis not present

## 2018-05-07 ENCOUNTER — Encounter: Payer: Self-pay | Admitting: Family Medicine

## 2018-05-07 ENCOUNTER — Non-Acute Institutional Stay: Payer: Medicare Other | Admitting: Family Medicine

## 2018-05-07 DIAGNOSIS — E0841 Diabetes mellitus due to underlying condition with diabetic mononeuropathy: Secondary | ICD-10-CM

## 2018-05-07 DIAGNOSIS — R413 Other amnesia: Secondary | ICD-10-CM | POA: Diagnosis not present

## 2018-05-07 DIAGNOSIS — I10 Essential (primary) hypertension: Secondary | ICD-10-CM | POA: Diagnosis not present

## 2018-05-07 DIAGNOSIS — E1142 Type 2 diabetes mellitus with diabetic polyneuropathy: Secondary | ICD-10-CM

## 2018-05-07 NOTE — Progress Notes (Signed)
Provider:  Alain Honey, MD Location:  [place] Room Number: 947 Place of Service:  ALF (13)  PCP: Mast, Man X, NP Patient Care Team: Mast, Man X, NP as PCP - General (Internal Medicine) [place] Mast, Man X, NP as Nurse Practitioner (Nurse Practitioner) Jacelyn Pi, MD as Consulting Physician (Endocrinology) Calvert Cantor, MD as Consulting Physician (Ophthalmology) Vickey Huger, MD as Consulting Physician (Orthopedic Surgery) Suella Broad, MD as Consulting Physician (Physical Medicine and Rehabilitation) Erline Levine, MD as Consulting Physician (Neurosurgery) Juanita Craver, MD as Consulting Physician (Gastroenterology) Martinique, Peter M, MD as Consulting Physician (Cardiology) Megan Salon, MD as Consulting Physician (Gynecology)  Extended Emergency Contact Information Primary Emergency Contact: [name], [place] Phone: [phone] Relation: Friend  Code Status: Full, Code Goals of Care: Advanced Directive information Advanced Directives 05/07/2018  Does Patient Have a Medical Advance Directive? Yes  Type of Advance Directive Living will  Does patient want to make changes to medical advance directive? No - Patient declined  Copy of [place] in Chart? -  Would patient like information on creating a medical advance directive? -      Chief Complaint  Patient presents with  . Medical Management of Chronic Issues    F/u- HTN, GERD, DM    HPI: Patient is a 84 y.o. female seen today for medical management of chronic problems including: Type 2 diabetes, hypertension, gait disturbance.  Met patient in her room.  She is very pleasant.  She does not exhibit any overt signs of dementia.  She has no specific complaints.  Her last hemoglobin A1c was 6 months ago and it was 7.5.  We talked about diabetes control but 7.5 is a good target for someone age 69.  On the  other hand her blood pressures have not been as well controlled which could be a function of losartan not really lasting 24 hours.  We discussed that and she is willing to make some adjustments  Past Medical History:  Diagnosis Date  . Contact dermatitis and other eczema, due to unspecified cause   . Cortical senile cataract   . GERD (gastroesophageal reflux disease) 02/17/2013  . Hyperparathyroidism, unspecified ([place]) 03/26/2010  . Irritable bowel syndrome   . Keratoconjunctivitis sicca, not specified as Sjogren's   . Loss of weight   . Macular degeneration (senile) of retina, unspecified   . Memory loss   . Other and unspecified hyperlipidemia   . Other malaise and fatigue 06/17/2010  . Pain in joint, site unspecified   . Pathologic fracture of vertebrae   . Reflux esophagitis   . Scoliosis (and kyphoscoliosis), idiopathic   . Senile osteoporosis   . Type II or unspecified type diabetes mellitus without mention of complication, uncontrolled   . Unspecified essential hypertension   . Unspecified hereditary and idiopathic peripheral neuropathy   . Unspecified vitamin D deficiency    Past Surgical History:  Procedure Laterality Date  . APPENDECTOMY    . BREAST LUMPECTOMY  02/05/2010   Dr. Christie Beckers  . CATARACT EXTRACTION W/ INTRAOCULAR LENS  IMPLANT, BILATERAL  04/2013   Dr. Bing Plume  . CHOLECYSTECTOMY  2004   Dr. Zella Richer  . COLONOSCOPY  2001   Dr. Collene Mares  . fibroidectomy  1950  . [place]  . KYPHOPLASTY  2010   T10 & T12 Dr. Erline Levine  . MEDIAL PARTIAL KNEE REPLACEMENT  Left 2004   Dr. Ronnie Derby  . SPINE SURGERY     vertebroplasty T10, T12  . SPINE SURGERY  11/02/2001    C4-5 & C5-6 Titanium Plate Placed Dr. Carloyn Manner  . THYROIDECTOMY, PARTIAL Left 2007  . TOTAL KNEE ARTHROPLASTY Right 2009   Dr. Alvan Dame     reports that she has never smoked. She has never used smokeless tobacco. She reports that she does not drink alcohol or use drugs. Social History    Socioeconomic History  . Marital status: Widowed    Spouse name: Not on file  . Number of children: 0  . Years of education: Not on file  . Highest education level: Not on file  Occupational History  . Occupation: retired Marketing executive: RETIRED  Social Needs  . Financial resource strain: Not hard at all  . Food insecurity:    Worry: Never true    Inability: Never true  . Transportation needs:    Medical: No    Non-medical: No  Tobacco Use  . Smoking status: Never Smoker  . Smokeless tobacco: Never Used  Substance and Sexual Activity  . Alcohol use: No  . Drug use: No  . Sexual activity: Never    Partners: Male    Birth control/protection: Post-menopausal  Lifestyle  . Physical activity:    Days per week: 7 days    Minutes per session: 20 min  . Stress: Only a little  Relationships  . Social connections:    Talks on phone: More than three times a week    Gets together: More than three times a week    Attends religious service: Never    Active member of club or organization: No    Attends meetings of clubs or organizations: Never    Relationship status: Widowed  . Intimate partner violence:    Fear of current or ex partner: No    Emotionally abused: No    Physically abused: No    Forced sexual activity: No  Other Topics Concern  . Not on file  Social History Narrative   Lives at [facility] since 02/06/2009   Widowed    No childred   Living Will    Functional Status Survey:    Family History  Problem Relation Age of Onset  . Diabetes Mother   . Heart disease Mother        CHF  . Stroke Father   . Heart disease Sister        MI  . Colon cancer Neg Hx   . Stomach cancer Neg Hx   . Esophageal cancer Neg Hx   . Rectal cancer Neg Hx   . Liver cancer Neg Hx     Health Maintenance  Topic Date Due  . PNA vac Low Risk Adult (2 of 2 - PCV13) 06/30/2002  . TETANUS/TDAP  07/01/2011  . FOOT EXAM  12/21/2015  .  OPHTHALMOLOGY EXAM  04/02/2016  . HEMOGLOBIN A1C  05/22/2018  . INFLUENZA VACCINE  Completed  . DEXA SCAN  Completed    Allergies  Allergen Reactions  . Bactrim     unknown  . Betadine [Povidone Iodine]     unknown  . Metformin And Related     Hurt stomach  . Nutmeg Oil (Myristica Oil)     unknown  . Sulfa Antibiotics     unknown    Outpatient Encounter Medications as of 05/07/2018  Medication Sig  . acetaminophen (TYLENOL)  325 MG tablet Take 650 mg by mouth every 8 (eight) hours as needed for mild pain or moderate pain.  . carboxymethylcellul-glycerin (OPTIVE) 0.5-0.9 % ophthalmic solution Place 2 drops into both eyes every 8 (eight) hours as needed for dry eyes.  Marland Kitchen losartan (COZAAR) 50 MG tablet TAKE 1 TABLET ONCE DAILY TO CONTROL BLOOD PRESSURE.  . metFORMIN (GLUCOPHAGE) 500 MG tablet Take 500 mg by mouth at bedtime.  . Multiple Vitamins-Minerals (ICAPS) TABS Take 1 tablet by mouth daily.   No facility-administered encounter medications on file as of 05/07/2018.     Review of Systems  Constitutional: Negative.   HENT: Negative.   Respiratory: Negative.   Cardiovascular: Negative.   Gastrointestinal: Negative.   Genitourinary: Negative.   Musculoskeletal: Positive for gait problem.  Psychiatric/Behavioral: Negative.     Vitals:   05/07/18 0952  BP: 140/68  Pulse: 80  Resp: 18  Temp: 98.2 F (36.8 C)  SpO2: 98%  Weight: 111 lb (50.3 kg)  Height: _0  (1.6 m)   Body mass index is 19.66 kg/m. Physical Exam  Constitutional: She is oriented to person, place, and time. She appears well-developed and well-nourished.  HENT:  Head: Normocephalic.  Mouth/Throat: Oropharynx is clear and moist.  Eyes: Pupils are equal, round, and reactive to light.  Cardiovascular: Normal rate and regular rhythm.  Pulmonary/Chest: Effort normal and breath sounds normal.  Abdominal: Soft. Bowel sounds are normal.  Musculoskeletal: Normal range of motion. She exhibits no edema.    She wears TED hose  Neurological: She is alert and oriented to person, place, and time.  Nursing note and vitals reviewed.   Labs reviewed: Basic Metabolic Panel: Recent Labs    07/10/17 11/19/17 12/29/17  NA 136* 140 138  K 4.3 4.3 4.1  CL  --  104 104  CO2  --  30 26  BUN _1 CREATININE 0.9 0.8 0.7  CALCIUM  --  11.3 10.7   Liver Function Tests: Recent Labs    07/10/17 11/19/17 12/29/17  AST _2 ALT _3 ALKPHOS 62 65 74  ALBUMIN  --  4.2 3.6   No results for input(s): LIPASE, AMYLASE in the last 8760 hours. No results for input(s): AMMONIA in the last 8760 hours. CBC: Recent Labs    07/10/17 12/29/17 04/01/18  WBC 10.6 12.1 7.8  HGB 12.8 12.1 11.8*  HCT 36 34* 35*  PLT 297 277 236   Cardiac Enzymes: No results for input(s): CKTOTAL, CKMB, CKMBINDEX, TROPONINI in the last 8760 hours. BNP: Invalid input(s): POCBNP Lab Results  Component Value Date   HGBA1C 7.5 11/19/2017   Lab Results  Component Value Date   TSH 0.45 08/13/2017   No results found for: VITAMINB12 No results found for: FOLATE No results found for: IRON, TIBC, FERRITIN  Imaging and Procedures obtained prior to SNF admission: Ct Abdomen Pelvis Wo Contrast  Result Date: 06/03/2016 CLINICAL DATA:  Mid abdominal pain for several weeks. EXAM: CT ABDOMEN AND PELVIS WITHOUT CONTRAST TECHNIQUE: Multidetector CT imaging of the abdomen and pelvis was performed following the standard protocol without IV contrast. COMPARISON:  Multiple exams, including 03/15/2003 CT scan FINDINGS: Lower chest: Coronary atherosclerotic calcification. Hepatobiliary: Cholecystectomy. Pancreas: Unremarkable Spleen: Unremarkable Adrenals/Urinary Tract: Hypodense 10 mm exophytic lesion of the left mid kidney posteromedially, there is previously a 6 mm cyst at this location back in 2004. No urinary tract calculi are identified. No hydronephrosis or hydroureter. Stomach/Bowel: Nondistended stomach. No malrotation.  Orally administered contrast extends through to the colon. Normal appendix. Extensive descending and sigmoid colon diverticulosis no definite active diverticulitis. Vascular/Lymphatic: Aortoiliac atherosclerotic vascular disease. Reproductive: 4.1 cm densely calcified subserosal fibroid posteriorly in the uterus. Other: No supplemental non-categorized findings. Musculoskeletal: Levoconvex scoliosis centered at the L1 level. Prior T12 compression fracture with vertebral augmentation. Grade 1 retrolisthesis at L1- 2 and L2-3 with grade 1 anterolisthesis at L5-S1. Multilevel degenerative facet arthropathy IMPRESSION: 1. The patient has considerable diverticulosis of the descending and sigmoid colon, but I do not observe definite diverticulitis at this time. 2. Considerable levoconvex lumbar scoliosis centered at the L1 level. 3. T12 compression fracture with about 6 mm of chronic posterior bony retropulsion and prior vertebral augmentation. 4. Atherosclerosis, including the coronary arteries. 5. 4.1 cm densely calcified posterior subserosal fibroid. Electronically Signed   By: Van Clines M.D.   On: 06/03/2016 17:42    Assessment/Plan 1. Essential hypertension Blood pressures have been elevated at 160/80 1 month ago We will divide losartan and to twice daily doses, leaving strength same.  Follow for improvement 2. DM type 2 with diabetic peripheral neuropathy (HCC) Last A1c was 7.5.  She takes metformin XR once a day.  I am not inclined to try to obtain tighter control  3. Diabetic mononeuropathy associated with diabetes mellitus due to underlying condition ([place]) Unsure of this diagnosis.  Foot exam today showed almost normal sensation.  She denies pain or numbness in her feet  4. Memory loss This is very mild and not inconsistent and a 84 year old   Family/ staff Communication: Findings communicated to patient and she understands  Labs/tests ordered: Cindee Salt. Sabra Heck, [place] [address] [place], [place] Office [phone]

## 2018-05-11 DIAGNOSIS — E1142 Type 2 diabetes mellitus with diabetic polyneuropathy: Secondary | ICD-10-CM | POA: Diagnosis not present

## 2018-05-11 DIAGNOSIS — E213 Hyperparathyroidism, unspecified: Secondary | ICD-10-CM | POA: Diagnosis not present

## 2018-05-11 DIAGNOSIS — R531 Weakness: Secondary | ICD-10-CM | POA: Diagnosis not present

## 2018-05-11 DIAGNOSIS — M545 Low back pain: Secondary | ICD-10-CM | POA: Diagnosis not present

## 2018-05-11 DIAGNOSIS — M25562 Pain in left knee: Secondary | ICD-10-CM | POA: Diagnosis not present

## 2018-05-11 DIAGNOSIS — R2681 Unsteadiness on feet: Secondary | ICD-10-CM | POA: Diagnosis not present

## 2018-05-11 DIAGNOSIS — M25561 Pain in right knee: Secondary | ICD-10-CM | POA: Diagnosis not present

## 2018-05-11 DIAGNOSIS — E118 Type 2 diabetes mellitus with unspecified complications: Secondary | ICD-10-CM | POA: Diagnosis not present

## 2018-05-11 DIAGNOSIS — M6281 Muscle weakness (generalized): Secondary | ICD-10-CM | POA: Diagnosis not present

## 2018-05-11 LAB — HEMOGLOBIN A1C: Hemoglobin A1C: 6.7

## 2018-05-12 ENCOUNTER — Other Ambulatory Visit: Payer: Self-pay | Admitting: *Deleted

## 2018-05-12 DIAGNOSIS — M25562 Pain in left knee: Secondary | ICD-10-CM | POA: Diagnosis not present

## 2018-05-12 DIAGNOSIS — R531 Weakness: Secondary | ICD-10-CM | POA: Diagnosis not present

## 2018-05-12 DIAGNOSIS — M545 Low back pain: Secondary | ICD-10-CM | POA: Diagnosis not present

## 2018-05-12 DIAGNOSIS — M6281 Muscle weakness (generalized): Secondary | ICD-10-CM | POA: Diagnosis not present

## 2018-05-12 DIAGNOSIS — M25561 Pain in right knee: Secondary | ICD-10-CM | POA: Diagnosis not present

## 2018-05-12 DIAGNOSIS — R2681 Unsteadiness on feet: Secondary | ICD-10-CM | POA: Diagnosis not present

## 2018-05-14 DIAGNOSIS — R531 Weakness: Secondary | ICD-10-CM | POA: Diagnosis not present

## 2018-05-14 DIAGNOSIS — M6281 Muscle weakness (generalized): Secondary | ICD-10-CM | POA: Diagnosis not present

## 2018-05-14 DIAGNOSIS — M25562 Pain in left knee: Secondary | ICD-10-CM | POA: Diagnosis not present

## 2018-05-14 DIAGNOSIS — M545 Low back pain: Secondary | ICD-10-CM | POA: Diagnosis not present

## 2018-05-14 DIAGNOSIS — M25561 Pain in right knee: Secondary | ICD-10-CM | POA: Diagnosis not present

## 2018-05-14 DIAGNOSIS — R2681 Unsteadiness on feet: Secondary | ICD-10-CM | POA: Diagnosis not present

## 2018-05-18 DIAGNOSIS — M25561 Pain in right knee: Secondary | ICD-10-CM | POA: Diagnosis not present

## 2018-05-18 DIAGNOSIS — R2681 Unsteadiness on feet: Secondary | ICD-10-CM | POA: Diagnosis not present

## 2018-05-18 DIAGNOSIS — M6281 Muscle weakness (generalized): Secondary | ICD-10-CM | POA: Diagnosis not present

## 2018-05-18 DIAGNOSIS — R531 Weakness: Secondary | ICD-10-CM | POA: Diagnosis not present

## 2018-05-18 DIAGNOSIS — M545 Low back pain: Secondary | ICD-10-CM | POA: Diagnosis not present

## 2018-05-18 DIAGNOSIS — M25562 Pain in left knee: Secondary | ICD-10-CM | POA: Diagnosis not present

## 2018-05-19 DIAGNOSIS — R2681 Unsteadiness on feet: Secondary | ICD-10-CM | POA: Diagnosis not present

## 2018-05-19 DIAGNOSIS — M6281 Muscle weakness (generalized): Secondary | ICD-10-CM | POA: Diagnosis not present

## 2018-05-19 DIAGNOSIS — R531 Weakness: Secondary | ICD-10-CM | POA: Diagnosis not present

## 2018-05-19 DIAGNOSIS — M545 Low back pain: Secondary | ICD-10-CM | POA: Diagnosis not present

## 2018-05-19 DIAGNOSIS — M25562 Pain in left knee: Secondary | ICD-10-CM | POA: Diagnosis not present

## 2018-05-19 DIAGNOSIS — M25561 Pain in right knee: Secondary | ICD-10-CM | POA: Diagnosis not present

## 2018-05-20 DIAGNOSIS — R2681 Unsteadiness on feet: Secondary | ICD-10-CM | POA: Diagnosis not present

## 2018-05-20 DIAGNOSIS — M25562 Pain in left knee: Secondary | ICD-10-CM | POA: Diagnosis not present

## 2018-05-20 DIAGNOSIS — R531 Weakness: Secondary | ICD-10-CM | POA: Diagnosis not present

## 2018-05-20 DIAGNOSIS — M6281 Muscle weakness (generalized): Secondary | ICD-10-CM | POA: Diagnosis not present

## 2018-05-20 DIAGNOSIS — M545 Low back pain: Secondary | ICD-10-CM | POA: Diagnosis not present

## 2018-05-20 DIAGNOSIS — M25561 Pain in right knee: Secondary | ICD-10-CM | POA: Diagnosis not present

## 2018-05-24 DIAGNOSIS — R2681 Unsteadiness on feet: Secondary | ICD-10-CM | POA: Diagnosis not present

## 2018-05-24 DIAGNOSIS — M25562 Pain in left knee: Secondary | ICD-10-CM | POA: Diagnosis not present

## 2018-05-24 DIAGNOSIS — M25561 Pain in right knee: Secondary | ICD-10-CM | POA: Diagnosis not present

## 2018-05-24 DIAGNOSIS — R531 Weakness: Secondary | ICD-10-CM | POA: Diagnosis not present

## 2018-05-24 DIAGNOSIS — M6281 Muscle weakness (generalized): Secondary | ICD-10-CM | POA: Diagnosis not present

## 2018-05-24 DIAGNOSIS — M545 Low back pain: Secondary | ICD-10-CM | POA: Diagnosis not present

## 2018-05-28 DIAGNOSIS — R2681 Unsteadiness on feet: Secondary | ICD-10-CM | POA: Diagnosis not present

## 2018-05-28 DIAGNOSIS — M25562 Pain in left knee: Secondary | ICD-10-CM | POA: Diagnosis not present

## 2018-05-28 DIAGNOSIS — M6281 Muscle weakness (generalized): Secondary | ICD-10-CM | POA: Diagnosis not present

## 2018-05-28 DIAGNOSIS — M545 Low back pain: Secondary | ICD-10-CM | POA: Diagnosis not present

## 2018-05-28 DIAGNOSIS — M25561 Pain in right knee: Secondary | ICD-10-CM | POA: Diagnosis not present

## 2018-05-28 DIAGNOSIS — R531 Weakness: Secondary | ICD-10-CM | POA: Diagnosis not present

## 2018-06-01 DIAGNOSIS — R413 Other amnesia: Secondary | ICD-10-CM | POA: Diagnosis not present

## 2018-06-01 DIAGNOSIS — M25562 Pain in left knee: Secondary | ICD-10-CM | POA: Diagnosis not present

## 2018-06-01 DIAGNOSIS — R29818 Other symptoms and signs involving the nervous system: Secondary | ICD-10-CM | POA: Diagnosis not present

## 2018-06-01 DIAGNOSIS — R2681 Unsteadiness on feet: Secondary | ICD-10-CM | POA: Diagnosis not present

## 2018-06-01 DIAGNOSIS — M6281 Muscle weakness (generalized): Secondary | ICD-10-CM | POA: Diagnosis not present

## 2018-06-01 DIAGNOSIS — R531 Weakness: Secondary | ICD-10-CM | POA: Diagnosis not present

## 2018-06-01 DIAGNOSIS — R32 Unspecified urinary incontinence: Secondary | ICD-10-CM | POA: Diagnosis not present

## 2018-06-01 DIAGNOSIS — R29898 Other symptoms and signs involving the musculoskeletal system: Secondary | ICD-10-CM | POA: Diagnosis not present

## 2018-06-01 DIAGNOSIS — M25561 Pain in right knee: Secondary | ICD-10-CM | POA: Diagnosis not present

## 2018-06-01 DIAGNOSIS — M859 Disorder of bone density and structure, unspecified: Secondary | ICD-10-CM | POA: Diagnosis not present

## 2018-06-01 DIAGNOSIS — R293 Abnormal posture: Secondary | ICD-10-CM | POA: Diagnosis not present

## 2018-06-01 DIAGNOSIS — E0590 Thyrotoxicosis, unspecified without thyrotoxic crisis or storm: Secondary | ICD-10-CM | POA: Diagnosis not present

## 2018-06-03 DIAGNOSIS — M859 Disorder of bone density and structure, unspecified: Secondary | ICD-10-CM | POA: Diagnosis not present

## 2018-06-03 DIAGNOSIS — M25562 Pain in left knee: Secondary | ICD-10-CM | POA: Diagnosis not present

## 2018-06-03 DIAGNOSIS — R2681 Unsteadiness on feet: Secondary | ICD-10-CM | POA: Diagnosis not present

## 2018-06-03 DIAGNOSIS — M25561 Pain in right knee: Secondary | ICD-10-CM | POA: Diagnosis not present

## 2018-06-03 DIAGNOSIS — M6281 Muscle weakness (generalized): Secondary | ICD-10-CM | POA: Diagnosis not present

## 2018-06-03 DIAGNOSIS — R531 Weakness: Secondary | ICD-10-CM | POA: Diagnosis not present

## 2018-06-08 DIAGNOSIS — R2681 Unsteadiness on feet: Secondary | ICD-10-CM | POA: Diagnosis not present

## 2018-06-08 DIAGNOSIS — M25561 Pain in right knee: Secondary | ICD-10-CM | POA: Diagnosis not present

## 2018-06-08 DIAGNOSIS — M859 Disorder of bone density and structure, unspecified: Secondary | ICD-10-CM | POA: Diagnosis not present

## 2018-06-08 DIAGNOSIS — R531 Weakness: Secondary | ICD-10-CM | POA: Diagnosis not present

## 2018-06-08 DIAGNOSIS — M6281 Muscle weakness (generalized): Secondary | ICD-10-CM | POA: Diagnosis not present

## 2018-06-08 DIAGNOSIS — M25562 Pain in left knee: Secondary | ICD-10-CM | POA: Diagnosis not present

## 2018-06-10 DIAGNOSIS — M25561 Pain in right knee: Secondary | ICD-10-CM | POA: Diagnosis not present

## 2018-06-10 DIAGNOSIS — M859 Disorder of bone density and structure, unspecified: Secondary | ICD-10-CM | POA: Diagnosis not present

## 2018-06-10 DIAGNOSIS — R2681 Unsteadiness on feet: Secondary | ICD-10-CM | POA: Diagnosis not present

## 2018-06-10 DIAGNOSIS — M25562 Pain in left knee: Secondary | ICD-10-CM | POA: Diagnosis not present

## 2018-06-10 DIAGNOSIS — M6281 Muscle weakness (generalized): Secondary | ICD-10-CM | POA: Diagnosis not present

## 2018-06-10 DIAGNOSIS — R531 Weakness: Secondary | ICD-10-CM | POA: Diagnosis not present

## 2018-06-14 DIAGNOSIS — M25562 Pain in left knee: Secondary | ICD-10-CM | POA: Diagnosis not present

## 2018-06-14 DIAGNOSIS — M859 Disorder of bone density and structure, unspecified: Secondary | ICD-10-CM | POA: Diagnosis not present

## 2018-06-14 DIAGNOSIS — M6281 Muscle weakness (generalized): Secondary | ICD-10-CM | POA: Diagnosis not present

## 2018-06-14 DIAGNOSIS — R2681 Unsteadiness on feet: Secondary | ICD-10-CM | POA: Diagnosis not present

## 2018-06-14 DIAGNOSIS — M25561 Pain in right knee: Secondary | ICD-10-CM | POA: Diagnosis not present

## 2018-06-14 DIAGNOSIS — R531 Weakness: Secondary | ICD-10-CM | POA: Diagnosis not present

## 2018-06-16 DIAGNOSIS — R531 Weakness: Secondary | ICD-10-CM | POA: Diagnosis not present

## 2018-06-16 DIAGNOSIS — M6281 Muscle weakness (generalized): Secondary | ICD-10-CM | POA: Diagnosis not present

## 2018-06-16 DIAGNOSIS — M25562 Pain in left knee: Secondary | ICD-10-CM | POA: Diagnosis not present

## 2018-06-16 DIAGNOSIS — R2681 Unsteadiness on feet: Secondary | ICD-10-CM | POA: Diagnosis not present

## 2018-06-16 DIAGNOSIS — M25561 Pain in right knee: Secondary | ICD-10-CM | POA: Diagnosis not present

## 2018-06-16 DIAGNOSIS — M859 Disorder of bone density and structure, unspecified: Secondary | ICD-10-CM | POA: Diagnosis not present

## 2018-06-26 ENCOUNTER — Encounter (HOSPITAL_COMMUNITY): Payer: Self-pay | Admitting: *Deleted

## 2018-06-26 ENCOUNTER — Inpatient Hospital Stay (HOSPITAL_COMMUNITY)
Admission: EM | Admit: 2018-06-26 | Discharge: 2018-06-28 | DRG: 311 | Disposition: A | Discharge status: Home / Self Care | Payer: Medicare Other | Source: Skilled Nursing Facility | Attending: Cardiovascular Disease | Admitting: Cardiovascular Disease

## 2018-06-26 ENCOUNTER — Other Ambulatory Visit: Payer: Self-pay

## 2018-06-26 DIAGNOSIS — R000 Tachycardia, unspecified: Secondary | ICD-10-CM | POA: Diagnosis not present

## 2018-06-26 DIAGNOSIS — Z882 Allergy status to sulfonamides status: Secondary | ICD-10-CM

## 2018-06-26 DIAGNOSIS — E440 Moderate protein-calorie malnutrition: Secondary | ICD-10-CM | POA: Diagnosis present

## 2018-06-26 DIAGNOSIS — I214 Non-ST elevation (NSTEMI) myocardial infarction: Secondary | ICD-10-CM | POA: Diagnosis not present

## 2018-06-26 DIAGNOSIS — M810 Age-related osteoporosis without current pathological fracture: Secondary | ICD-10-CM | POA: Diagnosis present

## 2018-06-26 DIAGNOSIS — H9190 Unspecified hearing loss, unspecified ear: Secondary | ICD-10-CM | POA: Diagnosis present

## 2018-06-26 DIAGNOSIS — Z96653 Presence of artificial knee joint, bilateral: Secondary | ICD-10-CM | POA: Diagnosis present

## 2018-06-26 DIAGNOSIS — K210 Gastro-esophageal reflux disease with esophagitis: Secondary | ICD-10-CM | POA: Diagnosis present

## 2018-06-26 DIAGNOSIS — Z961 Presence of intraocular lens: Secondary | ICD-10-CM | POA: Diagnosis present

## 2018-06-26 DIAGNOSIS — R079 Chest pain, unspecified: Secondary | ICD-10-CM | POA: Diagnosis not present

## 2018-06-26 DIAGNOSIS — M1990 Unspecified osteoarthritis, unspecified site: Secondary | ICD-10-CM | POA: Diagnosis present

## 2018-06-26 DIAGNOSIS — F0390 Unspecified dementia without behavioral disturbance: Secondary | ICD-10-CM | POA: Diagnosis present

## 2018-06-26 DIAGNOSIS — R011 Cardiac murmur, unspecified: Secondary | ICD-10-CM | POA: Diagnosis present

## 2018-06-26 DIAGNOSIS — E1142 Type 2 diabetes mellitus with diabetic polyneuropathy: Secondary | ICD-10-CM | POA: Diagnosis present

## 2018-06-26 DIAGNOSIS — Z9842 Cataract extraction status, left eye: Secondary | ICD-10-CM

## 2018-06-26 DIAGNOSIS — E7849 Other hyperlipidemia: Secondary | ICD-10-CM | POA: Diagnosis present

## 2018-06-26 DIAGNOSIS — I10 Essential (primary) hypertension: Secondary | ICD-10-CM | POA: Diagnosis not present

## 2018-06-26 DIAGNOSIS — Z823 Family history of stroke: Secondary | ICD-10-CM

## 2018-06-26 DIAGNOSIS — I249 Acute ischemic heart disease, unspecified: Secondary | ICD-10-CM | POA: Diagnosis present

## 2018-06-26 DIAGNOSIS — Z681 Body mass index (BMI) 19 or less, adult: Secondary | ICD-10-CM

## 2018-06-26 DIAGNOSIS — Z9841 Cataract extraction status, right eye: Secondary | ICD-10-CM

## 2018-06-26 DIAGNOSIS — Z9049 Acquired absence of other specified parts of digestive tract: Secondary | ICD-10-CM

## 2018-06-26 DIAGNOSIS — E1165 Type 2 diabetes mellitus with hyperglycemia: Secondary | ICD-10-CM | POA: Diagnosis present

## 2018-06-26 DIAGNOSIS — E213 Hyperparathyroidism, unspecified: Secondary | ICD-10-CM | POA: Diagnosis present

## 2018-06-26 DIAGNOSIS — Z79899 Other long term (current) drug therapy: Secondary | ICD-10-CM

## 2018-06-26 DIAGNOSIS — M4120 Other idiopathic scoliosis, site unspecified: Secondary | ICD-10-CM | POA: Diagnosis present

## 2018-06-26 DIAGNOSIS — Z833 Family history of diabetes mellitus: Secondary | ICD-10-CM

## 2018-06-26 DIAGNOSIS — Z888 Allergy status to other drugs, medicaments and biological substances status: Secondary | ICD-10-CM

## 2018-06-26 DIAGNOSIS — Z8249 Family history of ischemic heart disease and other diseases of the circulatory system: Secondary | ICD-10-CM

## 2018-06-26 DIAGNOSIS — R0789 Other chest pain: Secondary | ICD-10-CM | POA: Diagnosis not present

## 2018-06-26 DIAGNOSIS — Z7984 Long term (current) use of oral hypoglycemic drugs: Secondary | ICD-10-CM

## 2018-06-26 DIAGNOSIS — K589 Irritable bowel syndrome without diarrhea: Secondary | ICD-10-CM | POA: Diagnosis present

## 2018-06-26 DIAGNOSIS — E559 Vitamin D deficiency, unspecified: Secondary | ICD-10-CM | POA: Diagnosis present

## 2018-06-26 DIAGNOSIS — G8929 Other chronic pain: Secondary | ICD-10-CM | POA: Diagnosis present

## 2018-06-26 NOTE — ED Triage Notes (Signed)
[facility]  Lt uoper chest pain all day  She had  Sl nitro all day without relief.  Ems gave her aspirin 324mg  nitro sl x 2 with no relief??? However she told me no pain and gave another an swer when the pa asked

## 2018-06-26 NOTE — ED Provider Notes (Signed)
[facility] EMERGENCY DEPARTMENT Provider Note   CSN: [id number] Arrival date & time: 06/26/18  2314     History   Chief Complaint Chief Complaint  Patient presents with  . Chest Pain    HPI [name] is a 84 y.o. female.  [name] is a 84 y.o. female with a history of hypertension, diabetes, GERD, macular degeneration, who presents to the emergency department via EMS for evaluation of left-sided chest pain.  She reports chest pain for started last night.  She describes it as a dull constant pressure.  She reports she was given 1 tablet of nitroglycerin at her nursing facility, was able to go to sleep and chest pain seemed to improve, but this evening around 730 chest pain came back significantly worse than prior.  She reports she has not attempted to exert herself at all and is unsure if this would make pain better or worse.  She has a dull ache in her left arm but no pain in the neck or jaw.  No associated diaphoresis, nausea or vomiting.  Chest pain is a constant ache.  No shortness of breath, lightheadedness or syncope.  She was given 325 of aspirin and 2 sublingual nitroglycerin with EMS and reports she initially had no improvement in her pain but since arriving here in the emergency department she reports her pain has reduced to a constant dull ache and pressure over the left chest.  She denies any leg swelling or tenderness.  No prior history of coronary artery disease.  Is not currently followed by cardiologist.     Past Medical History:  Diagnosis Date  . Contact dermatitis and other eczema, due to unspecified cause   . Cortical senile cataract   . GERD (gastroesophageal reflux disease) 02/17/2013  . Hyperparathyroidism, unspecified ([place]) 03/26/2010  . Irritable bowel syndrome   . Keratoconjunctivitis sicca, not specified as Sjogren's   . Loss of weight   . Macular degeneration (senile) of retina, unspecified   . Memory loss   . Other and  unspecified hyperlipidemia   . Other malaise and fatigue 06/17/2010  . Pain in joint, site unspecified   . Pathologic fracture of vertebrae   . Reflux esophagitis   . Scoliosis (and kyphoscoliosis), idiopathic   . Senile osteoporosis   . Type II or unspecified type diabetes mellitus without mention of complication, uncontrolled   . Unspecified essential hypertension   . Unspecified hereditary and idiopathic peripheral neuropathy   . Unspecified vitamin D deficiency     Patient Active Problem List   Diagnosis Date Noted  . Right-sided chest pain 03/31/2018  . Right-sided chest wall pain 03/31/2018  . Chronic upper back pain 02/05/2018  . Moderate protein-calorie malnutrition ([place]) 11/05/2017  . Osteoarthritis of multiple joints 11/05/2017  . Unsteady gait 11/05/2017  . Right hip pain 07/24/2016  . Depression with anxiety 02/07/2016  . Pain in left knee 06/12/2014  . Scoliosis 08/04/2013  . GERD (gastroesophageal reflux disease) 02/17/2013  . Irritable bowel syndrome 02/17/2013  . Lumbago 02/17/2013  . Hyperparathyroidism ([place]) 02/17/2013  . Vitamin D deficiency 02/17/2013  . Hyperlipidemia   . Diabetic neuropathy ([place])   . Essential hypertension   . Memory loss   . DM type 2 with diabetic peripheral neuropathy ([place]) 08/27/2012    Past Surgical History:  Procedure Laterality Date  . APPENDECTOMY    . BREAST LUMPECTOMY  02/05/2010   Dr. Christie Beckers  . CATARACT EXTRACTION W/ INTRAOCULAR LENS  IMPLANT, BILATERAL  04/2013   Dr. Bing Plume  . CHOLECYSTECTOMY  2004   Dr. Zella Richer  . COLONOSCOPY  2001   Dr. Collene Mares  . fibroidectomy  1950  . [place]  . KYPHOPLASTY  2010   T10 & T12 Dr. Erline Levine  . MEDIAL PARTIAL KNEE REPLACEMENT Left 2004   Dr. Ronnie Derby  . SPINE SURGERY     vertebroplasty T10, T12  . SPINE SURGERY  11/02/2001    C4-5 & C5-6 Titanium Plate Placed Dr. Carloyn Manner  . THYROIDECTOMY, PARTIAL Left 2007  . TOTAL KNEE ARTHROPLASTY Right 2009   Dr. Alvan Dame       OB History    Gravida  0   Para  0   Term  0   Preterm  0   AB  0   Living  0     SAB  0   TAB  0   Ectopic  0   Multiple      Live Births               Home Medications    Prior to Admission medications   Medication Sig Start Date End Date Taking? Authorizing Provider  acetaminophen (TYLENOL) 325 MG tablet Take 650 mg by mouth every 8 (eight) hours as needed for mild pain or moderate pain.    [provider]  carboxymethylcellul-glycerin (OPTIVE) 0.5-0.9 % ophthalmic solution Place 2 drops into both eyes every 8 (eight) hours as needed for dry eyes.    [provider]  losartan (COZAAR) 50 MG tablet TAKE 1 TABLET ONCE DAILY TO CONTROL BLOOD PRESSURE. 03/11/17   Mast, Man X, NP  metFORMIN (GLUCOPHAGE) 500 MG tablet Take 500 mg by mouth at bedtime.    [provider]  Multiple Vitamins-Minerals (ICAPS) TABS Take 1 tablet by mouth daily.    [provider]    Family History Family History  Problem Relation Age of Onset  . Diabetes Mother   . Heart disease Mother        CHF  . Stroke Father   . Heart disease Sister        MI  . Colon cancer Neg Hx   . Stomach cancer Neg Hx   . Esophageal cancer Neg Hx   . Rectal cancer Neg Hx   . Liver cancer Neg Hx     Social History Social History   Tobacco Use  . Smoking status: Never Smoker  . Smokeless tobacco: Never Used  Substance Use Topics  . Alcohol use: No  . Drug use: No     Allergies   Bactrim; Betadine [povidone iodine]; Metformin and related; Nutmeg oil (myristica oil); and Sulfa antibiotics   Review of Systems Review of Systems  Constitutional: Negative for chills and fever.  HENT: Negative.   Eyes: Negative for visual disturbance.  Respiratory: Negative for cough and shortness of breath.   Cardiovascular: Positive for chest pain. Negative for palpitations and leg swelling.  Gastrointestinal: Negative for abdominal pain, nausea and vomiting.   Genitourinary: Negative for dysuria and frequency.  Musculoskeletal: Negative for arthralgias and myalgias.  Skin: Negative for color change and rash.  Neurological: Negative for dizziness, syncope, facial asymmetry, speech difficulty, weakness, light-headedness, numbness and headaches.     Physical Exam Updated Vital Signs BP (!) 180/81   Pulse (!) 102   Temp 97.7 F (36.5 C)   Resp 17   Ht 5\' 5"  (1.651 m)   Wt 54 kg  SpO2 93%   BMI 19.80 kg/m   Physical Exam Vitals signs and nursing note reviewed.  Constitutional:      General: She is not in acute distress.    Appearance: She is well-developed and normal weight. She is not ill-appearing or diaphoretic.  HENT:     Head: Normocephalic and atraumatic.  Eyes:     General:        Right eye: No discharge.        Left eye: No discharge.  Cardiovascular:     Rate and Rhythm: Normal rate and regular rhythm.     Pulses:          Radial pulses are 2+ on the right side and 2+ on the left side.       Dorsalis pedis pulses are 2+ on the right side and 2+ on the left side.     Heart sounds: Normal heart sounds. No murmur. No friction rub. No gallop.   Pulmonary:     Effort: Pulmonary effort is normal. No respiratory distress.     Comments: Respirations equal and unlabored, patient able to speak in full sentences, lungs clear to auscultation bilaterally Chest:     Chest wall: No tenderness.  Abdominal:     General: Bowel sounds are normal.     Palpations: Abdomen is soft. There is no mass.     Tenderness: There is no abdominal tenderness. There is no guarding.     Comments: Abdomen soft, nondistended, nontender to palpation in all quadrants without guarding or peritoneal signs  Musculoskeletal:     Right lower leg: She exhibits no tenderness. No edema.     Left lower leg: She exhibits no tenderness. No edema.  Skin:    General: Skin is warm and dry.  Neurological:     General: No focal deficit present.     Mental Status:  She is alert and oriented to person, place, and time.     Coordination: Coordination normal.     Comments: Speech is clear, able to follow commands CN III-XII intact Normal strength in upper and lower extremities bilaterally including dorsiflexion and plantar flexion, strong and equal grip strength Sensation normal to light and sharp touch Moves extremities without ataxia, coordination intact Normal finger to nose and rapid alternating movements No pronator drift  Psychiatric:        Behavior: Behavior normal.      ED Treatments / Results  Labs (all labs ordered are listed, but only abnormal results are displayed) Labs Reviewed  CBC - Abnormal; Notable for the following components:      Result Value   RBC 3.65 (*)    Hemoglobin 11.3 (*)    HCT 34.5 (*)    All other components within normal limits  COMPREHENSIVE METABOLIC PANEL - Abnormal; Notable for the following components:   Glucose, Bld 265 (*)    Total Protein 5.7 (*)    Albumin 3.4 (*)    All other components within normal limits  I-STAT TROPONIN, ED - Abnormal; Notable for the following components:   Troponin i, poc 0.19 (*)    All other components within normal limits  LIPASE, BLOOD  HEPARIN LEVEL (UNFRACTIONATED)  TROPONIN I  TROPONIN I  TROPONIN I  BASIC METABOLIC PANEL  LIPID PANEL    EKG EKG Interpretation  Date/Time:  Saturday June 26 2018 23:17:29 EST Ventricular Rate:  109 PR Interval:    QRS Duration: 82 QT Interval:  333 QTC Calculation: 449 R Axis:   -  2 Text Interpretation:  Sinus tachycardia Baseline wander in lead(s) V3 Since last EKG, heart rate increased Otherwise no significant change Confirmed by Duffy Bruce [id number]) on 06/27/2018 12:08:16 AM   Radiology Dg Chest 2 View  Result Date: 06/27/2018 CLINICAL DATA:  Chest pain EXAM: CHEST - 2 VIEW COMPARISON:  None. FINDINGS: The heart size and mediastinal contours are within normal limits. Both lungs are clear. The visualized  skeletal structures are unremarkable. Prior vertebral augmentation. IMPRESSION: No active cardiopulmonary disease. Electronically Signed   By: Ulyses Jarred M.D.   On: 06/27/2018 00:16    Procedures .Critical Care Performed by: Jacqlyn Larsen, PA-C Authorized by: Jacqlyn Larsen, PA-C   Critical care provider statement:    Critical care time (minutes):  45   Critical care was necessary to treat or prevent imminent or life-threatening deterioration of the following conditions:  Cardiac failure (ACS)   Critical care was time spent personally by me on the following activities:  Discussions with consultants, evaluation of patient's response to treatment, examination of patient, ordering and performing treatments and interventions, ordering and review of laboratory studies, ordering and review of radiographic studies, pulse oximetry, re-evaluation of patient's condition, obtaining history from patient or surrogate and review of old charts   (including critical care time)  Medications Ordered in ED Medications  heparin ADULT infusion 100 units/mL (25000 units/26mL sodium chloride 0.45%) (600 Units/hr Intravenous New Bag/Given 06/27/18 0304)  nitroGLYCERIN 50 mg in dextrose 5 % 250 mL (0.2 mg/mL) infusion (5 mcg/min Intravenous New Bag/Given 06/27/18 0305)  heparin bolus via infusion 2,000 Units (2,000 Units Intravenous Bolus from Bag 06/27/18 0304)     Initial Impression / Assessment and Plan / ED Course  I have reviewed the triage vital signs and the nursing notes.  Pertinent labs & imaging results that were available during my care of the patient were reviewed by me and considered in my medical decision making (see chart for details).  84 year old female presents for left-sided chest pain and pressure, had a similar episode last night which was relieved with nitroglycerin, pain returned and worsened this evening, was given aspirin and nitro with EMS without relief.  No associated shortness of  breath or syncope.  No prior history of ACS, does have history of hypertension and diabetes.  Chest pain story certainly concerning for ACS, although initial EKG on arrival without acute ischemic changes.  Patient does have mild tachycardia heart rate of 103 and is hypertensive but all other vitals normal.  I have lower suspicion for PE, symptoms do not seem consistent with aortic dissection.  Patient is not having any fevers or cough to suggest pneumonia.  Will get troponin, basic labs and chest x-ray.  12:12 AM notified by nursing staff of positive troponin of 0.19.  Patient continues to complain of some dull left-sided chest pain will start patient on heparin drip and nitroglycerin drip.  Labs show no leukocytosis hemoglobin of 11.3, glucose is elevated at 265 but no other acute electrolyte derangements, no anion gap to suggest DKA.  Normal lipase, normal renal and liver function.  Chest x-ray shows no active cardiopulmonary disease.  Case discussed with Dr. Doylene Canard with cardiology who will see and admit the patient for NSTEMI and further cardiac work-up.  Patient discussed with Dr. Ellender Hose, who saw patient as well and agrees with plan.   Final Clinical Impressions(s) / ED Diagnoses   Final diagnoses:  NSTEMI (non-ST elevated myocardial infarction) [facility])    ED Discharge Orders  None       Jacqlyn Larsen, [place] 06/27/18 [id number]    Duffy Bruce, MD 06/27/18 [phone]

## 2018-06-27 ENCOUNTER — Other Ambulatory Visit: Payer: Self-pay

## 2018-06-27 ENCOUNTER — Encounter (HOSPITAL_COMMUNITY): Payer: Self-pay | Admitting: *Deleted

## 2018-06-27 ENCOUNTER — Emergency Department (HOSPITAL_COMMUNITY): Payer: Medicare Other

## 2018-06-27 DIAGNOSIS — Z961 Presence of intraocular lens: Secondary | ICD-10-CM | POA: Diagnosis present

## 2018-06-27 DIAGNOSIS — Z833 Family history of diabetes mellitus: Secondary | ICD-10-CM | POA: Diagnosis not present

## 2018-06-27 DIAGNOSIS — Z8249 Family history of ischemic heart disease and other diseases of the circulatory system: Secondary | ICD-10-CM | POA: Diagnosis not present

## 2018-06-27 DIAGNOSIS — Z96653 Presence of artificial knee joint, bilateral: Secondary | ICD-10-CM | POA: Diagnosis present

## 2018-06-27 DIAGNOSIS — R531 Weakness: Secondary | ICD-10-CM | POA: Diagnosis not present

## 2018-06-27 DIAGNOSIS — E559 Vitamin D deficiency, unspecified: Secondary | ICD-10-CM | POA: Diagnosis present

## 2018-06-27 DIAGNOSIS — Z681 Body mass index (BMI) 19 or less, adult: Secondary | ICD-10-CM | POA: Diagnosis not present

## 2018-06-27 DIAGNOSIS — E440 Moderate protein-calorie malnutrition: Secondary | ICD-10-CM | POA: Diagnosis present

## 2018-06-27 DIAGNOSIS — Z882 Allergy status to sulfonamides status: Secondary | ICD-10-CM | POA: Diagnosis not present

## 2018-06-27 DIAGNOSIS — Z9841 Cataract extraction status, right eye: Secondary | ICD-10-CM | POA: Diagnosis not present

## 2018-06-27 DIAGNOSIS — I25110 Atherosclerotic heart disease of native coronary artery with unstable angina pectoris: Secondary | ICD-10-CM | POA: Diagnosis not present

## 2018-06-27 DIAGNOSIS — F0390 Unspecified dementia without behavioral disturbance: Secondary | ICD-10-CM | POA: Diagnosis present

## 2018-06-27 DIAGNOSIS — Z9842 Cataract extraction status, left eye: Secondary | ICD-10-CM | POA: Diagnosis not present

## 2018-06-27 DIAGNOSIS — Z823 Family history of stroke: Secondary | ICD-10-CM | POA: Diagnosis not present

## 2018-06-27 DIAGNOSIS — Z888 Allergy status to other drugs, medicaments and biological substances status: Secondary | ICD-10-CM | POA: Diagnosis not present

## 2018-06-27 DIAGNOSIS — M2550 Pain in unspecified joint: Secondary | ICD-10-CM | POA: Diagnosis not present

## 2018-06-27 DIAGNOSIS — E213 Hyperparathyroidism, unspecified: Secondary | ICD-10-CM | POA: Diagnosis present

## 2018-06-27 DIAGNOSIS — I361 Nonrheumatic tricuspid (valve) insufficiency: Secondary | ICD-10-CM | POA: Diagnosis not present

## 2018-06-27 DIAGNOSIS — Z9049 Acquired absence of other specified parts of digestive tract: Secondary | ICD-10-CM | POA: Diagnosis not present

## 2018-06-27 DIAGNOSIS — M1990 Unspecified osteoarthritis, unspecified site: Secondary | ICD-10-CM | POA: Diagnosis present

## 2018-06-27 DIAGNOSIS — M810 Age-related osteoporosis without current pathological fracture: Secondary | ICD-10-CM | POA: Diagnosis present

## 2018-06-27 DIAGNOSIS — I10 Essential (primary) hypertension: Secondary | ICD-10-CM | POA: Diagnosis not present

## 2018-06-27 DIAGNOSIS — E7849 Other hyperlipidemia: Secondary | ICD-10-CM | POA: Diagnosis present

## 2018-06-27 DIAGNOSIS — R079 Chest pain, unspecified: Secondary | ICD-10-CM | POA: Diagnosis not present

## 2018-06-27 DIAGNOSIS — I350 Nonrheumatic aortic (valve) stenosis: Secondary | ICD-10-CM | POA: Diagnosis not present

## 2018-06-27 DIAGNOSIS — I249 Acute ischemic heart disease, unspecified: Secondary | ICD-10-CM | POA: Diagnosis present

## 2018-06-27 DIAGNOSIS — M4120 Other idiopathic scoliosis, site unspecified: Secondary | ICD-10-CM | POA: Diagnosis present

## 2018-06-27 DIAGNOSIS — E1165 Type 2 diabetes mellitus with hyperglycemia: Secondary | ICD-10-CM | POA: Diagnosis present

## 2018-06-27 DIAGNOSIS — Z7401 Bed confinement status: Secondary | ICD-10-CM | POA: Diagnosis not present

## 2018-06-27 DIAGNOSIS — R011 Cardiac murmur, unspecified: Secondary | ICD-10-CM | POA: Diagnosis present

## 2018-06-27 DIAGNOSIS — E119 Type 2 diabetes mellitus without complications: Secondary | ICD-10-CM | POA: Diagnosis not present

## 2018-06-27 DIAGNOSIS — K210 Gastro-esophageal reflux disease with esophagitis: Secondary | ICD-10-CM | POA: Diagnosis present

## 2018-06-27 LAB — CBC
HCT: 34.5 % — ABNORMAL LOW (ref 36.0–46.0)
Hemoglobin: 11.3 g/dL — ABNORMAL LOW (ref 12.0–15.0)
MCH: 31 pg (ref 26.0–34.0)
MCHC: 32.8 g/dL (ref 30.0–36.0)
MCV: 94.5 fL (ref 80.0–100.0)
Platelets: 234 10*3/uL (ref 150–400)
RBC: 3.65 MIL/uL — ABNORMAL LOW (ref 3.87–5.11)
RDW: 13.1 % (ref 11.5–15.5)
WBC: 10.3 10*3/uL (ref 4.0–10.5)
nRBC: 0 % (ref 0.0–0.2)

## 2018-06-27 LAB — COMPREHENSIVE METABOLIC PANEL
ALT: 16 U/L (ref 0–44)
AST: 23 U/L (ref 15–41)
Albumin: 3.4 g/dL — ABNORMAL LOW (ref 3.5–5.0)
Alkaline Phosphatase: 54 U/L (ref 38–126)
Anion gap: 9 (ref 5–15)
BUN: 16 mg/dL (ref 8–23)
CO2: 24 mmol/L (ref 22–32)
Calcium: 10.2 mg/dL (ref 8.9–10.3)
Chloride: 102 mmol/L (ref 98–111)
Creatinine, Ser: 0.78 mg/dL (ref 0.44–1.00)
GFR calc Af Amer: >60 mL/min (ref >60)
GFR calc non Af Amer: >60 mL/min (ref >60)
Glucose, Bld: 265 mg/dL — ABNORMAL HIGH (ref 70–99)
Potassium: 4 mmol/L (ref 3.5–5.1)
Sodium: 135 mmol/L (ref 135–145)
Total Bilirubin: 0.6 mg/dL (ref 0.3–1.2)
Total Protein: 5.7 g/dL — ABNORMAL LOW (ref 6.5–8.1)

## 2018-06-27 LAB — BASIC METABOLIC PANEL
Anion gap: 9 (ref 5–15)
BUN: 12 mg/dL (ref 8–23)
CO2: 27 mmol/L (ref 22–32)
Calcium: 10.5 mg/dL — ABNORMAL HIGH (ref 8.9–10.3)
Chloride: 102 mmol/L (ref 98–111)
Creatinine, Ser: 0.76 mg/dL (ref 0.44–1.00)
GFR calc Af Amer: >60 mL/min (ref >60)
GFR calc non Af Amer: >60 mL/min (ref >60)
Glucose, Bld: 243 mg/dL — ABNORMAL HIGH (ref 70–99)
Potassium: 4 mmol/L (ref 3.5–5.1)
Sodium: 138 mmol/L (ref 135–145)

## 2018-06-27 LAB — HEPARIN LEVEL (UNFRACTIONATED)
Heparin Unfractionated: 0.35 IU/mL (ref 0.30–0.70)
Heparin Unfractionated: 0.21 IU/mL — ABNORMAL LOW (ref 0.30–0.70)

## 2018-06-27 LAB — LIPID PANEL
Cholesterol: 169 mg/dL (ref 0–200)
HDL: 70 mg/dL (ref >40)
LDL Cholesterol: 96 mg/dL (ref 0–99)
Total CHOL/HDL Ratio: 2.4 RATIO
Triglycerides: 17 mg/dL (ref <150)
VLDL: 3 mg/dL (ref 0–40)

## 2018-06-27 LAB — TROPONIN I
Troponin I: 0.6 ng/mL (ref <0.03)
Troponin I: 0.85 ng/mL (ref <0.03)
Troponin I: 0.71 ng/mL (ref <0.03)

## 2018-06-27 LAB — I-STAT TROPONIN, ED: Troponin i, poc: 0.19 ng/mL (ref 0.00–0.08)

## 2018-06-27 LAB — LIPASE, BLOOD: Lipase: 39 U/L (ref 11–51)

## 2018-06-27 LAB — GLUCOSE, CAPILLARY
Glucose-Capillary: 247 mg/dL — ABNORMAL HIGH (ref 70–99)
Glucose-Capillary: 233 mg/dL — ABNORMAL HIGH (ref 70–99)

## 2018-06-27 MED ORDER — HEPARIN BOLUS VIA INFUSION
2000.0000 [IU] | Freq: Once | INTRAVENOUS | Status: AC
  Administered 2018-06-27: 2000 [IU] via INTRAVENOUS
  Filled 2018-06-27: qty 2000

## 2018-06-27 MED ORDER — INSULIN ASPART 100 UNIT/ML ~~LOC~~ SOLN: 3.0000 [IU] | Freq: Three times a day (TID) | SUBCUTANEOUS | Status: DC

## 2018-06-27 MED ORDER — METOPROLOL TARTRATE 12.5 MG HALF TABLET
12.5000 mg | ORAL_TABLET | Freq: Two times a day (BID) | ORAL | Status: DC
  Administered 2018-06-27 – 2018-06-28 (×3): 12.5 mg via ORAL
  Filled 2018-06-27 (×3): qty 1

## 2018-06-27 MED ORDER — NITROGLYCERIN 0.4 MG SL SUBL: 0.4000 mg | SUBLINGUAL_TABLET | SUBLINGUAL | Status: DC | PRN

## 2018-06-27 MED ORDER — NITROGLYCERIN IN D5W 200-5 MCG/ML-% IV SOLN
0.0000 ug/min | INTRAVENOUS | Status: DC
  Administered 2018-06-27: 5 ug/min via INTRAVENOUS
  Filled 2018-06-27: qty 250

## 2018-06-27 MED ORDER — HEPARIN (PORCINE) 25000 UT/250ML-% IV SOLN
800.0000 [IU]/h | INTRAVENOUS | Status: DC
  Filled 2018-06-27: qty 250

## 2018-06-27 MED ORDER — POLYVINYL ALCOHOL 1.4 % OP SOLN: 2.0000 [drp] | Freq: Three times a day (TID) | OPHTHALMIC | Status: DC | PRN

## 2018-06-27 MED ORDER — INSULIN ASPART 100 UNIT/ML ~~LOC~~ SOLN: 0.0000 [IU] | Freq: Three times a day (TID) | SUBCUTANEOUS | Status: DC

## 2018-06-27 MED ORDER — HEPARIN (PORCINE) 25000 UT/250ML-% IV SOLN
600.0000 [IU]/h | INTRAVENOUS | Status: DC
  Administered 2018-06-27: 600 [IU]/h via INTRAVENOUS
  Filled 2018-06-27: qty 250

## 2018-06-27 MED ORDER — ACETAMINOPHEN 325 MG PO TABS: 650.0000 mg | ORAL_TABLET | ORAL | Status: DC | PRN

## 2018-06-27 MED ORDER — ASPIRIN EC 81 MG PO TBEC
81.0000 mg | DELAYED_RELEASE_TABLET | Freq: Every day | ORAL | Status: DC
  Administered 2018-06-27 – 2018-06-28 (×2): 81 mg via ORAL
  Filled 2018-06-27 (×2): qty 1

## 2018-06-27 MED ORDER — ONDANSETRON HCL 4 MG/2ML IJ SOLN: 4.0000 mg | Freq: Four times a day (QID) | INTRAMUSCULAR | Status: DC | PRN

## 2018-06-27 MED ORDER — LOSARTAN POTASSIUM 25 MG PO TABS
25.0000 mg | ORAL_TABLET | Freq: Two times a day (BID) | ORAL | Status: DC
  Administered 2018-06-27 – 2018-06-28 (×4): 25 mg via ORAL
  Filled 2018-06-27 (×4): qty 1

## 2018-06-27 MED ORDER — ATORVASTATIN CALCIUM 40 MG PO TABS
40.0000 mg | ORAL_TABLET | Freq: Every day | ORAL | Status: DC
  Administered 2018-06-27: 40 mg via ORAL
  Filled 2018-06-27: qty 1

## 2018-06-27 MED ORDER — HYDRALAZINE HCL 25 MG PO TABS
25.0000 mg | ORAL_TABLET | Freq: Three times a day (TID) | ORAL | Status: DC
  Administered 2018-06-27 – 2018-06-28 (×4): 25 mg via ORAL
  Filled 2018-06-27 (×5): qty 1

## 2018-06-27 MED ORDER — METFORMIN HCL ER 500 MG PO TB24
500.0000 mg | ORAL_TABLET | Freq: Every day | ORAL | Status: DC
  Administered 2018-06-27: 500 mg via ORAL
  Filled 2018-06-27: qty 1

## 2018-06-27 MED ORDER — HEPARIN BOLUS VIA INFUSION
1000.0000 [IU] | Freq: Once | INTRAVENOUS | Status: AC
  Administered 2018-06-27: 1000 [IU] via INTRAVENOUS
  Filled 2018-06-27: qty 1000

## 2018-06-27 MED ORDER — AMLODIPINE BESYLATE 5 MG PO TABS
5.0000 mg | ORAL_TABLET | Freq: Every day | ORAL | Status: DC
  Administered 2018-06-27 – 2018-06-28 (×2): 5 mg via ORAL
  Filled 2018-06-27 (×2): qty 1

## 2018-06-27 NOTE — Progress Notes (Signed)
Patient transferred from ED.  Caregiver at bedside, Edmon Crape, ph [phone], states legal guardian is niece Meryl Dare who lives in [place], [place]. [phone].  Meryl Dare notified of admission.

## 2018-06-27 NOTE — Progress Notes (Signed)
ANTICOAGULATION CONSULT NOTE - Initial Consult  Pharmacy Consult for heparin Indication: chest pain/ACS  Allergies  Allergen Reactions  . Bactrim     unknown  . Betadine [Povidone Iodine]     unknown  . Metformin And Related     Hurt stomach  . Nutmeg Oil (Myristica Oil)     unknown  . Sulfa Antibiotics     unknown    Patient Measurements: Height: 5\' 5"  (165.1 cm) Weight: 119 lb (54 kg) IBW/kg (Calculated) : 57  Vital Signs: Temp: 97.7 F (36.5 C) (12/28 2325) BP: 180/67 (12/28 2345) Pulse Rate: 98 (12/28 2345)  Labs: Recent Labs    06/26/18 2343  CREATININE 0.78    Estimated Creatinine Clearance: 39.8 mL/min (by C-G formula based on SCr of 0.78 mg/dL).   Medical History: Past Medical History:  Diagnosis Date  . Contact dermatitis and other eczema, due to unspecified cause   . Cortical senile cataract   . GERD (gastroesophageal reflux disease) 02/17/2013  . Hyperparathyroidism, unspecified ([place]) 03/26/2010  . Irritable bowel syndrome   . Keratoconjunctivitis sicca, not specified as Sjogren's   . Loss of weight   . Macular degeneration (senile) of retina, unspecified   . Memory loss   . Other and unspecified hyperlipidemia   . Other malaise and fatigue 06/17/2010  . Pain in joint, site unspecified   . Pathologic fracture of vertebrae   . Reflux esophagitis   . Scoliosis (and kyphoscoliosis), idiopathic   . Senile osteoporosis   . Type II or unspecified type diabetes mellitus without mention of complication, uncontrolled   . Unspecified essential hypertension   . Unspecified hereditary and idiopathic peripheral neuropathy   . Unspecified vitamin D deficiency     Assessment: 84yo female c/o CP all day, no relief w/ SL NTG, troponin elevated, to begin heparin.  Goal of Therapy:  Heparin level 0.3-0.7 units/ml Monitor platelets by anticoagulation protocol: Yes   Plan:  Will give heparin 2000 units IV bolus followed by gtt at 600 units/hr and monitor  heparin levels and CBC.  Wynona Neat, PharmD, BCPS  06/27/2018,12:27 AM

## 2018-06-27 NOTE — Progress Notes (Signed)
Dr. Doylene Canard notified of BP 163/68 on 62mcg/min NTG IV and CBG of 247.  He will review meds.

## 2018-06-27 NOTE — ED Notes (Signed)
Up to the br 

## 2018-06-27 NOTE — H&P (Signed)
Referring Physician: Man Mast, NP/Kelsey Marijean Bravo, PA-C  NATSHA GUIDRY is an 84 y.o. female.                       Chief Complaint: Chest pain  HPI: 84 year old female had left upper chest pain all day. She had one SL NTG with no relief at Nursing home and had SL NTG x 2 + 324 mg. Aspirin by [facility] with no relief. Now she is without chest pain. Her Troponin I is minimally elevated. EKG is without ischemic changes and chest x-ray is unremarkable. She has PMH of hypertension, Arthritis, macular degeneration, type 2 DM and moderate protein calorie malnutrition.  Past Medical History:  Diagnosis Date  . Contact dermatitis and other eczema, due to unspecified cause   . Cortical senile cataract   . GERD (gastroesophageal reflux disease) 02/17/2013  . Hyperparathyroidism, unspecified ([place]) 03/26/2010  . Irritable bowel syndrome   . Keratoconjunctivitis sicca, not specified as Sjogren's   . Loss of weight   . Macular degeneration (senile) of retina, unspecified   . Memory loss   . Other and unspecified hyperlipidemia   . Other malaise and fatigue 06/17/2010  . Pain in joint, site unspecified   . Pathologic fracture of vertebrae   . Reflux esophagitis   . Scoliosis (and kyphoscoliosis), idiopathic   . Senile osteoporosis   . Type II or unspecified type diabetes mellitus without mention of complication, uncontrolled   . Unspecified essential hypertension   . Unspecified hereditary and idiopathic peripheral neuropathy   . Unspecified vitamin D deficiency       Past Surgical History:  Procedure Laterality Date  . APPENDECTOMY    . BREAST LUMPECTOMY  02/05/2010   Dr. Christie Beckers  . CATARACT EXTRACTION W/ INTRAOCULAR LENS  IMPLANT, BILATERAL  04/2013   Dr. Bing Plume  . CHOLECYSTECTOMY  2004   Dr. Zella Richer  . COLONOSCOPY  2001   Dr. Collene Mares  . fibroidectomy  1950  . [place]  . KYPHOPLASTY  2010   T10 & T12 Dr. Erline Levine  . MEDIAL PARTIAL KNEE REPLACEMENT Left 2004   Dr. Ronnie Derby   . SPINE SURGERY     vertebroplasty T10, T12  . SPINE SURGERY  11/02/2001    C4-5 & C5-6 Titanium Plate Placed Dr. Carloyn Manner  . THYROIDECTOMY, PARTIAL Left 2007  . TOTAL KNEE ARTHROPLASTY Right 2009   Dr. Alvan Dame     Family History  Problem Relation Age of Onset  . Diabetes Mother   . Heart disease Mother        CHF  . Stroke Father   . Heart disease Sister        MI  . Colon cancer Neg Hx   . Stomach cancer Neg Hx   . Esophageal cancer Neg Hx   . Rectal cancer Neg Hx   . Liver cancer Neg Hx    Social History:  reports that she has never smoked. She has never used smokeless tobacco. She reports that she does not drink alcohol or use drugs.  Allergies:  Allergies  Allergen Reactions  . Bactrim     unknown  . Betadine [Povidone Iodine]     unknown  . Metformin And Related     Hurt stomach  . Nutmeg Oil (Myristica Oil)     unknown  . Sulfa Antibiotics     unknown    (Not in a hospital admission)   Results for orders placed  or performed during the hospital encounter of 06/26/18 (from the past 48 hour(s))  CBC     Status: Abnormal   Collection Time: 06/26/18 11:43 PM  Result Value Ref Range   WBC 10.3 4.0 - 10.5 K/uL   RBC 3.65 (L) 3.87 - 5.11 MIL/uL   Hemoglobin 11.3 (L) 12.0 - 15.0 g/dL   HCT 34.5 (L) 36.0 - 46.0 %   MCV 94.5 80.0 - 100.0 fL   MCH 31.0 26.0 - 34.0 pg   MCHC 32.8 30.0 - 36.0 g/dL   RDW 13.1 11.5 - 15.5 %   Platelets 234 150 - 400 K/uL   nRBC 0.0 0.0 - 0.2 %    Comment: Performed at [facility], [place] [address], [place], [place] [address]  Lipase, blood     Status: None   Collection Time: 06/26/18 11:43 PM  Result Value Ref Range   Lipase 39 11 - 51 U/L    Comment: Performed at [facility] [address], [place], [place] [address]  Comprehensive metabolic panel     Status: Abnormal   Collection Time: 06/26/18 11:43 PM  Result Value Ref Range   Sodium 135 135 - 145 mmol/L   Potassium 4.0 3.5 - 5.1 mmol/L   Chloride 102 98 - 111  mmol/L   CO2 24 22 - 32 mmol/L   Glucose, Bld 265 (H) 70 - 99 mg/dL   BUN 16 8 - 23 mg/dL   Creatinine, Ser 0.78 0.44 - 1.00 mg/dL   Calcium 10.2 8.9 - 10.3 mg/dL   Total Protein 5.7 (L) 6.5 - 8.1 g/dL   Albumin 3.4 (L) 3.5 - 5.0 g/dL   AST 23 15 - 41 U/L   ALT 16 0 - 44 U/L   Alkaline Phosphatase 54 38 - 126 U/L   Total Bilirubin 0.6 0.3 - 1.2 mg/dL   GFR calc non Af Amer >60 >60 mL/min   GFR calc Af Amer >60 >60 mL/min   Anion gap 9 5 - 15    Comment: Performed at [facility] [address] [place], [place] [address]  I-stat troponin, ED     Status: Abnormal   Collection Time: 06/26/18 11:56 PM  Result Value Ref Range   Troponin i, poc 0.19 (HH) 0.00 - 0.08 ng/mL   Comment NOTIFIED PHYSICIAN    Comment 3            Comment: Due to the release kinetics of cTnI, a negative result within the first hours of the onset of symptoms does not rule out myocardial infarction with certainty. If myocardial infarction is still suspected, repeat the test at appropriate intervals.    Dg Chest 2 View  Result Date: 06/27/2018 CLINICAL DATA:  Chest pain EXAM: CHEST - 2 VIEW COMPARISON:  None. FINDINGS: The heart size and mediastinal contours are within normal limits. Both lungs are clear. The visualized skeletal structures are unremarkable. Prior vertebral augmentation. IMPRESSION: No active cardiopulmonary disease. Electronically Signed   By: Ulyses Jarred M.D.   On: 06/27/2018 00:16    Review Of Systems Constitutional: No fever, chills, positive weight loss. Eyes: Positive vision change, wears glasses. No discharge or pain. Ears: Mild hearing loss, No tinnitus. Respiratory: No asthma, COPD, pneumonias. Positive shortness of breath. No hemoptysis. Cardiovascular: Positive chest pain, palpitation, leg edema. Gastrointestinal: No nausea, vomiting, diarrhea, constipation. No GI bleed. No hepatitis. Genitourinary: No dysuria, hematuria, kidney stone. No incontinance. Neurological: No  headache, stroke, seizures.  Psychiatry: No psych facility admission for anxiety, depression, suicide. No detox. Skin: No rash. Musculoskeletal: Positive joint pain, fibromyalgia, neck pain, back pain. Lymphadenopathy: No lymphadenopathy. Hematology: No anemia or easy bruising.   Blood pressure (!) 180/67, pulse 98, temperature 97.7 F (36.5 C), resp. rate (!) 21, height 5\' 5"  (1.651 m), weight 54 kg, SpO2 100 %. Body mass index is 19.8 kg/m. General appearance: alert, cooperative, appears stated age and no distress Head: Normocephalic, atraumatic. Eyes: Medium Brown eyes, pink conjunctiva, corneas clear. PERRL, EOM's intact. Neck: No adenopathy, no carotid bruit, no JVD, supple, symmetrical, trachea midline and thyroid not enlarged. Resp: Clear to auscultation bilaterally. Cardio: Regular rate and rhythm, S1, S2 normal, II/VI systolic murmur, no click, rub or gallop GI: Soft, non-tender; bowel sounds normal; no organomegaly. Extremities: Trace edema, no cyanosis or clubbing. Skin: Warm and dry.  Neurologic: Alert and oriented X 2, normal strength. Normal coordination and slow gait.  Assessment/Plan Acute coronary syndrome Type 2 DM Hypertension  Admit. IV heparin, r/o MI. Home medications. Encourage medical treatment,  Birdie Riddle, MD  06/27/2018, 2:47 AM

## 2018-06-27 NOTE — ED Notes (Signed)
To x-ray

## 2018-06-27 NOTE — ED Notes (Signed)
Dr Doylene Canard here to see

## 2018-06-27 NOTE — Progress Notes (Signed)
ANTICOAGULATION CONSULT NOTE - Follow Up Consult  Pharmacy Consult for IV Heparin Indication: chest pain/ACS  Allergies  Allergen Reactions  . Bactrim     unknown  . Betadine [Povidone Iodine]     unknown  . Metformin And Related     Hurt stomach  . Nutmeg Oil (Myristica Oil)     unknown  . Sulfa Antibiotics     unknown    Patient Measurements: Height: 5\' 5"  (165.1 cm) Weight: 111 lb 4.8 oz (50.5 kg) IBW/kg (Calculated) : 57 Heparin Dosing Weight: 50.5 kg  Vital Signs: Temp: 98.6 F (37 C) (12/29 1558) Temp Source: Oral (12/29 1558) BP: 159/64 (12/29 1735) Pulse Rate: 85 (12/29 1558)  Labs: Recent Labs    06/26/18 2343 06/27/18 0343 06/27/18 1015 06/27/18 1646  HGB 11.3*  --   --   --   HCT 34.5*  --   --   --   PLT 234  --   --   --   HEPARINUNFRC  --   --  0.35 0.21*  CREATININE 0.78  --  0.76  --   TROPONINI  --  0.60* 0.85*  --     Estimated Creatinine Clearance: 37.3 mL/min (by C-G formula based on SCr of 0.76 mg/dL).   Medications:  Infusions:  . heparin Stopped (06/27/18 1401)  . nitroGLYCERIN 25 mcg/min (06/27/18 1712)    Assessment: 84 year old female on IV Heparin per pharmacy dosing consult for ACS.    Second heparin level is dropped to 0.21, subtherapeutic on same rate of heparin 600 units/hr.  No interruptions/issues with heparin infusion and no  bleeding reported per RN.   Goal of Therapy:  Heparin level 0.3-0.7 units/ml Monitor platelets by anticoagulation protocol: Yes   Plan:  Give heparin 1000 units IV bolus x1 Increase  Heparin to 700 units/hr Repeat heparin level in 8 hours to confirm Daily heparin level and CBC  Nicole Cella, RPh Clinical Pharmacist Please check AMION for all [place] phone numbers After 10:00 PM, call [place] [phone] 06/27/2018,5:58 PM

## 2018-06-27 NOTE — Progress Notes (Signed)
ANTICOAGULATION CONSULT NOTE - Follow Up Consult  Pharmacy Consult for IV Heparin Indication: chest pain/ACS  Allergies  Allergen Reactions  . Bactrim     unknown  . Betadine [Povidone Iodine]     unknown  . Metformin And Related     Hurt stomach  . Nutmeg Oil (Myristica Oil)     unknown  . Sulfa Antibiotics     unknown    Patient Measurements: Height: 5\' 5"  (165.1 cm) Weight: 119 lb (54 kg) IBW/kg (Calculated) : 57 Heparin Dosing Weight: 54 kg  Vital Signs: Temp: 97.7 F (36.5 C) (12/28 2325) BP: 175/77 (12/29 1000) Pulse Rate: 83 (12/29 1000)  Labs: Recent Labs    06/26/18 2343 06/27/18 0343 06/27/18 1015  HGB 11.3*  --   --   HCT 34.5*  --   --   PLT 234  --   --   HEPARINUNFRC  --   --  0.35  CREATININE 0.78  --  0.76  TROPONINI  --  0.60* 0.85*    Estimated Creatinine Clearance: 39.8 mL/min (by C-G formula based on SCr of 0.76 mg/dL).   Medications:  Infusions:  . heparin 600 Units/hr (06/27/18 0304)  . nitroGLYCERIN 10 mcg/min (06/27/18 6283)    Assessment: 83 year old female on IV Heparin per pharmacy dosing consult for ACS.   Initial heparin level is therapeutic at 0.35 after initial bolus and rate of 600 units/hr.  SCr is stable. CBC is within normal limits.  No bleeding reported.   Goal of Therapy:  Heparin level 0.3-0.7 units/ml Monitor platelets by anticoagulation protocol: Yes   Plan:  Continue Heparin 600 units/hr Repeat heparin level in 8 hours to confirm Daily heparin level and CBC  Francesca Jewett, Charmian Muff 06/27/2018,11:23 AM

## 2018-06-27 NOTE — ED Notes (Signed)
Dr Ellender Hose informed of troponin results .[place]

## 2018-06-28 ENCOUNTER — Inpatient Hospital Stay (HOSPITAL_COMMUNITY): Payer: Medicare Other

## 2018-06-28 LAB — CBC
HCT: 32.4 % — ABNORMAL LOW (ref 36.0–46.0)
Hemoglobin: 10.7 g/dL — ABNORMAL LOW (ref 12.0–15.0)
MCH: 30.7 pg (ref 26.0–34.0)
MCHC: 33 g/dL (ref 30.0–36.0)
MCV: 93.1 fL (ref 80.0–100.0)
Platelets: 236 10*3/uL (ref 150–400)
RBC: 3.48 MIL/uL — ABNORMAL LOW (ref 3.87–5.11)
RDW: 13.2 % (ref 11.5–15.5)
WBC: 9.7 10*3/uL (ref 4.0–10.5)
nRBC: 0 % (ref 0.0–0.2)

## 2018-06-28 LAB — ECHOCARDIOGRAM COMPLETE
Height: 65 in
Weight: 1763.68 oz

## 2018-06-28 LAB — HEPARIN LEVEL (UNFRACTIONATED)
Heparin Unfractionated: 0.26 IU/mL — ABNORMAL LOW (ref 0.30–0.70)
Heparin Unfractionated: 0.59 IU/mL (ref 0.30–0.70)

## 2018-06-28 LAB — GLUCOSE, CAPILLARY
Glucose-Capillary: 146 mg/dL — ABNORMAL HIGH (ref 70–99)
Glucose-Capillary: 142 mg/dL — ABNORMAL HIGH (ref 70–99)

## 2018-06-28 MED ORDER — ASPIRIN 81 MG PO TBEC: 81.0000 mg | DELAYED_RELEASE_TABLET | Freq: Every day | ORAL | Status: AC

## 2018-06-28 MED ORDER — HYDRALAZINE HCL 25 MG PO TABS: 25.0000 mg | ORAL_TABLET | Freq: Three times a day (TID) | ORAL | 3 refills | Status: DC

## 2018-06-28 MED ORDER — ATORVASTATIN CALCIUM 40 MG PO TABS: 40.0000 mg | ORAL_TABLET | Freq: Every day | ORAL | 3 refills | Status: DC

## 2018-06-28 MED ORDER — SODIUM CHLORIDE 0.9 % IV SOLN: INTRAVENOUS | Status: DC

## 2018-06-28 MED ORDER — LINAGLIPTIN 5 MG PO TABS
5.0000 mg | ORAL_TABLET | Freq: Every day | ORAL | Status: DC
  Administered 2018-06-28: 5 mg via ORAL
  Filled 2018-06-28: qty 1

## 2018-06-28 MED ORDER — METOPROLOL TARTRATE 25 MG PO TABS: 12.5000 mg | ORAL_TABLET | Freq: Two times a day (BID) | ORAL | 3 refills | Status: DC

## 2018-06-28 MED ORDER — AMLODIPINE BESYLATE 5 MG PO TABS: 5.0000 mg | ORAL_TABLET | Freq: Every day | ORAL | 3 refills | Status: DC

## 2018-06-28 MED ORDER — LINAGLIPTIN 5 MG PO TABS: 5.0000 mg | ORAL_TABLET | Freq: Every day | ORAL | 3 refills | Status: DC

## 2018-06-28 NOTE — Discharge Instructions (Signed)
Acute Coronary Syndrome    Acute coronary syndrome (ACS) is a serious problem in which there is suddenly not enough blood and oxygen reaching the heart. ACS can result in chest pain or a heart attack.  This condition is a medical emergency. If you have any symptoms of this condition, get help right away.  What are the causes?  This condition may be caused by:   Buildup of fat and cholesterol inside of the arteries (atherosclerosis). This is the most common cause. The buildup (plaque) can cause blood vessels in the heart (coronary arteries) to become narrow or blocked, which reduces blood flow to the heart. Plaque can also break off and lead to a clot, which can block an artery and cause a heart attack or stroke.   Sudden tightening of the muscles around the coronary arteries (coronary spasm).   Tearing of a coronary artery (spontaneous coronary artery dissection).   Very low blood pressure (hypotension).   An abnormal heartbeat (arrhythmia).   Other medical conditions that cause a decrease of oxygen to the heart, such as anemiaorrespiratory failure.   Using cocaine or methamphetamine.  What increases the risk?  The following factors may make you more likely to develop this condition:   Age. The risk for ACS increases as you get older.   History of chest pain, heart attack, peripheral artery disease, or stroke.   Having taken chemotherapy or immune-suppressing medicines.   Being female.   Family history of chest pain, heart disease, or stroke.   Smoking.   Not exercising enough.   Being overweight.   High cholesterol.   High blood pressure (hypertension).   Diabetes.   Excessive alcohol use.  What are the signs or symptoms?  Common symptoms of this condition include:   Chest pain. The pain may last a long time, or it may stop and come back (recur). It may feel like:  ? Crushing or squeezing.  ? Tightness, pressure, fullness, or heaviness.   Arm, neck, jaw, or back pain.   Heartburn or  indigestion.   Shortness of breath.   Nausea.   Sudden cold sweats.   Light-headedness.   Dizziness, or passing out.   Tiredness (fatigue).  Sometimes there are no symptoms.  How is this diagnosed?  This condition may be diagnosed based on:   Your medical history and symptoms.   An electrocardiogram (ECG). This imaging test measures the heart's electrical activity.   Blood tests. Cardiac blood tests may need to be repeated at designated time intervals.   Chest X-ray.   A CT scan of the chest.   A coronary angiogram. This is a procedure in which dye is injected into the bloodstream and then X-rays are taken to show if there is a blockage in a coronary artery.   Exercise stress testing.   Echocardiography. This is a test that uses sound waves to produce detailed images of the heart.  How is this treated?  The treatment is to restore blood flow to the heart as soon as possible. Treatment for this condition may include:   Oxygen therapy.   Medicines, such as:  ? Antiplatelet medicines and blood-thinning medicines, such as aspirin. These help prevent blood clots.  ? Medicine that dissolves any blood clots (fibrinolytic therapy).  ? Blood pressure medicines.  ? Nitroglycerin. This helps relieve chest pain and widens blood vessels to improve blood flow.  ? Pain medicine.  ? Cholesterol-lowering medicine.   Surgery, such as:  ? Coronary angioplasty with   stent placement. This involves placing a small piece of metal that looks like mesh or a spring into a narrow coronary artery. This widens the artery and keep it open.  ? Coronary artery bypass surgery. This involves taking a section of a blood vessel from a different part of your body, and placing it on the blocked coronary artery to allow blood to flow around (bypass) the blockage.   Cardiac rehabilitation. This is a program that helps improve your health and well-being. It includes exercise training, education, and counseling to help you recover.  Follow  these instructions at home:  Eating and drinking   Eat a heart-healthy diet that includes whole grains, fruits and vegetables, lean proteins, and low-fat or nonfat dairy products.   Limit how much salt (sodium) you eat as told by your health care provider. Follow instructions from your health care provider about any other eating or drinking restrictions, such as limiting foods that are high in fat and processed sugars.   Use healthy cooking methods such as roasting, grilling, broiling, baking, poaching, steaming, or stir-frying.   Talk with a dietitian to learn about healthy cooking methods and how to eat less sodium.  Medicines   Take over-the-counter and prescription medicines only as told by your health care provider.   Teka Chanda not take these medicines unless your health care provider approves:  ? Vitamin supplements that contain vitamin A or vitamin E.  ? Nonsteroidal anti-inflammatory drugs (NSAIDs), such as ibuprofen, naproxen, or celecoxib.  ? Hormone replacement therapy that contains estrogen.  If you are taking blood thinners:   Talk with your health care provider before you take any medicines that contain aspirin or NSAIDs. These medicines increase your risk for dangerous bleeding.   Take your medicine exactly as told, at the same time every day.   Avoid activities that could cause injury or bruising, and follow instructions about how to prevent falls.   Wear a medical alert bracelet, and carry a card that lists what medicines you take.  Activity   Join a cardiac rehabilitation program. An exercise plan will be developed for you.   Ask your health care provider:  ? What activities and exercises are safe for you.  ? If you should follow specific instructions about lifting, driving, or climbing stairs.  Lifestyle   Maycol Hoying not use any products that contain nicotine or tobacco, such as cigarettes and e-cigarettes. If you need help quitting, ask your health care provider.   If your health care provider  says that alcohol is safe for you, limit your alcohol intake to no more than 1 drink a day. One drink equals 12 oz of beer, 5 oz of wine, or 1 oz of hard liquor.   Maintain a healthy weight. If you need to lose weight, work with your health care provider to Katye Valek so safely.  General instructions   Tell all the health care providers who care for you about your heart condition, including your dentist. This may affect the medicines or treatment you receive.   Manage any other health conditions you have, such as hypertension or diabetes. These conditions affect your heart.   Learn ways to manage stress.   Get screened for depression, and get mental health treatment if you need it. People with ACS are at higher risk for depression.   Keep your vaccinations up to date. Get the flu shot (influenza vaccine) every year.   If directed, monitor your blood pressure at home.   Keep all   follow-up visits as told by your health care provider. This is important.  Contact a health care provider if:   You feel overwhelmed or sad.   You have trouble doing your daily activities.  Get help right away if:   You have pain in your chest, neck, arm, jaw, stomach, or back that recurs, and:  ? It lasts for more than a few minutes.  ? It is not relieved by taking the medicineyour health care provider prescribed.   You have unexplained:  ? Heavy sweating.  ? Heartburn or indigestion.  ? Nausea or vomiting.  ? Shortness of breath.  ? Difficulty breathing.  ? Fatigue.  ? Nervousness or anxiety.  ? Weakness.  ? Diarrhea.  ? Dark stools or blood in your stool.   You have sudden light-headedness or dizziness.   Your blood pressure is higher than 180/120.   You faint.   You have thoughts about hurting yourself.  These symptoms may represent a serious problem that is an emergency. Sheron Robin not wait to see if the symptoms will go away. Get medical help right away. Call your local emergency services (911 in the [place] Nazia Rhines not drive yourself to the  hospital.  If you ever feel like you may hurt yourself or others, or have thoughts about taking your own life, get help right away. You can go to your nearest emergency department or call:   Emergency services (911 in the [place]   A suicide crisis helpline, such as the National Suicide Prevention Lifeline at 1-800-273-8255. This is open 24 hours a day.  Summary   Acute coronary syndrome (ACS) is when there is not enough blood and oxygen being supplied to the heart. ACS can result in chest pain or a heart attack.   Acute coronary syndrome is a medical emergency. If you have any symptoms of this condition, get help right away.   Treatment includes medicines and procedures to open the blocked arteries and restore blood flow.  This information is not intended to replace advice given to you by your health care provider. Make sure you discuss any questions you have with your health care provider.  Document Released: 06/16/2005 Document Revised: 02/24/2017 Document Reviewed: 02/24/2017  Elsevier Interactive Patient Education  2019 Elsevier Inc.

## 2018-06-28 NOTE — Progress Notes (Signed)
Patient was moved to a low bed and floor mats were implemented. Patient is very confused at night and wants to get out of bed. She tried greater than 10x to get up and was confused on time and place. She is on a heparin gtt and is a high fall risk. Bed alarm set and patient reoriented to use call light when wanting to get up. **Please consider safety sitter for nighttime when her caregiver is not available**   Charlsie Quest

## 2018-06-28 NOTE — Progress Notes (Signed)
Patient able to complete bed mobility, transfers with RW, and gait in room with RW with min guard. Feel she will be able to return to prior living facility without difficulty especially as aide and sitter are available at this facility.   Formal documentation to follow.  Deniece Ree PT, DPT, CBIS  Supplemental Physical Therapist [facility]    Pager [phone] Acute Rehab Office [phone]

## 2018-06-28 NOTE — Progress Notes (Signed)
  Echocardiogram 2D Echocardiogram has been performed.  Johny Chess 06/28/2018, 12:11 PM

## 2018-06-28 NOTE — Care Management Note (Signed)
Case Management Note  Patient Details  Name: [name] MRN: [id number] Date of Birth: 1925-12-18  Subjective/Objective:  Pt admitted on 06/27/18 with acute coronary syndrome.  PTA, pt resided at [facility].                  Action/Plan: PT recommending HH follow up at ALF; will notify facility of need for follow up therapy, as facility provides in-house rehab.    Expected Discharge Date:  06/28/18               Expected Discharge Plan:  Assisted Living / Rest Home  In-House Referral:  Clinical Social Work  Discharge planning Services  CM Consult  Post Acute Care Choice:    Choice offered to:     DME Arranged:    DME Agency:     HH Arranged:    HH Agency:     Status of Service:  Completed, signed off  If discussed at [employer], dates discussed:    Additional Comments:  Reinaldo Raddle, RN, BSN  Trauma/Neuro ICU Case Manager [phone]

## 2018-06-28 NOTE — Progress Notes (Signed)
ANTICOAGULATION CONSULT NOTE - Follow Up Consult  Pharmacy Consult for IV Heparin Indication: chest pain/ACS  Allergies  Allergen Reactions  . Bactrim     unknown  . Betadine [Povidone Iodine]     unknown  . Metformin And Related     Hurt stomach  . Nutmeg Oil (Myristica Oil)     unknown  . Sulfa Antibiotics     unknown    Patient Measurements: Height: 5\' 5"  (165.1 cm) Weight: 111 lb 4.8 oz (50.5 kg) IBW/kg (Calculated) : 57 Heparin Dosing Weight: 50.5 kg  Vital Signs: Temp: 98.5 F (36.9 C) (12/29 1959) Temp Source: Oral (12/29 1959) BP: 129/58 (12/29 1959) Pulse Rate: 73 (12/29 1959)  Labs: Recent Labs    06/26/18 2343 06/27/18 0343 06/27/18 1015 06/27/18 1646 06/28/18 0200  HGB 11.3*  --   --   --  10.7*  HCT 34.5*  --   --   --  32.4*  PLT 234  --   --   --  236  HEPARINUNFRC  --   --  0.35 0.21* 0.26*  CREATININE 0.78  --  0.76  --   --   TROPONINI  --  0.60* 0.85* 0.71*  --     Estimated Creatinine Clearance: 37.3 mL/min (by C-G formula based on SCr of 0.76 mg/dL).   Medications:  Infusions:  . heparin 700 Units/hr (06/27/18 1832)  . nitroGLYCERIN 25 mcg/min (06/27/18 1712)    Assessment: 84 year old female on IV Heparin per pharmacy dosing consult for ACS.    Heparin level 0.26 units/ml Goal of Therapy:  Heparin level 0.3-0.7 units/ml Monitor platelets by anticoagulation protocol: Yes   Plan:  Increase  Heparin to 800 units/hr Repeat heparin level in 6-8 hours to confirm Daily heparin level and CBC  Thanks for allowing pharmacy to be a part of this patient's care.  Excell Seltzer, PharmD Clinical Pharmacist

## 2018-06-28 NOTE — NC FL2 (Signed)
[place] MEDICAID FL2 LEVEL OF CARE SCREENING TOOL     IDENTIFICATION  Patient Name: [name] Birthdate: June 04, 1926 Sex: female Admission Date (Current Location): 06/26/2018  [facility] and [place] Number:  Herbalist and Address:  The . [facility], [place] [address], [place], [place] [address]      Provider Number: [id number]  Attending Physician Name and Address:  Dixie Dials, MD  Relative Name and Phone Number:       Current Level of Care: Hospital Recommended Level of Care: [place] Prior Approval Number:    Date Approved/Denied:   PASRR Number:    Discharge Plan: Domiciliary (Rest home)(ALF)    Current Diagnoses: Patient Active Problem List   Diagnosis Date Noted  . Acute coronary syndrome ([place]) 06/27/2018  . Right-sided chest pain 03/31/2018  . Right-sided chest wall pain 03/31/2018  . Chronic upper back pain 02/05/2018  . Moderate protein-calorie malnutrition ([place]) 11/05/2017  . Osteoarthritis of multiple joints 11/05/2017  . Unsteady gait 11/05/2017  . Right hip pain 07/24/2016  . Depression with anxiety 02/07/2016  . Pain in left knee 06/12/2014  . Scoliosis 08/04/2013  . GERD (gastroesophageal reflux disease) 02/17/2013  . Irritable bowel syndrome 02/17/2013  . Lumbago 02/17/2013  . Hyperparathyroidism ([place]) 02/17/2013  . Vitamin D deficiency 02/17/2013  . Hyperlipidemia   . Diabetic neuropathy ([place])   . Essential hypertension   . Memory loss   . DM type 2 with diabetic peripheral neuropathy ([place]) 08/27/2012    Orientation RESPIRATION BLADDER Height & Weight     Self, Time, Situation, Place  Normal Continent Weight: 50 kg Height:  5\' 5"  (165.1 cm)  BEHAVIORAL SYMPTOMS/MOOD NEUROLOGICAL BOWEL NUTRITION STATUS      Continent Diet(regular)  AMBULATORY STATUS COMMUNICATION OF NEEDS Skin   Supervision Verbally Normal                       Personal Care Assistance Level of Assistance   Bathing, Feeding, Dressing Bathing Assistance: Limited assistance Feeding assistance: Independent Dressing Assistance: Limited assistance     Functional Limitations Info  Sight, Hearing, Speech Sight Info: Adequate Hearing Info: Adequate Speech Info: Adequate    SPECIAL CARE FACTORS FREQUENCY  PT (By licensed PT)     PT Frequency: 3x/week              Contractures Contractures Info: Not present    Additional Factors Info  Code Status, Allergies, Insulin Sliding Scale Code Status Info: Partial Allergies Info: Bactrim, Betadine Povidone Iodine, Metformin And Related, Nutmeg Oil (Myristica Oil), Sulfa Antibiotics   Insulin Sliding Scale Info: novolog 3x/day with meals and at bedtime       Current Medications (06/28/2018):  This is the current hospital active medication list Current Facility-Administered Medications  Medication Dose Route Frequency Provider Last Rate Last Dose  . 0.9 %  sodium chloride infusion   Intravenous Continuous Dixie Dials, MD   Stopped at 06/28/18 1020  . acetaminophen (TYLENOL) tablet 650 mg  650 mg Oral Q4H PRN Dixie Dials, MD      . amLODipine (NORVASC) tablet 5 mg  5 mg Oral Daily Dixie Dials, MD   5 mg at 06/28/18 0850  . aspirin EC tablet 81 mg  81 mg Oral Daily Dixie Dials, MD   81 mg at 06/28/18 0850  . atorvastatin (LIPITOR) tablet 40 mg  40 mg Oral q1800 Dixie Dials, MD   40 mg at 06/27/18 1735  .  hydrALAZINE (APRESOLINE) tablet 25 mg  25 mg Oral Q8H Dixie Dials, MD   25 mg at 06/28/18 0501  . insulin aspart (novoLOG) injection 0-9 Units  0-9 Units Subcutaneous TID WC Dixie Dials, MD      . insulin aspart (novoLOG) injection 3 Units  3 Units Subcutaneous TID WC Dixie Dials, MD      . linagliptin (TRADJENTA) tablet 5 mg  5 mg Oral Daily Dixie Dials, MD      . losartan (COZAAR) tablet 25 mg  25 mg Oral BID Dixie Dials, MD   25 mg at 06/28/18 0850  . metFORMIN (GLUCOPHAGE-XR) 24 hr tablet 500 mg  500 mg Oral QHS Dixie Dials, MD   500 mg at 06/27/18 2148  . metoprolol tartrate (LOPRESSOR) tablet 12.5 mg  12.5 mg Oral BID Dixie Dials, MD   12.5 mg at 06/28/18 0850  . nitroGLYCERIN (NITROSTAT) SL tablet 0.4 mg  0.4 mg Sublingual Q5 Min x 3 PRN Dixie Dials, MD      . ondansetron (ZOFRAN) injection 4 mg  4 mg Intravenous Q6H PRN Dixie Dials, MD      . polyvinyl alcohol (LIQUIFILM TEARS) 1.4 % ophthalmic solution 2 drop  2 drop Left Eye TID PRN Dixie Dials, MD         Discharge Medications: Please see discharge summary for a list of discharge medications.  Relevant Imaging Results:  Relevant Lab Results:   Additional Information SSN: [id number]  Estanislado Emms, LCSW

## 2018-06-28 NOTE — Progress Notes (Signed)
OT Cancellation Note  Patient Details Name: [name] MRN: [id number] DOB: January 15, 1926   Cancelled Treatment:    Reason Eval/Treat Not Completed: Patient declined, no reason specified;PT screened, no needs identified, will sign off Pt will return to ALF to have 24/7 supervision and assist with ADL/IADL needs.  Ebony Hail Harold Hedge) Marsa Aris OTR/L Acute Rehabilitation Services Pager: [phone] Office: [phone]   Fredda Hammed 06/28/2018, 4:16 PM

## 2018-06-28 NOTE — Discharge Summary (Signed)
Physician Discharge Summary  Patient ID: [name] MRN: [id number] DOB/AGE: August 14, 1925 84 y.o.  Admit date: 06/26/2018 Discharge date: 06/28/2018  Admission Diagnoses: Acute coronary syndrome Type 2 DM Hypertension  Discharge Diagnoses:  Principle problem: Acute coronary syndrome Active Problems:   Type 2 DM, poorly controlled   Hypertension   Weakness   Early dementia   Arthritis   Macular degeneration of both eyes   Moderate protein calorie malnutrition  Discharged Condition: fair  Hospital Course: 84 year old female [name] had left upper chest pain all day prior to transfer to ER. Her Troponin I was minimally elevated. She had unremarkable EKG and chest x-ray. She was treated with IV heparin, B-blocker and Atorvastatin. Tradjenta 5 mg. daily was added for elevated blood sugars.  Her echocardiogram was negative for wall motion abnormality and had EF 65-70 %.  She and family want medical treatment only.  Home health RN, PT and aid were recommended at discharge.  She was discharged home in stable condition with F/U by primary care in 1 week and by me in 1 month.  Consults: cardiology  Significant Diagnostic Studies: labs: Normal WBC and platelets count. Mildly low Hgb of 11.8 g. BMET was normal except blood sugar was 265 mg. Improving with sliding scale insulin use.   EKG was sinus tachycardia otherwise unremarkable.  Chest x-ray was unremarkable.  Echocardiogram showed normal LV systolic function with thick and calcific AV and MV  Treatments: cardiac meds: aspirin, metoprolol, amlodipine and Atorvastatin  Discharge Exam: Blood pressure 125/69, pulse 83, temperature 98.7 F (37.1 C), temperature source Oral, resp. rate 18, height 5\' 5"  (1.651 m), weight 50 kg, SpO2 98 %. General appearance: alert, cooperative and appears stated age. Howard: Normocephalic, atraumatic. Eyes: Medium Brown eyes, pink conjunctiva, corneas clear. PERRL, EOM's intact.  Neck: No  adenopathy, no carotid bruit, no JVD, supple, symmetrical, trachea midline and thyroid not enlarged. Resp: Clear to auscultation bilaterally. Cardio: Regular rate and rhythm, S1, S2 normal, II/VI systolic murmur, no click, rub or gallop. GI: Soft, non-tender; bowel sounds normal; no organomegaly. Extremities: No edema, cyanosis or clubbing. Skin: Warm and dry.  Neurologic: Alert and oriented X 3, normal strength and tone. Normal coordination and slow gait.  Disposition: Discharge disposition: 01-Home or Self Care        Allergies as of 06/28/2018      Reactions   Bactrim    unknown   Betadine [povidone Iodine]    unknown   Metformin And Related    Hurt stomach   Nutmeg Oil (myristica Oil)    unknown   Sulfa Antibiotics    unknown      Medication List    TAKE these medications   amLODipine 5 MG tablet Commonly known as:  NORVASC Take 1 tablet (5 mg total) by mouth daily. Start taking on:  June 29, 2018   aspirin 81 MG EC tablet Take 1 tablet (81 mg total) by mouth daily. Start taking on:  June 29, 2018   atorvastatin 40 MG tablet Commonly known as:  LIPITOR Take 1 tablet (40 mg total) by mouth daily at 6 PM.   hydrALAZINE 25 MG tablet Commonly known as:  APRESOLINE Take 1 tablet (25 mg total) by mouth every 8 (eight) hours.   linagliptin 5 MG Tabs tablet Commonly known as:  TRADJENTA Take 1 tablet (5 mg total) by mouth daily.   losartan 50 MG tablet Commonly known as:  COZAAR TAKE 1 TABLET ONCE DAILY TO CONTROL  BLOOD PRESSURE. What changed:  See the new instructions.   metFORMIN 500 MG 24 hr tablet Commonly known as:  GLUCOPHAGE-XR Take 500 mg by mouth at bedtime.   metoprolol tartrate 25 MG tablet Commonly known as:  LOPRESSOR Take 0.5 tablets (12.5 mg total) by mouth 2 (two) times daily.   multivitamin with minerals Tabs tablet Take 1 tablet by mouth daily.   nitroGLYCERIN 0.4 MG SL tablet Commonly known as:  NITROSTAT Place 0.4 mg  under the tongue every 5 (five) minutes as needed for chest pain.   OPTIVE 0.5-0.9 % ophthalmic solution Generic drug:  carboxymethylcellul-glycerin Place 2 drops into the left eye 3 (three) times daily as needed for dry eyes.      Follow-up Information    Mast, Man X, NP. Schedule an appointment as soon as possible for a visit in 1 week(s).   Specialty:  Internal Medicine Contact information: [address] [address] [phone]        Dixie Dials, MD. Schedule an appointment as soon as possible for a visit in 1 month(s).   Specialty:  Cardiology Contact information: [place] [address] [phone]           Signed: Birdie Riddle 06/28/2018, 2:49 PM

## 2018-06-28 NOTE — Progress Notes (Signed)
CSW spoke to Education officer, museum at [facility]. They would like patient to be evaluated by PT/OT prior to discharge to determine if AL still appropriate level of care or if higher level needed. PT and OT now ordered. Full CSW assessment to follow.  Estanislado Emms, LCSW [phone]

## 2018-06-28 NOTE — Progress Notes (Signed)
Patient will discharge back to [facility]. Anticipated discharge date: 06/28/18 Family notified: Edmon Crape, cargiver/aide (left message) Transportation by: [facility]AR  Nurse to call report to [phone] ext [phone] or ext [phone].  CSW signing off.  Estanislado Emms, [place]  Clinical Social Worker

## 2018-06-28 NOTE — Evaluation (Signed)
Physical Therapy Evaluation Patient Details Name: [name] MRN: [id number] DOB: October 27, 1925 Today's Date: 06/28/2018   History of Present Illness  84yo female with left upper chest pain, not relieved by nitroglycerin, and with mild troponin increase but no ischemic changes on EKG. Admitted for ACS. PMH hx T10/T12 kyphoplasty, L partial TKR, hx C4-C5 and C5-C6 titanium plates, R TKR, memory loss, DM   Clinical Impression   Patient received in bed, very pleasant and willing to work with therapy, and able to complete functional bed mobility, transfers with RW, and gait approximately 25f x2 with RW and min guard today. Mod cues for safety and sequencing due to poor safety awareness. No family or caregivers present to provide details however per RN [facility] as a sActuaryat that facility. Feel that patient is likely generally at her baseline level of function and that she should be able to [facility] to [facility] with skilled HHPT services (or in house PT if available) once medically cleared for discharge. She was left in bed with all needs met and bed alarm active, RN and social worker aware of mobility and PT recommendation.     Follow Up Recommendations Home health PT(unless [facility] has in house PT services )    Equipment Recommendations  None recommended by PT    Recommendations for Other Services       Precautions / Restrictions Precautions Precautions: Fall;Other (comment) Precaution Comments: impulsive  Restrictions Weight Bearing Restrictions: No      Mobility  Bed Mobility Overal bed mobility: Needs Assistance Bed Mobility: Supine to Sit;Sit to Supine     Supine to sit: Min guard Sit to supine: Min guard   General bed mobility comments: min guard for safety and sequencing   Transfers Overall transfer level: Needs assistance Equipment used: Rolling walker (2 wheeled) Transfers: Sit to/from Stand Sit to Stand:  Min guard         General transfer comment: min guard for safety, no physical assist given   Ambulation/Gait Ambulation/Gait assistance: Min guard Gait Distance (Feet): 40 Feet(262f2) Assistive device: Rolling walker (2 wheeled) Gait Pattern/deviations: Step-through pattern;Decreased step length - right;Decreased step length - left;Decreased dorsiflexion - right;Decreased dorsiflexion - left;Trunk flexed Gait velocity: decreased    General Gait Details: min guard for safety, no physical assist given  Stairs            Wheelchair Mobility    Modified Rankin (Stroke Patients Only)       Balance Overall balance assessment: Needs assistance Sitting-balance support: Bilateral upper extremity supported;Feet supported Sitting balance-Leahy Scale: Good Sitting balance - Comments: able to reach for sock sitting at EOB with SBA    Standing balance support: Bilateral upper extremity supported;During functional activity Standing balance-Leahy Scale: Fair Standing balance comment: reliant on B UE support, poor general safety awareness                              Pertinent Vitals/Pain Pain Assessment: No/denies pain    Home Living Family/patient expects to be discharged to:: Assisted living               Home Equipment: Walker - 2 wheels Additional Comments: no caregivers or family present to provide further details; per RN, [facility] and has a siActuary   Prior Function Level of Independence: Needs assistance   Gait / Transfers Assistance Needed:  unsure, no family or caregivers present            Hand Dominance        Extremity/Trunk Assessment   Upper Extremity Assessment Upper Extremity Assessment: Defer to OT evaluation    Lower Extremity Assessment Lower Extremity Assessment: Generalized weakness    Cervical / Trunk Assessment Cervical / Trunk Assessment: Kyphotic  Communication   Communication: No difficulties   Cognition Arousal/Alertness: Awake/alert Behavior During Therapy: WFL for tasks assessed/performed Overall Cognitive Status: History of cognitive impairments - at baseline                                 General Comments: requires VC for safety and sequencing       General Comments      Exercises     Assessment/Plan    PT Assessment Patient needs continued PT services  PT Problem List Decreased strength;Decreased balance;Decreased mobility;Decreased knowledge of use of DME;Decreased activity tolerance;Decreased coordination;Decreased safety awareness       PT Treatment Interventions DME instruction;Functional mobility training;Balance training;Patient/family education;Gait training;Therapeutic activities;Neuromuscular re-education;Stair training;Therapeutic exercise    PT Goals (Current goals can be found in the Care Plan section)  Acute Rehab PT Goals Patient Stated Goal: [facility]  PT Goal Formulation: With patient Time For Goal Achievement: 07/12/18 Potential to Achieve Goals: Good    Frequency Min 3X/week   Barriers to discharge        Co-evaluation               AM-PAC PT "6 Clicks" Mobility  Outcome Measure Help needed turning from your back to your side while in a flat bed without using bedrails?: A Little Help needed moving from lying on your back to sitting on the side of a flat bed without using bedrails?: A Little Help needed moving to and from a bed to a chair (including a wheelchair)?: A Little Help needed standing up from a chair using your arms (e.g., wheelchair or bedside chair)?: A Little Help needed to walk in hospital room?: A Little Help needed climbing 3-5 steps with a railing? : A Lot 6 Click Score: 17    End of Session   Activity Tolerance: Patient tolerated treatment well Patient left: in bed;with bed alarm set;with call bell/phone within reach Nurse Communication: Mobility status PT Visit Diagnosis:  Unsteadiness on feet (R26.81);Muscle weakness (generalized) (M62.81);Difficulty in walking, not elsewhere classified (R26.2)    Time: 4782-9562 PT Time Calculation (min) (ACUTE ONLY): 23 min   Charges:   PT Evaluation $PT Eval Low Complexity: 1 Low PT Treatments $Gait Training: 8-22 mins        Deniece Ree PT, DPT, CBIS  Supplemental Physical Therapist [place]    Pager [phone] Acute Rehab Office [phone]

## 2018-06-29 ENCOUNTER — Telehealth: Payer: Self-pay

## 2018-06-29 NOTE — Telephone Encounter (Signed)
Tried calling to complete TCM call however both phones were disconnected and was unable to go through. I will try at another time.

## 2018-06-29 NOTE — Telephone Encounter (Signed)
Second attempt to complete TCM call. Both phone lines are still disconnected. I am unable to complete call or leave a voicemail

## 2018-07-07 DIAGNOSIS — M859 Disorder of bone density and structure, unspecified: Secondary | ICD-10-CM | POA: Diagnosis not present

## 2018-07-07 DIAGNOSIS — R531 Weakness: Secondary | ICD-10-CM | POA: Diagnosis not present

## 2018-07-07 DIAGNOSIS — E0590 Thyrotoxicosis, unspecified without thyrotoxic crisis or storm: Secondary | ICD-10-CM | POA: Diagnosis not present

## 2018-07-07 DIAGNOSIS — R29898 Other symptoms and signs involving the musculoskeletal system: Secondary | ICD-10-CM | POA: Diagnosis not present

## 2018-07-07 DIAGNOSIS — M6281 Muscle weakness (generalized): Secondary | ICD-10-CM | POA: Diagnosis not present

## 2018-07-07 DIAGNOSIS — R413 Other amnesia: Secondary | ICD-10-CM | POA: Diagnosis not present

## 2018-07-07 DIAGNOSIS — R29818 Other symptoms and signs involving the nervous system: Secondary | ICD-10-CM | POA: Diagnosis not present

## 2018-07-07 DIAGNOSIS — R293 Abnormal posture: Secondary | ICD-10-CM | POA: Diagnosis not present

## 2018-07-07 DIAGNOSIS — R32 Unspecified urinary incontinence: Secondary | ICD-10-CM | POA: Diagnosis not present

## 2018-07-19 ENCOUNTER — Encounter: Payer: Self-pay | Admitting: Nurse Practitioner

## 2018-07-19 ENCOUNTER — Non-Acute Institutional Stay: Payer: Medicare Other | Admitting: Nurse Practitioner

## 2018-07-19 DIAGNOSIS — L309 Dermatitis, unspecified: Secondary | ICD-10-CM | POA: Diagnosis not present

## 2018-07-19 DIAGNOSIS — E1142 Type 2 diabetes mellitus with diabetic polyneuropathy: Secondary | ICD-10-CM | POA: Diagnosis not present

## 2018-07-19 DIAGNOSIS — I10 Essential (primary) hypertension: Secondary | ICD-10-CM

## 2018-07-19 NOTE — Progress Notes (Signed)
Location:  [place] Room Number: 578 Place of Service:  ALF [phone]) Provider:  Marlana Latus   NP  Skippy Marhefka X, NP  Patient Care Team: Dyann Goodspeed X, NP as PCP - General (Internal Medicine) [place], [facility] Elyse Prevo X, NP as Nurse Practitioner (Nurse Practitioner) Jacelyn Pi, MD as Consulting Physician (Endocrinology) Calvert Cantor, MD as Consulting Physician (Ophthalmology) Vickey Huger, MD as Consulting Physician (Orthopedic Surgery) Suella Broad, MD as Consulting Physician (Physical Medicine and Rehabilitation) Erline Levine, MD as Consulting Physician (Neurosurgery) Juanita Craver, MD as Consulting Physician (Gastroenterology) Martinique, Peter M, MD as Consulting Physician (Cardiology) Megan Salon, MD as Consulting Physician (Gynecology)  Extended Emergency Contact Information Primary Emergency Contact: [name], [place] Phone: [phone] Relation: Friend Secondary Emergency Contact: Edmon Crape Mobile Phone: [phone] Relation: Other Preferred language: Cleophus Molt Interpreter needed? No Guardian: Meryl Dare Mobile Phone: [phone] Relation: Legal Guardian Preferred language: English Interpreter needed? No  Code Status:  DNR Goals of care: Advanced Directive information Advanced Directives 07/20/2018  Does Patient Have a Medical Advance Directive? Yes  Type of Paramedic of [place];Living will  Does patient want to make changes to medical advance directive? No - Patient declined  Copy of Tool in Chart? Yes - validated most recent copy scanned in chart (See row information)  Would patient like information on creating a medical advance directive? -  Pre-existing out of facility DNR order (yellow form or pink MOST form) -     Chief Complaint  Patient presents with  . Acute Visit    C/o- rash (LLL) ankle area- red scaly  patches    HPI:  Pt is a 85 y.o. female seen today for an acute visit for itching, red, raised spots x 2-3 days, located in BLE above ankles, anterior/lateral thighs, and mid back, scattered in distribution. She denied new cleansing/cosmetic products, garments, or bedding. New medications: Tradjenta 5mg  qd, Metoprolol 12.5mg  bid, Amlodipine 5mg  qd, Hydralazine 25mg  q8h, and Atorvastatin 40mg  qd  from hospital stay 06/26/18-06/28/18 for acute coronary syndrome and T2DM. Echocardiogram showed EF 65-70%. HTN, is controlled, no further c/o of chest pain.    Past Medical History:  Diagnosis Date  . Contact dermatitis and other eczema, due to unspecified cause   . Cortical senile cataract   . GERD (gastroesophageal reflux disease) 02/17/2013  . Hyperparathyroidism, unspecified ([place]) 03/26/2010  . Irritable bowel syndrome   . Keratoconjunctivitis sicca, not specified as Sjogren's   . Loss of weight   . Macular degeneration (senile) of retina, unspecified   . Memory loss   . Other and unspecified hyperlipidemia   . Other malaise and fatigue 06/17/2010  . Pain in joint, site unspecified   . Pathologic fracture of vertebrae   . Reflux esophagitis   . Scoliosis (and kyphoscoliosis), idiopathic   . Senile osteoporosis   . Type II or unspecified type diabetes mellitus without mention of complication, uncontrolled   . Unspecified essential hypertension   . Unspecified hereditary and idiopathic peripheral neuropathy   . Unspecified vitamin D deficiency    Past Surgical History:  Procedure Laterality Date  . APPENDECTOMY    . BREAST LUMPECTOMY  02/05/2010   Dr. Christie Beckers  . CATARACT EXTRACTION W/ INTRAOCULAR LENS  IMPLANT, BILATERAL  04/2013   Dr. Bing Plume  . CHOLECYSTECTOMY  2004   Dr. Zella Richer  . COLONOSCOPY  2001  Dr. Collene Mares  . fibroidectomy  1950  . [place]  . KYPHOPLASTY  2010   T10 & T12 Dr. Erline Levine  . MEDIAL PARTIAL KNEE REPLACEMENT Left 2004   Dr. Ronnie Derby  .  SPINE SURGERY     vertebroplasty T10, T12  . SPINE SURGERY  11/02/2001    C4-5 & C5-6 Titanium Plate Placed Dr. Carloyn Manner  . THYROIDECTOMY, PARTIAL Left 2007  . TOTAL KNEE ARTHROPLASTY Right 2009   Dr. Alvan Dame     Allergies  Allergen Reactions  . Bactrim     unknown  . Betadine [Povidone Iodine]     unknown  . Metformin And Related     Hurt stomach  . Nutmeg Oil (Myristica Oil)     unknown  . Sulfa Antibiotics     unknown    Outpatient Encounter Medications as of 07/19/2018  Medication Sig  . amLODipine (NORVASC) 5 MG tablet Take 1 tablet (5 mg total) by mouth daily.  Marland Kitchen aspirin EC 81 MG EC tablet Take 1 tablet (81 mg total) by mouth daily.  Marland Kitchen atorvastatin (LIPITOR) 40 MG tablet Take 1 tablet (40 mg total) by mouth daily at 6 PM.  . carboxymethylcellul-glycerin (OPTIVE) 0.5-0.9 % ophthalmic solution Place 2 drops into the left eye 3 (three) times daily as needed for dry eyes.  . hydrALAZINE (APRESOLINE) 25 MG tablet Take 1 tablet (25 mg total) by mouth every 8 (eight) hours.  Marland Kitchen linagliptin (TRADJENTA) 5 MG TABS tablet Take 1 tablet (5 mg total) by mouth daily.  Marland Kitchen losartan (COZAAR) 50 MG tablet TAKE 1 TABLET ONCE DAILY TO CONTROL BLOOD PRESSURE.  . metFORMIN (GLUCOPHAGE-XR) 500 MG 24 hr tablet Take 500 mg by mouth at bedtime.  . metoprolol tartrate (LOPRESSOR) 25 MG tablet Take 0.5 tablets (12.5 mg total) by mouth 2 (two) times daily.  . Multiple Vitamin (MULTIVITAMIN WITH MINERALS) TABS tablet Take 1 tablet by mouth daily.  . nitroGLYCERIN (NITROSTAT) 0.4 MG SL tablet Place 0.4 mg under the tongue every 5 (five) minutes as needed for chest pain.   No facility-administered encounter medications on file as of 07/19/2018.     Review of Systems  Constitutional: Negative for activity change, appetite change, chills, diaphoresis, fatigue and fever.  HENT: Positive for hearing loss. Negative for congestion and voice change.   Respiratory: Negative for cough, shortness of breath and wheezing.     Cardiovascular: Positive for leg swelling. Negative for chest pain and palpitations.  Gastrointestinal: Negative for abdominal distention, abdominal pain, constipation, diarrhea, nausea and vomiting.  Genitourinary: Negative for difficulty urinating, dysuria, frequency and urgency.  Musculoskeletal: Positive for arthralgias, back pain and gait problem.  Skin: Positive for color change and rash.  Neurological: Negative for dizziness, speech difficulty, weakness and headaches.       Memory lapses.   Psychiatric/Behavioral: Negative for agitation, behavioral problems, hallucinations and sleep disturbance. The patient is not nervous/anxious.     Immunization History  Administered Date(s) Administered  . Influenza Whole 03/30/2012, 04/13/2013, 04/04/2018  . Influenza-Unspecified 04/17/2014, 03/29/2015  . Pneumococcal Polysaccharide-23 06/30/2001  . Td 06/30/2001   Pertinent  Health Maintenance Due  Topic Date Due  . PNA vac Low Risk Adult (2 of 2 - PCV13) 06/30/2002  . FOOT EXAM  12/21/2015  . OPHTHALMOLOGY EXAM  04/02/2016  . HEMOGLOBIN A1C  11/09/2018  . INFLUENZA VACCINE  Completed  . DEXA SCAN  Completed   Fall Risk  11/24/2017 07/09/2017 11/01/2015 07/26/2015 06/21/2015  Falls in the past  year? No No No No No   Functional Status Survey:    Vitals:   07/19/18 1106  BP: 130/70  Pulse: 76  Resp: 20  Temp: 99.1 F (37.3 C)  SpO2: 93%  Weight: 116 lb (52.6 kg)  Height: 5\' 3"  (1.6 m)   Body mass index is 20.55 kg/m. Physical Exam Constitutional:      General: She is not in acute distress.    Appearance: Normal appearance. She is not ill-appearing, toxic-appearing or diaphoretic.  HENT:     Head: Normocephalic and atraumatic.     Nose: Nose normal.     Mouth/Throat:     Mouth: Mucous membranes are moist.  Eyes:     Extraocular Movements: Extraocular movements intact.     Conjunctiva/sclera: Conjunctivae normal.     Pupils: Pupils are equal, round, and reactive to light.   Neck:     Musculoskeletal: Normal range of motion and neck supple.  Cardiovascular:     Rate and Rhythm: Normal rate and regular rhythm.     Heart sounds: No murmur.  Pulmonary:     Effort: Pulmonary effort is normal.     Breath sounds: Normal breath sounds. No wheezing, rhonchi or rales.  Abdominal:     General: There is no distension.     Palpations: Abdomen is soft.     Tenderness: There is no abdominal tenderness. There is no guarding or rebound.  Musculoskeletal:     Right lower leg: Edema present.     Left lower leg: Edema present.     Comments: Trace edema BLE. Ambulates with walker,   Skin:    General: Skin is warm and dry.     Findings: Rash present. No bruising, erythema or lesion.     Comments: Scattered reddened itching scaly raised spots above ankles R+L, anterior/lateral R+L thighs, mid back.   Neurological:     General: No focal deficit present.     Mental Status: She is alert. Mental status is at baseline.     Cranial Nerves: No cranial nerve deficit.     Motor: No weakness.     Coordination: Coordination normal.     Gait: Gait abnormal.     Comments: Oriented to person and place.   Psychiatric:        Mood and Affect: Mood normal.        Behavior: Behavior normal.        Thought Content: Thought content normal.     Labs reviewed: Recent Labs    12/29/17 06/26/18 2343 06/27/18 1015  NA 138 135 138  K 4.1 4.0 4.0  CL 104 102 102  CO2 26 24 27   GLUCOSE  --  265* 243*  BUN 10 16 12   CREATININE 0.7 0.78 0.76  CALCIUM 10.7 10.2 10.5*   Recent Labs    11/19/17 12/29/17 06/26/18 2343  AST 21 18 23   ALT 16 15 16   ALKPHOS 65 74 54  BILITOT  --   --  0.6  PROT  --   --  5.7*  ALBUMIN 4.2 3.6 3.4*   Recent Labs    04/01/18 06/26/18 2343 06/28/18 0200  WBC 7.8 10.3 9.7  HGB 11.8* 11.3* 10.7*  HCT 35* 34.5* 32.4*  MCV  --  94.5 93.1  PLT 236 234 236   Lab Results  Component Value Date   TSH 0.45 08/13/2017   Lab Results  Component Value  Date   HGBA1C 6.7 05/11/2018   Lab Results  Component Value Date   CHOL 169 06/27/2018   HDL 70 06/27/2018   LDLCALC 96 06/27/2018   TRIG 17 06/27/2018   CHOLHDL 2.4 06/27/2018    Significant Diagnostic Results in last 30 days:  Dg Chest 2 View  Result Date: 06/27/2018 CLINICAL DATA:  Chest pain EXAM: CHEST - 2 VIEW COMPARISON:  None. FINDINGS: The heart size and mediastinal contours are within normal limits. Both lungs are clear. The visualized skeletal structures are unremarkable. Prior vertebral augmentation. IMPRESSION: No active cardiopulmonary disease. Electronically Signed   By: Ulyses Jarred M.D.   On: 06/27/2018 00:16    Assessment/Plan Dermatitis BLE above ankles, anterior/lateral thighs, mid back, scaly scattered red raised itching spots, no s/s of infection. Allergic vs viral in nature, will apply 1% hydrocortisone cream daily to affected area x 10 days, Claritin 10mg  qd x 7 days. Observe.   Essential hypertension Blood pressure is in controlled, continue Losartan, Metoprolol, Amlodipine, and Hydralazine.   DM type 2 with diabetic peripheral neuropathy ([place]) Continue Metformin and Tradjenta.      Family/ staff Communication: plan of care reviewed with the patient and charge nurse.   Labs/tests ordered:  none  Time spend 25 minutes.

## 2018-07-19 NOTE — Assessment & Plan Note (Signed)
Continue Metformin and Tradjenta.

## 2018-07-19 NOTE — Assessment & Plan Note (Signed)
Blood pressure is in controlled, continue Losartan, Metoprolol, Amlodipine, and Hydralazine.

## 2018-07-19 NOTE — Assessment & Plan Note (Signed)
BLE above ankles, anterior/lateral thighs, mid back, scaly scattered red raised itching spots, no s/s of infection. Allergic vs viral in nature, will apply 1% hydrocortisone cream daily to affected area x 10 days, Claritin 10mg  qd x 7 days. Observe.

## 2018-07-20 ENCOUNTER — Encounter: Payer: Self-pay | Admitting: Internal Medicine

## 2018-07-20 ENCOUNTER — Non-Acute Institutional Stay: Payer: Medicare Other | Admitting: Internal Medicine

## 2018-07-20 DIAGNOSIS — I10 Essential (primary) hypertension: Secondary | ICD-10-CM | POA: Diagnosis not present

## 2018-07-20 DIAGNOSIS — L03115 Cellulitis of right lower limb: Secondary | ICD-10-CM | POA: Diagnosis not present

## 2018-07-20 DIAGNOSIS — R4189 Other symptoms and signs involving cognitive functions and awareness: Secondary | ICD-10-CM | POA: Diagnosis not present

## 2018-07-20 DIAGNOSIS — E1142 Type 2 diabetes mellitus with diabetic polyneuropathy: Secondary | ICD-10-CM

## 2018-07-20 DIAGNOSIS — I214 Non-ST elevation (NSTEMI) myocardial infarction: Secondary | ICD-10-CM | POA: Insufficient documentation

## 2018-07-20 DIAGNOSIS — L270 Generalized skin eruption due to drugs and medicaments taken internally: Secondary | ICD-10-CM | POA: Diagnosis not present

## 2018-07-20 NOTE — Progress Notes (Signed)
Provider:   Location:  [place] Room Number: 268 Place of Service:  SNF (31)  PCP: Mast, Man X, NP Patient Care Team: Mast, Man X, NP as PCP - General (Internal Medicine) [place] Mast, Man X, NP as Nurse Practitioner (Nurse Practitioner) Jacelyn Pi, MD as Consulting Physician (Endocrinology) Calvert Cantor, MD as Consulting Physician (Ophthalmology) Vickey Huger, MD as Consulting Physician (Orthopedic Surgery) Suella Broad, MD as Consulting Physician (Physical Medicine and Rehabilitation) Erline Levine, MD as Consulting Physician (Neurosurgery) Juanita Craver, MD as Consulting Physician (Gastroenterology) Martinique, Peter M, MD as Consulting Physician (Cardiology) Megan Salon, MD as Consulting Physician (Gynecology)  Extended Emergency Contact Information Primary Emergency Contact: [name], [place] Phone: [phone] Relation: Friend Secondary Emergency Contact: Edmon Crape Mobile Phone: [phone] Relation: Other Preferred language: Cleophus Molt Interpreter needed? No Guardian: Meryl Dare Mobile Phone: [phone] Relation: Legal Guardian Preferred language: English Interpreter needed? No  Code Status: DNR Goals of Care: Advanced Directive information Advanced Directives 07/20/2018  Does Patient Have a Medical Advance Directive? Yes  Type of Paramedic of [place];Living will  Does patient want to make changes to medical advance directive? No - Patient declined  Copy of [place] in Chart? Yes - validated most recent copy scanned in chart (See row information)  Would patient like information on creating a medical advance directive? -  Pre-existing out of facility DNR order (yellow form or pink MOST form) -      Chief Complaint  Patient presents with  . New Admit To SNF    readmit to facility     HPI: Patient is a 85  y.o. female seen today for Readmission to ALF Patient stayed in the hospital from 12/28 - 12/30 For NSTEMI Patient has h/o Hypertension, Diabetes Mellitus Type 2 , and Cognitive impairnement. She was send to ED for Left sided Chest Pian. She was found to have NSTEMI with Mildly Elevated Troponin's.  She was treated with IV heparin. Started on Beta Blocker, and Statin. Her family wanted Conservative Management. Her EKG and Echo did not show any acute Changes Patient got better and was eventually send back to AL. She has not had anymore Chest Pain. She walks with her rolling Walker. Says she does not have any SOB or Cough. She though has broke up in a rash which has spread to her Legs back and arms. And it is itching. She also has redness around her Right Lower Leg which is warm but not Tender. Patient did wanted to know why she is on so many new Meds and got upset that she had Coronary event. It seems she had forgotten that she wa sin the hospital for this.     Past Medical History:  Diagnosis Date  . Contact dermatitis and other eczema, due to unspecified cause   . Cortical senile cataract   . GERD (gastroesophageal reflux disease) 02/17/2013  . Hyperparathyroidism, unspecified ([place]) 03/26/2010  . Irritable bowel syndrome   . Keratoconjunctivitis sicca, not specified as Sjogren's   . Loss of weight   . Macular degeneration (senile) of retina, unspecified   . Memory loss   . Other and unspecified hyperlipidemia   . Other malaise and fatigue 06/17/2010  . Pain in joint, site unspecified   . Pathologic fracture of vertebrae   . Reflux esophagitis   . Scoliosis (and kyphoscoliosis), idiopathic   . Senile  osteoporosis   . Type II or unspecified type diabetes mellitus without mention of complication, uncontrolled   . Unspecified essential hypertension   . Unspecified hereditary and idiopathic peripheral neuropathy   . Unspecified vitamin D deficiency    Past Surgical History:    Procedure Laterality Date  . APPENDECTOMY    . BREAST LUMPECTOMY  02/05/2010   Dr. Christie Beckers  . CATARACT EXTRACTION W/ INTRAOCULAR LENS  IMPLANT, BILATERAL  04/2013   Dr. Bing Plume  . CHOLECYSTECTOMY  2004   Dr. Zella Richer  . COLONOSCOPY  2001   Dr. Collene Mares  . fibroidectomy  1950  . [place]  . KYPHOPLASTY  2010   T10 & T12 Dr. Erline Levine  . MEDIAL PARTIAL KNEE REPLACEMENT Left 2004   Dr. Ronnie Derby  . SPINE SURGERY     vertebroplasty T10, T12  . SPINE SURGERY  11/02/2001    C4-5 & C5-6 Titanium Plate Placed Dr. Carloyn Manner  . THYROIDECTOMY, PARTIAL Left 2007  . TOTAL KNEE ARTHROPLASTY Right 2009   Dr. Alvan Dame     reports that she has never smoked. She has never used smokeless tobacco. She reports that she does not drink alcohol or use drugs. Social History   Socioeconomic History  . Marital status: Widowed    Spouse name: Not on file  . Number of children: 0  . Years of education: Not on file  . Highest education level: Not on file  Occupational History  . Occupation: retired Marketing executive: RETIRED  Social Needs  . Financial resource strain: Not hard at all  . Food insecurity:    Worry: Never true    Inability: Never true  . Transportation needs:    Medical: No    Non-medical: No  Tobacco Use  . Smoking status: Never Smoker  . Smokeless tobacco: Never Used  Substance and Sexual Activity  . Alcohol use: No  . Drug use: No  . Sexual activity: Never    Partners: Male    Birth control/protection: Post-menopausal  Lifestyle  . Physical activity:    Days per week: 7 days    Minutes per session: 20 min  . Stress: Only a little  Relationships  . Social connections:    Talks on phone: More than three times a week    Gets together: More than three times a week    Attends religious service: Never    Active member of club or organization: No    Attends meetings of clubs or organizations: Never    Relationship status: Widowed  . Intimate  partner violence:    Fear of current or ex partner: No    Emotionally abused: No    Physically abused: No    Forced sexual activity: No  Other Topics Concern  . Not on file  Social History Narrative   Lives at [facility] since 02/06/2009   Widowed    No childred   Living Will    Functional Status Survey:    Family History  Problem Relation Age of Onset  . Diabetes Mother   . Heart disease Mother        CHF  . Stroke Father   . Heart disease Sister        MI  . Colon cancer Neg Hx   . Stomach cancer Neg Hx   . Esophageal cancer Neg Hx   . Rectal cancer Neg Hx   . Liver cancer Neg Hx  Health Maintenance  Topic Date Due  . PNA vac Low Risk Adult (2 of 2 - PCV13) 06/30/2002  . TETANUS/TDAP  07/01/2011  . FOOT EXAM  12/21/2015  . OPHTHALMOLOGY EXAM  04/02/2016  . HEMOGLOBIN A1C  11/09/2018  . INFLUENZA VACCINE  Completed  . DEXA SCAN  Completed    Allergies  Allergen Reactions  . Bactrim     unknown  . Betadine [Povidone Iodine]     unknown  . Metformin And Related     Hurt stomach  . Nutmeg Oil (Myristica Oil)     unknown  . Sulfa Antibiotics     unknown    Outpatient Encounter Medications as of 07/20/2018  Medication Sig  . amLODipine (NORVASC) 5 MG tablet Take 1 tablet (5 mg total) by mouth daily.  Marland Kitchen aspirin EC 81 MG EC tablet Take 1 tablet (81 mg total) by mouth daily.  Marland Kitchen atorvastatin (LIPITOR) 40 MG tablet Take 1 tablet (40 mg total) by mouth daily at 6 PM.  . carboxymethylcellul-glycerin (OPTIVE) 0.5-0.9 % ophthalmic solution Place 2 drops into the left eye 3 (three) times daily as needed for dry eyes.  . hydrALAZINE (APRESOLINE) 25 MG tablet Take 1 tablet (25 mg total) by mouth every 8 (eight) hours.  Marland Kitchen linagliptin (TRADJENTA) 5 MG TABS tablet Take 1 tablet (5 mg total) by mouth daily.  Marland Kitchen losartan (COZAAR) 50 MG tablet TAKE 1 TABLET ONCE DAILY TO CONTROL BLOOD PRESSURE.  . metFORMIN (GLUCOPHAGE-XR) 500 MG 24 hr tablet Take 500 mg by  mouth at bedtime.  . metoprolol tartrate (LOPRESSOR) 25 MG tablet Take 0.5 tablets (12.5 mg total) by mouth 2 (two) times daily.  . Multiple Vitamin (MULTIVITAMIN WITH MINERALS) TABS tablet Take 1 tablet by mouth daily.  . nitroGLYCERIN (NITROSTAT) 0.4 MG SL tablet Place 0.4 mg under the tongue every 5 (five) minutes as needed for chest pain.   No facility-administered encounter medications on file as of 07/20/2018.     Review of Systems  Constitutional: Negative.   HENT: Negative.   Respiratory: Negative.   Cardiovascular: Negative.   Gastrointestinal: Negative.   Genitourinary: Negative.   Musculoskeletal: Negative.   Skin: Positive for rash.  Neurological: Negative.   Psychiatric/Behavioral: Negative.     Vitals:   07/20/18 0956  BP: 132/68  Pulse: 72  Resp: 20  Temp: 99.1 F (37.3 C)  SpO2: 93%  Weight: 116 lb (52.6 kg)  Height: 5\' 3"  (1.6 m)   Body mass index is 20.55 kg/m. Physical Exam Vitals signs and nursing note reviewed.  Constitutional:      Appearance: Normal appearance.  HENT:     Head: Normocephalic.     Nose: Nose normal.     Mouth/Throat:     Mouth: Mucous membranes are moist.     Pharynx: Oropharynx is clear.  Eyes:     Pupils: Pupils are equal, round, and reactive to light.  Neck:     Musculoskeletal: Neck supple.  Cardiovascular:     Rate and Rhythm: Normal rate and regular rhythm.     Pulses: Normal pulses.     Heart sounds: Normal heart sounds. No murmur.  Pulmonary:     Effort: Pulmonary effort is normal. No respiratory distress.     Breath sounds: Normal breath sounds. No wheezing.  Abdominal:     General: Abdomen is flat. Bowel sounds are normal. There is no distension.     Palpations: Abdomen is soft.     Tenderness: There is no  abdominal tenderness.  Musculoskeletal:        General: No swelling.  Skin:    General: Skin is warm.     Comments: Has macular Rash in her Back and her arms and legs She also has Cellulitis Looking rash  in her Right Leg near her ankle  Neurological:     General: No focal deficit present.     Mental Status: She is alert and oriented to person, place, and time.  Psychiatric:        Mood and Affect: Mood normal.        Thought Content: Thought content normal.        Judgment: Judgment normal.     Labs reviewed: Basic Metabolic Panel: Recent Labs    12/29/17 06/26/18 2343 06/27/18 1015  NA 138 135 138  K 4.1 4.0 4.0  CL 104 102 102  CO2 26 24 27   GLUCOSE  --  265* 243*  BUN 10 16 12   CREATININE 0.7 0.78 0.76  CALCIUM 10.7 10.2 10.5*   Liver Function Tests: Recent Labs    11/19/17 12/29/17 06/26/18 2343  AST 21 18 23   ALT 16 15 16   ALKPHOS 65 74 54  BILITOT  --   --  0.6  PROT  --   --  5.7*  ALBUMIN 4.2 3.6 3.4*   Recent Labs    06/26/18 2343  LIPASE 39   No results for input(s): AMMONIA in the last 8760 hours. CBC: Recent Labs    04/01/18 06/26/18 2343 06/28/18 0200  WBC 7.8 10.3 9.7  HGB 11.8* 11.3* 10.7*  HCT 35* 34.5* 32.4*  MCV  --  94.5 93.1  PLT 236 234 236   Cardiac Enzymes: Recent Labs    06/27/18 0343 06/27/18 1015 06/27/18 1646  TROPONINI 0.60* 0.85* 0.71*   BNP: Invalid input(s): POCBNP Lab Results  Component Value Date   HGBA1C 6.7 05/11/2018   Lab Results  Component Value Date   TSH 0.45 08/13/2017   No results found for: VITAMINB12 No results found for: FOLATE No results found for: IRON, TIBC, FERRITIN  Imaging and Procedures obtained prior to SNF admission: Dg Chest 2 View  Result Date: 06/27/2018 CLINICAL DATA:  Chest pain EXAM: CHEST - 2 VIEW COMPARISON:  None. FINDINGS: The heart size and mediastinal contours are within normal limits. Both lungs are clear. The visualized skeletal structures are unremarkable. Prior vertebral augmentation. IMPRESSION: No active cardiopulmonary disease. Electronically Signed   By: Ulyses Jarred M.D.   On: 06/27/2018 00:16    Assessment/Plan  Allergic drug rash It seems like patient has  developed a allergic drug rash. Patient was started on a number of new medicines in the hospital.  At this time will will discontinue the statin, hydralazine and Tradjenta I will start her on prednisone 30 mg daily for 5 days We will check her blood pressure daily  NSTEMI (non-ST elevated myocardial infarction)  Was managed conservatively She is on aspirin. On losartan  Also on low-dose of Lopressor   Cellulitis of right lower extremity Has developed cellulitis in her right lower leg by her itching We will start her on doxycycline for 7 days  DM type 2 with diabetic peripheral neuropathy  On metformin Tradjenta was discontinued for now due to rash  Essential hypertension BP controlled Hydralazine discontinued due to Rash BP QD   Cognitive impairment Not very upset today when I told her about the coronary event I think patient does have cognitive impairment Once her  rash resolves we will do a detailed exam and consider Aricept     Family/ staff Communication:   Labs/tests ordered:CBC AND CMP in 1 week Total time spent in this patient care encounter was 60_ minutes; greater than 50% of the visit spent counseling patient, reviewing records , Labs and coordinating care for problems addressed at this encounter.

## 2018-07-22 DIAGNOSIS — I10 Essential (primary) hypertension: Secondary | ICD-10-CM | POA: Diagnosis not present

## 2018-07-22 DIAGNOSIS — I2510 Atherosclerotic heart disease of native coronary artery without angina pectoris: Secondary | ICD-10-CM | POA: Diagnosis not present

## 2018-07-22 DIAGNOSIS — E119 Type 2 diabetes mellitus without complications: Secondary | ICD-10-CM | POA: Diagnosis not present

## 2018-07-22 LAB — CBC AND DIFFERENTIAL
HCT: 33 — AB (ref 36–46)
Hemoglobin: 11.1 — AB (ref 12.0–16.0)
Platelets: 284 (ref 150–399)
WBC: 10.7

## 2018-07-22 LAB — BASIC METABOLIC PANEL
BUN: 17 (ref 4–21)
Creatinine: 0.8 (ref <=1.1)
Glucose: 155
Potassium: 4.2 (ref 3.4–5.3)
Sodium: 139 (ref 137–147)

## 2018-07-22 LAB — HEPATIC FUNCTION PANEL
ALT: 12 (ref 7–35)
AST: 15 (ref 13–35)
Alkaline Phosphatase: 62 (ref 25–125)
Bilirubin, Total: 0.4

## 2018-07-30 ENCOUNTER — Non-Acute Institutional Stay: Payer: Medicare Other | Admitting: Internal Medicine

## 2018-07-30 ENCOUNTER — Encounter: Payer: Self-pay | Admitting: Internal Medicine

## 2018-07-30 DIAGNOSIS — T7840XA Allergy, unspecified, initial encounter: Secondary | ICD-10-CM | POA: Diagnosis not present

## 2018-07-30 DIAGNOSIS — R4189 Other symptoms and signs involving cognitive functions and awareness: Secondary | ICD-10-CM

## 2018-07-30 DIAGNOSIS — E1142 Type 2 diabetes mellitus with diabetic polyneuropathy: Secondary | ICD-10-CM

## 2018-07-30 DIAGNOSIS — I10 Essential (primary) hypertension: Secondary | ICD-10-CM

## 2018-07-30 DIAGNOSIS — I214 Non-ST elevation (NSTEMI) myocardial infarction: Secondary | ICD-10-CM | POA: Diagnosis not present

## 2018-07-30 DIAGNOSIS — E785 Hyperlipidemia, unspecified: Secondary | ICD-10-CM

## 2018-07-30 DIAGNOSIS — F418 Other specified anxiety disorders: Secondary | ICD-10-CM | POA: Diagnosis not present

## 2018-07-30 NOTE — Progress Notes (Signed)
Location:  [place] Room Number: 244 Place of Service:  ALF [phone]) Provider:    Mast, Man X, NP  Patient Care Team: Mast, Man X, NP as PCP - General (Internal Medicine) [place] Mast, Man X, NP as Nurse Practitioner (Nurse Practitioner) Jacelyn Pi, MD as Consulting Physician (Endocrinology) Calvert Cantor, MD as Consulting Physician (Ophthalmology) Vickey Huger, MD as Consulting Physician (Orthopedic Surgery) Suella Broad, MD as Consulting Physician (Physical Medicine and Rehabilitation) Erline Levine, MD as Consulting Physician (Neurosurgery) Juanita Craver, MD as Consulting Physician (Gastroenterology) Martinique, Peter M, MD as Consulting Physician (Cardiology) Megan Salon, MD as Consulting Physician (Gynecology)  Extended Emergency Contact Information Primary Emergency Contact: [name], [place] of [place] Phone: [phone] Relation: Friend Secondary Emergency Contact: Edmon Crape Mobile Phone: [phone] Relation: Other Preferred language: Cleophus Molt Interpreter needed? No Guardian: Meryl Dare Mobile Phone: [phone] Relation: Legal Guardian Preferred language: English Interpreter needed? No  Code Status: DNR Goals of care: Advanced Directive information Advanced Directives 07/30/2018  Does Patient Have a Medical Advance Directive? Yes  Type of Paramedic of [place];Out of facility DNR (pink MOST or yellow form);Living will  Does patient want to make changes to medical advance directive? No - Patient declined  Copy of [place] in Chart? Yes - validated most recent copy scanned in chart (See row information)  Would patient like information on creating a medical advance directive? -  Pre-existing out of facility DNR order (yellow form or pink MOST form) Yellow form placed in chart (order not valid for inpatient use)     Chief  Complaint  Patient presents with  . Acute Visit    HPI:  Pt is a 85 y.o. female seen today for an acute visit for Rash. Patient is Resident of the ALF here in [facility].  Patient was readmitted from the hospital after staying there from 12/28 - 12/30 For NSTEMI  Patient has h/o Hypertension, Diabetes Mellitus Type 2 , and Cognitive impairnement. She was send to ED for Left sided Chest Pian. She was found to have NSTEMI with Mildly Elevated Troponin's.  She was treated with IV heparin. Started on Beta Blocker, and Statin. Her family wanted Conservative Management. Her EKG and Echo did not show any acute Changes Patient got better and was eventually send back to AL.  But since she came back to ALF she break out in a a rash and also developed Cellulitis . I had stopped her Meds including Statin and Hydralazine and Tradjenta. She was also started on Prednisone. better but then she has broken in the Rash again. Her rash is in her back and her Arms She was seen by Cardiology recently and was started on Lasix. Per Nurses they are saying that detergent has been changes recently and ? If she is allergic to that. Patient denies any chest pain or SOB. She says her Rash is itchy.  She is walking with the Walker.    Past Medical History:  Diagnosis Date  . Contact dermatitis and other eczema, due to unspecified cause   . Cortical senile cataract   . GERD (gastroesophageal reflux disease) 02/17/2013  . Hyperparathyroidism, unspecified ([place]) 03/26/2010  . Irritable bowel syndrome   . Keratoconjunctivitis sicca, not specified as Sjogren's   . Loss of weight   . Macular degeneration (senile) of retina, unspecified   . Memory loss   . Other and  unspecified hyperlipidemia   . Other malaise and fatigue 06/17/2010  . Pain in joint, site unspecified   . Pathologic fracture of vertebrae   . Reflux esophagitis   . Scoliosis (and kyphoscoliosis), idiopathic   . Senile osteoporosis   . Type II or  unspecified type diabetes mellitus without mention of complication, uncontrolled   . Unspecified essential hypertension   . Unspecified hereditary and idiopathic peripheral neuropathy   . Unspecified vitamin D deficiency    Past Surgical History:  Procedure Laterality Date  . APPENDECTOMY    . BREAST LUMPECTOMY  02/05/2010   Dr. Christie Beckers  . CATARACT EXTRACTION W/ INTRAOCULAR LENS  IMPLANT, BILATERAL  04/2013   Dr. Bing Plume  . CHOLECYSTECTOMY  2004   Dr. Zella Richer  . COLONOSCOPY  2001   Dr. Collene Mares  . fibroidectomy  1950  . [place]  . KYPHOPLASTY  2010   T10 & T12 Dr. Erline Levine  . MEDIAL PARTIAL KNEE REPLACEMENT Left 2004   Dr. Ronnie Derby  . SPINE SURGERY     vertebroplasty T10, T12  . SPINE SURGERY  11/02/2001    C4-5 & C5-6 Titanium Plate Placed Dr. Carloyn Manner  . THYROIDECTOMY, PARTIAL Left 2007  . TOTAL KNEE ARTHROPLASTY Right 2009   Dr. Alvan Dame     Allergies  Allergen Reactions  . Bactrim     unknown  . Betadine [Povidone Iodine]     unknown  . Metformin And Related     Hurt stomach  . Nutmeg Oil (Myristica Oil)     unknown  . Sulfa Antibiotics     unknown    Outpatient Encounter Medications as of 07/30/2018  Medication Sig  . amLODipine (NORVASC) 5 MG tablet Take 1 tablet (5 mg total) by mouth daily.  Marland Kitchen aspirin EC 81 MG EC tablet Take 1 tablet (81 mg total) by mouth daily.  . carboxymethylcellul-glycerin (OPTIVE) 0.5-0.9 % ophthalmic solution Place 2 drops into the left eye 3 (three) times daily as needed for dry eyes.  . furosemide (LASIX) 20 MG tablet Take 20 mg by mouth daily.  Marland Kitchen losartan (COZAAR) 50 MG tablet TAKE 1 TABLET ONCE DAILY TO CONTROL BLOOD PRESSURE.  . metFORMIN (GLUCOPHAGE-XR) 500 MG 24 hr tablet Take 500 mg by mouth at bedtime.  . metoprolol tartrate (LOPRESSOR) 25 MG tablet Take 0.5 tablets (12.5 mg total) by mouth 2 (two) times daily.  . Multiple Vitamin (MULTIVITAMIN WITH MINERALS) TABS tablet Take 1 tablet by mouth daily.  . nitroGLYCERIN  (NITROSTAT) 0.4 MG SL tablet Place 0.4 mg under the tongue every 5 (five) minutes as needed for chest pain.  Marland Kitchen linagliptin (TRADJENTA) 5 MG TABS tablet Take 1 tablet (5 mg total) by mouth daily. (Patient not taking: Reported on 07/30/2018)  . [DISCONTINUED] atorvastatin (LIPITOR) 40 MG tablet Take 1 tablet (40 mg total) by mouth daily at 6 PM.  . [DISCONTINUED] hydrALAZINE (APRESOLINE) 25 MG tablet Take 1 tablet (25 mg total) by mouth every 8 (eight) hours. (Patient not taking: Reported on 07/30/2018)   No facility-administered encounter medications on file as of 07/30/2018.     Review of Systems  Constitutional: Negative.   HENT: Negative.   Respiratory: Negative.   Cardiovascular: Negative.   Gastrointestinal: Negative.   Genitourinary: Negative.   Musculoskeletal: Negative.   Skin: Positive for rash.  Neurological: Negative.   Psychiatric/Behavioral: Negative.       Immunization History  Administered Date(s) Administered  . Influenza Whole 03/30/2012, 04/13/2013, 04/04/2018  . Influenza-Unspecified 04/17/2014,  03/29/2015  . Pneumococcal Polysaccharide-23 06/30/2001  . Td 06/30/2001   Pertinent  Health Maintenance Due  Topic Date Due  . PNA vac Low Risk Adult (2 of 2 - PCV13) 06/30/2002  . FOOT EXAM  12/21/2015  . OPHTHALMOLOGY EXAM  04/02/2016  . HEMOGLOBIN A1C  11/09/2018  . INFLUENZA VACCINE  Completed  . DEXA SCAN  Completed   Fall Risk  11/24/2017 07/09/2017 11/01/2015 07/26/2015 06/21/2015  Falls in the past year? No No No No No   Functional Status Survey:    Vitals:   07/30/18 1026  BP: 130/60  Pulse: 80  Resp: 18  Temp: 98.4 F (36.9 C)  SpO2: 93%  Weight: 116 lb (52.6 kg)  Height: 5\' 3"  (1.6 m)   Body mass index is 20.55 kg/m. Physical Exam Vitals signs and nursing note reviewed.  Constitutional:      Appearance: Normal appearance.  HENT:     Head: Normocephalic.     Nose: Nose normal.     Mouth/Throat:     Mouth: Mucous membranes are moist.      Pharynx: Oropharynx is clear.  Eyes:     Pupils: Pupils are equal, round, and reactive to light.  Neck:     Musculoskeletal: Neck supple.  Cardiovascular:     Rate and Rhythm: Normal rate and regular rhythm.     Pulses: Normal pulses.     Heart sounds: Normal heart sounds. No murmur.  Pulmonary:     Effort: Pulmonary effort is normal. No respiratory distress.     Breath sounds: Normal breath sounds. No wheezing.  Abdominal:     General: Abdomen is flat. Bowel sounds are normal. There is no distension.     Palpations: Abdomen is soft.     Tenderness: There is no abdominal tenderness.  Musculoskeletal:        General: No swelling.  Skin:    General: Skin is warm.     Comments: Has macular Rash in her Back and her arms and legs Her Cellulitis is resolved  Neurological:     General: No focal deficit present.     Mental Status: She is alert and oriented to person, place, and time.  Psychiatric:        Mood and Affect: Mood normal.        Thought Content: Thought content normal.        Judgment: Judgment normal.     Labs reviewed: Recent Labs    12/29/17 06/26/18 2343 06/27/18 1015  NA 138 135 138  K 4.1 4.0 4.0  CL 104 102 102  CO2 26 24 27   GLUCOSE  --  265* 243*  BUN 10 16 12   CREATININE 0.7 0.78 0.76  CALCIUM 10.7 10.2 10.5*   Recent Labs    11/19/17 12/29/17 06/26/18 2343  AST 21 18 23   ALT 16 15 16   ALKPHOS 65 74 54  BILITOT  --   --  0.6  PROT  --   --  5.7*  ALBUMIN 4.2 3.6 3.4*   Recent Labs    04/01/18 06/26/18 2343 06/28/18 0200  WBC 7.8 10.3 9.7  HGB 11.8* 11.3* 10.7*  HCT 35* 34.5* 32.4*  MCV  --  94.5 93.1  PLT 236 234 236   Lab Results  Component Value Date   TSH 0.45 08/13/2017   Lab Results  Component Value Date   HGBA1C 6.7 05/11/2018   Lab Results  Component Value Date   CHOL 169 06/27/2018  HDL 70 06/27/2018   LDLCALC 96 06/27/2018   TRIG 17 06/27/2018   CHOLHDL 2.4 06/27/2018    Significant Diagnostic Results in last  30 days:  No results found.  Assessment/Plan  Allergic  rash Patient was started on a number of new medicines in the hospital.   I had  discontinued the statin, hydralazine and Tradjenta But she has broken into rash again.  Will start her on prednisone 10 mg QD for 7 days Claritin 10 mg QD Nursing Staff will evaluate to see if it is Detergent in the facility giving her the Rash. Otherwise will refer her to Dermatology.  NSTEMI (non-ST elevated myocardial infarction)  Was managed conservatively She is on aspirin. On losartan  Also on low-dose of Lopressor  And was recently started on Lasix by Cardiology Repeat Bmp in 2 weeks  Cellulitis of right lower extremity Resolved with Doxycycline DM type 2 with diabetic peripheral neuropathy  Tradjenta was discontinued for now due to rash Her BS do go up on Prednisone Will increase her Metformin to BID Essential hypertension BP controlled Hydralazine discontinued due to Rash Cognitive impairment Patient will need detail Cog Exam Once her rash resolves we will do a detailed exam and consider Aricept    Family/ staff Communication:   Labs/tests ordered:  BMP to follow Potassium on Lasix Total time spent in this patient care encounter was _40 minutes; greater than 50% of the visit spent counseling patient, reviewing records , Labs and coordinating care for problems addressed at this encounter.

## 2018-08-10 ENCOUNTER — Non-Acute Institutional Stay: Payer: Medicare Other | Admitting: Internal Medicine

## 2018-08-10 ENCOUNTER — Encounter: Payer: Self-pay | Admitting: Internal Medicine

## 2018-08-10 DIAGNOSIS — I10 Essential (primary) hypertension: Secondary | ICD-10-CM | POA: Diagnosis not present

## 2018-08-10 DIAGNOSIS — T7840XA Allergy, unspecified, initial encounter: Secondary | ICD-10-CM

## 2018-08-10 DIAGNOSIS — E1142 Type 2 diabetes mellitus with diabetic polyneuropathy: Secondary | ICD-10-CM

## 2018-08-10 DIAGNOSIS — I214 Non-ST elevation (NSTEMI) myocardial infarction: Secondary | ICD-10-CM

## 2018-08-10 NOTE — Progress Notes (Signed)
Location:  [place] Room Number: 353 Place of Service:  ALF [phone]) Provider:    Mast, Man X, NP  Patient Care Team: Mast, Man X, NP as PCP - General (Internal Medicine) [place] Mast, Man X, NP as Nurse Practitioner (Nurse Practitioner) Jacelyn Pi, MD as Consulting Physician (Endocrinology) Calvert Cantor, MD as Consulting Physician (Ophthalmology) Vickey Huger, MD as Consulting Physician (Orthopedic Surgery) Suella Broad, MD as Consulting Physician (Physical Medicine and Rehabilitation) Erline Levine, MD as Consulting Physician (Neurosurgery) Juanita Craver, MD as Consulting Physician (Gastroenterology) Martinique, Peter M, MD as Consulting Physician (Cardiology) Megan Salon, MD as Consulting Physician (Gynecology)  Extended Emergency Contact Information Primary Emergency Contact: [name], [place] of [place] Phone: [phone] Relation: Friend Secondary Emergency Contact: Edmon Crape Mobile Phone: [phone] Relation: Other Preferred language: Cleophus Molt Interpreter needed? No Guardian: Meryl Dare Mobile Phone: [phone] Relation: Legal Guardian Preferred language: English Interpreter needed? No  Code Status:  DNR Goals of care: Advanced Directive information Advanced Directives 08/10/2018  Does Patient Have a Medical Advance Directive? Yes  Type of Advance Directive Living will;Out of facility DNR (pink MOST or yellow form)  Does patient want to make changes to medical advance directive? No - Patient declined  Copy of [place] in Chart? -  Would patient like information on creating a medical advance directive? -  Pre-existing out of facility DNR order (yellow form or pink MOST form) Yellow form placed in chart (order not valid for inpatient use)     Chief Complaint  Patient presents with  . Acute Visit    rash    HPI:  Pt is a 85 y.o. female seen  today for an acute visit for Follow up of Her Rash  Pt is a 85 y.o. female seen today for an acute visit for Rash. Patient is Resident of the ALF [facility].  She was Admitted in  the hospital after staying there from 12/28 - 12/30 For NSTEMI  Patient has h/o Hypertension, Diabetes Mellitus Type 2 , and Cognitive impairnement. She sustain NSTEMI and was started on number of medications for medical management.Her EKG and Echo did not show any acute Changes Patient got better and was eventually send back to AL. In the facility patient developed generalized rash all over her body especially worse in her lower extremity with a small area of cellulitis due to itching.  I had stopped her medications including statin hydralazine and Tradjenta.  She was started on low-dose of prednisone.  She did get better but then she had another episode.  Of rash.  She was seen by cardiologist and was started on Lasix. I had started her on a very low-dose of prednisone.  And also on Claritin. Patient seemed to have done well since then.  Rash is much better.  She does complain of itching.  She she does have residual redness around her lower legs. Past Medical History:  Diagnosis Date  . Contact dermatitis and other eczema, due to unspecified cause   . Cortical senile cataract   . GERD (gastroesophageal reflux disease) 02/17/2013  . Hyperparathyroidism, unspecified ([place]) 03/26/2010  . Irritable bowel syndrome   . Keratoconjunctivitis sicca, not specified as Sjogren's   . Loss of weight   . Macular degeneration (senile) of retina, unspecified   . Memory loss   . Other and unspecified hyperlipidemia   . Other malaise and fatigue  06/17/2010  . Pain in joint, site unspecified   . Pathologic fracture of vertebrae   . Reflux esophagitis   . Scoliosis (and kyphoscoliosis), idiopathic   . Senile osteoporosis   . Type II or unspecified type diabetes mellitus without mention of complication, uncontrolled   .  Unspecified essential hypertension   . Unspecified hereditary and idiopathic peripheral neuropathy   . Unspecified vitamin D deficiency    Past Surgical History:  Procedure Laterality Date  . APPENDECTOMY    . BREAST LUMPECTOMY  02/05/2010   Dr. Christie Beckers  . CATARACT EXTRACTION W/ INTRAOCULAR LENS  IMPLANT, BILATERAL  04/2013   Dr. Bing Plume  . CHOLECYSTECTOMY  2004   Dr. Zella Richer  . COLONOSCOPY  2001   Dr. Collene Mares  . fibroidectomy  1950  . [place]  . KYPHOPLASTY  2010   T10 & T12 Dr. Erline Levine  . MEDIAL PARTIAL KNEE REPLACEMENT Left 2004   Dr. Ronnie Derby  . SPINE SURGERY     vertebroplasty T10, T12  . SPINE SURGERY  11/02/2001    C4-5 & C5-6 Titanium Plate Placed Dr. Carloyn Manner  . THYROIDECTOMY, PARTIAL Left 2007  . TOTAL KNEE ARTHROPLASTY Right 2009   Dr. Alvan Dame     Allergies  Allergen Reactions  . Bactrim     unknown  . Betadine [Povidone Iodine]     unknown  . Metformin And Related     Hurt stomach  . Nutmeg Oil (Myristica Oil)     unknown  . Sulfa Antibiotics     unknown    Outpatient Encounter Medications as of 08/10/2018  Medication Sig  . amLODipine (NORVASC) 5 MG tablet Take 1 tablet (5 mg total) by mouth daily.  Marland Kitchen aspirin EC 81 MG EC tablet Take 1 tablet (81 mg total) by mouth daily.  . carboxymethylcellul-glycerin (OPTIVE) 0.5-0.9 % ophthalmic solution Place 2 drops into the left eye 3 (three) times daily as needed for dry eyes.  . furosemide (LASIX) 20 MG tablet Take 20 mg by mouth daily.  . Hydrocortisone Acetate 1 % CREA Apply 1 % topically daily.  Marland Kitchen losartan (COZAAR) 50 MG tablet TAKE 1 TABLET ONCE DAILY TO CONTROL BLOOD PRESSURE.  . metFORMIN (GLUCOPHAGE-XR) 500 MG 24 hr tablet Take 500 mg by mouth at bedtime.  . metoprolol tartrate (LOPRESSOR) 25 MG tablet Take 0.5 tablets (12.5 mg total) by mouth 2 (two) times daily.  . Multiple Vitamin (MULTIVITAMIN WITH MINERALS) TABS tablet Take 1 tablet by mouth daily.  . nitroGLYCERIN (NITROSTAT) 0.4 MG SL  tablet Place 0.4 mg under the tongue every 5 (five) minutes as needed for chest pain.  . [DISCONTINUED] linagliptin (TRADJENTA) 5 MG TABS tablet Take 1 tablet (5 mg total) by mouth daily. (Patient not taking: Reported on 07/30/2018)   No facility-administered encounter medications on file as of 08/10/2018.     Review of Systems  Constitutional: Negative.   HENT: Negative.   Respiratory: Negative.   Cardiovascular: Negative.   Gastrointestinal: Negative.   Genitourinary: Negative.   Musculoskeletal: Negative.   Skin: Positive for rash.  Neurological: Negative.   Psychiatric/Behavioral: Negative.     Immunization History  Administered Date(s) Administered  . Influenza Whole 03/30/2012, 04/13/2013, 04/04/2018  . Influenza-Unspecified 04/17/2014, 03/29/2015  . Pneumococcal Polysaccharide-23 06/30/2001  . Td 06/30/2001   Pertinent  Health Maintenance Due  Topic Date Due  . PNA vac Low Risk Adult (2 of 2 - PCV13) 06/30/2002  . FOOT EXAM  12/21/2015  . OPHTHALMOLOGY EXAM  04/02/2016  . HEMOGLOBIN A1C  11/09/2018  . INFLUENZA VACCINE  Completed  . DEXA SCAN  Completed   Fall Risk  11/24/2017 07/09/2017 11/01/2015 07/26/2015 06/21/2015  Falls in the past year? No No No No No   Functional Status Survey:    Vitals:   08/10/18 1038  BP: 132/68  Pulse: 75  Resp: 16  Temp: 98.3 F (36.8 C)  SpO2: 96%  Weight: 113 lb (51.3 kg)  Height: 5\' 3"  (1.6 m)   Body mass index is 20.02 kg/m. Physical Exam Vitals signs and nursing note reviewed.  Constitutional:      Appearance: Normal appearance.  HENT:     Head: Normocephalic.     Nose: Nose normal.     Mouth/Throat:     Mouth: Mucous membranes are moist.     Pharynx: Oropharynx is clear.  Eyes:     Pupils: Pupils are equal, round, and reactive to light.  Neck:     Musculoskeletal: Neck supple.  Cardiovascular:     Rate and Rhythm: Normal rate and regular rhythm.     Pulses: Normal pulses.     Heart sounds: Normal heart  sounds. No murmur.  Pulmonary:     Effort: Pulmonary effort is normal. No respiratory distress.     Breath sounds: Normal breath sounds. No wheezing.  Abdominal:     General: Abdomen is flat. Bowel sounds are normal. There is no distension.     Palpations: Abdomen is soft.     Tenderness: There is no abdominal tenderness.  Musculoskeletal:     Comments: Does have Mild swelling Bilateral Has some redness in her Extremities But not Infected  Skin:    General: Skin is warm.     Comments: Her rash is much Better and Mostly Resolved  Neurological:     General: No focal deficit present.     Mental Status: She is alert and oriented to person, place, and time.  Psychiatric:        Mood and Affect: Mood normal.        Thought Content: Thought content normal.        Judgment: Judgment normal.     Labs reviewed: Recent Labs    12/29/17 06/26/18 2343 06/27/18 1015  NA 138 135 138  K 4.1 4.0 4.0  CL 104 102 102  CO2 26 24 27   GLUCOSE  --  265* 243*  BUN 10 16 12   CREATININE 0.7 0.78 0.76  CALCIUM 10.7 10.2 10.5*   Recent Labs    11/19/17 12/29/17 06/26/18 2343  AST 21 18 23   ALT 16 15 16   ALKPHOS 65 74 54  BILITOT  --   --  0.6  PROT  --   --  5.7*  ALBUMIN 4.2 3.6 3.4*   Recent Labs    04/01/18 06/26/18 2343 06/28/18 0200  WBC 7.8 10.3 9.7  HGB 11.8* 11.3* 10.7*  HCT 35* 34.5* 32.4*  MCV  --  94.5 93.1  PLT 236 234 236   Lab Results  Component Value Date   TSH 0.45 08/13/2017   Lab Results  Component Value Date   HGBA1C 6.7 05/11/2018   Lab Results  Component Value Date   CHOL 169 06/27/2018   HDL 70 06/27/2018   LDLCALC 96 06/27/2018   TRIG 17 06/27/2018   CHOLHDL 2.4 06/27/2018    Significant Diagnostic Results in last 30 days:  No results found.  Assessment/Plan  Allergic  rash Patient was started on a  number of new medicines in the hospital.  I had  discontinued the statin, hydralazine and Tradjenta But she  broken into rash again.   her  rash is now better after course of Prednisone Will continue Triamcinolone PRN for Itching Claritin 10 mg QD Also Nurses will monitor one area on her Leg for Infection Dermatology Referral if she does not get better  NSTEMI (non-ST elevated myocardial infarction) Was managed conservatively She is on aspirin. On losartan  Also on low-dose of Lopressor And was recently started on Lasix by Cardiology Repeat Bmp is pending LE edema On Low dose of Lasix Cellulitis of right lower extremity Resolved with Doxycycline DM type 2 with diabetic peripheral neuropathy Tradjentawas discontinued for now due to rash Her Metformin was increased to 500 mg BID Once she stabilized will consider Tradjenta Essential hypertension BP controlled Hydralazine discontinued due to Rash Cognitive impairment Patient will need detail Cog Exam Once her rash resolves we will do a detailed exam and consider Aricept  Family/ staff Communication:   Labs/tests ordered:   Total time spent in this patient care encounter was 40_ minutes; greater than 50% of the visit spent counseling patient, reviewing records , Labs and coordinating care for problems addressed at this encounter.

## 2018-08-12 DIAGNOSIS — I10 Essential (primary) hypertension: Secondary | ICD-10-CM | POA: Diagnosis not present

## 2018-08-12 LAB — BASIC METABOLIC PANEL
BUN: 16 (ref 4–21)
Creatinine: 0.9 (ref <=1.1)
Glucose: 132
Potassium: 4.2 (ref 3.4–5.3)
Sodium: 139 (ref 137–147)

## 2018-08-12 LAB — CBC AND DIFFERENTIAL
HCT: 37 (ref 36–46)
Hemoglobin: 12.4 (ref 12.0–16.0)
Platelets: 284 (ref 150–399)
WBC: 9.3

## 2018-08-24 DIAGNOSIS — R21 Rash and other nonspecific skin eruption: Secondary | ICD-10-CM | POA: Diagnosis not present

## 2018-08-24 DIAGNOSIS — I2510 Atherosclerotic heart disease of native coronary artery without angina pectoris: Secondary | ICD-10-CM | POA: Diagnosis not present

## 2018-08-24 DIAGNOSIS — E119 Type 2 diabetes mellitus without complications: Secondary | ICD-10-CM | POA: Diagnosis not present

## 2018-08-24 DIAGNOSIS — I10 Essential (primary) hypertension: Secondary | ICD-10-CM | POA: Diagnosis not present

## 2018-08-24 DIAGNOSIS — L270 Generalized skin eruption due to drugs and medicaments taken internally: Secondary | ICD-10-CM | POA: Diagnosis not present

## 2018-09-09 DIAGNOSIS — L308 Other specified dermatitis: Secondary | ICD-10-CM | POA: Diagnosis not present

## 2018-09-09 DIAGNOSIS — R21 Rash and other nonspecific skin eruption: Secondary | ICD-10-CM | POA: Diagnosis not present

## 2018-09-10 ENCOUNTER — Non-Acute Institutional Stay: Payer: Medicare Other | Admitting: Nurse Practitioner

## 2018-09-10 ENCOUNTER — Encounter: Payer: Self-pay | Admitting: Nurse Practitioner

## 2018-09-10 DIAGNOSIS — I10 Essential (primary) hypertension: Secondary | ICD-10-CM | POA: Diagnosis not present

## 2018-09-10 DIAGNOSIS — L309 Dermatitis, unspecified: Secondary | ICD-10-CM | POA: Diagnosis not present

## 2018-09-10 DIAGNOSIS — E1142 Type 2 diabetes mellitus with diabetic polyneuropathy: Secondary | ICD-10-CM | POA: Diagnosis not present

## 2018-09-10 LAB — COMPLETE METABOLIC PANEL WITH GFR
Albumin: 3.7
Calcium: 10.7
Chloride: 106
EGFR (Non-African Amer.): 63
Globulin: 2.3
Total Protein: 6 g/dL

## 2018-09-10 LAB — BASIC METABOLIC PANEL
Calcium: 10.8
Carbon Dioxide, Total: 29
Chloride: 102
EGFR (Non-African Amer.): 54

## 2018-09-10 NOTE — Progress Notes (Signed)
Location:  [place] Room Number: 093 Place of Service:  ALF [phone]) Provider:  Marlana Latus  NP  Kanoe Wanner X, NP  Patient Care Team: Devion Chriscoe X, NP as PCP - General (Internal Medicine) [place] Kelvin Sennett X, NP as Nurse Practitioner (Nurse Practitioner) Jacelyn Pi, MD as Consulting Physician (Endocrinology) Calvert Cantor, MD as Consulting Physician (Ophthalmology) Vickey Huger, MD as Consulting Physician (Orthopedic Surgery) Suella Broad, MD as Consulting Physician (Physical Medicine and Rehabilitation) Erline Levine, MD as Consulting Physician (Neurosurgery) Juanita Craver, MD as Consulting Physician (Gastroenterology) Martinique, Peter M, MD as Consulting Physician (Cardiology) Megan Salon, MD as Consulting Physician (Gynecology)  Extended Emergency Contact Information Primary Emergency Contact: [name], [place] of [place] Phone: [phone] Relation: Friend Secondary Emergency Contact: Edmon Crape Mobile Phone: [phone] Relation: Other Preferred language: Cleophus Molt Interpreter needed? No Guardian: Meryl Dare Mobile Phone: [phone] Relation: Legal Guardian Preferred language: English Interpreter needed? No  Code Status:  DNR Goals of care: Advanced Directive information Advanced Directives 09/10/2018  Does Patient Have a Medical Advance Directive? Yes  Type of Advance Directive Living will;Out of facility DNR (pink MOST or yellow form)  Does patient want to make changes to medical advance directive? No - Patient declined  Copy of [place] in Chart? Yes - validated most recent copy scanned in chart (See row information)  Would patient like information on creating a medical advance directive? -  Pre-existing out of facility DNR order (yellow form or pink MOST form) Yellow form placed in chart (order not valid for inpatient use)     Chief Complaint   Patient presents with  . Medical Management of Chronic Issues    HPI:  Pt is a 85 y.o. female seen today for medical management of chronic diseases.    The patient resides in [place] for safety and care assistance. HTN, blood pressure is controlled on Metoprolol 12.5mg  bid, Losartan 50mg  qd. Furosemide 20mg  qd, Amlodipine 5mg  qd. T2DM, blood sugar is controlled on Metformin 500mg  qd. Dermatitis, s/p dermatology consultation, on Betamethasone ointment bid.    Past Medical History:  Diagnosis Date  . Contact dermatitis and other eczema, due to unspecified cause   . Cortical senile cataract   . GERD (gastroesophageal reflux disease) 02/17/2013  . Hyperparathyroidism, unspecified ([place] [place]) 03/26/2010  . Irritable bowel syndrome   . Keratoconjunctivitis sicca, not specified as Sjogren's   . Loss of weight   . Macular degeneration (senile) of retina, unspecified   . Memory loss   . Other and unspecified hyperlipidemia   . Other malaise and fatigue 06/17/2010  . Pain in joint, site unspecified   . Pathologic fracture of vertebrae   . Reflux esophagitis   . Scoliosis (and kyphoscoliosis), idiopathic   . Senile osteoporosis   . Type II or unspecified type diabetes mellitus without mention of complication, uncontrolled   . Unspecified essential hypertension   . Unspecified hereditary and idiopathic peripheral neuropathy   . Unspecified vitamin D deficiency    Past Surgical History:  Procedure Laterality Date  . APPENDECTOMY    . BREAST LUMPECTOMY  02/05/2010   Dr. Christie Beckers  . CATARACT EXTRACTION W/ INTRAOCULAR LENS  IMPLANT, BILATERAL  04/2013   Dr. Bing Plume  . CHOLECYSTECTOMY  2004   Dr. Zella Richer  . COLONOSCOPY  2001   Dr. Collene Mares  . fibroidectomy  1950  . [place]  .  KYPHOPLASTY  2010   T10 & T12 Dr. Erline Levine  . MEDIAL PARTIAL KNEE REPLACEMENT Left 2004   Dr. Ronnie Derby  . SPINE SURGERY     vertebroplasty T10, T12  . SPINE SURGERY  11/02/2001    C4-5 & C5-6 Titanium  Plate Placed Dr. Carloyn Manner  . THYROIDECTOMY, PARTIAL Left 2007  . TOTAL KNEE ARTHROPLASTY Right 2009   Dr. Alvan Dame     Allergies  Allergen Reactions  . Bactrim     unknown  . Betadine [Povidone Iodine]     unknown  . Metformin And Related     Hurt stomach  . Nutmeg Oil (Myristica Oil)     unknown  . Sulfa Antibiotics     unknown    Outpatient Encounter Medications as of 09/10/2018  Medication Sig  . amLODipine (NORVASC) 5 MG tablet Take 1 tablet (5 mg total) by mouth daily.  Marland Kitchen aspirin EC 81 MG EC tablet Take 1 tablet (81 mg total) by mouth daily.  Marland Kitchen augmented betamethasone dipropionate (DIPROLENE-AF) 0.05 % ointment Apply topically 2 (two) times daily.  . carboxymethylcellul-glycerin (OPTIVE) 0.5-0.9 % ophthalmic solution Place 2 drops into the left eye every 8 (eight) hours as needed for dry eyes.   . furosemide (LASIX) 20 MG tablet Take 20 mg by mouth daily.  Marland Kitchen losartan (COZAAR) 50 MG tablet TAKE 1 TABLET ONCE DAILY TO CONTROL BLOOD PRESSURE.  . metFORMIN (GLUCOPHAGE-XR) 500 MG 24 hr tablet Take 500 mg by mouth at bedtime.  . metoprolol tartrate (LOPRESSOR) 25 MG tablet Take 0.5 tablets (12.5 mg total) by mouth 2 (two) times daily.  . Multiple Vitamin (MULTIVITAMIN WITH MINERALS) TABS tablet Take 1 tablet by mouth daily.  . nitroGLYCERIN (NITROSTAT) 0.4 MG SL tablet Place 0.4 mg under the tongue every 5 (five) minutes as needed for chest pain.  . [DISCONTINUED] Hydrocortisone Acetate 1 % CREA Apply 1 % topically daily.   No facility-administered encounter medications on file as of 09/10/2018.    ROS was provided with assistance of staff.  Review of Systems  Constitutional: Negative for activity change, appetite change, chills, diaphoresis, fatigue, fever and unexpected weight change.  HENT: Positive for hearing loss. Negative for congestion and voice change.   Respiratory: Negative for cough, shortness of breath and wheezing.   Cardiovascular: Positive for leg swelling. Negative for  chest pain and palpitations.  Gastrointestinal: Negative for abdominal distention, abdominal pain, constipation, diarrhea, nausea and vomiting.  Genitourinary: Negative for difficulty urinating, dysuria and urgency.  Musculoskeletal: Positive for back pain and gait problem.  Skin: Positive for rash. Negative for color change.  Neurological: Negative for dizziness, facial asymmetry, speech difficulty, weakness, light-headedness, numbness and headaches.       Memory lapses.   Psychiatric/Behavioral: Negative for agitation, behavioral problems, hallucinations and sleep disturbance. The patient is not nervous/anxious.     Immunization History  Administered Date(s) Administered  . Influenza Whole 03/30/2012, 04/13/2013, 04/04/2018  . Influenza-Unspecified 04/17/2014, 03/29/2015  . Pneumococcal Polysaccharide-23 06/30/2001  . Td 06/30/2001   Pertinent  Health Maintenance Due  Topic Date Due  . PNA vac Low Risk Adult (2 of 2 - PCV13) 06/30/2002  . FOOT EXAM  12/21/2015  . OPHTHALMOLOGY EXAM  04/02/2016  . HEMOGLOBIN A1C  11/09/2018  . INFLUENZA VACCINE  Completed  . DEXA SCAN  Completed   Fall Risk  11/24/2017 07/09/2017 11/01/2015 07/26/2015 06/21/2015  Falls in the past year? No No No No No   Functional Status Survey:    Vitals:  09/10/18 1121  BP: 120/70  Pulse: 70  Resp: 18  Temp: 98.4 F (36.9 C)  SpO2: 95%  Weight: 109 lb 12.8 oz (49.8 kg)  Height: 5\' 3"  (1.6 m)   Body mass index is 19.45 kg/m. Physical Exam Vitals signs and nursing note reviewed.  Constitutional:      General: She is not in acute distress.    Appearance: Normal appearance. She is normal weight. She is not ill-appearing, toxic-appearing or diaphoretic.  HENT:     Head: Normocephalic and atraumatic.     Nose: Nose normal.     Mouth/Throat:     Mouth: Mucous membranes are moist.  Eyes:     Extraocular Movements: Extraocular movements intact.     Pupils: Pupils are equal, round, and reactive to light.   Neck:     Musculoskeletal: Normal range of motion and neck supple.  Cardiovascular:     Rate and Rhythm: Normal rate and regular rhythm.     Heart sounds: No murmur.  Pulmonary:     Effort: Pulmonary effort is normal.     Breath sounds: No wheezing, rhonchi or rales.  Abdominal:     General: There is no distension.     Palpations: Abdomen is soft.     Tenderness: There is no abdominal tenderness. There is no guarding or rebound.  Musculoskeletal:     Right lower leg: Edema present.     Left lower leg: Edema present.     Comments: Trace edema BLE  Skin:    General: Skin is warm and dry.     Findings: Rash present.     Comments: Near resolution. Raised scaly red patches R+L wrists, ankles, right lower quadrant abd. S/p biopsy by Dermatology.   Neurological:     General: No focal deficit present.     Mental Status: She is alert.     Cranial Nerves: No cranial nerve deficit.     Motor: No weakness.     Coordination: Coordination normal.     Gait: Gait abnormal.     Comments: Oriented to person and place. Ambulates with walker.   Psychiatric:        Mood and Affect: Mood normal.        Behavior: Behavior normal.        Thought Content: Thought content normal.        Judgment: Judgment normal.     Labs reviewed: Recent Labs    06/26/18 2343 06/27/18 1015 07/22/18 08/12/18  NA 135 138 139 139  K 4.0 4.0 4.2 4.2  CL 102 102 106 102  CO2 24 27  --  29  GLUCOSE 265* 243*  --   --   BUN 16 12 17 16   CREATININE 0.78 0.76 0.8 0.9  CALCIUM 10.2 10.5* 10.7 10.8   Recent Labs    12/29/17 06/26/18 2343 07/22/18  AST 18 23 15   ALT 15 16 12   ALKPHOS 74 54 62  BILITOT  --  0.6  --   PROT  --  5.7* 6.0  ALBUMIN 3.6 3.4* 3.7   Recent Labs    06/26/18 2343 06/28/18 0200 07/22/18 08/12/18  WBC 10.3 9.7 10.7 9.3  HGB 11.3* 10.7* 11.1* 12.4  HCT 34.5* 32.4* 33* 37  MCV 94.5 93.1  --   --   PLT 234 236 284 284   Lab Results  Component Value Date   TSH 0.45 08/13/2017    Lab Results  Component Value Date   HGBA1C  6.7 05/11/2018   Lab Results  Component Value Date   CHOL 169 06/27/2018   HDL 70 06/27/2018   LDLCALC 96 06/27/2018   TRIG 17 06/27/2018   CHOLHDL 2.4 06/27/2018    Significant Diagnostic Results in last 30 days:  No results found.  Assessment/Plan Essential hypertension Blood pressure is controlled, continue Metoprolol 12.5mg  bid, Losartan 50mg  qd, Furosemide 20mg  qd, Amlodipine 5mg  qd.   DM type 2 with diabetic peripheral neuropathy (HCC) Blood sugar is controlled, continue Metformin 500mg  qd, last Hgb a1c 6.7 05/11/18  Dermatitis Improving, continue Betamethasone bid to affected areas. D/y dermatology, last eval 08/24/18     Family/ staff Communication: plan of care reviewed with the patient and charge nurse.   Labs/tests ordered:  none  Time spend 25 minutes.

## 2018-09-12 NOTE — Assessment & Plan Note (Addendum)
Improving, continue Betamethasone bid to affected areas. D/y dermatology, last eval 08/24/18

## 2018-09-12 NOTE — Assessment & Plan Note (Signed)
Blood pressure is controlled, continue Metoprolol 12.5mg  bid, Losartan 50mg  qd, Furosemide 20mg  qd, Amlodipine 5mg  qd.

## 2018-09-12 NOTE — Assessment & Plan Note (Signed)
Blood sugar is controlled, continue Metformin 500mg  qd, last Hgb a1c 6.7 05/11/18

## 2018-09-13 ENCOUNTER — Encounter: Payer: Self-pay | Admitting: Nurse Practitioner

## 2018-10-11 ENCOUNTER — Encounter: Payer: Self-pay | Admitting: Nurse Practitioner

## 2018-10-11 ENCOUNTER — Non-Acute Institutional Stay: Payer: Medicare Other | Admitting: Nurse Practitioner

## 2018-10-11 DIAGNOSIS — I249 Acute ischemic heart disease, unspecified: Secondary | ICD-10-CM | POA: Diagnosis not present

## 2018-10-11 DIAGNOSIS — I10 Essential (primary) hypertension: Secondary | ICD-10-CM

## 2018-10-11 NOTE — Assessment & Plan Note (Signed)
blood pressure measurements. 130/58, 126/58 in am 2x/2weeks, otherwise 140-130/70-60. Dsp in 64s 6x/weeks. The patient stated she feels fine, denied dizziness, headaches, change of vision, chest pain/pressures, palpitation, nausea, vomiting or diarrhea. On Metoprolol 12.5mg  bid, Losartan 50mg  qd, Furosemide 20mg  qd, will decrease Amlodipine 2. 5mg  qd, hold for Sbp<110, Dbp,50. Monitor BP

## 2018-10-11 NOTE — Assessment & Plan Note (Signed)
Stable, continue blood pressure control, continue prn NTG.

## 2018-10-11 NOTE — Progress Notes (Signed)
Location:  [facility] [place] Room Number: 518 Place of Service:  ALF [phone]) Provider:  Marlana Latus  NP  Xavion Muscat X, NP  Patient Care Team: [place] Mokry X, NP as PCP - General (Internal Medicine) [place] Aerianna Losey X, NP as Nurse Practitioner (Nurse Practitioner) Jacelyn Pi, MD as Consulting Physician (Endocrinology) Calvert Cantor, MD as Consulting Physician (Ophthalmology) Vickey Huger, MD as Consulting Physician (Orthopedic Surgery) Suella Broad, MD as Consulting Physician (Physical Medicine and Rehabilitation) Erline Levine, MD as Consulting Physician (Neurosurgery) Juanita Craver, MD as Consulting Physician (Gastroenterology) Martinique, Peter M, MD as Consulting Physician (Cardiology) Megan Salon, MD as Consulting Physician (Gynecology)  Extended Emergency Contact Information Primary Emergency Contact: [name], [place] of [place] Phone: [phone] Relation: Friend Secondary Emergency Contact: Edmon Crape Mobile Phone: [phone] Relation: Other Preferred language: Cleophus Molt Interpreter needed? No Guardian: Meryl Dare Mobile Phone: [phone] Relation: Legal Guardian Preferred language: English Interpreter needed? No  Code Status:  DNR Goals of care: Advanced Directive information Advanced Directives 10/11/2018  Does Patient Have a Medical Advance Directive? Yes  Type of Advance Directive Living will;Out of facility DNR (pink MOST or yellow form)  Does patient want to make changes to medical advance directive? No - Patient declined  Copy of [place] in Chart? -  Would patient like information on creating a medical advance directive? -  Pre-existing out of facility DNR order (yellow form or pink MOST form) Yellow form placed in chart (order not valid for inpatient use)     Chief Complaint  Patient presents with  . Acute Visit    C/o - elevated BP    HPI:  Pt  is a 85 y.o. female seen today for an acute visit for reported low blood pressure measurements. 130/58, 126/58 in am 2x/2weeks, otherwise 140-130/70-60. Dsp in 35s 6x/weeks. The patient stated she feels fine, denied dizziness, headaches, change of vision, chest pain/pressures, palpitation, nausea, vomiting or diarrhea. On Metoprolol 12.5mg  bid, Losartan 50mg  qd, Furosemide 20mg  qd, Amlodipine. 5mg  qd. Hx of acute coronary syndrome, no angina since last visited, prn NTG available to her.    Past Medical History:  Diagnosis Date  . Contact dermatitis and other eczema, due to unspecified cause   . Cortical senile cataract   . GERD (gastroesophageal reflux disease) 02/17/2013  . Hyperparathyroidism, unspecified ([place]) 03/26/2010  . Irritable bowel syndrome   . Keratoconjunctivitis sicca, not specified as Sjogren's   . Loss of weight   . Macular degeneration (senile) of retina, unspecified   . Memory loss   . Other and unspecified hyperlipidemia   . Other malaise and fatigue 06/17/2010  . Pain in joint, site unspecified   . Pathologic fracture of vertebrae   . Reflux esophagitis   . Scoliosis (and kyphoscoliosis), idiopathic   . Senile osteoporosis   . Type II or unspecified type diabetes mellitus without mention of complication, uncontrolled   . Unspecified essential hypertension   . Unspecified hereditary and idiopathic peripheral neuropathy   . Unspecified vitamin D deficiency    Past Surgical History:  Procedure Laterality Date  . APPENDECTOMY    . BREAST LUMPECTOMY  02/05/2010   Dr. Christie Beckers  . CATARACT EXTRACTION W/ INTRAOCULAR LENS  IMPLANT, BILATERAL  04/2013   Dr. Bing Plume  . CHOLECYSTECTOMY  2004   Dr. Zella Richer  . COLONOSCOPY  2001   Dr. Collene Mares  . fibroidectomy  1950  .  [place]  . KYPHOPLASTY  2010   T10 & T12 Dr. Erline Levine  . MEDIAL PARTIAL KNEE REPLACEMENT Left 2004   Dr. Ronnie Derby  . SPINE SURGERY     vertebroplasty T10, T12  . SPINE SURGERY  11/02/2001     C4-5 & C5-6 Titanium Plate Placed Dr. Carloyn Manner  . THYROIDECTOMY, PARTIAL Left 2007  . TOTAL KNEE ARTHROPLASTY Right 2009   Dr. Alvan Dame     Allergies  Allergen Reactions  . Bactrim     unknown  . Betadine [Povidone Iodine]     unknown  . Metformin And Related     Hurt stomach  . Nutmeg Oil (Myristica Oil)     unknown  . Sulfa Antibiotics     unknown    Outpatient Encounter Medications as of 10/11/2018  Medication Sig  . amLODipine (NORVASC) 5 MG tablet Take 1 tablet (5 mg total) by mouth daily.  Marland Kitchen aspirin EC 81 MG EC tablet Take 1 tablet (81 mg total) by mouth daily.  Marland Kitchen augmented betamethasone dipropionate (DIPROLENE-AF) 0.05 % ointment Apply topically 2 (two) times daily.  . carboxymethylcellul-glycerin (OPTIVE) 0.5-0.9 % ophthalmic solution Place 2 drops into the left eye every 8 (eight) hours as needed for dry eyes.   . furosemide (LASIX) 20 MG tablet Take 20 mg by mouth daily.  Marland Kitchen losartan (COZAAR) 50 MG tablet TAKE 1 TABLET ONCE DAILY TO CONTROL BLOOD PRESSURE.  . metFORMIN (GLUCOPHAGE-XR) 500 MG 24 hr tablet Take 500 mg by mouth 2 (two) times daily.   . metoprolol tartrate (LOPRESSOR) 25 MG tablet Take 0.5 tablets (12.5 mg total) by mouth 2 (two) times daily.  . Multiple Vitamin (MULTIVITAMIN WITH MINERALS) TABS tablet Take 1 tablet by mouth daily.  . nitroGLYCERIN (NITROSTAT) 0.4 MG SL tablet Place 0.4 mg under the tongue every 5 (five) minutes as needed for chest pain.   No facility-administered encounter medications on file as of 10/11/2018.    ROS was provided with assistance of staff.  Review of Systems  Constitutional: Negative for activity change, appetite change, chills, diaphoresis, fatigue and fever.  HENT: Positive for hearing loss. Negative for congestion and voice change.   Respiratory: Negative for cough, shortness of breath and wheezing.   Cardiovascular: Positive for leg swelling. Negative for chest pain and palpitations.  Gastrointestinal: Negative for abdominal  distention, abdominal pain, constipation, diarrhea, nausea and vomiting.  Genitourinary: Negative for difficulty urinating, dysuria and urgency.  Musculoskeletal: Positive for gait problem.  Skin: Negative for color change and pallor.  Neurological: Negative for dizziness, speech difficulty, weakness and headaches.       Memory lapses.   Psychiatric/Behavioral: Negative for agitation, behavioral problems, hallucinations and sleep disturbance. The patient is not nervous/anxious.     Immunization History  Administered Date(s) Administered  . Influenza Whole 03/30/2012, 04/13/2013, 04/04/2018  . Influenza-Unspecified 04/17/2014, 03/29/2015  . Pneumococcal Polysaccharide-23 06/30/2001  . Td 06/30/2001   Pertinent  Health Maintenance Due  Topic Date Due  . PNA vac Low Risk Adult (2 of 2 - PCV13) 06/30/2002  . FOOT EXAM  12/21/2015  . OPHTHALMOLOGY EXAM  04/02/2016  . HEMOGLOBIN A1C  11/09/2018  . INFLUENZA VACCINE  01/29/2019  . DEXA SCAN  Completed   Fall Risk  11/24/2017 07/09/2017 11/01/2015 07/26/2015 06/21/2015  Falls in the past year? No No No No No   Functional Status Survey:    Vitals:   10/11/18 1132  BP: 140/68  Pulse: 72  Resp: 18  Temp: 98.2  F (36.8 C)  SpO2: 97%  Weight: 111 lb 9.6 oz (50.6 kg)  Height: 5\' 3"  (1.6 m)   Body mass index is 19.77 kg/m. Physical Exam Constitutional:      General: She is not in acute distress.    Appearance: Normal appearance. She is not ill-appearing, toxic-appearing or diaphoretic.  HENT:     Head: Normocephalic and atraumatic.     Nose: Nose normal.     Mouth/Throat:     Mouth: Mucous membranes are moist.  Eyes:     Extraocular Movements: Extraocular movements intact.     Pupils: Pupils are equal, round, and reactive to light.  Neck:     Musculoskeletal: Normal range of motion and neck supple.  Cardiovascular:     Rate and Rhythm: Normal rate and regular rhythm.     Heart sounds: No murmur.  Pulmonary:     Effort:  Pulmonary effort is normal.     Breath sounds: No wheezing, rhonchi or rales.  Abdominal:     General: There is no distension.     Palpations: Abdomen is soft.     Tenderness: There is no abdominal tenderness. There is no guarding or rebound.  Musculoskeletal:     Right lower leg: Edema present.     Left lower leg: Edema present.     Comments: Trace edema BLE  Skin:    General: Skin is warm and dry.  Neurological:     General: No focal deficit present.     Mental Status: She is alert. Mental status is at baseline.     Comments: Oriented to person and place.   Psychiatric:        Mood and Affect: Mood normal.        Behavior: Behavior normal.     Labs reviewed: Recent Labs    06/26/18 2343 06/27/18 1015 07/22/18 08/12/18  NA 135 138 139 139  K 4.0 4.0 4.2 4.2  CL 102 102 106 102  CO2 24 27  --  29  GLUCOSE 265* 243*  --   --   BUN 16 12 17 16   CREATININE 0.78 0.76 0.8 0.9  CALCIUM 10.2 10.5* 10.7 10.8   Recent Labs    12/29/17 06/26/18 2343 07/22/18  AST 18 23 15   ALT 15 16 12   ALKPHOS 74 54 62  BILITOT  --  0.6  --   PROT  --  5.7* 6.0  ALBUMIN 3.6 3.4* 3.7   Recent Labs    06/26/18 2343 06/28/18 0200 07/22/18 08/12/18  WBC 10.3 9.7 10.7 9.3  HGB 11.3* 10.7* 11.1* 12.4  HCT 34.5* 32.4* 33* 37  MCV 94.5 93.1  --   --   PLT 234 236 284 284   Lab Results  Component Value Date   TSH 0.45 08/13/2017   Lab Results  Component Value Date   HGBA1C 6.7 05/11/2018   Lab Results  Component Value Date   CHOL 169 06/27/2018   HDL 70 06/27/2018   LDLCALC 96 06/27/2018   TRIG 17 06/27/2018   CHOLHDL 2.4 06/27/2018    Significant Diagnostic Results in last 30 days:  No results found.  Assessment/Plan Essential hypertension blood pressure measurements. 130/58, 126/58 in am 2x/2weeks, otherwise 140-130/70-60. Dsp in 50s 6x/weeks. The patient stated she feels fine, denied dizziness, headaches, change of vision, chest pain/pressures, palpitation, nausea,  vomiting or diarrhea. On Metoprolol 12.5mg  bid, Losartan 50mg  qd, Furosemide 20mg  qd, will decrease Amlodipine 2. 5mg  qd, hold for Sbp<110, Dbp,50.  Monitor BP  Acute coronary syndrome (HCC) Stable, continue blood pressure control, continue prn NTG.      Family/ staff Communication: plan of care reviewed with the patient and charge nurse.   Labs/tests ordered:  none  Time spend 25 minutes.

## 2018-11-10 ENCOUNTER — Non-Acute Institutional Stay: Payer: Medicare Other | Admitting: Nurse Practitioner

## 2018-11-10 ENCOUNTER — Encounter: Payer: Self-pay | Admitting: Nurse Practitioner

## 2018-11-10 DIAGNOSIS — I10 Essential (primary) hypertension: Secondary | ICD-10-CM | POA: Diagnosis not present

## 2018-11-10 DIAGNOSIS — R4189 Other symptoms and signs involving cognitive functions and awareness: Secondary | ICD-10-CM

## 2018-11-10 DIAGNOSIS — E1142 Type 2 diabetes mellitus with diabetic polyneuropathy: Secondary | ICD-10-CM | POA: Diagnosis not present

## 2018-11-10 NOTE — Assessment & Plan Note (Signed)
blood pressure is controlled, continue Amlodipine 5mg  qd, Metoprolol 12.5mg  bid, Losartan 50mg  qd, Furosmeide 20mg  qd.

## 2018-11-10 NOTE — Progress Notes (Signed)
Location:  [place] Room Number: [place] of Service:  ALF [phone]) Provider:  Kiersten Coss X Triston Lisanti, NP  Makoa Satz X, NP  Patient Care Team: Calynn Ferrero X, NP as PCP - General (Internal Medicine) [place] Jonita Hirota X, NP as Nurse Practitioner (Nurse Practitioner) Jacelyn Pi, MD as Consulting Physician (Endocrinology) Calvert Cantor, MD as Consulting Physician (Ophthalmology) Vickey Huger, MD as Consulting Physician (Orthopedic Surgery) Suella Broad, MD as Consulting Physician (Physical Medicine and Rehabilitation) Erline Levine, MD as Consulting Physician (Neurosurgery) Juanita Craver, MD as Consulting Physician (Gastroenterology) Martinique, Peter M, MD as Consulting Physician (Cardiology) Megan Salon, MD as Consulting Physician (Gynecology)  Extended Emergency Contact Information Primary Emergency Contact: [name], [place] of [place] Phone: [phone] Relation: Friend Secondary Emergency Contact: Edmon Crape Mobile Phone: [phone] Relation: Other Preferred language: Cleophus Molt Interpreter needed? No Guardian: Meryl Dare Mobile Phone: [phone] Relation: Legal Guardian Preferred language: English Interpreter needed? No  Code Status:  DNR Goals of care: Advanced Directive information Advanced Directives 11/10/2018  Does Patient Have a Medical Advance Directive? Yes  Type of Advance Directive Living will;Out of facility DNR (pink MOST or yellow form)  Does patient want to make changes to medical advance directive? No - Patient declined  Copy of [place] in Chart? -  Would patient like information on creating a medical advance directive? -  Pre-existing out of facility DNR order (yellow form or pink MOST form) Yellow form placed in chart (order not valid for inpatient use)     Chief Complaint  Patient presents with  . Medical Management of Chronic Issues    Routine  Visit    HPI:  Pt is a 85 y.o. female seen today for medical management of chronic diseases.    The patient has history of HTN, blood pressure is controlled on Amlodipine 5mg  qd, Metoprolol 12.5mg  bid, Losartan 50mg  qd, Furosmeide 20mg  qd. T2DM, blood sugar is controlled on Metformin 500mg  bid, last Hgb a1c 6.7 05/11/18.  She is functioning well in AL, [facility], ambulates with walker.    Past Medical History:  Diagnosis Date  . Contact dermatitis and other eczema, due to unspecified cause   . Cortical senile cataract   . GERD (gastroesophageal reflux disease) 02/17/2013  . Hyperparathyroidism, unspecified ([place]) 03/26/2010  . Irritable bowel syndrome   . Keratoconjunctivitis sicca, not specified as Sjogren's   . Loss of weight   . Macular degeneration (senile) of retina, unspecified   . Memory loss   . Other and unspecified hyperlipidemia   . Other malaise and fatigue 06/17/2010  . Pain in joint, site unspecified   . Pathologic fracture of vertebrae   . Reflux esophagitis   . Scoliosis (and kyphoscoliosis), idiopathic   . Senile osteoporosis   . Type II or unspecified type diabetes mellitus without mention of complication, uncontrolled   . Unspecified essential hypertension   . Unspecified hereditary and idiopathic peripheral neuropathy   . Unspecified vitamin D deficiency    Past Surgical History:  Procedure Laterality Date  . APPENDECTOMY    . BREAST LUMPECTOMY  02/05/2010   Dr. Christie Beckers  . CATARACT EXTRACTION W/ INTRAOCULAR LENS  IMPLANT, BILATERAL  04/2013   Dr. Bing Plume  . CHOLECYSTECTOMY  2004   Dr. Zella Richer  . COLONOSCOPY  2001   Dr. Collene Mares  . fibroidectomy  1950  . [place]  . KYPHOPLASTY  2010  T10 & T12 Dr. Erline Levine  . MEDIAL PARTIAL KNEE REPLACEMENT Left 2004   Dr. Ronnie Derby  . SPINE SURGERY     vertebroplasty T10, T12  . SPINE SURGERY  11/02/2001    C4-5 & C5-6 Titanium Plate Placed Dr. Carloyn Manner  . THYROIDECTOMY, PARTIAL Left 2007  . TOTAL KNEE  ARTHROPLASTY Right 2009   Dr. Alvan Dame     Allergies  Allergen Reactions  . Bactrim     unknown  . Betadine [Povidone Iodine]     unknown  . Metformin And Related     Hurt stomach  . Nutmeg Oil (Myristica Oil)     unknown  . Sulfa Antibiotics     unknown    Outpatient Encounter Medications as of 11/10/2018  Medication Sig  . amLODipine (NORVASC) 5 MG tablet Take 1 tablet (5 mg total) by mouth daily.  Marland Kitchen aspirin EC 81 MG EC tablet Take 1 tablet (81 mg total) by mouth daily.  Marland Kitchen augmented betamethasone dipropionate (DIPROLENE-AF) 0.05 % ointment Apply topically 2 (two) times daily. Apply  to active rash on extremities . 1 app immediately after shower.  . carboxymethylcellul-glycerin (OPTIVE) 0.5-0.9 % ophthalmic solution Place 2 drops into the left eye every 8 (eight) hours as needed for dry eyes.   . furosemide (LASIX) 20 MG tablet Take 20 mg by mouth See admin instructions. every Monday, Wednesday, Friday  . losartan (COZAAR) 50 MG tablet TAKE 1 TABLET ONCE DAILY TO CONTROL BLOOD PRESSURE.  . metFORMIN (GLUCOPHAGE-XR) 500 MG 24 hr tablet Take 500 mg by mouth 2 (two) times daily.   . metoprolol tartrate (LOPRESSOR) 25 MG tablet Take 12.5 mg by mouth 2 (two) times daily. Hold Metoprolol for SBP < 110 and DSP <50  . Multiple Vitamins-Minerals (MULTIVITAMINS THER. W/MINERALS) TABS tablet Take 1 tablet by mouth daily. 400 mcg tablet  . nitroGLYCERIN (NITROSTAT) 0.4 MG SL tablet Place 0.4 mg under the tongue every 5 (five) minutes as needed for chest pain.  . [DISCONTINUED] metoprolol tartrate (LOPRESSOR) 25 MG tablet Take 0.5 tablets (12.5 mg total) by mouth 2 (two) times daily.  . [DISCONTINUED] Multiple Vitamin (MULTIVITAMIN WITH MINERALS) TABS tablet Take 1 tablet by mouth daily.   No facility-administered encounter medications on file as of 11/10/2018.   ROS was provided with assistance of staff.  Review of Systems  Constitutional: Negative for activity change, appetite change, chills,  diaphoresis, fatigue, fever and unexpected weight change.  HENT: Positive for hearing loss. Negative for congestion and voice change.   Eyes: Negative for visual disturbance.  Respiratory: Negative for cough, shortness of breath and wheezing.   Cardiovascular: Positive for leg swelling. Negative for chest pain and palpitations.  Gastrointestinal: Negative for abdominal distention, abdominal pain, constipation, diarrhea, nausea and vomiting.  Genitourinary: Negative for difficulty urinating, dysuria and urgency.  Musculoskeletal: Positive for back pain and gait problem.  Skin: Positive for rash. Negative for color change and pallor.       Scaly patches ulnar aspect of R+L wrist, small scaly spots lateral R+L thighs, not pruritic.   Neurological: Negative for dizziness, speech difficulty, weakness and headaches.       Memory lapses.   Psychiatric/Behavioral: Negative for agitation, behavioral problems, hallucinations and sleep disturbance. The patient is not nervous/anxious.     Immunization History  Administered Date(s) Administered  . Influenza Whole 03/30/2012, 04/13/2013, 04/04/2018  . Influenza-Unspecified 04/17/2014, 03/29/2015  . Pneumococcal Polysaccharide-23 06/30/2001  . Td 06/30/2001   Pertinent  Health Maintenance Due  Topic Date  Due  . PNA vac Low Risk Adult (2 of 2 - PCV13) 06/30/2002  . FOOT EXAM  12/21/2015  . OPHTHALMOLOGY EXAM  04/02/2016  . HEMOGLOBIN A1C  11/09/2018  . INFLUENZA VACCINE  01/29/2019  . DEXA SCAN  Completed   Fall Risk  11/24/2017 07/09/2017 11/01/2015 07/26/2015 06/21/2015  Falls in the past year? No No No No No   Functional Status Survey:    Vitals:   11/10/18 0819  BP: 130/60  Pulse: 70  Resp: 18  Temp: 98.1 F (36.7 C)  TempSrc: Oral  SpO2: 98%  Weight: 109 lb 6.4 oz (49.6 kg)  Height: 5\' 3"  (1.6 m)   Body mass index is 19.38 kg/m. Physical Exam Vitals signs and nursing note reviewed.  Constitutional:      General: She is not in  acute distress.    Appearance: Normal appearance. She is normal weight. She is not ill-appearing, toxic-appearing or diaphoretic.  HENT:     Head: Normocephalic and atraumatic.     Nose: Nose normal.     Mouth/Throat:     Mouth: Mucous membranes are moist.  Eyes:     Extraocular Movements: Extraocular movements intact.     Pupils: Pupils are equal, round, and reactive to light.  Neck:     Musculoskeletal: Normal range of motion and neck supple.  Cardiovascular:     Rate and Rhythm: Normal rate and regular rhythm.     Heart sounds: No murmur.  Pulmonary:     Effort: Pulmonary effort is normal.     Breath sounds: No wheezing or rhonchi.  Abdominal:     General: There is no distension.     Palpations: Abdomen is soft.     Tenderness: There is no abdominal tenderness. There is no right CVA tenderness, left CVA tenderness, guarding or rebound.  Musculoskeletal:     Right lower leg: Edema present.     Left lower leg: Edema present.     Comments: Race edema BLE. Ambulates with walker. kyphoscoliosis   Skin:    General: Skin is warm and dry.     Findings: Rash present.     Comments: Scaly patches ulnar aspect of R+L wrist, small scaly spots lateral R+L thighs, not pruritic. F/u dermatology   Neurological:     General: No focal deficit present.     Mental Status: She is alert. Mental status is at baseline.     Cranial Nerves: No cranial nerve deficit.     Motor: No weakness.     Coordination: Coordination normal.     Gait: Gait abnormal.     Deep Tendon Reflexes: Reflexes normal.     Comments: Oriented to person and place.   Psychiatric:        Mood and Affect: Mood normal.        Behavior: Behavior normal.        Thought Content: Thought content normal.        Judgment: Judgment normal.     Labs reviewed: Recent Labs    06/26/18 2343 06/27/18 1015 07/22/18 08/12/18  NA 135 138 139 139  K 4.0 4.0 4.2 4.2  CL 102 102 106 102  CO2 24 27  --  29  GLUCOSE 265* 243*  --   --    BUN 16 12 17 16   CREATININE 0.78 0.76 0.8 0.9  CALCIUM 10.2 10.5* 10.7 10.8   Recent Labs    12/29/17 06/26/18 2343 07/22/18  AST 18 23 15   ALT  15 16 12   ALKPHOS 74 54 62  BILITOT  --  0.6  --   PROT  --  5.7* 6.0  ALBUMIN 3.6 3.4* 3.7   Recent Labs    06/26/18 2343 06/28/18 0200 07/22/18 08/12/18  WBC 10.3 9.7 10.7 9.3  HGB 11.3* 10.7* 11.1* 12.4  HCT 34.5* 32.4* 33* 37  MCV 94.5 93.1  --   --   PLT 234 236 284 284   Lab Results  Component Value Date   TSH 0.45 08/13/2017   Lab Results  Component Value Date   HGBA1C 6.7 05/11/2018   Lab Results  Component Value Date   CHOL 169 06/27/2018   HDL 70 06/27/2018   LDLCALC 96 06/27/2018   TRIG 17 06/27/2018   CHOLHDL 2.4 06/27/2018    Significant Diagnostic Results in last 30 days:   Assessment/Plan Essential hypertension blood pressure is controlled, continue Amlodipine 5mg  qd, Metoprolol 12.5mg  bid, Losartan 50mg  qd, Furosmeide 20mg  qd.   DM type 2 with diabetic peripheral neuropathy (HCC) blood sugar is controlled on Metformin 500mg  bid, last Hgb a1c 6.7 05/11/18  Cognitive impairment Functioning well in [facility], continue to ambulates with walker.       Family/ staff Communication: plan of care reviewed with the patient and charge nruse.   Labs/tests ordered:  none  Time spend 40 minutes.

## 2018-11-10 NOTE — Assessment & Plan Note (Signed)
Functioning well in [facility], continue to ambulates with walker.

## 2018-11-10 NOTE — Assessment & Plan Note (Signed)
blood sugar is controlled on Metformin 500mg  bid, last Hgb a1c 6.7 05/11/18

## 2018-11-24 DIAGNOSIS — M419 Scoliosis, unspecified: Secondary | ICD-10-CM | POA: Diagnosis not present

## 2018-11-24 DIAGNOSIS — R293 Abnormal posture: Secondary | ICD-10-CM | POA: Diagnosis not present

## 2018-11-24 DIAGNOSIS — R413 Other amnesia: Secondary | ICD-10-CM | POA: Diagnosis not present

## 2018-11-24 DIAGNOSIS — K582 Mixed irritable bowel syndrome: Secondary | ICD-10-CM | POA: Diagnosis not present

## 2018-11-24 DIAGNOSIS — I10 Essential (primary) hypertension: Secondary | ICD-10-CM | POA: Diagnosis not present

## 2018-11-24 DIAGNOSIS — K219 Gastro-esophageal reflux disease without esophagitis: Secondary | ICD-10-CM | POA: Diagnosis not present

## 2018-11-24 DIAGNOSIS — M6281 Muscle weakness (generalized): Secondary | ICD-10-CM | POA: Diagnosis not present

## 2018-11-24 DIAGNOSIS — R32 Unspecified urinary incontinence: Secondary | ICD-10-CM | POA: Diagnosis not present

## 2018-11-24 DIAGNOSIS — H3530 Unspecified macular degeneration: Secondary | ICD-10-CM | POA: Diagnosis not present

## 2018-11-24 DIAGNOSIS — M859 Disorder of bone density and structure, unspecified: Secondary | ICD-10-CM | POA: Diagnosis not present

## 2018-11-24 DIAGNOSIS — E0590 Thyrotoxicosis, unspecified without thyrotoxic crisis or storm: Secondary | ICD-10-CM | POA: Diagnosis not present

## 2018-11-24 DIAGNOSIS — M810 Age-related osteoporosis without current pathological fracture: Secondary | ICD-10-CM | POA: Diagnosis not present

## 2018-11-24 DIAGNOSIS — G609 Hereditary and idiopathic neuropathy, unspecified: Secondary | ICD-10-CM | POA: Diagnosis not present

## 2018-11-24 DIAGNOSIS — M65331 Trigger finger, right middle finger: Secondary | ICD-10-CM | POA: Diagnosis not present

## 2018-11-24 DIAGNOSIS — M549 Dorsalgia, unspecified: Secondary | ICD-10-CM | POA: Diagnosis not present

## 2018-11-24 DIAGNOSIS — M67419 Ganglion, unspecified shoulder: Secondary | ICD-10-CM | POA: Diagnosis not present

## 2018-11-24 DIAGNOSIS — R29898 Other symptoms and signs involving the musculoskeletal system: Secondary | ICD-10-CM | POA: Diagnosis not present

## 2018-11-24 DIAGNOSIS — M545 Low back pain: Secondary | ICD-10-CM | POA: Diagnosis not present

## 2018-11-24 DIAGNOSIS — R29818 Other symptoms and signs involving the nervous system: Secondary | ICD-10-CM | POA: Diagnosis not present

## 2018-11-24 DIAGNOSIS — H25019 Cortical age-related cataract, unspecified eye: Secondary | ICD-10-CM | POA: Diagnosis not present

## 2018-11-24 DIAGNOSIS — E118 Type 2 diabetes mellitus with unspecified complications: Secondary | ICD-10-CM | POA: Diagnosis not present

## 2018-11-24 DIAGNOSIS — M79644 Pain in right finger(s): Secondary | ICD-10-CM | POA: Diagnosis not present

## 2018-11-24 DIAGNOSIS — M5186 Other intervertebral disc disorders, lumbar region: Secondary | ICD-10-CM | POA: Diagnosis not present

## 2018-11-25 DIAGNOSIS — M6281 Muscle weakness (generalized): Secondary | ICD-10-CM | POA: Diagnosis not present

## 2018-11-25 DIAGNOSIS — I10 Essential (primary) hypertension: Secondary | ICD-10-CM | POA: Diagnosis not present

## 2018-11-25 DIAGNOSIS — G609 Hereditary and idiopathic neuropathy, unspecified: Secondary | ICD-10-CM | POA: Diagnosis not present

## 2018-11-25 DIAGNOSIS — M79644 Pain in right finger(s): Secondary | ICD-10-CM | POA: Diagnosis not present

## 2018-11-25 DIAGNOSIS — H25019 Cortical age-related cataract, unspecified eye: Secondary | ICD-10-CM | POA: Diagnosis not present

## 2018-11-25 DIAGNOSIS — M65331 Trigger finger, right middle finger: Secondary | ICD-10-CM | POA: Diagnosis not present

## 2018-11-26 DIAGNOSIS — M6281 Muscle weakness (generalized): Secondary | ICD-10-CM | POA: Diagnosis not present

## 2018-11-26 DIAGNOSIS — H25019 Cortical age-related cataract, unspecified eye: Secondary | ICD-10-CM | POA: Diagnosis not present

## 2018-11-26 DIAGNOSIS — M65331 Trigger finger, right middle finger: Secondary | ICD-10-CM | POA: Diagnosis not present

## 2018-11-26 DIAGNOSIS — I10 Essential (primary) hypertension: Secondary | ICD-10-CM | POA: Diagnosis not present

## 2018-11-26 DIAGNOSIS — G609 Hereditary and idiopathic neuropathy, unspecified: Secondary | ICD-10-CM | POA: Diagnosis not present

## 2018-11-26 DIAGNOSIS — M79644 Pain in right finger(s): Secondary | ICD-10-CM | POA: Diagnosis not present

## 2018-11-29 DIAGNOSIS — R29818 Other symptoms and signs involving the nervous system: Secondary | ICD-10-CM | POA: Diagnosis not present

## 2018-11-29 DIAGNOSIS — R293 Abnormal posture: Secondary | ICD-10-CM | POA: Diagnosis not present

## 2018-11-29 DIAGNOSIS — M65331 Trigger finger, right middle finger: Secondary | ICD-10-CM | POA: Diagnosis not present

## 2018-11-29 DIAGNOSIS — R32 Unspecified urinary incontinence: Secondary | ICD-10-CM | POA: Diagnosis not present

## 2018-11-29 DIAGNOSIS — R413 Other amnesia: Secondary | ICD-10-CM | POA: Diagnosis not present

## 2018-11-29 DIAGNOSIS — M79644 Pain in right finger(s): Secondary | ICD-10-CM | POA: Diagnosis not present

## 2018-11-29 DIAGNOSIS — E0590 Thyrotoxicosis, unspecified without thyrotoxic crisis or storm: Secondary | ICD-10-CM | POA: Diagnosis not present

## 2018-11-29 DIAGNOSIS — M6281 Muscle weakness (generalized): Secondary | ICD-10-CM | POA: Diagnosis not present

## 2018-11-29 DIAGNOSIS — I10 Essential (primary) hypertension: Secondary | ICD-10-CM | POA: Diagnosis not present

## 2018-11-29 DIAGNOSIS — M859 Disorder of bone density and structure, unspecified: Secondary | ICD-10-CM | POA: Diagnosis not present

## 2018-11-29 DIAGNOSIS — R29898 Other symptoms and signs involving the musculoskeletal system: Secondary | ICD-10-CM | POA: Diagnosis not present

## 2018-11-30 DIAGNOSIS — M65331 Trigger finger, right middle finger: Secondary | ICD-10-CM | POA: Diagnosis not present

## 2018-11-30 DIAGNOSIS — M859 Disorder of bone density and structure, unspecified: Secondary | ICD-10-CM | POA: Diagnosis not present

## 2018-11-30 DIAGNOSIS — M79644 Pain in right finger(s): Secondary | ICD-10-CM | POA: Diagnosis not present

## 2018-11-30 DIAGNOSIS — I10 Essential (primary) hypertension: Secondary | ICD-10-CM | POA: Diagnosis not present

## 2018-11-30 DIAGNOSIS — M6281 Muscle weakness (generalized): Secondary | ICD-10-CM | POA: Diagnosis not present

## 2018-11-30 DIAGNOSIS — E0590 Thyrotoxicosis, unspecified without thyrotoxic crisis or storm: Secondary | ICD-10-CM | POA: Diagnosis not present

## 2018-12-01 DIAGNOSIS — M859 Disorder of bone density and structure, unspecified: Secondary | ICD-10-CM | POA: Diagnosis not present

## 2018-12-01 DIAGNOSIS — E0590 Thyrotoxicosis, unspecified without thyrotoxic crisis or storm: Secondary | ICD-10-CM | POA: Diagnosis not present

## 2018-12-01 DIAGNOSIS — M79644 Pain in right finger(s): Secondary | ICD-10-CM | POA: Diagnosis not present

## 2018-12-01 DIAGNOSIS — M6281 Muscle weakness (generalized): Secondary | ICD-10-CM | POA: Diagnosis not present

## 2018-12-01 DIAGNOSIS — I10 Essential (primary) hypertension: Secondary | ICD-10-CM | POA: Diagnosis not present

## 2018-12-01 DIAGNOSIS — M65331 Trigger finger, right middle finger: Secondary | ICD-10-CM | POA: Diagnosis not present

## 2018-12-02 DIAGNOSIS — E0590 Thyrotoxicosis, unspecified without thyrotoxic crisis or storm: Secondary | ICD-10-CM | POA: Diagnosis not present

## 2018-12-02 DIAGNOSIS — I10 Essential (primary) hypertension: Secondary | ICD-10-CM | POA: Diagnosis not present

## 2018-12-02 DIAGNOSIS — M6281 Muscle weakness (generalized): Secondary | ICD-10-CM | POA: Diagnosis not present

## 2018-12-02 DIAGNOSIS — M859 Disorder of bone density and structure, unspecified: Secondary | ICD-10-CM | POA: Diagnosis not present

## 2018-12-02 DIAGNOSIS — M65331 Trigger finger, right middle finger: Secondary | ICD-10-CM | POA: Diagnosis not present

## 2018-12-02 DIAGNOSIS — M79644 Pain in right finger(s): Secondary | ICD-10-CM | POA: Diagnosis not present

## 2018-12-06 DIAGNOSIS — M65331 Trigger finger, right middle finger: Secondary | ICD-10-CM | POA: Diagnosis not present

## 2018-12-06 DIAGNOSIS — E0590 Thyrotoxicosis, unspecified without thyrotoxic crisis or storm: Secondary | ICD-10-CM | POA: Diagnosis not present

## 2018-12-06 DIAGNOSIS — M859 Disorder of bone density and structure, unspecified: Secondary | ICD-10-CM | POA: Diagnosis not present

## 2018-12-06 DIAGNOSIS — M6281 Muscle weakness (generalized): Secondary | ICD-10-CM | POA: Diagnosis not present

## 2018-12-06 DIAGNOSIS — I10 Essential (primary) hypertension: Secondary | ICD-10-CM | POA: Diagnosis not present

## 2018-12-06 DIAGNOSIS — M79644 Pain in right finger(s): Secondary | ICD-10-CM | POA: Diagnosis not present

## 2018-12-08 DIAGNOSIS — I10 Essential (primary) hypertension: Secondary | ICD-10-CM | POA: Diagnosis not present

## 2018-12-08 DIAGNOSIS — M6281 Muscle weakness (generalized): Secondary | ICD-10-CM | POA: Diagnosis not present

## 2018-12-08 DIAGNOSIS — M79644 Pain in right finger(s): Secondary | ICD-10-CM | POA: Diagnosis not present

## 2018-12-08 DIAGNOSIS — E0590 Thyrotoxicosis, unspecified without thyrotoxic crisis or storm: Secondary | ICD-10-CM | POA: Diagnosis not present

## 2018-12-08 DIAGNOSIS — M859 Disorder of bone density and structure, unspecified: Secondary | ICD-10-CM | POA: Diagnosis not present

## 2018-12-08 DIAGNOSIS — M65331 Trigger finger, right middle finger: Secondary | ICD-10-CM | POA: Diagnosis not present

## 2018-12-09 DIAGNOSIS — M859 Disorder of bone density and structure, unspecified: Secondary | ICD-10-CM | POA: Diagnosis not present

## 2018-12-09 DIAGNOSIS — I10 Essential (primary) hypertension: Secondary | ICD-10-CM | POA: Diagnosis not present

## 2018-12-09 DIAGNOSIS — M6281 Muscle weakness (generalized): Secondary | ICD-10-CM | POA: Diagnosis not present

## 2018-12-09 DIAGNOSIS — E0590 Thyrotoxicosis, unspecified without thyrotoxic crisis or storm: Secondary | ICD-10-CM | POA: Diagnosis not present

## 2018-12-09 DIAGNOSIS — M65331 Trigger finger, right middle finger: Secondary | ICD-10-CM | POA: Diagnosis not present

## 2018-12-09 DIAGNOSIS — M79644 Pain in right finger(s): Secondary | ICD-10-CM | POA: Diagnosis not present

## 2018-12-13 DIAGNOSIS — I10 Essential (primary) hypertension: Secondary | ICD-10-CM | POA: Diagnosis not present

## 2018-12-13 DIAGNOSIS — M79644 Pain in right finger(s): Secondary | ICD-10-CM | POA: Diagnosis not present

## 2018-12-13 DIAGNOSIS — M65331 Trigger finger, right middle finger: Secondary | ICD-10-CM | POA: Diagnosis not present

## 2018-12-13 DIAGNOSIS — M6281 Muscle weakness (generalized): Secondary | ICD-10-CM | POA: Diagnosis not present

## 2018-12-13 DIAGNOSIS — M859 Disorder of bone density and structure, unspecified: Secondary | ICD-10-CM | POA: Diagnosis not present

## 2018-12-13 DIAGNOSIS — E0590 Thyrotoxicosis, unspecified without thyrotoxic crisis or storm: Secondary | ICD-10-CM | POA: Diagnosis not present

## 2018-12-14 DIAGNOSIS — M6281 Muscle weakness (generalized): Secondary | ICD-10-CM | POA: Diagnosis not present

## 2018-12-14 DIAGNOSIS — M859 Disorder of bone density and structure, unspecified: Secondary | ICD-10-CM | POA: Diagnosis not present

## 2018-12-14 DIAGNOSIS — I10 Essential (primary) hypertension: Secondary | ICD-10-CM | POA: Diagnosis not present

## 2018-12-14 DIAGNOSIS — M65331 Trigger finger, right middle finger: Secondary | ICD-10-CM | POA: Diagnosis not present

## 2018-12-14 DIAGNOSIS — M79644 Pain in right finger(s): Secondary | ICD-10-CM | POA: Diagnosis not present

## 2018-12-14 DIAGNOSIS — E0590 Thyrotoxicosis, unspecified without thyrotoxic crisis or storm: Secondary | ICD-10-CM | POA: Diagnosis not present

## 2018-12-16 DIAGNOSIS — E0590 Thyrotoxicosis, unspecified without thyrotoxic crisis or storm: Secondary | ICD-10-CM | POA: Diagnosis not present

## 2018-12-16 DIAGNOSIS — M65331 Trigger finger, right middle finger: Secondary | ICD-10-CM | POA: Diagnosis not present

## 2018-12-16 DIAGNOSIS — M859 Disorder of bone density and structure, unspecified: Secondary | ICD-10-CM | POA: Diagnosis not present

## 2018-12-16 DIAGNOSIS — M6281 Muscle weakness (generalized): Secondary | ICD-10-CM | POA: Diagnosis not present

## 2018-12-16 DIAGNOSIS — M79644 Pain in right finger(s): Secondary | ICD-10-CM | POA: Diagnosis not present

## 2018-12-16 DIAGNOSIS — I10 Essential (primary) hypertension: Secondary | ICD-10-CM | POA: Diagnosis not present

## 2018-12-20 DIAGNOSIS — E0590 Thyrotoxicosis, unspecified without thyrotoxic crisis or storm: Secondary | ICD-10-CM | POA: Diagnosis not present

## 2018-12-20 DIAGNOSIS — M65331 Trigger finger, right middle finger: Secondary | ICD-10-CM | POA: Diagnosis not present

## 2018-12-20 DIAGNOSIS — M6281 Muscle weakness (generalized): Secondary | ICD-10-CM | POA: Diagnosis not present

## 2018-12-20 DIAGNOSIS — M859 Disorder of bone density and structure, unspecified: Secondary | ICD-10-CM | POA: Diagnosis not present

## 2018-12-20 DIAGNOSIS — M79644 Pain in right finger(s): Secondary | ICD-10-CM | POA: Diagnosis not present

## 2018-12-20 DIAGNOSIS — I10 Essential (primary) hypertension: Secondary | ICD-10-CM | POA: Diagnosis not present

## 2018-12-23 DIAGNOSIS — M6281 Muscle weakness (generalized): Secondary | ICD-10-CM | POA: Diagnosis not present

## 2018-12-23 DIAGNOSIS — E0590 Thyrotoxicosis, unspecified without thyrotoxic crisis or storm: Secondary | ICD-10-CM | POA: Diagnosis not present

## 2018-12-23 DIAGNOSIS — M79644 Pain in right finger(s): Secondary | ICD-10-CM | POA: Diagnosis not present

## 2018-12-23 DIAGNOSIS — I10 Essential (primary) hypertension: Secondary | ICD-10-CM | POA: Diagnosis not present

## 2018-12-23 DIAGNOSIS — M65331 Trigger finger, right middle finger: Secondary | ICD-10-CM | POA: Diagnosis not present

## 2018-12-23 DIAGNOSIS — M859 Disorder of bone density and structure, unspecified: Secondary | ICD-10-CM | POA: Diagnosis not present

## 2019-01-03 DIAGNOSIS — Z1159 Encounter for screening for other viral diseases: Secondary | ICD-10-CM | POA: Diagnosis not present

## 2019-01-04 LAB — NOVEL CORONAVIRUS, NAA: SARS-CoV-2, NAA: NOT DETECTED

## 2019-01-16 IMAGING — DX DG CHEST 2V
2 series · 2 of 2 positions shown · non-contrast
Comparison: None.

CLINICAL DATA: Chest pain

EXAM:
CHEST - 2 VIEW

[chest ap]
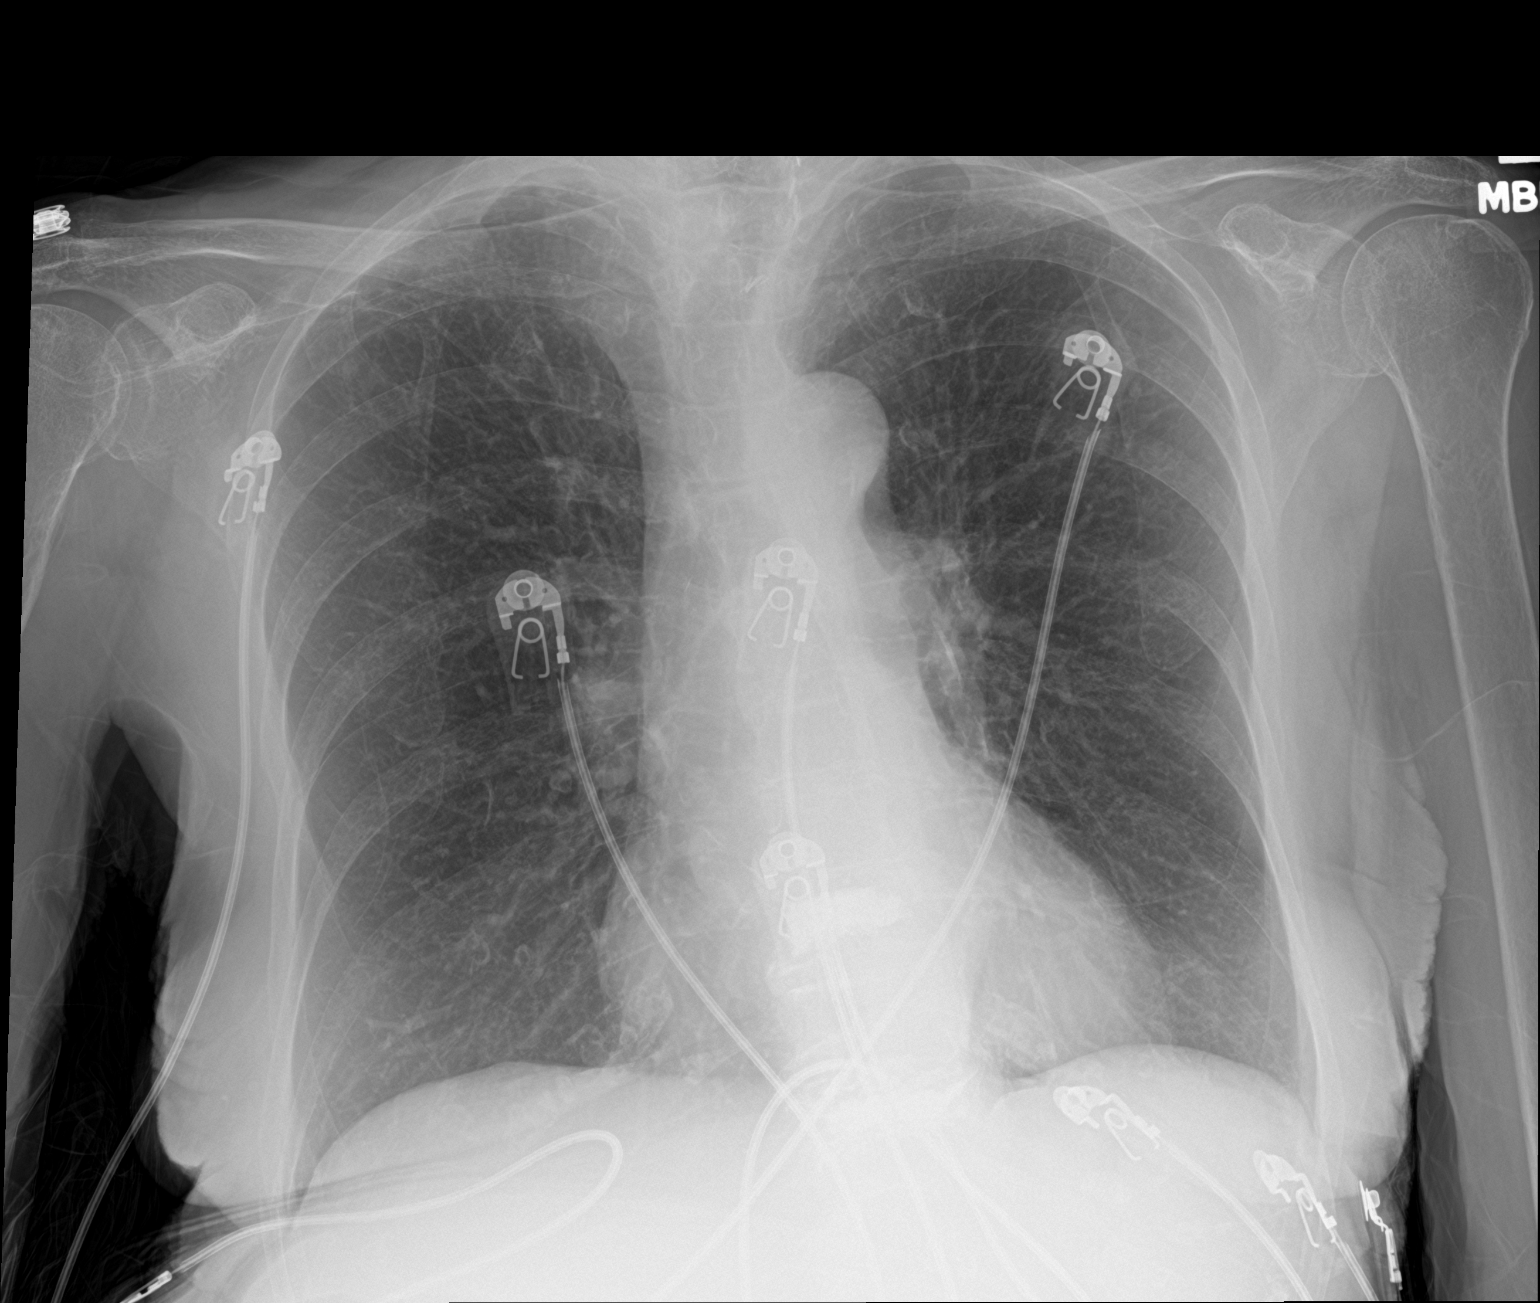

[chest lat]
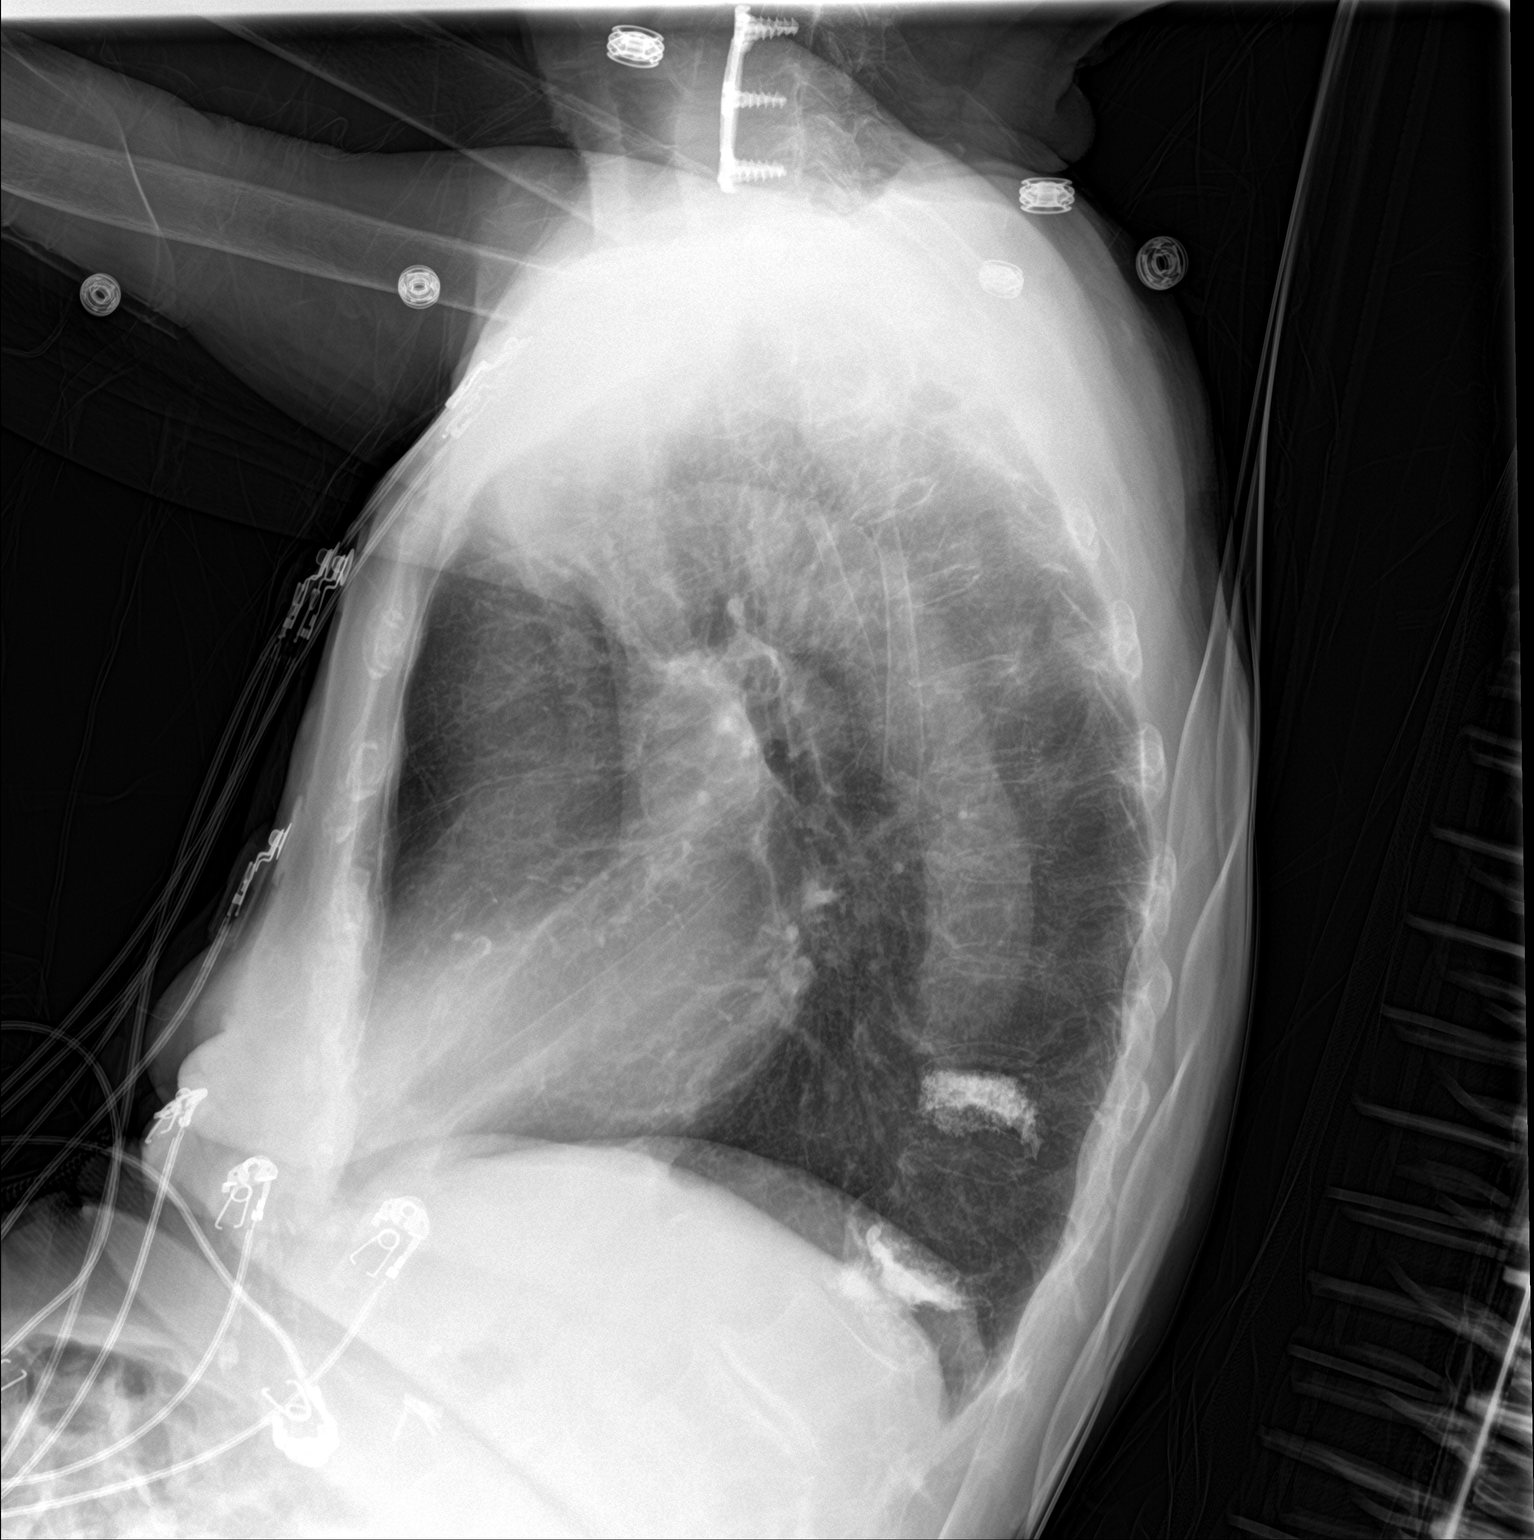

[2 of 2 positions shown; findings below may reference images not displayed]

FINDINGS: The heart size and mediastinal contours are within normal limits.
Both lungs are clear. The visualized skeletal structures are
unremarkable. Prior vertebral augmentation.
IMPRESSION: No active cardiopulmonary disease.

## 2019-01-26 DIAGNOSIS — Z03818 Encounter for observation for suspected exposure to other biological agents ruled out: Secondary | ICD-10-CM | POA: Diagnosis not present

## 2019-02-02 DIAGNOSIS — Z1159 Encounter for screening for other viral diseases: Secondary | ICD-10-CM | POA: Diagnosis not present

## 2019-02-08 ENCOUNTER — Encounter: Payer: Self-pay | Admitting: Internal Medicine

## 2019-02-08 ENCOUNTER — Non-Acute Institutional Stay: Payer: Medicare Other | Admitting: Internal Medicine

## 2019-02-08 DIAGNOSIS — T7840XA Allergy, unspecified, initial encounter: Secondary | ICD-10-CM

## 2019-02-08 DIAGNOSIS — E1142 Type 2 diabetes mellitus with diabetic polyneuropathy: Secondary | ICD-10-CM

## 2019-02-08 DIAGNOSIS — E785 Hyperlipidemia, unspecified: Secondary | ICD-10-CM | POA: Diagnosis not present

## 2019-02-08 DIAGNOSIS — R4189 Other symptoms and signs involving cognitive functions and awareness: Secondary | ICD-10-CM

## 2019-02-08 DIAGNOSIS — I214 Non-ST elevation (NSTEMI) myocardial infarction: Secondary | ICD-10-CM

## 2019-02-08 NOTE — Progress Notes (Signed)
Location:  [facility] Room Number: 381 Place of Service:  ALF (22) Provider:Luceal Hollibaugh L,MD   Mast, Man X, NP  Patient Care Team: Mast, Man X, NP as PCP - General (Internal Medicine) [place] Mast, Man X, NP as Nurse Practitioner (Nurse Practitioner) Jacelyn Pi, MD as Consulting Physician (Endocrinology) Calvert Cantor, MD as Consulting Physician (Ophthalmology) Vickey Huger, MD as Consulting Physician (Orthopedic Surgery) Suella Broad, MD as Consulting Physician (Physical Medicine and Rehabilitation) Erline Levine, MD as Consulting Physician (Neurosurgery) Juanita Craver, MD as Consulting Physician (Gastroenterology) Martinique, Peter M, MD as Consulting Physician (Cardiology) Megan Salon, MD as Consulting Physician (Gynecology)  Extended Emergency Contact Information Primary Emergency Contact: [name], [place] Phone: [phone] Relation: Friend Secondary Emergency Contact: Edmon Crape Mobile Phone: [phone] Relation: Other Preferred language: Cleophus Molt Interpreter needed? No Guardian: Meryl Dare Mobile Phone: [phone] Relation: Legal Guardian Preferred language: English Interpreter needed? No  Code Status: DNR  Goals of care: Advanced Directive information Advanced Directives 02/08/2019  Does Patient Have a Medical Advance Directive? Yes  Type of Advance Directive Living will;Healthcare Power of [place];Out of facility DNR (pink MOST or yellow form)  Does patient want to make changes to medical advance directive? No - Patient declined  Copy of [place] in Chart? Yes - validated most recent copy scanned in chart (See row information)  Would patient like information on creating a medical advance directive? -  Pre-existing out of facility DNR order (yellow form or pink MOST form) Yellow form placed in chart (order not valid for inpatient use)     Chief Complaint  Patient presents with  . Medical Management of Chronic Issues    routine visit   . Health Maintenance    Foot exam, hemoglobin a1c,ophthalmology exam,influenza exam    HPI:  [name] is a 85 y.o. female seen today for medical management of chronic diseases.    Patient is Resident of the ALF [facility] in [facility].  Patient has h/o Hypertension, Diabetes Mellitus Type 2 , and Cognitive impairnement. She also has h/o NSTEMI in 12/19 managed Conservatively with EF of 55% Also has h/o Allergic Dermatitis Patient is doing very well in the facility.  She walks with a walker.  That has not had any fall.  Denies any chest pain or shortness of breath. Her only new thing is that she has lost weight almost 10 pounds in past few months per nurses she does not eat that well.  She is on Protostat.  Past Medical History:  Diagnosis Date  . Contact dermatitis and other eczema, due to unspecified cause   . Cortical senile cataract   . GERD (gastroesophageal reflux disease) 02/17/2013  . Hyperparathyroidism, unspecified ([place]) 03/26/2010  . Irritable bowel syndrome   . Keratoconjunctivitis sicca, not specified as Sjogren's   . Loss of weight   . Macular degeneration (senile) of retina, unspecified   . Memory loss   . Other and unspecified hyperlipidemia   . Other malaise and fatigue 06/17/2010  . Pain in joint, site unspecified   . Pathologic fracture of vertebrae   . Reflux esophagitis   . Scoliosis (and kyphoscoliosis), idiopathic   . Senile osteoporosis   . Type II or unspecified type diabetes mellitus without mention of complication, uncontrolled   . Unspecified essential hypertension   . Unspecified hereditary and idiopathic peripheral neuropathy   . Unspecified vitamin D deficiency  Past Surgical History:  Procedure Laterality Date  . APPENDECTOMY    . BREAST LUMPECTOMY  02/05/2010   Dr. Christie Beckers  . CATARACT EXTRACTION W/ INTRAOCULAR LENS  IMPLANT, BILATERAL  04/2013    Dr. Bing Plume  . CHOLECYSTECTOMY  2004   Dr. Zella Richer  . COLONOSCOPY  2001   Dr. Collene Mares  . fibroidectomy  1950  . [place]  . KYPHOPLASTY  2010   T10 & T12 Dr. Erline Levine  . MEDIAL PARTIAL KNEE REPLACEMENT Left 2004   Dr. Ronnie Derby  . SPINE SURGERY     vertebroplasty T10, T12  . SPINE SURGERY  11/02/2001    C4-5 & C5-6 Titanium Plate Placed Dr. Carloyn Manner  . THYROIDECTOMY, PARTIAL Left 2007  . TOTAL KNEE ARTHROPLASTY Right 2009   Dr. Alvan Dame     Allergies  Allergen Reactions  . Bactrim     unknown  . Betadine [Povidone Iodine]     unknown  . Metformin And Related     Hurt stomach  . Nutmeg Oil (Myristica Oil)     unknown  . Sulfa Antibiotics     unknown    Outpatient Encounter Medications as of 02/08/2019  Medication Sig  . amLODipine (NORVASC) 5 MG tablet Take 1 tablet (5 mg total) by mouth daily. (Patient taking differently: Take 2.5 mg by mouth daily. )  . aspirin EC 81 MG EC tablet Take 1 tablet (81 mg total) by mouth daily.  Marland Kitchen augmented betamethasone dipropionate (DIPROLENE-AF) 0.05 % ointment Apply topically 2 (two) times daily. Apply  to active rash on extremities . 1 app immediately after shower.  . carboxymethylcellul-glycerin (OPTIVE) 0.5-0.9 % ophthalmic solution Place 2 drops into the left eye every 8 (eight) hours as needed for dry eyes.   . furosemide (LASIX) 20 MG tablet Take 20 mg by mouth See admin instructions. every Monday, Wednesday, Friday  . losartan (COZAAR) 50 MG tablet TAKE 1 TABLET ONCE DAILY TO CONTROL BLOOD PRESSURE.  . metFORMIN (GLUCOPHAGE-XR) 500 MG 24 hr tablet Take 500 mg by mouth 2 (two) times daily.   . metoprolol tartrate (LOPRESSOR) 25 MG tablet Take 12.5 mg by mouth 2 (two) times daily. Hold Metoprolol for SBP < 110 and DSP <50  . Multiple Vitamins-Minerals (MULTIVITAMINS THER. W/MINERALS) TABS tablet Take 1 tablet by mouth daily. 400 mcg tablet  . nitroGLYCERIN (NITROSTAT) 0.4 MG SL tablet Place 0.4 mg under the tongue every 5 (five)  minutes as needed for chest pain.   No facility-administered encounter medications on file as of 02/08/2019.     Review of Systems  Review of Systems  Constitutional: Negative for activity change, appetite change, chills, diaphoresis, fatigue and fever.  HENT: Negative for mouth sores, postnasal drip, rhinorrhea, sinus pain and sore throat.   Respiratory: Negative for apnea, cough, chest tightness, shortness of breath and wheezing.   Cardiovascular: Negative for chest pain, palpitations and leg swelling.  Gastrointestinal: Negative for abdominal distention, abdominal pain, constipation, diarrhea, nausea and vomiting.  Genitourinary: Negative for dysuria and frequency.  Musculoskeletal: Negative for arthralgias, joint swelling and myalgias.  Skin: Negative for rash.  Neurological: Negative for dizziness, syncope, weakness, light-headedness and numbness.  Psychiatric/Behavioral: Negative for behavioral problems, confusion and sleep disturbance.     Immunization History  Administered Date(s) Administered  . Influenza Whole 03/30/2012, 04/13/2013, 04/04/2018  . Influenza-Unspecified 04/17/2014, 03/29/2015  . Pneumococcal Conjugate-13 12/08/2017  . Pneumococcal Polysaccharide-23 06/30/2001  . Td 06/30/2001, 12/08/2017   Pertinent  Health Maintenance Due  Topic  Date Due  . FOOT EXAM  12/21/2015  . OPHTHALMOLOGY EXAM  04/02/2016  . HEMOGLOBIN A1C  11/09/2018  . INFLUENZA VACCINE  01/29/2019  . DEXA SCAN  Completed  . PNA vac Low Risk Adult  Completed   Fall Risk  11/24/2017 07/09/2017 11/01/2015 07/26/2015 06/21/2015  Falls in the past year? No No No No No   Functional Status Survey:    Vitals:   02/08/19 0839  BP: (!) 136/52  Pulse: 68  Resp: 18  Temp: 98.3 F (36.8 C)  SpO2: 93%  Weight: 100 lb (45.4 kg)  Height: 5\' 3"  (1.6 m)   Body mass index is 17.71 kg/m. Physical Exam  Constitutional:. Well-developed and well-nourished.  HENT:  Head: Normocephalic.  Mouth/Throat:  Oropharynx is clear and moist.  Eyes: Pupils are equal, round, and reactive to light.  Neck: Neck supple.  Cardiovascular: Normal rate and normal heart sounds.  No murmur heard. Pulmonary/Chest: Effort normal and breath sounds normal. No respiratory distress. No wheezes. She has no rales.  Abdominal: Soft. Bowel sounds are normal. No distension. There is no tenderness. There is no rebound.  Musculoskeletal: No Edema Lymphadenopathy: none Neurological: No Focal deficits Responds very Appropriately and is less anxious then before  Skin: Skin is warm and dry.  Psychiatric: Normal mood and affect. Behavior is normal. Thought content normal.    Labs reviewed: Recent Labs    06/26/18 2343 06/27/18 1015 07/22/18 08/12/18  NA 135 138 139 139  K 4.0 4.0 4.2 4.2  CL 102 102 106 102  CO2 24 27  --  29  GLUCOSE 265* 243*  --   --   BUN 16 12 17 16   CREATININE 0.78 0.76 0.8 0.9  CALCIUM 10.2 10.5* 10.7 10.8   Recent Labs    06/26/18 2343 07/22/18  AST 23 15  ALT 16 12  ALKPHOS 54 62  BILITOT 0.6  --   PROT 5.7* 6.0  ALBUMIN 3.4* 3.7   Recent Labs    06/26/18 2343 06/28/18 0200 07/22/18 08/12/18  WBC 10.3 9.7 10.7 9.3  HGB 11.3* 10.7* 11.1* 12.4  HCT 34.5* 32.4* 33* 37  MCV 94.5 93.1  --   --   PLT 234 236 284 284   Lab Results  Component Value Date   TSH 0.45 08/13/2017   Lab Results  Component Value Date   HGBA1C 6.7 05/11/2018   Lab Results  Component Value Date   CHOL 169 06/27/2018   HDL 70 06/27/2018   LDLCALC 96 06/27/2018   TRIG 17 06/27/2018   CHOLHDL 2.4 06/27/2018    Significant Diagnostic Results in last 30 days:  No results found.  Assessment/Plan  NSTEMI (non-ST elevated myocardial infarction) Was managed conservatively On aspirin, Losartan and Beta blocker Not on statin due to some concern of allergies Repeat Fasting Lipid Essential hypertension BP controlled on Low does of Lopressor, Losartan and Norvasc Weight loss Has lost 10 lbs in  past 4-5 months Will start on Remeron 7.5 mg QHS if ok with POA Check CMp,CBC, and TSH Cognitive impairment Check MMSE No Namenda due to weight loss right now Non Specific Rash Resolved Uses Triamcinolone PRN DM Type 2  Decreased the dose of Metformin to 250 mg BID due to weight loss A1C was 6.7 before Repeat A1C CBG Q Weekly LE edema Continue Low dose of Lasix Check BMP Family/ staff Communication:   Labs/tests ordered:  CMP,CBC,TSH, A1C and Fasting Lioid   Total time spent in this patient care  encounter was  45_  minutes; greater than 50% of the visit spent counseling patient and staff, reviewing records , Labs and coordinating care for problems addressed at this encounter.

## 2019-02-10 DIAGNOSIS — I10 Essential (primary) hypertension: Secondary | ICD-10-CM | POA: Diagnosis not present

## 2019-02-10 DIAGNOSIS — E118 Type 2 diabetes mellitus with unspecified complications: Secondary | ICD-10-CM | POA: Diagnosis not present

## 2019-02-11 LAB — LIPID PANEL
Cholesterol: 164 (ref 0–200)
HDL: 53 (ref 35–70)
LDL Cholesterol: 97
LDl/HDL Ratio: 3.1
Triglycerides: 59 (ref 40–160)

## 2019-02-15 DIAGNOSIS — I10 Essential (primary) hypertension: Secondary | ICD-10-CM | POA: Diagnosis not present

## 2019-02-15 DIAGNOSIS — E1142 Type 2 diabetes mellitus with diabetic polyneuropathy: Secondary | ICD-10-CM | POA: Diagnosis not present

## 2019-02-15 LAB — HEPATIC FUNCTION PANEL
ALT: 17 (ref 7–35)
AST: 21 (ref 13–35)
Alkaline Phosphatase: 46 (ref 25–125)
Bilirubin, Total: 0.6

## 2019-02-15 LAB — BASIC METABOLIC PANEL
BUN: 21 (ref 4–21)
Creatinine: 0.9 (ref 0.5–1.1)
Glucose: 102
Potassium: 4.7 (ref 3.4–5.3)
Sodium: 138 (ref 137–147)

## 2019-02-17 ENCOUNTER — Encounter: Payer: Self-pay | Admitting: Nurse Practitioner

## 2019-02-17 ENCOUNTER — Other Ambulatory Visit: Payer: Self-pay | Admitting: *Deleted

## 2019-02-17 ENCOUNTER — Non-Acute Institutional Stay: Payer: Medicare Other | Admitting: Nurse Practitioner

## 2019-02-17 DIAGNOSIS — E213 Hyperparathyroidism, unspecified: Secondary | ICD-10-CM | POA: Diagnosis not present

## 2019-02-17 DIAGNOSIS — R609 Edema, unspecified: Secondary | ICD-10-CM | POA: Diagnosis not present

## 2019-02-17 DIAGNOSIS — T7840XA Allergy, unspecified, initial encounter: Secondary | ICD-10-CM | POA: Diagnosis not present

## 2019-02-17 LAB — COMPLETE METABOLIC PANEL WITH GFR
Albumin: 3.9
Calcium: 11
Carbon Dioxide, Total: 28
Chloride: 100
EGFR (Non-African Amer.): 53
Globulin: 2.1
PTH: 48
Total Protein: 6 g/dL

## 2019-02-17 NOTE — Progress Notes (Signed)
Location:  [facility] Room Number: 161 Place of Service:  ALF [phone]) Provider:  Marlana Latus  NP  Degan Hanser X, NP  Patient Care Team: Layani Foronda X, NP as PCP - General (Internal Medicine) [place] Yakir Wenke X, NP as Nurse Practitioner (Nurse Practitioner) Jacelyn Pi, MD as Consulting Physician (Endocrinology) Calvert Cantor, MD as Consulting Physician (Ophthalmology) Vickey Huger, MD as Consulting Physician (Orthopedic Surgery) Suella Broad, MD as Consulting Physician (Physical Medicine and Rehabilitation) Erline Levine, MD as Consulting Physician (Neurosurgery) Juanita Craver, MD as Consulting Physician (Gastroenterology) Martinique, Peter M, MD as Consulting Physician (Cardiology) Megan Salon, MD as Consulting Physician (Gynecology)  Extended Emergency Contact Information Primary Emergency Contact: [name], [place] of [place] Phone: [phone] Relation: Friend Secondary Emergency Contact: Edmon Crape Mobile Phone: [phone] Relation: Other Preferred language: Cleophus Molt Interpreter needed? No Guardian: Meryl Dare Mobile Phone: [phone] Relation: Legal Guardian Preferred language: English Interpreter needed? No  Code Status:  DNR Goals of care: Advanced Directive information Advanced Directives 02/08/2019  Does Patient Have a Medical Advance Directive? Yes  Type of Advance Directive Living will;Healthcare Power of [place];Out of facility DNR (pink MOST or yellow form)  Does patient want to make changes to medical advance directive? No - Patient declined  Copy of [place] in Chart? Yes - validated most recent copy scanned in chart (See row information)  Would patient like information on creating a medical advance directive? -  Pre-existing out of facility DNR order (yellow form or pink MOST form) Yellow form placed in chart (order not valid for inpatient use)     Chief Complaint  Patient presents with  . Acute Visit    C/o - elevated serum calcium    HPI:  Pt is a 85 y.o. female seen today for an acute visit for hypercalcemia, the patient stated its not new, serum Ca 11 02/15/19, 10.8 08/12/18, she denied abd or bone pain, fatigue, loss of appetite or thirst, nausea, constipation, excessive urination, change of mentation, muscle weakness, or weight loss. Followed by Dr. Chalmers Cater. Had surgery by Dr. Harlow Asa in 2007.  Hx of edema, BLE, trace, taking Furosemide 20mg  M, W, F. Chronic allergic dermatitis, scaly rash above ankles persisted, topical steroids available to her.    Past Medical History:  Diagnosis Date  . Contact dermatitis and other eczema, due to unspecified cause   . Cortical senile cataract   . GERD (gastroesophageal reflux disease) 02/17/2013  . Hyperparathyroidism, unspecified ([place]) 03/26/2010  . Irritable bowel syndrome   . Keratoconjunctivitis sicca, not specified as Sjogren's   . Loss of weight   . Macular degeneration (senile) of retina, unspecified   . Memory loss   . Other and unspecified hyperlipidemia   . Other malaise and fatigue 06/17/2010  . Pain in joint, site unspecified   . Pathologic fracture of vertebrae   . Reflux esophagitis   . Scoliosis (and kyphoscoliosis), idiopathic   . Senile osteoporosis   . Type II or unspecified type diabetes mellitus without mention of complication, uncontrolled   . Unspecified essential hypertension   . Unspecified hereditary and idiopathic peripheral neuropathy   . Unspecified vitamin D deficiency    Past Surgical History:  Procedure Laterality Date  . APPENDECTOMY    . BREAST LUMPECTOMY  02/05/2010   Dr. Christie Beckers  . CATARACT EXTRACTION W/ INTRAOCULAR LENS  IMPLANT, BILATERAL  04/2013   Dr. Bing Plume  .  CHOLECYSTECTOMY  2004   Dr. Zella Richer  . COLONOSCOPY  2001   Dr. Collene Mares  . fibroidectomy  1950  . [place]  . KYPHOPLASTY  2010   T10 & T12 Dr. Erline Levine   . MEDIAL PARTIAL KNEE REPLACEMENT Left 2004   Dr. Ronnie Derby  . SPINE SURGERY     vertebroplasty T10, T12  . SPINE SURGERY  11/02/2001    C4-5 & C5-6 Titanium Plate Placed Dr. Carloyn Manner  . THYROIDECTOMY, PARTIAL Left 2007  . TOTAL KNEE ARTHROPLASTY Right 2009   Dr. Alvan Dame     Allergies  Allergen Reactions  . Bactrim     unknown  . Betadine [Povidone Iodine]     unknown  . Metformin And Related     Hurt stomach  . Nutmeg Oil (Myristica Oil)     unknown  . Sulfa Antibiotics     unknown    Outpatient Encounter Medications as of 02/17/2019  Medication Sig  . amLODipine (NORVASC) 5 MG tablet Take 1 tablet (5 mg total) by mouth daily. (Patient taking differently: Take 2.5 mg by mouth daily. )  . aspirin EC 81 MG EC tablet Take 1 tablet (81 mg total) by mouth daily.  Marland Kitchen augmented betamethasone dipropionate (DIPROLENE-AF) 0.05 % ointment Apply topically 2 (two) times daily as needed. Apply  to active rash on extremities . 1 app immediately after shower.  . carboxymethylcellul-glycerin (OPTIVE) 0.5-0.9 % ophthalmic solution Place 2 drops into the left eye every 8 (eight) hours as needed for dry eyes.   . furosemide (LASIX) 20 MG tablet Take 20 mg by mouth See admin instructions. every Monday, Wednesday, Friday  . losartan (COZAAR) 50 MG tablet TAKE 1 TABLET ONCE DAILY TO CONTROL BLOOD PRESSURE.  . metFORMIN (GLUCOPHAGE-XR) 500 MG 24 hr tablet Take 250 mg by mouth 2 (two) times daily. Take 1/2 tablet  . metoprolol tartrate (LOPRESSOR) 25 MG tablet Take 12.5 mg by mouth 2 (two) times daily. Hold Metoprolol for SBP < 110 and DSP <50  . mirtazapine (REMERON) 7.5 MG tablet Take 7.5 mg by mouth at bedtime.  . Multiple Vitamins-Minerals (MULTIVITAMINS THER. W/MINERALS) TABS tablet Take 1 tablet by mouth daily. 400 mcg tablet  . nitroGLYCERIN (NITROSTAT) 0.4 MG SL tablet Place 0.4 mg under the tongue every 5 (five) minutes as needed for chest pain. PLACE 0.4 MG UNDER TONGUE EVERY 5 MINUTES X3 FOR CHEST PAIN, IF  NO RELIEF, CALL MD OR SEND TO ED FOR FURTHER EVALUATION.   No facility-administered encounter medications on file as of 02/17/2019.    ROS was provided with assistance of staff Review of Systems  Constitutional: Negative for activity change, appetite change, chills, diaphoresis, fatigue, fever and unexpected weight change.  HENT: Positive for hearing loss. Negative for voice change.   Respiratory: Negative for cough, shortness of breath and wheezing.   Cardiovascular: Positive for leg swelling. Negative for chest pain and palpitations.  Gastrointestinal: Negative for abdominal distention, abdominal pain, constipation, diarrhea, nausea and vomiting.  Genitourinary: Negative for difficulty urinating, dysuria and urgency.  Musculoskeletal: Positive for back pain and gait problem.  Skin: Positive for rash. Negative for wound.  Neurological: Negative for dizziness, speech difficulty, weakness and headaches.       Memory lapses.   Psychiatric/Behavioral: Negative for behavioral problems, hallucinations and sleep disturbance. The patient is not nervous/anxious.     Immunization History  Administered Date(s) Administered  . Influenza Whole 03/30/2012, 04/13/2013, 04/04/2018  . Influenza-Unspecified 04/17/2014, 03/29/2015  . Pneumococcal Conjugate-13  12/08/2017  . Pneumococcal Polysaccharide-23 06/30/2001  . Td 06/30/2001, 12/08/2017   Pertinent  Health Maintenance Due  Topic Date Due  . FOOT EXAM  12/21/2015  . OPHTHALMOLOGY EXAM  04/02/2016  . HEMOGLOBIN A1C  11/09/2018  . INFLUENZA VACCINE  01/29/2019  . DEXA SCAN  Completed  . PNA vac Low Risk Adult  Completed   Fall Risk  11/24/2017 07/09/2017 11/01/2015 07/26/2015 06/21/2015  Falls in the past year? No No No No No   Functional Status Survey:    Vitals:   02/17/19 1044  BP: (!) 122/56  Pulse: 76  Resp: 18  Temp: 98.2 F (36.8 C)  SpO2: 95%  Weight: 100 lb (45.4 kg)  Height: 5\' 3"  (1.6 m)   Body mass index is 17.71 kg/m.  Physical Exam Vitals signs and nursing note reviewed.  Constitutional:      General: She is not in acute distress.    Appearance: Normal appearance. She is not ill-appearing, toxic-appearing or diaphoretic.  HENT:     Head: Normocephalic and atraumatic.     Nose: Nose normal.     Mouth/Throat:     Mouth: Mucous membranes are moist.  Eyes:     Extraocular Movements: Extraocular movements intact.     Conjunctiva/sclera: Conjunctivae normal.     Pupils: Pupils are equal, round, and reactive to light.  Cardiovascular:     Rate and Rhythm: Normal rate and regular rhythm.     Heart sounds: No murmur.  Pulmonary:     Breath sounds: No wheezing, rhonchi or rales.  Abdominal:     General: There is no distension.     Palpations: Abdomen is soft.     Tenderness: There is no abdominal tenderness. There is no right CVA tenderness, left CVA tenderness, guarding or rebound.  Musculoskeletal:     Right lower leg: Edema present.     Left lower leg: Edema present.     Comments: Trace edema BLE, ambulates with walker, chronic right lower back pain-positional. kyphoscoliosis   Skin:    General: Skin is warm and dry.     Findings: Rash present.     Comments: Scaly rash above ankles  Neurological:     General: No focal deficit present.     Mental Status: She is alert. Mental status is at baseline.     Comments: Oriented to person, place  Psychiatric:        Mood and Affect: Mood normal.        Behavior: Behavior normal.        Thought Content: Thought content normal.        Judgment: Judgment normal.     Labs reviewed: Recent Labs    06/26/18 2343 06/27/18 1015 07/22/18 08/12/18 02/15/19  NA 135 138 139 139 138  K 4.0 4.0 4.2 4.2 4.7  CL 102 102 106 102 100  CO2 24 27  --  29 28  GLUCOSE 265* 243*  --   --   --   BUN 16 12 17 16 21   CREATININE 0.78 0.76 0.8 0.9 0.9  CALCIUM 10.2 10.5* 10.7 10.8 11.0   Recent Labs    06/26/18 2343 07/22/18 02/15/19  AST 23 15 21   ALT 16 12 17    ALKPHOS 54 62 46  BILITOT 0.6  --   --   PROT 5.7* 6.0 6.0  ALBUMIN 3.4* 3.7 3.9   Recent Labs    06/26/18 2343 06/28/18 0200 07/22/18 08/12/18  WBC 10.3 9.7 10.7 9.3  HGB 11.3* 10.7* 11.1* 12.4  HCT 34.5* 32.4* 33* 37  MCV 94.5 93.1  --   --   PLT 234 236 284 284   Lab Results  Component Value Date   TSH 0.45 08/13/2017   Lab Results  Component Value Date   HGBA1C 6.7 05/11/2018   Lab Results  Component Value Date   CHOL 164 02/11/2019   HDL 53 02/11/2019   LDLCALC 97 02/11/2019   TRIG 59 02/11/2019   CHOLHDL 2.4 06/27/2018    Significant Diagnostic Results in last 30 days:  No results found.  Assessment/Plan Hyperparathyroidism ([place]) Followed by Dr. Chalmers Cater. Had surgery by Dr. Harlow Asa in 2007. 02/17/19 the patient stated its not new, serum Ca 11 02/15/19, 10.8 08/12/18, she denied abd or bone pain, fatigue, loss of appetite or thirst, nausea, constipation, excessive urination, change of mentation, muscle weakness, or weight loss. 02/15/19 PTH 48 (14-64) BMP 2 weeks  Allergic rash  Persisted, f/u dermatology, topical steroids available to her  Edema Chronic BLE, trace, continue Furosemide 20mg  3x/week.      Family/ staff Communication: plan of care reviewed with the patient and charge nurse   Labs/tests ordered:  BMP 2 weeks  Time spend 40 minutes.

## 2019-02-17 NOTE — Assessment & Plan Note (Addendum)
Followed by Dr. Chalmers Cater. Had surgery by Dr. Harlow Asa in 2007. 02/17/19 the patient stated its not new, serum Ca 11 02/15/19, 10.8 08/12/18, she denied abd or bone pain, fatigue, loss of appetite or thirst, nausea, constipation, excessive urination, change of mentation, muscle weakness, or weight loss. 02/15/19 PTH 48 (14-64) BMP 2 weeks

## 2019-02-17 NOTE — Assessment & Plan Note (Signed)
Chronic BLE, trace, continue Furosemide 20mg  3x/week.

## 2019-02-17 NOTE — Assessment & Plan Note (Signed)
Persisted, f/u dermatology, topical steroids available to her

## 2019-03-03 DIAGNOSIS — I10 Essential (primary) hypertension: Secondary | ICD-10-CM | POA: Diagnosis not present

## 2019-03-03 LAB — BASIC METABOLIC PANEL
BUN: 18 (ref 4–21)
Creatinine: 1 (ref <=1.1)
Glucose: 87
Potassium: 4.2 (ref 3.4–5.3)
Sodium: 139 (ref 137–147)

## 2019-03-04 ENCOUNTER — Other Ambulatory Visit: Payer: Self-pay | Admitting: *Deleted

## 2019-03-04 LAB — BASIC METABOLIC PANEL
Calcium: 11.1
Carbon Dioxide, Total: 28
Chloride: 105

## 2019-05-03 DIAGNOSIS — Z03818 Encounter for observation for suspected exposure to other biological agents ruled out: Secondary | ICD-10-CM | POA: Diagnosis not present

## 2019-05-10 DIAGNOSIS — Z20828 Contact with and (suspected) exposure to other viral communicable diseases: Secondary | ICD-10-CM | POA: Diagnosis not present

## 2019-05-17 DIAGNOSIS — Z20828 Contact with and (suspected) exposure to other viral communicable diseases: Secondary | ICD-10-CM | POA: Diagnosis not present

## 2019-05-23 ENCOUNTER — Non-Acute Institutional Stay: Payer: Medicare Other | Admitting: Nurse Practitioner

## 2019-05-23 ENCOUNTER — Encounter: Payer: Self-pay | Admitting: Nurse Practitioner

## 2019-05-23 DIAGNOSIS — I10 Essential (primary) hypertension: Secondary | ICD-10-CM | POA: Diagnosis not present

## 2019-05-23 DIAGNOSIS — E1142 Type 2 diabetes mellitus with diabetic polyneuropathy: Secondary | ICD-10-CM

## 2019-05-23 DIAGNOSIS — F418 Other specified anxiety disorders: Secondary | ICD-10-CM

## 2019-05-23 NOTE — Assessment & Plan Note (Signed)
Her mood, weight are stable, continue Mirtazapine 7.5mg  qd.

## 2019-05-23 NOTE — Assessment & Plan Note (Signed)
Blood pressure is controlled, continue Losartan 50mg  qd, Amlodipine 2.5mg  qd, ASA 81mg  qd.

## 2019-05-23 NOTE — Progress Notes (Addendum)
Location:   [facility] Room Number: [id number] Place of Service:  ALF (13) Provider:  Armin Yerger X, NP  Johncarlo Maalouf X, NP  Patient Care Team: Rodina Pinales X, NP as PCP - General (Internal Medicine) [place] Leshawn Houseworth X, NP as Nurse Practitioner (Nurse Practitioner) Jacelyn Pi, MD as Consulting Physician (Endocrinology) Calvert Cantor, MD as Consulting Physician (Ophthalmology) Vickey Huger, MD as Consulting Physician (Orthopedic Surgery) Suella Broad, MD as Consulting Physician (Physical Medicine and Rehabilitation) Erline Levine, MD as Consulting Physician (Neurosurgery) Juanita Craver, MD as Consulting Physician (Gastroenterology) Martinique, Peter M, MD as Consulting Physician (Cardiology) Megan Salon, MD as Consulting Physician (Gynecology)  Extended Emergency Contact Information Primary Emergency Contact: [name], [place] Phone: [phone] Relation: Friend Secondary Emergency Contact: Edmon Crape Mobile Phone: [phone] Relation: Other Preferred language: Cleophus Molt Interpreter needed? No Guardian: Meryl Dare Mobile Phone: [phone] Relation: Legal Guardian Preferred language: English Interpreter needed? No  Code Status:  DNR Goals of care: Advanced Directive information Advanced Directives 05/23/2019  Does Patient Have a Medical Advance Directive? Yes  Type of Advance Directive Out of facility DNR (pink MOST or yellow form)  Does patient want to make changes to medical advance directive? No - Patient declined  Copy of [place] in Chart? -  Would patient like information on creating a medical advance directive? -  Pre-existing out of facility DNR order (yellow form or pink MOST form) Yellow form placed in chart (order not valid for inpatient use)     Chief Complaint  Patient presents with  . Medical Management of Chronic Issues  . Health Maintenance    Hemoglobin  A1C, foot and eye exam    HPI:  Pt is a 85 y.o. female seen today for medical management of chronic diseases.    The patient has Hx of T2DM, blood sugar is controlled, on Metformin 250mg  bid.  Her mood/weigh stable, on Mirtazapine 7.5mg  qd. HTN, blood pressure is controlled on Losartan 50mg  qd. ASA 81mg  qd, Amlodipine 2.5mg  qd.    Past Medical History:  Diagnosis Date  . Contact dermatitis and other eczema, due to unspecified cause   . Cortical senile cataract   . GERD (gastroesophageal reflux disease) 02/17/2013  . Hyperparathyroidism, unspecified ([place]) 03/26/2010  . Irritable bowel syndrome   . Keratoconjunctivitis sicca, not specified as Sjogren's   . Loss of weight   . Macular degeneration (senile) of retina, unspecified   . Memory loss   . Other and unspecified hyperlipidemia   . Other malaise and fatigue 06/17/2010  . Pain in joint, site unspecified   . Pathologic fracture of vertebrae   . Reflux esophagitis   . Scoliosis (and kyphoscoliosis), idiopathic   . Senile osteoporosis   . Type II or unspecified type diabetes mellitus without mention of complication, uncontrolled   . Unspecified essential hypertension   . Unspecified hereditary and idiopathic peripheral neuropathy   . Unspecified vitamin D deficiency    Past Surgical History:  Procedure Laterality Date  . APPENDECTOMY    . BREAST LUMPECTOMY  02/05/2010   Dr. Christie Beckers  . CATARACT EXTRACTION W/ INTRAOCULAR LENS  IMPLANT, BILATERAL  04/2013   Dr. Bing Plume  . CHOLECYSTECTOMY  2004   Dr. Zella Richer  . COLONOSCOPY  2001   Dr. Collene Mares  . fibroidectomy  1950  . [place]  . KYPHOPLASTY  2010   T10 &  T12 Dr. Erline Levine  . MEDIAL PARTIAL KNEE REPLACEMENT Left 2004   Dr. Ronnie Derby  . SPINE SURGERY     vertebroplasty T10, T12  . SPINE SURGERY  11/02/2001    C4-5 & C5-6 Titanium Plate Placed Dr. Carloyn Manner  . THYROIDECTOMY, PARTIAL Left 2007  . TOTAL KNEE ARTHROPLASTY Right 2009   Dr. Alvan Dame     Allergies   Allergen Reactions  . Bactrim     unknown  . Betadine [Povidone Iodine]     unknown  . Metformin And Related     Hurt stomach  . Nutmeg Oil (Myristica Oil)     unknown  . Sulfa Antibiotics     unknown    Allergies as of 05/23/2019      Reactions   Bactrim    unknown   Betadine [povidone Iodine]    unknown   Metformin And Related    Hurt stomach   Nutmeg Oil (myristica Oil)    unknown   Sulfa Antibiotics    unknown      Medication List       Accurate as of May 23, 2019  5:04 PM. If you have any questions, ask your nurse or doctor.        STOP taking these medications   augmented betamethasone dipropionate 0.05 % ointment Commonly known as: DIPROLENE-AF Stopped by: Braelynn Lupton X Chaye Misch, NP   furosemide 20 MG tablet Commonly known as: LASIX Stopped by: Jordell Outten X Evanny Ellerbe, NP     TAKE these medications   amLODipine 2.5 MG tablet Commonly known as: NORVASC Take 2.5 mg by mouth daily. What changed: Another medication with the same name was removed. Continue taking this medication, and follow the directions you see here. Changed by: Coralie Stanke X Tanita Palinkas, NP   aspirin 81 MG EC tablet Take 1 tablet (81 mg total) by mouth daily.   betamethasone dipropionate 0.05 % ointment Commonly known as: DIPROLENE Apply topically. Apply BID PRN to active rash on extremities . 1 app immediately after shower. Twice A Day - PRN   losartan 50 MG tablet Commonly known as: COZAAR TAKE 1 TABLET ONCE DAILY TO CONTROL BLOOD PRESSURE.   metFORMIN 500 MG 24 hr tablet Commonly known as: GLUCOPHAGE-XR Take 250 mg by mouth 2 (two) times daily. Take 1/2 tablet   metoprolol tartrate 25 MG tablet Commonly known as: LOPRESSOR Take 12.5 mg by mouth 2 (two) times daily. Hold Metoprolol for SBP < 110 and DSP <50   mirtazapine 7.5 MG tablet Commonly known as: REMERON Take 7.5 mg by mouth at bedtime.   multivitamins ther. w/minerals Tabs tablet Take 1 tablet by mouth daily. 400 mcg tablet   nitroGLYCERIN  0.4 MG SL tablet Commonly known as: NITROSTAT Place 0.4 mg under the tongue every 5 (five) minutes as needed for chest pain. PLACE 0.4 MG UNDER TONGUE EVERY 5 MINUTES X3 FOR CHEST PAIN, IF NO RELIEF, CALL MD OR SEND TO ED FOR FURTHER EVALUATION.   OPTIVE 0.5-0.9 % ophthalmic solution Generic drug: carboxymethylcellul-glycerin Place 2 drops into the left eye every 8 (eight) hours as needed for dry eyes.       Review of Systems  Constitutional: Negative for activity change, appetite change, chills, diaphoresis, fatigue, fever and unexpected weight change.  HENT: Positive for hearing loss. Negative for congestion and voice change.   Eyes: Negative for visual disturbance.  Respiratory: Negative for cough, shortness of breath and wheezing.   Cardiovascular: Positive for leg swelling.  Gastrointestinal: Negative for abdominal distention,  abdominal pain, constipation, diarrhea, nausea and vomiting.  Genitourinary: Negative for difficulty urinating, dysuria and urgency.  Musculoskeletal: Positive for arthralgias, back pain and gait problem.  Skin: Negative for color change.  Neurological: Negative for dizziness, speech difficulty, weakness and headaches.       Memory lapses.   Psychiatric/Behavioral: Negative for agitation, behavioral problems, confusion, hallucinations and sleep disturbance. The patient is not nervous/anxious.     Immunization History  Administered Date(s) Administered  . Influenza Whole 03/30/2012, 04/13/2013, 04/04/2018  . Influenza, High Dose Seasonal PF 04/02/2019  . Influenza-Unspecified 04/17/2014, 03/29/2015  . Pneumococcal Conjugate-13 12/08/2017  . Pneumococcal Polysaccharide-23 06/30/2001  . Td 06/30/2001, 12/08/2017   Pertinent  Health Maintenance Due  Topic Date Due  . FOOT EXAM  12/21/2015  . OPHTHALMOLOGY EXAM  04/02/2016  . HEMOGLOBIN A1C  11/09/2018  . INFLUENZA VACCINE  Completed  . DEXA SCAN  Completed  . PNA vac Low Risk Adult  Completed    Fall Risk  11/24/2017 07/09/2017 11/01/2015 07/26/2015 06/21/2015  Falls in the past year? No No No No No   Functional Status Survey:    Vitals:   05/23/19 1352  BP: (!) 148/62  Pulse: 82  Resp: 18  Temp: 98.2 F (36.8 C)  SpO2: 97%  Weight: 110 lb 3.2 oz (50 kg)  Height: 5\' 3"  (1.6 m)   Body mass index is 19.52 kg/m. Physical Exam Vitals signs and nursing note reviewed.  Constitutional:      General: She is not in acute distress.    Appearance: Normal appearance. She is normal weight. She is not ill-appearing, toxic-appearing or diaphoretic.  HENT:     Head: Normocephalic and atraumatic.     Nose: Nose normal.     Mouth/Throat:     Mouth: Mucous membranes are moist.  Eyes:     Extraocular Movements: Extraocular movements intact.     Conjunctiva/sclera: Conjunctivae normal.     Pupils: Pupils are equal, round, and reactive to light.  Neck:     Musculoskeletal: Normal range of motion and neck supple.  Cardiovascular:     Rate and Rhythm: Normal rate and regular rhythm.     Heart sounds: No murmur.  Pulmonary:     Breath sounds: No wheezing, rhonchi or rales.  Abdominal:     General: Bowel sounds are normal. There is no distension.     Palpations: Abdomen is soft.     Tenderness: There is no abdominal tenderness. There is no right CVA tenderness, left CVA tenderness, guarding or rebound.  Musculoskeletal:     Right lower leg: Edema present.     Left lower leg: Edema present.     Comments: Trace edema BLE. Kyphoscoliosis.   Skin:    General: Skin is warm and dry.  Neurological:     General: No focal deficit present.     Mental Status: She is alert. Mental status is at baseline.     Motor: No weakness.     Coordination: Coordination normal.     Gait: Gait abnormal.     Comments: Oriented to person, place.   Psychiatric:        Mood and Affect: Mood normal.        Behavior: Behavior normal.        Thought Content: Thought content normal.        Judgment: Judgment  normal.     Labs reviewed: Recent Labs    06/26/18 2343 06/27/18 1015  08/12/18 02/15/19 03/03/19  NA 135  138   < > 139 138 139  K 4.0 4.0   < > 4.2 4.7 4.2  CL 102 102   < > 102 100 105  CO2 24 27  --  29 28 28   GLUCOSE 265* 243*  --   --   --   --   BUN 16 12   < > 16 21 18   CREATININE 0.78 0.76   < > 0.9 0.9 1.0  CALCIUM 10.2 10.5*   < > 10.8 11.0 11.1   < > = values in this interval not displayed.   Recent Labs    06/26/18 2343 07/22/18 02/15/19  AST 23 15 21   ALT 16 12 17   ALKPHOS 54 62 46  BILITOT 0.6  --   --   PROT 5.7* 6.0 6.0  ALBUMIN 3.4* 3.7 3.9   Recent Labs    06/26/18 2343 06/28/18 0200 07/22/18 08/12/18  WBC 10.3 9.7 10.7 9.3  HGB 11.3* 10.7* 11.1* 12.4  HCT 34.5* 32.4* 33* 37  MCV 94.5 93.1  --   --   PLT 234 236 284 284   Lab Results  Component Value Date   TSH 0.45 08/13/2017   Lab Results  Component Value Date   HGBA1C 6.7 05/11/2018   Lab Results  Component Value Date   CHOL 164 02/11/2019   HDL 53 02/11/2019   LDLCALC 97 02/11/2019   TRIG 59 02/11/2019   CHOLHDL 2.4 06/27/2018    Significant Diagnostic Results in last 30 days:  No results found.  Assessment/Plan DM type 2 with diabetic peripheral neuropathy (HCC) Blood sugar is controlled, continue Metformin 250mg  bid. Update Hgb a1c. Due for eye exam-Ophthalmology, foot exam-Podiatry.   Essential hypertension Blood pressure is controlled, continue Losartan 50mg  qd, Amlodipine 2.5mg  qd, ASA 81mg  qd.   Depression with anxiety Her mood, weight are stable, continue Mirtazapine 7.5mg  qd.      Family/ staff Communication: plan of care reviewed with the patient and charge nurse.   Labs/tests ordered:  Hgb a1c   Time spend 40 minutes.

## 2019-05-23 NOTE — Assessment & Plan Note (Addendum)
Blood sugar is controlled, continue Metformin 250mg  bid. Update Hgb a1c. Due for eye exam-Ophthalmology, foot exam-Podiatry.

## 2019-05-24 DIAGNOSIS — E1142 Type 2 diabetes mellitus with diabetic polyneuropathy: Secondary | ICD-10-CM | POA: Diagnosis not present

## 2019-05-25 LAB — HEMOGLOBIN A1C: Hemoglobin A1C: 6.5

## 2019-06-08 DIAGNOSIS — I10 Essential (primary) hypertension: Secondary | ICD-10-CM | POA: Diagnosis not present

## 2019-06-08 DIAGNOSIS — I2510 Atherosclerotic heart disease of native coronary artery without angina pectoris: Secondary | ICD-10-CM | POA: Diagnosis not present

## 2019-06-08 DIAGNOSIS — I340 Nonrheumatic mitral (valve) insufficiency: Secondary | ICD-10-CM | POA: Diagnosis not present

## 2019-06-08 DIAGNOSIS — E119 Type 2 diabetes mellitus without complications: Secondary | ICD-10-CM | POA: Diagnosis not present

## 2019-06-14 DIAGNOSIS — Z20828 Contact with and (suspected) exposure to other viral communicable diseases: Secondary | ICD-10-CM | POA: Diagnosis not present

## 2019-06-21 DIAGNOSIS — Z20828 Contact with and (suspected) exposure to other viral communicable diseases: Secondary | ICD-10-CM | POA: Diagnosis not present

## 2019-07-13 DIAGNOSIS — Z20828 Contact with and (suspected) exposure to other viral communicable diseases: Secondary | ICD-10-CM | POA: Diagnosis not present

## 2019-07-20 DIAGNOSIS — Z20828 Contact with and (suspected) exposure to other viral communicable diseases: Secondary | ICD-10-CM | POA: Diagnosis not present

## 2019-07-27 DIAGNOSIS — Z20828 Contact with and (suspected) exposure to other viral communicable diseases: Secondary | ICD-10-CM | POA: Diagnosis not present

## 2019-07-30 DIAGNOSIS — Z23 Encounter for immunization: Secondary | ICD-10-CM | POA: Diagnosis not present

## 2019-08-03 DIAGNOSIS — Z20828 Contact with and (suspected) exposure to other viral communicable diseases: Secondary | ICD-10-CM | POA: Diagnosis not present

## 2019-08-10 DIAGNOSIS — Z20828 Contact with and (suspected) exposure to other viral communicable diseases: Secondary | ICD-10-CM | POA: Diagnosis not present

## 2019-08-17 DIAGNOSIS — Z20828 Contact with and (suspected) exposure to other viral communicable diseases: Secondary | ICD-10-CM | POA: Diagnosis not present

## 2019-08-30 ENCOUNTER — Non-Acute Institutional Stay: Payer: Medicare Other | Admitting: Internal Medicine

## 2019-08-30 ENCOUNTER — Encounter: Payer: Self-pay | Admitting: Internal Medicine

## 2019-08-30 DIAGNOSIS — E1142 Type 2 diabetes mellitus with diabetic polyneuropathy: Secondary | ICD-10-CM

## 2019-08-30 DIAGNOSIS — E213 Hyperparathyroidism, unspecified: Secondary | ICD-10-CM | POA: Diagnosis not present

## 2019-08-30 DIAGNOSIS — I10 Essential (primary) hypertension: Secondary | ICD-10-CM

## 2019-08-30 DIAGNOSIS — R4189 Other symptoms and signs involving cognitive functions and awareness: Secondary | ICD-10-CM | POA: Diagnosis not present

## 2019-08-30 NOTE — Progress Notes (Signed)
Location:   [facility] Room Number: [place]:  ALF [phone]) Provider:  Veleta Miners MD  Mast, Man X, NP  Patient Care Team: Mast, Man X, NP as PCP - General (Internal Medicine) [place] Mast, Man X, NP as Nurse Practitioner (Nurse Practitioner) Jacelyn Pi, MD as Consulting Physician (Endocrinology) Calvert Cantor, MD as Consulting Physician (Ophthalmology) Vickey Huger, MD as Consulting Physician (Orthopedic Surgery) Suella Broad, MD as Consulting Physician (Physical Medicine and Rehabilitation) Erline Levine, MD as Consulting Physician (Neurosurgery) Juanita Craver, MD as Consulting Physician (Gastroenterology) Martinique, Peter M, MD as Consulting Physician (Cardiology) Megan Salon, MD as Consulting Physician (Gynecology)  Extended Emergency Contact Information Primary Emergency Contact: [name], [place] Phone: [phone] Relation: Friend Secondary Emergency Contact: Edmon Crape Mobile Phone: [phone] Relation: Other Preferred language: Cleophus Molt Interpreter needed? No Guardian: Meryl Dare Mobile Phone: [phone] Relation: Legal Guardian Preferred language: English Interpreter needed? No  Code Status:  DNR Goals of care: Advanced Directive information Advanced Directives 08/30/2019  Does Patient Have a Medical Advance Directive? Yes  Type of Advance Directive Out of facility DNR (pink MOST or yellow form);Living will;Healthcare Power of Attorney  Does patient want to make changes to medical advance directive? No - Patient declined  Copy of [place] in Chart? Yes - validated most recent copy scanned in chart (See row information)  Would patient like information on creating a medical advance directive? -  Pre-existing out of facility DNR order (yellow form or pink MOST form) Yellow form placed in chart (order not valid for inpatient use)       Chief Complaint  Patient presents with  . Medical Management of Chronic Issues  . Health Maintenance    Foot and eye exam     HPI:  Pt is a 86 y.o. female seen today for medical management of chronic diseases.   Patient is Resident of the ALF here in [facility].  Patient has h/o Hypertension, Diabetes Mellitus Type 2 , and Cognitive impairnement. She also has h/o NSTEMI in 12/19 managed Conservatively with EF of 55% Also has h/o Allergic Dermatitis  No New Complains. Has gained 10 lbs . Walks with walker. No Recent falls. Mood is stable No Nursing issues Her POA is in [place]    Past Medical History:  Diagnosis Date  . Contact dermatitis and other eczema, due to unspecified cause   . Cortical senile cataract   . GERD (gastroesophageal reflux disease) 02/17/2013  . Hyperparathyroidism, unspecified ([place]) 03/26/2010  . Irritable bowel syndrome   . Keratoconjunctivitis sicca, not specified as Sjogren's   . Loss of weight   . Macular degeneration (senile) of retina, unspecified   . Memory loss   . Other and unspecified hyperlipidemia   . Other malaise and fatigue 06/17/2010  . Pain in joint, site unspecified   . Pathologic fracture of vertebrae   . Reflux esophagitis   . Scoliosis (and kyphoscoliosis), idiopathic   . Senile osteoporosis   . Type II or unspecified type diabetes mellitus without mention of complication, uncontrolled   . Unspecified essential hypertension   . Unspecified hereditary and idiopathic peripheral neuropathy   . Unspecified vitamin D deficiency    Past Surgical History:  Procedure Laterality Date  . APPENDECTOMY    . BREAST LUMPECTOMY  02/05/2010   Dr. Christie Beckers  . CATARACT EXTRACTION W/ INTRAOCULAR LENS  IMPLANT, BILATERAL  04/2013   Dr. Bing Plume  . CHOLECYSTECTOMY  2004   Dr. Zella Richer  . COLONOSCOPY  2001   Dr. Collene Mares  . fibroidectomy  1950  . [place]  . KYPHOPLASTY  2010   T10 & T12 Dr. Erline Levine  . MEDIAL PARTIAL  KNEE REPLACEMENT Left 2004   Dr. Ronnie Derby  . SPINE SURGERY     vertebroplasty T10, T12  . SPINE SURGERY  11/02/2001    C4-5 & C5-6 Titanium Plate Placed Dr. Carloyn Manner  . THYROIDECTOMY, PARTIAL Left 2007  . TOTAL KNEE ARTHROPLASTY Right 2009   Dr. Alvan Dame     Allergies  Allergen Reactions  . Bactrim     unknown  . Betadine [Povidone Iodine]     unknown  . Metformin And Related     Hurt stomach  . Nutmeg Oil (Myristica Oil)     unknown  . Sulfa Antibiotics     unknown    Allergies as of 08/30/2019      Reactions   Bactrim    unknown   Betadine [povidone Iodine]    unknown   Metformin And Related    Hurt stomach   Nutmeg Oil (myristica Oil)    unknown   Sulfa Antibiotics    unknown      Medication List       Accurate as of August 30, 2019  9:07 AM. If you have any questions, ask your nurse or doctor.        amLODipine 2.5 MG tablet Commonly known as: NORVASC Take 2.5 mg by mouth daily.   aspirin 81 MG EC tablet Take 1 tablet (81 mg total) by mouth daily.   betamethasone dipropionate 0.05 % ointment Commonly known as: DIPROLENE Apply topically. Apply BID PRN to active rash on extremities . 1 app immediately after shower. Twice A Day - PRN   losartan 50 MG tablet Commonly known as: COZAAR TAKE 1 TABLET ONCE DAILY TO CONTROL BLOOD PRESSURE.   metFORMIN 500 MG 24 hr tablet Commonly known as: GLUCOPHAGE-XR Take 250 mg by mouth 2 (two) times daily. Take 1/2 tablet   metoprolol tartrate 25 MG tablet Commonly known as: LOPRESSOR Take 12.5 mg by mouth 2 (two) times daily. Hold Metoprolol for SBP < 110 and DSP <50   mirtazapine 7.5 MG tablet Commonly known as: REMERON Take 7.5 mg by mouth at bedtime.   multivitamins ther. w/minerals Tabs tablet Take 1 tablet by mouth daily. 400 mcg tablet   nitroGLYCERIN 0.4 MG SL tablet Commonly known as: NITROSTAT Place 0.4 mg under the tongue every 5 (five) minutes as needed for chest pain. PLACE 0.4 MG UNDER TONGUE EVERY 5 MINUTES  X3 FOR CHEST PAIN, IF NO RELIEF, CALL MD OR SEND TO ED FOR FURTHER EVALUATION.   OPTIVE 0.5-0.9 % ophthalmic solution Generic drug: carboxymethylcellul-glycerin Place 2 drops into the left eye every 8 (eight) hours as needed for dry eyes.       Review of Systems  Review of Systems  Constitutional: Negative for activity change, appetite change, chills, diaphoresis, fatigue and fever.  HENT: Negative for mouth sores, postnasal drip, rhinorrhea, sinus pain and sore throat.   Respiratory: Negative for apnea, cough, chest tightness, shortness of breath and wheezing.   Cardiovascular: Negative for chest pain, palpitations and leg swelling.  Gastrointestinal: Negative for abdominal distention, abdominal pain, constipation, diarrhea, nausea and vomiting.  Genitourinary: Negative for dysuria and frequency.  Musculoskeletal: Negative for arthralgias, joint swelling and myalgias.  Skin: Positive for  Rash Neurological: Negative for dizziness, syncope, weakness, light-headedness and numbness.  Psychiatric/Behavioral: Negative for behavioral problems,and sleep disturbance. Positive for COnfusion    Immunization History  Administered Date(s) Administered  . Influenza Whole 03/30/2012, 04/13/2013, 04/04/2018  . Influenza, High Dose Seasonal PF 04/02/2019  . Influenza-Unspecified 04/17/2014, 03/29/2015  . Moderna SARS-COVID-2 Vaccination 07/02/2019, 07/30/2019  . Pneumococcal Conjugate-13 12/08/2017  . Pneumococcal Polysaccharide-23 06/30/2001  . Td 06/30/2001, 12/08/2017   Pertinent  Health Maintenance Due  Topic Date Due  . FOOT EXAM  12/21/2015  . OPHTHALMOLOGY EXAM  04/02/2016  . HEMOGLOBIN A1C  11/22/2019  . INFLUENZA VACCINE  Completed  . DEXA SCAN  Completed  . PNA vac Low Risk Adult  Completed   Fall Risk  11/24/2017 07/09/2017 11/01/2015 07/26/2015 06/21/2015  Falls in the past year? No No No No No   Functional Status Survey:    Vitals:   08/30/19 0904  BP: (!) 160/70  Pulse:  70  Resp: 16  Temp: 98 F (36.7 C)  SpO2: 98%  Weight: 110 lb 9.6 oz (50.2 kg)  Height: 5\' 3"  (1.6 m)   Body mass index is 19.59 kg/m. Physical Exam  Constitutional:  Well-developed and well-nourished.  HENT:  Head: Normocephalic.  Mouth/Throat: Oropharynx is clear and moist.  Eyes: Pupils are equal, round, and reactive to light.  Neck: Neck supple.  Cardiovascular: Normal rate and normal heart sounds.  No murmur heard. Pulmonary/Chest: Effort normal and breath sounds normal. No respiratory distress. No wheezes. She has no rales.  Abdominal: Soft. Bowel sounds are normal. No distension. There is no tenderness. There is no rebound.  Musculoskeletal: No edema.  Lymphadenopathy: none Neurological:No Focal Deficits Does have Cognitive Impairment But responds Appropriately  .Skin: Skin is warm and dry.  Psychiatric: Normal mood and affect. Behavior is normal. Thought content normal.    Labs reviewed: Recent Labs    02/15/19 0000 03/03/19 0000  NA 138 139  K 4.7 4.2  CL 100 105  CO2 28 28  BUN 21 18  CREATININE 0.9 1.0  CALCIUM 11.0 11.1   Recent Labs    02/15/19 0000  AST 21  ALT 17  ALKPHOS 46  PROT 6.0  ALBUMIN 3.9   No results for input(s): WBC, NEUTROABS, HGB, HCT, MCV, PLT in the last 8760 hours. Lab Results  Component Value Date   TSH 0.45 08/13/2017   Lab Results  Component Value Date   HGBA1C 6.5 05/25/2019   Lab Results  Component Value Date   CHOL 164 02/11/2019   HDL 53 02/11/2019   LDLCALC 97 02/11/2019   TRIG 59 02/11/2019   CHOLHDL 2.4 06/27/2018    Significant Diagnostic Results in last 30 days:  No results found.  Assessment/Plan  CAD On Aspirin and Losartan and Beta blocker No Chest Pian Recent EF showed Normal EF with no Wall motion abnormality Essential hypertension BP mildily high today Will check it QD for 2 weeks to see the Pattern  Weight loss Doing really well on Low dose of Remeron  Cognitive impairment Check  MMSE  Non Specific Rash Does well on Diprolene PRN  DM Type 2  CBG Q Weekly BS mostly Less then 200 Repeat A1C  LE edema Very Mild Hyperparathyroidism with Mild Hypercalcemia Calcium 11.1 Had Work up before  Lasix Discontinued Repeat CMP  Family/ staff Communication:   Labs/tests ordered:  CBC,CMP,A1C, Lipid panel

## 2019-09-01 DIAGNOSIS — E1142 Type 2 diabetes mellitus with diabetic polyneuropathy: Secondary | ICD-10-CM | POA: Diagnosis not present

## 2019-09-01 DIAGNOSIS — E118 Type 2 diabetes mellitus with unspecified complications: Secondary | ICD-10-CM | POA: Diagnosis not present

## 2019-09-01 DIAGNOSIS — I10 Essential (primary) hypertension: Secondary | ICD-10-CM | POA: Diagnosis not present

## 2019-09-02 LAB — BASIC METABOLIC PANEL
BUN: 19 (ref 4–21)
CO2: 28 — AB (ref 13–22)
Chloride: 104 (ref 99–108)
Creatinine: 0.8 (ref 0.5–1.1)
Glucose: 119
Potassium: 4.4 (ref 3.4–5.3)
Sodium: 138 (ref 137–147)

## 2019-09-02 LAB — HEPATIC FUNCTION PANEL
ALT: 12 (ref 7–35)
AST: 20 (ref 13–35)
Alkaline Phosphatase: 68 (ref 25–125)
Bilirubin, Total: 0.5

## 2019-09-02 LAB — COMPREHENSIVE METABOLIC PANEL
Albumin: 3.7 (ref 3.5–5.0)
Calcium: 10.8 — AB (ref 8.7–10.7)
Globulin: 22

## 2019-09-02 LAB — CBC AND DIFFERENTIAL
HCT: 36 (ref 36–46)
Hemoglobin: 12 (ref 12.0–16.0)
Neutrophils Absolute: 3969
Platelets: 262 (ref 150–399)
WBC: 8.8

## 2019-09-02 LAB — CBC: RBC: 3.97 (ref 3.87–5.11)

## 2019-09-02 LAB — HEMOGLOBIN A1C: Hemoglobin A1C: 6.8

## 2019-09-27 DIAGNOSIS — I340 Nonrheumatic mitral (valve) insufficiency: Secondary | ICD-10-CM | POA: Diagnosis not present

## 2019-09-27 DIAGNOSIS — E119 Type 2 diabetes mellitus without complications: Secondary | ICD-10-CM | POA: Diagnosis not present

## 2019-09-27 DIAGNOSIS — I10 Essential (primary) hypertension: Secondary | ICD-10-CM | POA: Diagnosis not present

## 2019-09-27 DIAGNOSIS — I2510 Atherosclerotic heart disease of native coronary artery without angina pectoris: Secondary | ICD-10-CM | POA: Diagnosis not present

## 2019-10-13 ENCOUNTER — Non-Acute Institutional Stay: Payer: Medicare Other | Admitting: Nurse Practitioner

## 2019-10-13 ENCOUNTER — Encounter: Payer: Self-pay | Admitting: Nurse Practitioner

## 2019-10-13 DIAGNOSIS — F418 Other specified anxiety disorders: Secondary | ICD-10-CM | POA: Diagnosis not present

## 2019-10-13 DIAGNOSIS — R609 Edema, unspecified: Secondary | ICD-10-CM | POA: Diagnosis not present

## 2019-10-13 DIAGNOSIS — I10 Essential (primary) hypertension: Secondary | ICD-10-CM

## 2019-10-13 DIAGNOSIS — E1142 Type 2 diabetes mellitus with diabetic polyneuropathy: Secondary | ICD-10-CM

## 2019-10-13 NOTE — Assessment & Plan Note (Signed)
Hgb a1c 6.5 05/24/19, continue Metformin.

## 2019-10-13 NOTE — Assessment & Plan Note (Signed)
Her mood is stable, continue Mirtazapine 7.5mg qd.  

## 2019-10-13 NOTE — Assessment & Plan Note (Signed)
06/08/19 echocardiogram EF 50-55% No noted cough, SOB, orthopnea, or O2 desaturation, no pain in calves or with feet dorsiflexion, no redness or warmth, will apply TED on in am, off in pm. Observe.

## 2019-10-13 NOTE — Progress Notes (Signed)
Location:    [facility] Room Number: [id number] Place of Service:  ALF (13) Provider: Marlana Latus NP  Sherie Dobrowolski X, NP  Patient Care Team: Andilynn Delavega X, NP as PCP - General (Internal Medicine) [place] Saphronia Ozdemir X, NP as Nurse Practitioner (Nurse Practitioner) Jacelyn Pi, MD as Consulting Physician (Endocrinology) Calvert Cantor, MD as Consulting Physician (Ophthalmology) Vickey Huger, MD as Consulting Physician (Orthopedic Surgery) Suella Broad, MD as Consulting Physician (Physical Medicine and Rehabilitation) Erline Levine, MD as Consulting Physician (Neurosurgery) Juanita Craver, MD as Consulting Physician (Gastroenterology) Martinique, Peter M, MD as Consulting Physician (Cardiology) Megan Salon, MD as Consulting Physician (Gynecology)  Extended Emergency Contact Information Primary Emergency Contact: [name], [place] of [place] Phone: [phone] Relation: Friend Secondary Emergency Contact: Edmon Crape Mobile Phone: [phone] Relation: Other Preferred language: Cleophus Molt Interpreter needed? No Guardian: Meryl Dare Mobile Phone: [phone] Relation: Legal Guardian Preferred language: English Interpreter needed? No  Code Status: DNR Goals of care: Advanced Directive information Advanced Directives 08/30/2019  Does Patient Have a Medical Advance Directive? Yes  Type of Advance Directive Out of facility DNR (pink MOST or yellow form);Living will;Healthcare Power of Attorney  Does patient want to make changes to medical advance directive? No - Patient declined  Copy of [place] in Chart? Yes - validated most recent copy scanned in chart (See row information)  Would patient like information on creating a medical advance directive? -  Pre-existing out of facility DNR order (yellow form or pink MOST form) Yellow form placed in chart (order not valid for inpatient use)      Chief Complaint  Patient presents with  . Acute Visit    HPI:  Pt is a 86 y.o. female seen today for an acute visit for reported swelling in legs, L>R, not new, weights are stable, #110 05/2019, #110 08/30/19. No pain in calves or with feet dorsiflexion. HTN, blood pressure is controlled, on Losartan, Metoprolol, Amlodipine. T2DM, blood sugar is controlled, on Metformin, her mood is stable, on Mirtazapine 7.5mg  qd.    Past Medical History:  Diagnosis Date  . Contact dermatitis and other eczema, due to unspecified cause   . Cortical senile cataract   . GERD (gastroesophageal reflux disease) 02/17/2013  . Hyperparathyroidism, unspecified ([place]) 03/26/2010  . Irritable bowel syndrome   . Keratoconjunctivitis sicca, not specified as Sjogren's   . Loss of weight   . Macular degeneration (senile) of retina, unspecified   . Memory loss   . Other and unspecified hyperlipidemia   . Other malaise and fatigue 06/17/2010  . Pain in joint, site unspecified   . Pathologic fracture of vertebrae   . Reflux esophagitis   . Scoliosis (and kyphoscoliosis), idiopathic   . Senile osteoporosis   . Type II or unspecified type diabetes mellitus without mention of complication, uncontrolled   . Unspecified essential hypertension   . Unspecified hereditary and idiopathic peripheral neuropathy   . Unspecified vitamin D deficiency    Past Surgical History:  Procedure Laterality Date  . APPENDECTOMY    . BREAST LUMPECTOMY  02/05/2010   Dr. Christie Beckers  . CATARACT EXTRACTION W/ INTRAOCULAR LENS  IMPLANT, BILATERAL  04/2013   Dr. Bing Plume  . CHOLECYSTECTOMY  2004   Dr. Zella Richer  . COLONOSCOPY  2001   Dr. Collene Mares  . fibroidectomy  1950  . [place]  . KYPHOPLASTY  2010  T10 & T12 Dr. Erline Levine  . MEDIAL PARTIAL KNEE REPLACEMENT Left 2004   Dr. Ronnie Derby  . SPINE SURGERY     vertebroplasty T10, T12  . SPINE SURGERY  11/02/2001    C4-5 & C5-6 Titanium Plate Placed Dr. Carloyn Manner  .  THYROIDECTOMY, PARTIAL Left 2007  . TOTAL KNEE ARTHROPLASTY Right 2009   Dr. Alvan Dame     Allergies  Allergen Reactions  . Bactrim     unknown  . Betadine [Povidone Iodine]     unknown  . Metformin And Related     Hurt stomach  . Nutmeg Oil (Myristica Oil)     unknown  . Sulfa Antibiotics     unknown    Allergies as of 10/13/2019      Reactions   Bactrim    unknown   Betadine [povidone Iodine]    unknown   Metformin And Related    Hurt stomach   Nutmeg Oil (myristica Oil)    unknown   Sulfa Antibiotics    unknown      Medication List       Accurate as of October 13, 2019 11:59 PM. If you have any questions, ask your nurse or doctor.        amLODipine 5 MG tablet Commonly known as: NORVASC Take 5 mg by mouth daily.   aspirin 81 MG EC tablet Take 1 tablet (81 mg total) by mouth daily.   betamethasone dipropionate 0.05 % ointment Commonly known as: DIPROLENE Apply topically. Apply BID PRN to active rash on extremities . 1 app immediately after shower. Twice A Day - PRN   losartan 100 MG tablet Commonly known as: COZAAR Take 100 mg by mouth daily. What changed: Another medication with the same name was removed. Continue taking this medication, and follow the directions you see here. Changed by: Mubashir Mallek X Talisha Erby, NP   metFORMIN 500 MG 24 hr tablet Commonly known as: GLUCOPHAGE-XR Take 250 mg by mouth 2 (two) times daily. Take 1/2 tablet   metoprolol tartrate 25 MG tablet Commonly known as: LOPRESSOR Take 12.5 mg by mouth 2 (two) times daily. Hold Metoprolol for SBP < 110 and DSP <50   mirtazapine 7.5 MG tablet Commonly known as: REMERON Take 7.5 mg by mouth at bedtime.   multivitamins ther. w/minerals Tabs tablet Take 1 tablet by mouth daily. 400 mcg tablet   nitroGLYCERIN 0.4 MG SL tablet Commonly known as: NITROSTAT Place 0.4 mg under the tongue every 5 (five) minutes as needed for chest pain. PLACE 0.4 MG UNDER TONGUE EVERY 5 MINUTES X3 FOR CHEST PAIN,  IF NO RELIEF, CALL MD OR SEND TO ED FOR FURTHER EVALUATION.   OPTIVE 0.5-0.9 % ophthalmic solution Generic drug: carboxymethylcellul-glycerin Place 2 drops into the left eye every 8 (eight) hours as needed for dry eyes.       Review of Systems  Constitutional: Negative for activity change, appetite change, fatigue, fever and unexpected weight change.  HENT: Positive for hearing loss. Negative for congestion and voice change.   Eyes: Negative for visual disturbance.  Respiratory: Negative for cough, shortness of breath and wheezing.   Cardiovascular: Positive for leg swelling.  Gastrointestinal: Negative for abdominal distention, abdominal pain and constipation.  Genitourinary: Negative for difficulty urinating, dysuria and urgency.  Musculoskeletal: Positive for arthralgias, back pain and gait problem.  Skin: Negative for color change.  Neurological: Negative for dizziness, weakness and headaches.       Memory lapses.   Psychiatric/Behavioral: Negative for agitation,  behavioral problems and sleep disturbance.    Immunization History  Administered Date(s) Administered  . Influenza Whole 03/30/2012, 04/13/2013, 04/04/2018  . Influenza, High Dose Seasonal PF 04/02/2019  . Influenza-Unspecified 04/17/2014, 03/29/2015  . Moderna SARS-COVID-2 Vaccination 07/02/2019, 07/30/2019  . Pneumococcal Conjugate-13 12/08/2017  . Pneumococcal Polysaccharide-23 06/30/2001  . Td 06/30/2001, 12/08/2017   Pertinent  Health Maintenance Due  Topic Date Due  . FOOT EXAM  12/21/2015  . OPHTHALMOLOGY EXAM  04/02/2016  . HEMOGLOBIN A1C  11/22/2019  . INFLUENZA VACCINE  01/29/2020  . DEXA SCAN  Completed  . PNA vac Low Risk Adult  Completed   Fall Risk  11/24/2017 07/09/2017 11/01/2015 07/26/2015 06/21/2015  Falls in the past year? No No No No No   Functional Status Survey:    Vitals:   10/14/19 1148  BP: 130/60  Pulse: 65  Resp: 18  Temp: 98 F (36.7 C)  SpO2: 93%  Weight: 113 lb (51.3 kg)   Height: 5\' 3"  (1.6 m)   Body mass index is 20.02 kg/m. Physical Exam Vitals and nursing note reviewed.  Constitutional:      Appearance: Normal appearance. She is normal weight.  HENT:     Head: Normocephalic and atraumatic.     Mouth/Throat:     Mouth: Mucous membranes are moist.  Eyes:     Extraocular Movements: Extraocular movements intact.     Conjunctiva/sclera: Conjunctivae normal.     Pupils: Pupils are equal, round, and reactive to light.  Cardiovascular:     Rate and Rhythm: Normal rate and regular rhythm.     Heart sounds: No murmur.  Pulmonary:     Breath sounds: No rales.  Abdominal:     General: Bowel sounds are normal. There is no distension.     Palpations: Abdomen is soft.     Tenderness: There is no abdominal tenderness.  Musculoskeletal:     Cervical back: Normal range of motion and neck supple.     Right lower leg: Edema present.     Left lower leg: Edema present.     Comments: Trace edema RLE, 1+ LLE. Kyphoscoliosis.   Skin:    General: Skin is warm and dry.  Neurological:     General: No focal deficit present.     Mental Status: She is alert. Mental status is at baseline.     Motor: No weakness.     Coordination: Coordination normal.     Gait: Gait abnormal.     Comments: Oriented to person, place.   Psychiatric:        Mood and Affect: Mood normal.        Behavior: Behavior normal.        Thought Content: Thought content normal.        Judgment: Judgment normal.     Labs reviewed: Recent Labs    02/15/19 0000 03/03/19 0000  NA 138 139  K 4.7 4.2  CL 100 105  CO2 28 28  BUN 21 18  CREATININE 0.9 1.0  CALCIUM 11.0 11.1   Recent Labs    02/15/19 0000  AST 21  ALT 17  ALKPHOS 46  PROT 6.0  ALBUMIN 3.9   No results for input(s): WBC, NEUTROABS, HGB, HCT, MCV, PLT in the last 8760 hours. Lab Results  Component Value Date   TSH 0.45 08/13/2017   Lab Results  Component Value Date   HGBA1C 6.5 05/25/2019   Lab Results   Component Value Date   CHOL 164 02/11/2019  HDL 53 02/11/2019   LDLCALC 97 02/11/2019   TRIG 59 02/11/2019   CHOLHDL 2.4 06/27/2018    Significant Diagnostic Results in last 30 days:  No results found.  Assessment/Plan: Edema 06/08/19 echocardiogram EF 50-55% No noted cough, SOB, orthopnea, or O2 desaturation, no pain in calves or with feet dorsiflexion, no redness or warmth, will apply TED on in am, off in pm. Observe.   Essential hypertension Blood pressure is controlled, continue Losartan, Metoprolol, Amlodipine.   DM type 2 with diabetic peripheral neuropathy (HCC) Hgb a1c 6.5 05/24/19, continue Metformin.   Depression with anxiety Her mood is stable, continue Mirtazapine 7.5 mg qd.     Family/ staff Communication: plan of care reviewed with the patient and charge   Labs/tests ordered: none  Time spend 40 minutes.

## 2019-10-13 NOTE — Assessment & Plan Note (Signed)
Blood pressure is controlled, continue Losartan, Metoprolol, Amlodipine.

## 2019-10-14 ENCOUNTER — Encounter: Payer: Self-pay | Admitting: Nurse Practitioner

## 2019-10-26 DIAGNOSIS — I2510 Atherosclerotic heart disease of native coronary artery without angina pectoris: Secondary | ICD-10-CM | POA: Diagnosis not present

## 2019-10-26 DIAGNOSIS — I10 Essential (primary) hypertension: Secondary | ICD-10-CM | POA: Diagnosis not present

## 2019-10-26 DIAGNOSIS — I340 Nonrheumatic mitral (valve) insufficiency: Secondary | ICD-10-CM | POA: Diagnosis not present

## 2019-10-26 DIAGNOSIS — E119 Type 2 diabetes mellitus without complications: Secondary | ICD-10-CM | POA: Diagnosis not present

## 2019-11-08 DIAGNOSIS — I10 Essential (primary) hypertension: Secondary | ICD-10-CM | POA: Diagnosis not present

## 2019-11-08 DIAGNOSIS — I2510 Atherosclerotic heart disease of native coronary artery without angina pectoris: Secondary | ICD-10-CM | POA: Diagnosis not present

## 2019-11-08 DIAGNOSIS — I340 Nonrheumatic mitral (valve) insufficiency: Secondary | ICD-10-CM | POA: Diagnosis not present

## 2019-11-08 DIAGNOSIS — E119 Type 2 diabetes mellitus without complications: Secondary | ICD-10-CM | POA: Diagnosis not present

## 2019-12-01 ENCOUNTER — Encounter: Payer: Self-pay | Admitting: Nurse Practitioner

## 2019-12-01 ENCOUNTER — Non-Acute Institutional Stay: Payer: Medicare Other | Admitting: Nurse Practitioner

## 2019-12-01 DIAGNOSIS — R609 Edema, unspecified: Secondary | ICD-10-CM

## 2019-12-01 DIAGNOSIS — F418 Other specified anxiety disorders: Secondary | ICD-10-CM | POA: Diagnosis not present

## 2019-12-01 DIAGNOSIS — E1142 Type 2 diabetes mellitus with diabetic polyneuropathy: Secondary | ICD-10-CM

## 2019-12-01 DIAGNOSIS — R413 Other amnesia: Secondary | ICD-10-CM | POA: Diagnosis not present

## 2019-12-01 DIAGNOSIS — I10 Essential (primary) hypertension: Secondary | ICD-10-CM

## 2019-12-01 NOTE — Progress Notes (Signed)
Location:   [facility] Room Number: [id number] Place of Service:  ALF (13) Provider:  Yaser Harvill, Lennie Odor NP   Zianne Schubring X, NP  Patient Care Team: Ballard Budney X, NP as PCP - General (Internal Medicine) [place] Naven Giambalvo X, NP as Nurse Practitioner (Nurse Practitioner) Jacelyn Pi, MD as Consulting Physician (Endocrinology) Calvert Cantor, MD as Consulting Physician (Ophthalmology) Vickey Huger, MD as Consulting Physician (Orthopedic Surgery) Suella Broad, MD as Consulting Physician (Physical Medicine and Rehabilitation) Erline Levine, MD as Consulting Physician (Neurosurgery) Juanita Craver, MD as Consulting Physician (Gastroenterology) Martinique, Peter M, MD as Consulting Physician (Cardiology) Megan Salon, MD as Consulting Physician (Gynecology)  Extended Emergency Contact Information Primary Emergency Contact: [name], [place] of [place] Phone: [phone] Relation: Friend Secondary Emergency Contact: Edmon Crape Mobile Phone: [phone] Relation: Other Preferred language: Cleophus Molt Interpreter needed? No Guardian: Meryl Dare Mobile Phone: [phone] Relation: Legal Guardian Preferred language: English Interpreter needed? No  Code Status:  DNR Goals of care: Advanced Directive information Advanced Directives 12/01/2019  Does Patient Have a Medical Advance Directive? Yes  Type of Paramedic of [place];Living will;Out of facility DNR (pink MOST or yellow form)  Does patient want to make changes to medical advance directive? No - Patient declined  Copy of [place] in Chart? Yes - validated most recent copy scanned in chart (See row information)  Would patient like information on creating a medical advance directive? -  Pre-existing out of facility DNR order (yellow form or pink MOST form) Yellow form placed in chart (order not valid for inpatient use)      Chief Complaint  Patient presents with  . Medical Management of Chronic Issues    HPI:  Pt is a 86 y.o. female seen today for medical management of chronic diseases.    The patient has Hx of HTN, blood pressure is controlled, on Losartan, Metoprolol, Amlodipine. T2DM, blood sugar is controlled, on Metformin, her mood is stable, on Mirtazapine 7.5mg  qd. CHF, compensated on Furosemide 20mg  qd 5x/wk.   Past Medical History:  Diagnosis Date  . Contact dermatitis and other eczema, due to unspecified cause   . Cortical senile cataract   . GERD (gastroesophageal reflux disease) 02/17/2013  . Hyperparathyroidism, unspecified ([place]) 03/26/2010  . Irritable bowel syndrome   . Keratoconjunctivitis sicca, not specified as Sjogren's   . Loss of weight   . Macular degeneration (senile) of retina, unspecified   . Memory loss   . Other and unspecified hyperlipidemia   . Other malaise and fatigue 06/17/2010  . Pain in joint, site unspecified   . Pathologic fracture of vertebrae   . Reflux esophagitis   . Scoliosis (and kyphoscoliosis), idiopathic   . Senile osteoporosis   . Type II or unspecified type diabetes mellitus without mention of complication, uncontrolled   . Unspecified essential hypertension   . Unspecified hereditary and idiopathic peripheral neuropathy   . Unspecified vitamin D deficiency    Past Surgical History:  Procedure Laterality Date  . APPENDECTOMY    . BREAST LUMPECTOMY  02/05/2010   Dr. Christie Beckers  . CATARACT EXTRACTION W/ INTRAOCULAR LENS  IMPLANT, BILATERAL  04/2013   Dr. Bing Plume  . CHOLECYSTECTOMY  2004   Dr. Zella Richer  . COLONOSCOPY  2001   Dr. Collene Mares  . fibroidectomy  1950  . [facility]  . KYPHOPLASTY  2010   T10 &  T12 Dr. Erline Levine  . MEDIAL PARTIAL KNEE REPLACEMENT Left 2004   Dr. Ronnie Derby  . SPINE SURGERY     vertebroplasty T10, T12  . SPINE SURGERY  11/02/2001    C4-5 & C5-6 Titanium Plate Placed Dr. Carloyn Manner  . THYROIDECTOMY, PARTIAL Left  2007  . TOTAL KNEE ARTHROPLASTY Right 2009   Dr. Alvan Dame     Allergies  Allergen Reactions  . Bactrim     unknown  . Betadine [Povidone Iodine]     unknown  . Metformin And Related     Hurt stomach  . Nutmeg Oil (Myristica Oil)     unknown  . Sulfa Antibiotics     unknown    Allergies as of 12/01/2019      Reactions   Bactrim    unknown   Betadine [povidone Iodine]    unknown   Metformin And Related    Hurt stomach   Nutmeg Oil (myristica Oil)    unknown   Sulfa Antibiotics    unknown      Medication List       Accurate as of December 01, 2019 11:59 PM. If you have any questions, ask your nurse or doctor.        amLODipine 5 MG tablet Commonly known as: NORVASC Take 5 mg by mouth daily.   aspirin 81 MG EC tablet Take 1 tablet (81 mg total) by mouth daily.   betamethasone dipropionate 0.05 % ointment Commonly known as: DIPROLENE Apply topically. Apply BID PRN to active rash on extremities . 1 app immediately after shower. Twice A Day - PRN   furosemide 20 MG tablet Commonly known as: LASIX Take 20 mg by mouth. Once A Day on Mon, Tue, Wed, Thu, Fri   losartan 100 MG tablet Commonly known as: COZAAR Take 100 mg by mouth daily.   metFORMIN 500 MG 24 hr tablet Commonly known as: GLUCOPHAGE-XR Take 250 mg by mouth 2 (two) times daily. Take 1/2 tablet   metoprolol tartrate 25 MG tablet Commonly known as: LOPRESSOR Take 12.5 mg by mouth 2 (two) times daily. Hold Metoprolol for SBP < 110 and DSP <50   mirtazapine 7.5 MG tablet Commonly known as: REMERON Take 7.5 mg by mouth at bedtime.   multivitamins ther. w/minerals Tabs tablet Take 1 tablet by mouth daily. 400 mcg tablet   nitroGLYCERIN 0.4 MG SL tablet Commonly known as: NITROSTAT Place 0.4 mg under the tongue every 5 (five) minutes as needed for chest pain. PLACE 0.4 MG UNDER TONGUE EVERY 5 MINUTES X3 FOR CHEST PAIN, IF NO RELIEF, CALL MD OR SEND TO ED FOR FURTHER EVALUATION.   OPTIVE 0.5-0.9 %  ophthalmic solution Generic drug: carboxymethylcellul-glycerin Place 2 drops into the left eye every 8 (eight) hours as needed for dry eyes.   Voltaren 1 % Gel Generic drug: diclofenac Sodium Apply topically 2 (two) times daily as needed. Apply to both knees       Review of Systems  Constitutional: Negative for fatigue, fever and unexpected weight change.  HENT: Positive for hearing loss. Negative for congestion and voice change.   Eyes: Negative for visual disturbance.  Respiratory: Positive for cough. Negative for shortness of breath and wheezing.        Occasional hacking cough  Cardiovascular: Positive for leg swelling.  Gastrointestinal: Negative for abdominal pain and constipation.  Genitourinary: Negative for difficulty urinating, dysuria and urgency.  Musculoskeletal: Positive for arthralgias, back pain and gait problem.  Skin: Negative for color change.  Neurological: Negative for weakness and light-headedness.       Memory lapses.   Psychiatric/Behavioral: Negative for agitation and sleep disturbance. The patient is not nervous/anxious.     Immunization History  Administered Date(s) Administered  . Influenza Whole 03/30/2012, 04/13/2013, 04/04/2018  . Influenza, High Dose Seasonal PF 04/02/2019  . Influenza-Unspecified 04/17/2014, 03/29/2015  . Moderna SARS-COVID-2 Vaccination 07/02/2019, 07/30/2019  . Pneumococcal Conjugate-13 12/08/2017  . Pneumococcal Polysaccharide-23 06/30/2001  . Td 06/30/2001, 12/08/2017   Pertinent  Health Maintenance Due  Topic Date Due  . FOOT EXAM  12/21/2015  . OPHTHALMOLOGY EXAM  04/02/2016  . INFLUENZA VACCINE  01/29/2020  . HEMOGLOBIN A1C  03/04/2020  . DEXA SCAN  Completed  . PNA vac Low Risk Adult  Completed   Fall Risk  11/24/2017 07/09/2017 11/01/2015 07/26/2015 06/21/2015  Falls in the past year? No No No No No   Functional Status Survey:    Vitals:   12/01/19 1457  BP: 126/66  Pulse: 66  Resp: 18  Temp: 98.4 F (36.9  C)  SpO2: 98%  Weight: 116 lb (52.6 kg)  Height: 5\' 3"  (1.6 m)   Body mass index is 20.55 kg/m. Physical Exam Vitals and nursing note reviewed.  Constitutional:      Appearance: Normal appearance. She is normal weight.  HENT:     Head: Normocephalic and atraumatic.     Mouth/Throat:     Mouth: Mucous membranes are moist.  Eyes:     Extraocular Movements: Extraocular movements intact.     Conjunctiva/sclera: Conjunctivae normal.     Pupils: Pupils are equal, round, and reactive to light.  Cardiovascular:     Rate and Rhythm: Normal rate and regular rhythm.     Heart sounds: No murmur.  Pulmonary:     Breath sounds: No rales.  Abdominal:     General: Bowel sounds are normal.     Palpations: Abdomen is soft.     Tenderness: There is no abdominal tenderness.  Musculoskeletal:     Cervical back: Normal range of motion and neck supple.     Left lower leg: Edema present.     Comments:  trace edema LLE. Kyphoscoliosis.   Skin:    General: Skin is warm and dry.  Neurological:     General: No focal deficit present.     Mental Status: She is alert. Mental status is at baseline.     Gait: Gait abnormal.     Comments: Oriented to person, place.   Psychiatric:        Mood and Affect: Mood normal.        Behavior: Behavior normal.        Thought Content: Thought content normal.        Judgment: Judgment normal.     Labs reviewed: Recent Labs    02/15/19 0000 03/03/19 0000 09/02/19 0000  NA 138 139 138  K 4.7 4.2 4.4  CL 100 105 104  CO2 28 28 28*  BUN 21 18 19   CREATININE 0.9 1.0 0.8  CALCIUM 11.0 11.1 10.8*   Recent Labs    02/15/19 0000 09/02/19 0000  AST 21 20  ALT 17 12  ALKPHOS 46 68  PROT 6.0  --   ALBUMIN 3.9 3.7   Recent Labs    09/02/19 0000  WBC 8.8  NEUTROABS 3,969  HGB 12.0  HCT 36  PLT 262   Lab Results  Component Value Date   TSH 0.45 08/13/2017   Lab Results  Component Value Date   HGBA1C 6.8 09/02/2019   Lab Results  Component  Value Date   CHOL 164 02/11/2019   HDL 53 02/11/2019   LDLCALC 97 02/11/2019   TRIG 59 02/11/2019   CHOLHDL 2.4 06/27/2018    Significant Diagnostic Results in last 30 days:  No results found.  Assessment/Plan Essential hypertension Blood pressure is controlled, continue Amlodipine, Losartan, Metoprolol.   DM type 2 with diabetic peripheral neuropathy (HCC) Stable, Hgb a1c 6.8 09/02/19, continue Metformin.   Depression with anxiety Her mood, weight, sleep are stable, continue Mirtazapine   Memory loss Functioning well in AL, [facility]  Edema Well controlled, echocardiogram EF 50-55% 06/08/19, continue Furosemide 20mg  qd 5 x/wk     Family/ staff Communication: plan of care reviewed with the patient and charge nurse.   Labs/tests ordered:  none  Time spend 40 minutes.

## 2019-12-03 ENCOUNTER — Encounter: Payer: Self-pay | Admitting: Nurse Practitioner

## 2019-12-04 ENCOUNTER — Encounter: Payer: Self-pay | Admitting: Nurse Practitioner

## 2019-12-04 NOTE — Assessment & Plan Note (Signed)
Well controlled, echocardiogram EF 50-55% 06/08/19, continue Furosemide 20mg  qd 5 x/wk

## 2019-12-04 NOTE — Assessment & Plan Note (Signed)
Functioning well in AL, [facility]

## 2019-12-04 NOTE — Assessment & Plan Note (Signed)
Blood pressure is controlled, continue Amlodipine, Losartan, Metoprolol.  

## 2019-12-04 NOTE — Assessment & Plan Note (Signed)
Her mood, weight, sleep are stable, continue Mirtazapine

## 2019-12-04 NOTE — Assessment & Plan Note (Signed)
Stable, Hgb a1c 6.8 09/02/19, continue Metformin.

## 2020-01-03 DIAGNOSIS — I10 Essential (primary) hypertension: Secondary | ICD-10-CM | POA: Diagnosis not present

## 2020-01-03 LAB — BASIC METABOLIC PANEL
BUN: 25 — AB (ref 4–21)
CO2: 27 — AB (ref 13–22)
Chloride: 108 (ref 99–108)
Creatinine: 1 (ref 0.5–1.1)
Glucose: 113
Potassium: 4.5 (ref 3.4–5.3)
Sodium: 140 (ref 137–147)

## 2020-01-03 LAB — COMPREHENSIVE METABOLIC PANEL: Calcium: 10.1 (ref 8.7–10.7)

## 2020-03-09 ENCOUNTER — Encounter: Payer: Self-pay | Admitting: Internal Medicine

## 2020-03-09 ENCOUNTER — Non-Acute Institutional Stay: Payer: Medicare Other | Admitting: Internal Medicine

## 2020-03-09 DIAGNOSIS — R4189 Other symptoms and signs involving cognitive functions and awareness: Secondary | ICD-10-CM | POA: Diagnosis not present

## 2020-03-09 DIAGNOSIS — E1142 Type 2 diabetes mellitus with diabetic polyneuropathy: Secondary | ICD-10-CM | POA: Diagnosis not present

## 2020-03-09 DIAGNOSIS — F418 Other specified anxiety disorders: Secondary | ICD-10-CM

## 2020-03-09 DIAGNOSIS — I10 Essential (primary) hypertension: Secondary | ICD-10-CM | POA: Diagnosis not present

## 2020-03-09 DIAGNOSIS — E213 Hyperparathyroidism, unspecified: Secondary | ICD-10-CM

## 2020-03-09 DIAGNOSIS — R609 Edema, unspecified: Secondary | ICD-10-CM | POA: Diagnosis not present

## 2020-03-09 DIAGNOSIS — E785 Hyperlipidemia, unspecified: Secondary | ICD-10-CM | POA: Diagnosis not present

## 2020-03-09 NOTE — Progress Notes (Signed)
Location:   [place] Room Number: [place]:  ALF [phone]) Provider:  Veleta Miners MD  Mast, Man X, NP  Patient Care Team: Mast, Man X, NP as PCP - General (Internal Medicine) [place] Mast, Man X, NP as Nurse Practitioner (Nurse Practitioner) Jacelyn Pi, MD as Consulting Physician (Endocrinology) Calvert Cantor, MD as Consulting Physician (Ophthalmology) Vickey Huger, MD as Consulting Physician (Orthopedic Surgery) Suella Broad, MD as Consulting Physician (Physical Medicine and Rehabilitation) Erline Levine, MD as Consulting Physician (Neurosurgery) Juanita Craver, MD as Consulting Physician (Gastroenterology) Martinique, Peter M, MD as Consulting Physician (Cardiology) Megan Salon, MD as Consulting Physician (Gynecology)  Extended Emergency Contact Information Primary Emergency Contact: [name], [place] Phone: [phone] Relation: Friend Secondary Emergency Contact: Edmon Crape Mobile Phone: [phone] Relation: Other Preferred language: Cleophus Molt Interpreter needed? No Guardian: Meryl Dare Mobile Phone: [phone] Relation: Legal Guardian Preferred language: English Interpreter needed? No  Code Status:  DNR Goals of care: Advanced Directive information Advanced Directives 03/09/2020  Does Patient Have a Medical Advance Directive? Yes  Type of Advance Directive Out of facility DNR (pink MOST or yellow form);Living will;Healthcare Power of Attorney  Does patient want to make changes to medical advance directive? No - Patient declined  Copy of [place] in Chart? Yes - validated most recent copy scanned in chart (See row information)  Would patient like information on creating a medical advance directive? -  Pre-existing out of facility DNR order (yellow form or pink MOST form) Yellow form placed in chart (order not valid for inpatient use)       Chief Complaint  Patient presents with  . Medical Management of Chronic Issues  . Health Maintenance    Foot and  eye exam, hemoglobin A1C    HPI:  Pt is a 86 y.o. female seen today for medical management of chronic diseases.    Patient is Resident of the ALF here in [facility].  Patient has h/o Hypertension, Diabetes Mellitus Type 2 , and Cognitive impairnement. She also has h/o NSTEMI in 12/19 managed Conservatively with EF of 55% Also has h/o Allergic Dermatitis  Doing well. Walks with her walker. Has gained weight. No recent Falls. No SOB  No Chest Pain. Her only complian was Ears feel full Her POA is her Niece in [place]  Past Medical History:  Diagnosis Date  . Contact dermatitis and other eczema, due to unspecified cause   . Cortical senile cataract   . GERD (gastroesophageal reflux disease) 02/17/2013  . Hyperparathyroidism, unspecified ([place]) 03/26/2010  . Irritable bowel syndrome   . Keratoconjunctivitis sicca, not specified as Sjogren's   . Loss of weight   . Macular degeneration (senile) of retina, unspecified   . Memory loss   . Other and unspecified hyperlipidemia   . Other malaise and fatigue 06/17/2010  . Pain in joint, site unspecified   . Pathologic fracture of vertebrae   . Reflux esophagitis   . Scoliosis (and kyphoscoliosis), idiopathic   . Senile osteoporosis   . Type II or unspecified type diabetes mellitus without mention of complication, uncontrolled   . Unspecified essential hypertension   . Unspecified hereditary and idiopathic peripheral neuropathy   . Unspecified vitamin D deficiency    Past Surgical History:  Procedure Laterality Date  . APPENDECTOMY    . BREAST LUMPECTOMY  02/05/2010   Dr. Christie Beckers  .  CATARACT EXTRACTION W/ INTRAOCULAR LENS  IMPLANT, BILATERAL  04/2013   Dr. Bing Plume  . CHOLECYSTECTOMY  2004   Dr. Zella Richer  . COLONOSCOPY  2001   Dr. Collene Mares  . fibroidectomy  1950  . [place]  . KYPHOPLASTY  2010    T10 & T12 Dr. Erline Levine  . MEDIAL PARTIAL KNEE REPLACEMENT Left 2004   Dr. Ronnie Derby  . SPINE SURGERY     vertebroplasty T10, T12  . SPINE SURGERY  11/02/2001    C4-5 & C5-6 Titanium Plate Placed Dr. Carloyn Manner  . THYROIDECTOMY, PARTIAL Left 2007  . TOTAL KNEE ARTHROPLASTY Right 2009   Dr. Alvan Dame     Allergies  Allergen Reactions  . Bactrim     unknown  . Betadine [Povidone Iodine]     unknown  . Metformin And Related     Hurt stomach  . Nutmeg Oil (Myristica Oil)     unknown  . Sulfa Antibiotics     unknown    Allergies as of 03/09/2020      Reactions   Bactrim    unknown   Betadine [povidone Iodine]    unknown   Metformin And Related    Hurt stomach   Nutmeg Oil (myristica Oil)    unknown   Sulfa Antibiotics    unknown      Medication List       Accurate as of March 09, 2020  3:04 PM. If you have any questions, ask your nurse or doctor.        STOP taking these medications   OPTIVE 0.5-0.9 % ophthalmic solution Generic drug: carboxymethylcellul-glycerin Stopped by: Virgie Dad, MD     TAKE these medications   amLODipine 5 MG tablet Commonly known as: NORVASC Take 5 mg by mouth daily.   aspirin 81 MG EC tablet Take 1 tablet (81 mg total) by mouth daily.   betamethasone dipropionate 0.05 % ointment Commonly known as: DIPROLENE Apply topically. Apply BID PRN to active rash on extremities . 1 app immediately after shower. Twice A Day - PRN   furosemide 20 MG tablet Commonly known as: LASIX Take 20 mg by mouth. Once A Day on Mon, Tue, Wed, Thu, Fri   losartan 100 MG tablet Commonly known as: COZAAR Take 100 mg by mouth daily.   metFORMIN 500 MG 24 hr tablet Commonly known as: GLUCOPHAGE-XR Take 250 mg by mouth 2 (two) times daily. Take 1/2 tablet   metoprolol tartrate 25 MG tablet Commonly known as: LOPRESSOR Take 12.5 mg by mouth 2 (two) times daily. Hold Metoprolol for SBP < 110 and DSP <50   mirtazapine 7.5 MG tablet Commonly known as:  REMERON Take 7.5 mg by mouth at bedtime.   multivitamins ther. w/minerals Tabs tablet Take 1 tablet by mouth daily. 400 mcg tablet   nitroGLYCERIN 0.4 MG SL tablet Commonly known as: NITROSTAT Place 0.4 mg under the tongue every 5 (five) minutes as needed for chest pain. PLACE 0.4 MG UNDER TONGUE EVERY 5 MINUTES X3 FOR CHEST PAIN, IF NO RELIEF, CALL MD OR SEND TO ED FOR FURTHER EVALUATION.   Voltaren 1 % Gel Generic drug: diclofenac Sodium Apply topically 2 (two) times daily as needed. Apply to both knees       Review of Systems  Review of Systems  Constitutional: Negative for activity change, appetite change, chills, diaphoresis, fatigue and fever.  HENT: Negative for mouth sores, postnasal drip, rhinorrhea, sinus pain and sore throat.   Respiratory:  Negative for apnea, cough, chest tightness, shortness of breath and wheezing.   Cardiovascular: Negative for chest pain, palpitations and leg swelling.  Gastrointestinal: Negative for abdominal distention, abdominal pain, constipation, diarrhea, nausea and vomiting.  Genitourinary: Negative for dysuria and frequency.  Musculoskeletal: Negative for arthralgias, joint swelling and myalgias.  Skin: Negative for rash.  Neurological: Negative for dizziness, syncope, weakness, light-headedness and numbness.  Psychiatric/Behavioral: Negative for behavioral problems, confusion and sleep disturbance.     Immunization History  Administered Date(s) Administered  . Influenza Whole 03/30/2012, 04/13/2013, 04/04/2018  . Influenza, High Dose Seasonal PF 04/02/2019  . Influenza-Unspecified 04/17/2014, 03/29/2015  . Moderna SARS-COVID-2 Vaccination 07/02/2019, 07/30/2019  . Pneumococcal Conjugate-13 12/08/2017  . Pneumococcal Polysaccharide-23 06/30/2001  . Td 06/30/2001, 12/08/2017   Pertinent  Health Maintenance Due  Topic Date Due  . FOOT EXAM  12/21/2015  . OPHTHALMOLOGY EXAM  04/02/2016  . INFLUENZA VACCINE  01/29/2020  . HEMOGLOBIN  A1C  03/04/2020  . DEXA SCAN  Completed  . PNA vac Low Risk Adult  Completed   Fall Risk  11/24/2017 07/09/2017 11/01/2015 07/26/2015 06/21/2015  Falls in the past year? No No No No No   Functional Status Survey:    Vitals:   03/09/20 1455  BP: (!) 136/58  Pulse: 73  Resp: (!) 24  Temp: 98.1 F (36.7 C)  SpO2: 99%  Weight: 114 lb 6.4 oz (51.9 kg)  Height: 5\' 3"  (1.6 m)   Body mass index is 20.27 kg/m. Physical Exam  Constitutional: Oriented to person, place, and time. Well-developed and well-nourished.  HENT:  Head: Normocephalic.  Mouth/Throat: Oropharynx is clear and moist.  Eyes: Pupils are equal, round, and reactive to light.  Neck: Neck supple.  Cardiovascular: Normal rate and normal heart sounds.  No murmur heard. Pulmonary/Chest: Effort normal and breath sounds normal. No respiratory distress. No wheezes. She has no rales.  Abdominal: Soft. Bowel sounds are normal. No distension. There is no tenderness. There is no rebound.  Musculoskeletal: No edema.  Lymphadenopathy: none Neurological: Alert and oriented to person, place, and time.  Skin: Skin is warm and dry.  Psychiatric: Normal mood and affect. Behavior is normal. Thought content normal.    Labs reviewed: Recent Labs    09/02/19 0000 01/03/20 0000  NA 138 140  K 4.4 4.5  CL 104 108  CO2 28* 27*  BUN 19 25*  CREATININE 0.8 1.0  CALCIUM 10.8* 10.1   Recent Labs    09/02/19 0000  AST 20  ALT 12  ALKPHOS 68  ALBUMIN 3.7   Recent Labs    09/02/19 0000  WBC 8.8  NEUTROABS 3,969  HGB 12.0  HCT 36  PLT 262   Lab Results  Component Value Date   TSH 0.45 08/13/2017   Lab Results  Component Value Date   HGBA1C 6.8 09/02/2019   Lab Results  Component Value Date   CHOL 164 02/11/2019   HDL 53 02/11/2019   LDLCALC 97 02/11/2019   TRIG 59 02/11/2019   CHOLHDL 2.4 06/27/2018    Significant Diagnostic Results in last 30 days:  No results found.  Assessment/Plan Essential  hypertension Norvasc and Cozaar Dose changed by Cardiology BP controlled CAD On Aspirin and Beta blocker Recent EF showed normal EF  DM type 2 with diabetic peripheral neuropathy (HCC) BS mostly less them 200 Repeat A1C On Metformin Depression with anxiety Doing well on Remeron Hyperparathyroidism (HCC) Calcium stable. Cognitive impairment Supportive care Independent in her ADLS walks with the walker  Edema, unspecified type On Low dose of lasix  ? Wax in Ear Will get ear wash by Dierdre Searles   Family/ staff Communication:   Labs/tests ordered:  CBC,CMP,Lipid Panel,A1C

## 2020-03-13 DIAGNOSIS — I10 Essential (primary) hypertension: Secondary | ICD-10-CM | POA: Diagnosis not present

## 2020-03-13 DIAGNOSIS — E118 Type 2 diabetes mellitus with unspecified complications: Secondary | ICD-10-CM | POA: Diagnosis not present

## 2020-03-13 LAB — BASIC METABOLIC PANEL
BUN: 21 (ref 4–21)
CO2: 26 — AB (ref 13–22)
Chloride: 104 (ref 99–108)
Creatinine: 1 (ref 0.5–1.1)
Glucose: 113
Potassium: 4.2 (ref 3.4–5.3)
Sodium: 139 (ref 137–147)

## 2020-03-13 LAB — COMPREHENSIVE METABOLIC PANEL
Albumin: 4 (ref 3.5–5.0)
Calcium: 10.7 (ref 8.7–10.7)
Globulin: 2.5

## 2020-03-13 LAB — CBC AND DIFFERENTIAL
HCT: 36 (ref 36–46)
Hemoglobin: 12.5 (ref 12.0–16.0)
Neutrophils Absolute: 5216
Platelets: 284 (ref 150–399)
WBC: 10.9

## 2020-03-13 LAB — CBC: RBC: 3.92 (ref 3.87–5.11)

## 2020-03-13 LAB — HEPATIC FUNCTION PANEL
ALT: 15 (ref 7–35)
AST: 20 (ref 13–35)
Alkaline Phosphatase: 79 (ref 25–125)
Bilirubin, Total: 0.5

## 2020-03-13 LAB — LIPID PANEL
Cholesterol: 192 (ref 0–200)
HDL: 66 (ref 35–70)
LDL Cholesterol: 107
Triglycerides: 95 (ref 40–160)

## 2020-03-13 LAB — TSH: TSH: 0.98 (ref 0.41–5.90)

## 2020-03-13 LAB — HEMOGLOBIN A1C: Hemoglobin A1C: 7

## 2020-03-21 DIAGNOSIS — I2510 Atherosclerotic heart disease of native coronary artery without angina pectoris: Secondary | ICD-10-CM | POA: Diagnosis not present

## 2020-03-21 DIAGNOSIS — E119 Type 2 diabetes mellitus without complications: Secondary | ICD-10-CM | POA: Diagnosis not present

## 2020-03-21 DIAGNOSIS — I10 Essential (primary) hypertension: Secondary | ICD-10-CM | POA: Diagnosis not present

## 2020-03-21 DIAGNOSIS — M25561 Pain in right knee: Secondary | ICD-10-CM | POA: Diagnosis not present

## 2020-06-07 ENCOUNTER — Non-Acute Institutional Stay: Payer: Medicare Other | Admitting: Nurse Practitioner

## 2020-06-07 ENCOUNTER — Encounter: Payer: Self-pay | Admitting: Nurse Practitioner

## 2020-06-07 DIAGNOSIS — E1142 Type 2 diabetes mellitus with diabetic polyneuropathy: Secondary | ICD-10-CM | POA: Diagnosis not present

## 2020-06-07 DIAGNOSIS — F418 Other specified anxiety disorders: Secondary | ICD-10-CM | POA: Diagnosis not present

## 2020-06-07 DIAGNOSIS — I10 Essential (primary) hypertension: Secondary | ICD-10-CM | POA: Diagnosis not present

## 2020-06-07 DIAGNOSIS — R609 Edema, unspecified: Secondary | ICD-10-CM

## 2020-06-07 NOTE — Assessment & Plan Note (Signed)
Her mood is stable, on Mirtazapine 7.5mg  qd.

## 2020-06-07 NOTE — Progress Notes (Signed)
Location:    [place] Room Number: 242 Place of Service:  ALF (13) Provider: Marlana Latus NP  Zemirah Krasinski X, NP  Patient Care Team: Mkenzie Dotts X, NP as PCP - General (Internal Medicine) [place] Soni Kegel X, NP as Nurse Practitioner (Nurse Practitioner) Jacelyn Pi, MD as Consulting Physician (Endocrinology) Calvert Cantor, MD as Consulting Physician (Ophthalmology) Vickey Huger, MD as Consulting Physician (Orthopedic Surgery) Suella Broad, MD as Consulting Physician (Physical Medicine and Rehabilitation) Erline Levine, MD as Consulting Physician (Neurosurgery) Juanita Craver, MD as Consulting Physician (Gastroenterology) Martinique, Peter M, MD as Consulting Physician (Cardiology) Megan Salon, MD as Consulting Physician (Gynecology)  Extended Emergency Contact Information Primary Emergency Contact: [name], [place] of [place] Phone: [phone] Relation: Friend Secondary Emergency Contact: Edmon Crape Mobile Phone: [phone] Relation: Other Preferred language: Cleophus Molt Interpreter needed? No Guardian: Meryl Dare Mobile Phone: [phone] Relation: Legal Guardian Preferred language: English Interpreter needed? No  Code Status:  DNR Goals of care: Advanced Directive information Advanced Directives 03/09/2020  Does Patient Have a Medical Advance Directive? Yes  Type of Advance Directive Out of facility DNR (pink MOST or yellow form);Living will;Healthcare Power of Attorney  Does patient want to make changes to medical advance directive? No - Patient declined  Copy of [place] in Chart? Yes - validated most recent copy scanned in chart (See row information)  Would patient like information on creating a medical advance directive? -  Pre-existing out of facility DNR order (yellow form or pink MOST form) Yellow form placed in chart (order not valid for inpatient use)      Chief Complaint  Patient presents with  . Medical Management of Chronic Issues    HPI:  Pt is a 86 y.o. female seen today for medical management of chronic diseases.    HTN, blood pressure is controlled, on Losartan, Metoprolol, Amlodipine. Bun/creat 21/1.0 03/13/20  T2DM, blood sugar is controlled, on Metformin, Hgb a1c 7.0 03/13/20  Her mood is stable, on Mirtazapine 7.5mg  qd.   CHF, compensated on Furosemide 20mg  qd 5x/wk. EF 60-65% 06/28/18   Past Medical History:  Diagnosis Date  . Contact dermatitis and other eczema, due to unspecified cause   . Cortical senile cataract   . GERD (gastroesophageal reflux disease) 02/17/2013  . Hyperparathyroidism, unspecified ([place]) 03/26/2010  . Irritable bowel syndrome   . Keratoconjunctivitis sicca, not specified as Sjogren's   . Loss of weight   . Macular degeneration (senile) of retina, unspecified   . Memory loss   . Other and unspecified hyperlipidemia   . Other malaise and fatigue 06/17/2010  . Pain in joint, site unspecified   . Pathologic fracture of vertebrae   . Reflux esophagitis   . Scoliosis (and kyphoscoliosis), idiopathic   . Senile osteoporosis   . Type II or unspecified type diabetes mellitus without mention of complication, uncontrolled   . Unspecified essential hypertension   . Unspecified hereditary and idiopathic peripheral neuropathy   . Unspecified vitamin D deficiency    Past Surgical History:  Procedure Laterality Date  . APPENDECTOMY    . BREAST LUMPECTOMY  02/05/2010   Dr. Christie Beckers  . CATARACT EXTRACTION W/ INTRAOCULAR LENS  IMPLANT, BILATERAL  04/2013   Dr. Bing Plume  . CHOLECYSTECTOMY  2004   Dr. Zella Richer  . COLONOSCOPY  2001   Dr. Collene Mares  . fibroidectomy  1950  . [place]  .  KYPHOPLASTY  2010   T10 & T12 Dr. Erline Levine  . MEDIAL PARTIAL KNEE REPLACEMENT Left 2004   Dr. Ronnie Derby  . SPINE SURGERY     vertebroplasty T10, T12  . SPINE SURGERY  11/02/2001    C4-5 & C5-6 Titanium  Plate Placed Dr. Carloyn Manner  . THYROIDECTOMY, PARTIAL Left 2007  . TOTAL KNEE ARTHROPLASTY Right 2009   Dr. Alvan Dame     Allergies  Allergen Reactions  . Bactrim     unknown  . Betadine [Povidone Iodine]     unknown  . Metformin And Related     Hurt stomach  . Nutmeg Oil (Myristica Oil)     unknown  . Sulfa Antibiotics     unknown    Allergies as of 06/07/2020      Reactions   Bactrim    unknown   Betadine [povidone Iodine]    unknown   Metformin And Related    Hurt stomach   Nutmeg Oil (myristica Oil)    unknown   Sulfa Antibiotics    unknown      Medication List       Accurate as of June 07, 2020 11:59 PM. If you have any questions, ask your nurse or doctor.        amLODipine 5 MG tablet Commonly known as: NORVASC Take 5 mg by mouth daily.   aspirin 81 MG EC tablet Take 1 tablet (81 mg total) by mouth daily.   betamethasone dipropionate 0.05 % ointment Commonly known as: DIPROLENE Apply topically. Apply BID PRN to active rash on extremities . 1 app immediately after shower. Twice A Day - PRN   diclofenac Sodium 1 % Gel Commonly known as: VOLTAREN Apply topically 2 (two) times daily as needed. Apply to both knees   furosemide 20 MG tablet Commonly known as: LASIX Take 20 mg by mouth. Once A Day on Mon, Tue, Wed, Thu, Fri   losartan 100 MG tablet Commonly known as: COZAAR Take 100 mg by mouth daily.   metFORMIN 500 MG 24 hr tablet Commonly known as: GLUCOPHAGE-XR Take 250 mg by mouth 2 (two) times daily. Take 1/2 tablet   metoprolol tartrate 25 MG tablet Commonly known as: LOPRESSOR Take 12.5 mg by mouth 2 (two) times daily. Hold Metoprolol for SBP < 110 and DSP <50   mirtazapine 7.5 MG tablet Commonly known as: REMERON Take 7.5 mg by mouth at bedtime.   multivitamins ther. w/minerals Tabs tablet Take 1 tablet by mouth daily. 400 mcg tablet   nitroGLYCERIN 0.4 MG SL tablet Commonly known as: NITROSTAT Place 0.4 mg under the tongue every 5  (five) minutes as needed for chest pain. PLACE 0.4 MG UNDER TONGUE EVERY 5 MINUTES X3 FOR CHEST PAIN, IF NO RELIEF, CALL MD OR SEND TO ED FOR FURTHER EVALUATION.       Review of Systems  Constitutional: Negative for fatigue, fever and unexpected weight change.  HENT: Positive for hearing loss. Negative for congestion and voice change.   Eyes: Negative for visual disturbance.  Respiratory: Positive for cough. Negative for shortness of breath and wheezing.        Occasional hacking cough  Cardiovascular: Positive for leg swelling.  Gastrointestinal: Negative for abdominal pain and constipation.  Genitourinary: Negative for difficulty urinating, dysuria and urgency.  Musculoskeletal: Positive for arthralgias, back pain and gait problem.  Skin: Negative for color change.  Neurological: Negative for weakness and light-headedness.       Memory lapses.   Psychiatric/Behavioral:  Negative for agitation and sleep disturbance. The patient is not nervous/anxious.     Immunization History  Administered Date(s) Administered  . Influenza Whole 03/30/2012, 04/13/2013, 04/04/2018  . Influenza, High Dose Seasonal PF 04/02/2019  . Influenza-Unspecified 04/17/2014, 03/29/2015  . Moderna SARS-COVID-2 Vaccination 07/02/2019, 07/30/2019  . Pneumococcal Conjugate-13 12/08/2017  . Pneumococcal Polysaccharide-23 06/30/2001  . Td 06/30/2001, 12/08/2017   Pertinent  Health Maintenance Due  Topic Date Due  . FOOT EXAM  12/21/2015  . OPHTHALMOLOGY EXAM  04/02/2016  . INFLUENZA VACCINE  01/29/2020  . HEMOGLOBIN A1C  09/10/2020  . DEXA SCAN  Completed  . PNA vac Low Risk Adult  Completed   Fall Risk  11/24/2017 07/09/2017 11/01/2015 07/26/2015 06/21/2015  Falls in the past year? No No No No No   Functional Status Survey:    Vitals:   06/07/20 1603  BP: 140/64  Pulse: 62  Resp: 20  Temp: 97.7 F (36.5 C)  SpO2: 99%  Weight: 115 lb 9.6 oz (52.4 kg)  Height: 5\' 3"  (1.6 m)   Body mass index is 20.48  kg/m. Physical Exam Vitals and nursing note reviewed.  Constitutional:      Appearance: Normal appearance. She is normal weight.  HENT:     Head: Normocephalic and atraumatic.     Mouth/Throat:     Mouth: Mucous membranes are moist.  Eyes:     Extraocular Movements: Extraocular movements intact.     Conjunctiva/sclera: Conjunctivae normal.     Pupils: Pupils are equal, round, and reactive to light.  Cardiovascular:     Rate and Rhythm: Normal rate and regular rhythm.     Heart sounds: No murmur heard.   Pulmonary:     Breath sounds: No rales.  Abdominal:     General: Bowel sounds are normal.     Palpations: Abdomen is soft.     Tenderness: There is no abdominal tenderness.  Musculoskeletal:     Cervical back: Normal range of motion and neck supple.     Left lower leg: Edema present.     Comments:  trace edema LLE. Kyphoscoliosis.   Skin:    General: Skin is warm and dry.  Neurological:     General: No focal deficit present.     Mental Status: She is alert. Mental status is at baseline.     Gait: Gait abnormal.     Comments: Oriented to person, place.   Psychiatric:        Mood and Affect: Mood normal.        Behavior: Behavior normal.        Thought Content: Thought content normal.        Judgment: Judgment normal.     Labs reviewed: Recent Labs    09/02/19 0000 01/03/20 0000 03/13/20 0000  NA 138 140 139  K 4.4 4.5 4.2  CL 104 108 104  CO2 28* 27* 26*  BUN 19 25* 21  CREATININE 0.8 1.0 1.0  CALCIUM 10.8* 10.1 10.7   Recent Labs    09/02/19 0000 03/13/20 0000  AST 20 20  ALT 12 15  ALKPHOS 68 79  ALBUMIN 3.7 4.0   Recent Labs    09/02/19 0000 03/13/20 0000  WBC 8.8 10.9  NEUTROABS 3,969 5,216.00  HGB 12.0 12.5  HCT 36 36  PLT 262 284   Lab Results  Component Value Date   TSH 0.98 03/13/2020   Lab Results  Component Value Date   HGBA1C 7.0 03/13/2020   Lab  Results  Component Value Date   CHOL 192 03/13/2020   HDL 66 03/13/2020    LDLCALC 107 03/13/2020   TRIG 95 03/13/2020   CHOLHDL 2.4 06/27/2018    Significant Diagnostic Results in last 30 days:  No results found.  Assessment/Plan  Essential hypertension HTN, blood pressure is controlled, on Losartan, Metoprolol, Amlodipine. Bun/creat 21/1.0 03/13/20  DM type 2 with diabetic peripheral neuropathy (HCC) T2DM, blood sugar is controlled, on Metformin, Hgb a1c 7.0 03/13/20   Depression with anxiety Her mood is stable, on Mirtazapine 7.5mg  qd.   Edema CHF, compensated on Furosemide 20mg  qd 5x/wk. EF 60-65% 06/28/18    Family/ staff Communication: plan of care reviewed with the patient and charge nurse.   Labs/tests ordered:  None  Time spend 40 minutes.

## 2020-06-07 NOTE — Assessment & Plan Note (Addendum)
HTN, blood pressure is controlled, on Losartan, Metoprolol, Amlodipine. Bun/creat 21/1.0 03/13/20

## 2020-06-07 NOTE — Assessment & Plan Note (Addendum)
T2DM, blood sugar is controlled, on Metformin, Hgb a1c 7.0 03/13/20

## 2020-06-07 NOTE — Assessment & Plan Note (Signed)
CHF, compensated on Furosemide 20mg  qd 5x/wk. EF 60-65% 06/28/18

## 2020-06-08 ENCOUNTER — Encounter: Payer: Self-pay | Admitting: Nurse Practitioner

## 2020-10-16 ENCOUNTER — Non-Acute Institutional Stay: Payer: Medicare Other | Admitting: Internal Medicine

## 2020-10-16 ENCOUNTER — Encounter: Payer: Self-pay | Admitting: Internal Medicine

## 2020-10-16 DIAGNOSIS — E213 Hyperparathyroidism, unspecified: Secondary | ICD-10-CM

## 2020-10-16 DIAGNOSIS — E1142 Type 2 diabetes mellitus with diabetic polyneuropathy: Secondary | ICD-10-CM | POA: Diagnosis not present

## 2020-10-16 DIAGNOSIS — R4189 Other symptoms and signs involving cognitive functions and awareness: Secondary | ICD-10-CM

## 2020-10-16 DIAGNOSIS — R609 Edema, unspecified: Secondary | ICD-10-CM | POA: Diagnosis not present

## 2020-10-16 DIAGNOSIS — I10 Essential (primary) hypertension: Secondary | ICD-10-CM

## 2020-10-16 DIAGNOSIS — R635 Abnormal weight gain: Secondary | ICD-10-CM

## 2020-10-16 DIAGNOSIS — F418 Other specified anxiety disorders: Secondary | ICD-10-CM

## 2020-10-16 NOTE — Progress Notes (Signed)
Location:   [facility] Room Number: [place]:  ALF [phone]) Provider:  Veleta Miners MD  Mast, Man X, NP  Patient Care Team: Mast, Man X, NP as PCP - General (Internal Medicine) [place] Mast, Man X, NP as Nurse Practitioner (Nurse Practitioner) Jacelyn Pi, MD as Consulting Physician (Endocrinology) Calvert Cantor, MD as Consulting Physician (Ophthalmology) Vickey Huger, MD as Consulting Physician (Orthopedic Surgery) Suella Broad, MD as Consulting Physician (Physical Medicine and Rehabilitation) Erline Levine, MD as Consulting Physician (Neurosurgery) Juanita Craver, MD as Consulting Physician (Gastroenterology) Martinique, Peter M, MD as Consulting Physician (Cardiology) Megan Salon, MD as Consulting Physician (Gynecology)  Extended Emergency Contact Information Primary Emergency Contact: [name], [place] Phone: [phone] Relation: Friend Secondary Emergency Contact: Edmon Crape Mobile Phone: [phone] Relation: Other Preferred language: Cleophus Molt Interpreter needed? No Guardian: Meryl Dare Mobile Phone: [phone] Relation: Legal Guardian Preferred language: English Interpreter needed? No  Code Status:  DNR Goals of care: Advanced Directive information Advanced Directives 10/16/2020  Does Patient Have a Medical Advance Directive? Yes  Type of Paramedic of [place];Living will;Out of facility DNR (pink MOST or yellow form)  Does patient want to make changes to medical advance directive? No - Patient declined  Copy of [place] in Chart? Yes - validated most recent copy scanned in chart (See row information)  Would patient like information on creating a medical advance directive? -  Pre-existing out of facility DNR order (yellow form or pink MOST form) Yellow form placed in chart (order not valid for inpatient use)      Chief Complaint  Patient presents with  . Medical Management of Chronic Issues  . Health Maintenance    Foot and eye exam, Hemoglobin A1C    HPI:  Pt is a 87 y.o. female seen today for medical management of chronic diseases.    Patient is Resident of the ALF [facility].  Patient has h/o Hypertension, Diabetes Mellitus Type 2 , and Cognitive impairnement. She also has h/o NSTEMI in 12/19 managed Conservatively with EF of 55% Also has h/o Allergic Dermatitis Hyperparathyroidism has had Work up before  Has gained more weight Walks well with her walker Mood stable No New Complains No Nursing issues Her Niece in [place] is her POA   Past Medical History:  Diagnosis Date  . Contact dermatitis and other eczema, due to unspecified cause   . Cortical senile cataract   . GERD (gastroesophageal reflux disease) 02/17/2013  . Hyperparathyroidism, unspecified ([place]) 03/26/2010  . Irritable bowel syndrome   . Keratoconjunctivitis sicca, not specified as Sjogren's   . Loss of weight   . Macular degeneration (senile) of retina, unspecified   . Memory loss   . Other and unspecified hyperlipidemia   . Other malaise and fatigue 06/17/2010  . Pain in joint, site unspecified   . Pathologic fracture of vertebrae   . Reflux esophagitis   . Scoliosis (and kyphoscoliosis), idiopathic   . Senile osteoporosis   . Type II or unspecified type diabetes mellitus without mention of complication, uncontrolled   . Unspecified essential hypertension   . Unspecified hereditary and idiopathic peripheral neuropathy   . Unspecified vitamin D deficiency    Past Surgical History:  Procedure Laterality Date  . APPENDECTOMY    . BREAST LUMPECTOMY  02/05/2010   Dr. Christie Beckers  . CATARACT EXTRACTION W/  INTRAOCULAR LENS  IMPLANT, BILATERAL  04/2013   Dr. Bing Plume  . CHOLECYSTECTOMY  2004   Dr. Zella Richer  . COLONOSCOPY  2001   Dr. Collene Mares  . fibroidectomy  1950  . [place]  .  KYPHOPLASTY  2010   T10 & T12 Dr. Erline Levine  . MEDIAL PARTIAL KNEE REPLACEMENT Left 2004   Dr. Ronnie Derby  . SPINE SURGERY     vertebroplasty T10, T12  . SPINE SURGERY  11/02/2001    C4-5 & C5-6 Titanium Plate Placed Dr. Carloyn Manner  . THYROIDECTOMY, PARTIAL Left 2007  . TOTAL KNEE ARTHROPLASTY Right 2009   Dr. Alvan Dame     Allergies  Allergen Reactions  . Bactrim     unknown  . Betadine [Povidone Iodine]     unknown  . Metformin And Related     Hurt stomach  . Nutmeg Oil (Myristica Oil)     unknown  . Sulfa Antibiotics     unknown    Allergies as of 10/16/2020      Reactions   Bactrim    unknown   Betadine [povidone Iodine]    unknown   Metformin And Related    Hurt stomach   Nutmeg Oil (myristica Oil)    unknown   Sulfa Antibiotics    unknown      Medication List       Accurate as of October 16, 2020 11:13 AM. If you have any questions, ask your nurse or doctor.        amLODipine 5 MG tablet Commonly known as: NORVASC Take 5 mg by mouth daily.   aspirin 81 MG EC tablet Take 1 tablet (81 mg total) by mouth daily.   betamethasone dipropionate 0.05 % ointment Commonly known as: DIPROLENE Apply topically. Apply BID PRN to active rash on extremities . 1 app immediately after shower. Twice A Day - PRN   diclofenac Sodium 1 % Gel Commonly known as: VOLTAREN Apply topically 2 (two) times daily as needed. Apply to both knees   furosemide 20 MG tablet Commonly known as: LASIX Take 20 mg by mouth. Once A Day on Mon, Tue, Wed, Thu, Fri   losartan 100 MG tablet Commonly known as: COZAAR Take 100 mg by mouth daily.   metFORMIN 500 MG 24 hr tablet Commonly known as: GLUCOPHAGE-XR Take 250 mg by mouth 2 (two) times daily. Take 1/2 tablet   metoprolol tartrate 25 MG tablet Commonly known as: LOPRESSOR Take 12.5 mg by mouth 2 (two) times daily. Hold Metoprolol for SBP < 110 and DSP <50   mirtazapine 7.5 MG tablet Commonly known as: REMERON Take 7.5 mg by mouth at  bedtime.   multivitamins ther. w/minerals Tabs tablet Take 1 tablet by mouth daily. 400 mcg tablet   nitroGLYCERIN 0.4 MG SL tablet Commonly known as: NITROSTAT Place 0.4 mg under the tongue every 5 (five) minutes as needed for chest pain. PLACE 0.4 MG UNDER TONGUE EVERY 5 MINUTES X3 FOR CHEST PAIN, IF NO RELIEF, CALL MD OR SEND TO ED FOR FURTHER EVALUATION.       Review of Systems  Review of Systems  Constitutional: Negative for activity change, appetite change, chills, diaphoresis, fatigue and fever.  HENT: Negative for mouth sores, postnasal drip, rhinorrhea, sinus pain and sore throat.   Respiratory: Negative for apnea, cough, chest tightness, shortness of breath and wheezing.   Cardiovascular: Negative for chest pain, palpitations and leg swelling.  Gastrointestinal: Negative for abdominal distention, abdominal pain, constipation, diarrhea,  nausea and vomiting.  Genitourinary: Negative for dysuria and frequency.  Musculoskeletal: Negative for arthralgias, joint swelling and myalgias.  Skin: Negative for rash.  Neurological: Negative for dizziness, syncope, weakness, light-headedness and numbness.  Psychiatric/Behavioral: Negative for behavioral problems, confusion and sleep disturbance.     Immunization History  Administered Date(s) Administered  . Influenza Whole 03/30/2012, 04/13/2013, 04/04/2018  . Influenza, High Dose Seasonal PF 04/02/2019  . Influenza-Unspecified 04/17/2014, 03/29/2015  . Moderna SARS-COV2 Booster Vaccination 05/08/2020  . Moderna Sars-Covid-2 Vaccination 07/02/2019, 07/30/2019  . Pneumococcal Conjugate-13 12/08/2017  . Pneumococcal Polysaccharide-23 06/30/2001  . Td 06/30/2001, 12/08/2017   Pertinent  Health Maintenance Due  Topic Date Due  . FOOT EXAM  12/21/2015  . OPHTHALMOLOGY EXAM  04/02/2016  . HEMOGLOBIN A1C  09/10/2020  . INFLUENZA VACCINE  01/28/2021  . DEXA SCAN  Completed  . PNA vac Low Risk Adult  Completed   Fall Risk   11/24/2017 07/09/2017 11/01/2015 07/26/2015 06/21/2015  Falls in the past year? No No No No No   Functional Status Survey:    Vitals:   10/16/20 1109  BP: 136/78  Pulse: 76  Resp: 19  Temp: 98.3 F (36.8 C)  SpO2: 94%  Weight: 118 lb (53.5 kg)  Height: 5\' 3"  (1.6 m)   Body mass index is 20.9 kg/m. Physical Exam  Constitutional: Oriented to person, place, and time. Well-developed and well-nourished.  HENT:  Head: Normocephalic.  Mouth/Throat: Oropharynx is clear and moist.  Eyes: Pupils are equal, round, and reactive to light.  Neck: Neck supple.  Cardiovascular: Normal rate and normal heart sounds.  No murmur heard. Pulmonary/Chest: Effort normal and breath sounds normal. No respiratory distress. No wheezes. She has no rales.  Abdominal: Soft. Bowel sounds are normal. No distension. There is no tenderness. There is no rebound.  Musculoskeletal: No edema.  Lymphadenopathy: none Neurological: Alert and oriented to person, place, and time.  Skin: Skin is warm and dry.  Psychiatric: Normal mood and affect. Behavior is normal. Thought content normal.    Labs reviewed: Recent Labs    01/03/20 0000 03/13/20 0000  NA 140 139  K 4.5 4.2  CL 108 104  CO2 27* 26*  BUN 25* 21  CREATININE 1.0 1.0  CALCIUM 10.1 10.7   Recent Labs    03/13/20 0000  AST 20  ALT 15  ALKPHOS 79  ALBUMIN 4.0   Recent Labs    03/13/20 0000  WBC 10.9  NEUTROABS 5,216.00  HGB 12.5  HCT 36  PLT 284   Lab Results  Component Value Date   TSH 0.98 03/13/2020   Lab Results  Component Value Date   HGBA1C 7.0 03/13/2020   Lab Results  Component Value Date   CHOL 192 03/13/2020   HDL 66 03/13/2020   LDLCALC 107 03/13/2020   TRIG 95 03/13/2020   CHOLHDL 2.4 06/27/2018    Significant Diagnostic Results in last 30 days:  No results found.  Assessment/Plan  Essential hypertension Stable on Norvasc  DM type 2 with diabetic peripheral neuropathy (HCC) On Low dose of  Metformin  Depression with anxiety Doing well  Edema, unspecified type On Lasix  Hyperparathyroidism ([place]) Repeat Calcium level Has had Work up before with Dr Chalmers Cater  Cognitive impairment Supportive care Walks wih her walker Indepednet in her ADLS Weight gain [place] for now   Family/ staff Communication:   Labs/tests ordered:  CBC,CMP Lipid Panel,A1C,TSH

## 2020-10-26 ENCOUNTER — Encounter: Payer: Self-pay | Admitting: Nurse Practitioner

## 2020-10-26 DIAGNOSIS — J209 Acute bronchitis, unspecified: Secondary | ICD-10-CM | POA: Insufficient documentation

## 2020-10-26 DIAGNOSIS — J440 Chronic obstructive pulmonary disease with acute lower respiratory infection: Secondary | ICD-10-CM | POA: Insufficient documentation

## 2020-11-05 ENCOUNTER — Non-Acute Institutional Stay: Payer: Medicare Other | Admitting: Nurse Practitioner

## 2020-11-05 ENCOUNTER — Encounter: Payer: Self-pay | Admitting: Nurse Practitioner

## 2020-11-05 DIAGNOSIS — E1142 Type 2 diabetes mellitus with diabetic polyneuropathy: Secondary | ICD-10-CM | POA: Diagnosis not present

## 2020-11-05 DIAGNOSIS — F418 Other specified anxiety disorders: Secondary | ICD-10-CM | POA: Diagnosis not present

## 2020-11-05 DIAGNOSIS — R5381 Other malaise: Secondary | ICD-10-CM

## 2020-11-05 DIAGNOSIS — J209 Acute bronchitis, unspecified: Secondary | ICD-10-CM

## 2020-11-05 DIAGNOSIS — I10 Essential (primary) hypertension: Secondary | ICD-10-CM

## 2020-11-05 DIAGNOSIS — J440 Chronic obstructive pulmonary disease with acute lower respiratory infection: Secondary | ICD-10-CM

## 2020-11-05 DIAGNOSIS — R609 Edema, unspecified: Secondary | ICD-10-CM

## 2020-11-05 DIAGNOSIS — R5383 Other fatigue: Secondary | ICD-10-CM

## 2020-11-05 NOTE — Assessment & Plan Note (Addendum)
Elevated Sbp 160s, asymptomatic, on Losartan, Metoprolol, Amlodipine. Continue to observe.

## 2020-11-05 NOTE — Assessment & Plan Note (Signed)
Her mood is stable, off Mirtazapine 7.5mg  qd 10/16/20 due to weigh gain.

## 2020-11-05 NOTE — Progress Notes (Addendum)
Location:   [facility] Room Number: 073 Place of Service:  ALF (13) Provider: Lennie Odor Ernan Runkles NP  Jeanelle Dake X, NP  Patient Care Team: Chayne Baumgart X, NP as PCP - General (Internal Medicine) [place] Shalese Strahan X, NP as Nurse Practitioner (Nurse Practitioner) Jacelyn Pi, MD as Consulting Physician (Endocrinology) Calvert Cantor, MD as Consulting Physician (Ophthalmology) Vickey Huger, MD as Consulting Physician (Orthopedic Surgery) Suella Broad, MD as Consulting Physician (Physical Medicine and Rehabilitation) Erline Levine, MD as Consulting Physician (Neurosurgery) Juanita Craver, MD as Consulting Physician (Gastroenterology) Martinique, Peter M, MD as Consulting Physician (Cardiology) Megan Salon, MD as Consulting Physician (Gynecology)  Extended Emergency Contact Information Primary Emergency Contact: [name], [place] of [place] Phone: [phone] Relation: Friend Secondary Emergency Contact: Edmon Crape Mobile Phone: [phone] Relation: Other Preferred language: Cleophus Molt Interpreter needed? No Guardian: Meryl Dare Mobile Phone: [phone] Relation: Legal Guardian Preferred language: English Interpreter needed? No  Code Status: DNR Goals of care: Advanced Directive information Advanced Directives 10/16/2020  Does Patient Have a Medical Advance Directive? Yes  Type of Paramedic of [place];Living will;Out of facility DNR (pink MOST or yellow form)  Does patient want to make changes to medical advance directive? No - Patient declined  Copy of [place] in Chart? Yes - validated most recent copy scanned in chart (See row information)  Would patient like information on creating a medical advance directive? -  Pre-existing out of facility DNR order (yellow form or pink MOST form) Yellow form placed in chart (order not valid for inpatient use)     Chief  Complaint  Patient presents with  . Acute Visit    HPOA requested a provider visit for s/p antibiotic tx 10 days(last dose 11/04/20) for acute bronchitis.  Reported the patient's cough, weakness, decreased appetite. The patient stated she is fine.     HPI:  Pt is Chief Complaint  Patient presents with  . Acute Visit    HPOA requested a provider visit for s/p antibiotic tx 10 days(last dose 11/04/20) for acute bronchitis.  Reported the patient's cough, weakness, decreased appetite. The patient stated she is fine.       HTN, blood pressure is controlled, on Losartan, Metoprolol, Amlodipine. Bun/creat 24/1.02 10/19/20             T2DM, blood sugar is controlled, on Metformin, Hgb a1c 7.7 10/19/20             Her mood is stable, off Mirtazapine 7.$RemoveBeforeDEI'5mg'tQNJcAukrdFsKrHl$  qd 10/16/20 due to weigh gain.              CHF, compensated, no edema, 11/05/20 off Furosemide $RemoveBefore'20mg'cJXioUwutsFtQ$  qd 5x/wk. EF 60-65% 06/28/18  Hyperparathyroidism, workup with Dr. Chalmers Cater, Ca 10.5 10/19/20  Past Medical History:  Diagnosis Date  . Contact dermatitis and other eczema, due to unspecified cause   . Cortical senile cataract   . GERD (gastroesophageal reflux disease) 02/17/2013  . Hyperparathyroidism, unspecified ([place]) 03/26/2010  . Irritable bowel syndrome   . Keratoconjunctivitis sicca, not specified as Sjogren's   . Loss of weight   . Macular degeneration (senile) of retina, unspecified   . Memory loss   . Other and unspecified hyperlipidemia   . Other malaise and fatigue 06/17/2010  . Pain in joint, site unspecified   . Pathologic fracture of vertebrae   . Reflux esophagitis   . Scoliosis (and kyphoscoliosis), idiopathic   .  Senile osteoporosis   . Type II or unspecified type diabetes mellitus without mention of complication, uncontrolled   . Unspecified essential hypertension   . Unspecified hereditary and idiopathic peripheral neuropathy   . Unspecified vitamin D deficiency    Past Surgical History:  Procedure Laterality Date  .  APPENDECTOMY    . BREAST LUMPECTOMY  02/05/2010   Dr. Christie Beckers  . CATARACT EXTRACTION W/ INTRAOCULAR LENS  IMPLANT, BILATERAL  04/2013   Dr. Bing Plume  . CHOLECYSTECTOMY  2004   Dr. Zella Richer  . COLONOSCOPY  2001   Dr. Collene Mares  . fibroidectomy  1950  . [place]  . KYPHOPLASTY  2010   T10 & T12 Dr. Erline Levine  . MEDIAL PARTIAL KNEE REPLACEMENT Left 2004   Dr. Ronnie Derby  . SPINE SURGERY     vertebroplasty T10, T12  . SPINE SURGERY  11/02/2001    C4-5 & C5-6 Titanium Plate Placed Dr. Carloyn Manner  . THYROIDECTOMY, PARTIAL Left 2007  . TOTAL KNEE ARTHROPLASTY Right 2009   Dr. Alvan Dame     Allergies  Allergen Reactions  . Bactrim     unknown  . Betadine [Povidone Iodine]     unknown  . Metformin And Related     Hurt stomach  . Nutmeg Oil (Myristica Oil)     unknown  . Sulfa Antibiotics     unknown    Allergies as of 11/05/2020      Reactions   Bactrim    unknown   Betadine [povidone Iodine]    unknown   Metformin And Related    Hurt stomach   Nutmeg Oil (myristica Oil)    unknown   Sulfa Antibiotics    unknown      Medication List       Accurate as of Nov 05, 2020 11:59 PM. If you have any questions, ask your nurse or doctor.        amLODipine 5 MG tablet Commonly known as: NORVASC Take 5 mg by mouth daily.   aspirin 81 MG EC tablet Take 1 tablet (81 mg total) by mouth daily.   betamethasone dipropionate 0.05 % ointment Commonly known as: DIPROLENE Apply topically. Apply BID PRN to active rash on extremities . 1 app immediately after shower. Twice A Day - PRN   diclofenac Sodium 1 % Gel Commonly known as: VOLTAREN Apply topically 2 (two) times daily as needed. Apply to both knees   furosemide 20 MG tablet Commonly known as: LASIX Take 20 mg by mouth. Once A Day on Mon, Tue, Wed, Thu, Fri   losartan 100 MG tablet Commonly known as: COZAAR Take 100 mg by mouth daily.   metFORMIN 500 MG 24 hr tablet Commonly known as: GLUCOPHAGE-XR Take 250 mg by  mouth 2 (two) times daily. Take 1/2 tablet   metoprolol tartrate 25 MG tablet Commonly known as: LOPRESSOR Take 12.5 mg by mouth 2 (two) times daily. Hold Metoprolol for SBP < 110 and DSP <50   multivitamins ther. w/minerals Tabs tablet Take 1 tablet by mouth daily. 400 mcg tablet   nitroGLYCERIN 0.4 MG SL tablet Commonly known as: NITROSTAT Place 0.4 mg under the tongue every 5 (five) minutes as needed for chest pain. PLACE 0.4 MG UNDER TONGUE EVERY 5 MINUTES X3 FOR CHEST PAIN, IF NO RELIEF, CALL MD OR SEND TO ED FOR FURTHER EVALUATION.       Review of Systems  Constitutional: Positive for activity change and appetite change. Negative for fever.  HENT: Positive for hearing loss. Negative for congestion and voice change.   Eyes: Negative for visual disturbance.  Respiratory: Positive for cough and wheezing. Negative for shortness of breath.        Wheezing cough  Cardiovascular: Negative for leg swelling.  Gastrointestinal: Negative for abdominal pain and constipation.  Genitourinary: Negative for difficulty urinating, dysuria and urgency.  Musculoskeletal: Positive for arthralgias, back pain and gait problem.  Skin: Negative for color change.  Neurological: Negative for weakness and light-headedness.       Memory lapses.   Psychiatric/Behavioral: Negative for agitation and sleep disturbance. The patient is not nervous/anxious.     Immunization History  Administered Date(s) Administered  . Influenza Whole 03/30/2012, 04/13/2013, 04/04/2018  . Influenza, High Dose Seasonal PF 04/02/2019  . Influenza-Unspecified 04/17/2014, 03/29/2015  . Moderna SARS-COV2 Booster Vaccination 05/08/2020  . Moderna Sars-Covid-2 Vaccination 07/02/2019, 07/30/2019  . Pneumococcal Conjugate-13 12/08/2017  . Pneumococcal Polysaccharide-23 06/30/2001  . Td 06/30/2001, 12/08/2017   Pertinent  Health Maintenance Due  Topic Date Due  . FOOT EXAM  12/21/2015  . OPHTHALMOLOGY EXAM  04/02/2016  .  HEMOGLOBIN A1C  09/10/2020  . INFLUENZA VACCINE  01/28/2021  . DEXA SCAN  Completed  . PNA vac Low Risk Adult  Completed   Fall Risk  11/24/2017 07/09/2017 11/01/2015 07/26/2015 06/21/2015  Falls in the past year? No No No No No   Functional Status Survey:    Vitals:   11/05/20 1003  BP: (!) 166/74  Pulse: 60  Resp: 20  Temp: 97.6 F (36.4 C)  SpO2: 96%   There is no height or weight on file to calculate BMI. Physical Exam Vitals and nursing note reviewed.  Constitutional:      Comments: tired  HENT:     Head: Normocephalic and atraumatic.     Mouth/Throat:     Mouth: Mucous membranes are moist.  Eyes:     Extraocular Movements: Extraocular movements intact.     Conjunctiva/sclera: Conjunctivae normal.     Pupils: Pupils are equal, round, and reactive to light.  Cardiovascular:     Rate and Rhythm: Normal rate and regular rhythm.     Heart sounds: No murmur heard.   Pulmonary:     Breath sounds: Wheezing present. No rales.     Comments: Wheezing cough Abdominal:     General: Bowel sounds are normal.     Palpations: Abdomen is soft.     Tenderness: There is no abdominal tenderness.  Musculoskeletal:     Cervical back: Normal range of motion and neck supple.     Right lower leg: No edema.     Left lower leg: No edema.     Comments: Kyphoscoliosis.   Skin:    General: Skin is warm and dry.  Neurological:     General: No focal deficit present.     Mental Status: She is alert. Mental status is at baseline.     Gait: Gait abnormal.     Comments: Oriented to person, place.   Psychiatric:        Mood and Affect: Mood normal.        Behavior: Behavior normal.        Thought Content: Thought content normal.        Judgment: Judgment normal.     Labs reviewed: Recent Labs    01/03/20 0000 03/13/20 0000  NA 140 139  K 4.5 4.2  CL 108 104  CO2 27* 26*  BUN 25* 21  CREATININE 1.0 1.0  CALCIUM 10.1 10.7   Recent Labs    03/13/20 0000  AST 20  ALT 15   ALKPHOS 79  ALBUMIN 4.0   Recent Labs    03/13/20 0000  WBC 10.9  NEUTROABS 5,216.00  HGB 12.5  HCT 36  PLT 284   Lab Results  Component Value Date   TSH 0.98 03/13/2020   Lab Results  Component Value Date   HGBA1C 7.0 03/13/2020   Lab Results  Component Value Date   CHOL 192 03/13/2020   HDL 66 03/13/2020   LDLCALC 107 03/13/2020   TRIG 95 03/13/2020   CHOLHDL 2.4 06/27/2018    Significant Diagnostic Results in last 30 days:  No results found.  Assessment/Plan: Acute bronchitis with COPD ([place]) 10/25/20 cardiology, Amoxicillin 250mg  tid x 10 days. HPOA requested a provider visit for s/p antibiotic tx 10 days(last dose 11/04/20) for acute bronchitis.  Reported the patient's cough, weakness, decreased appetite. The patient stated she is fine.  11/05/20 obtain CBC/diff, CMP/eGR, CXR ap/lateral. Adding Medrol dose pk for persisted cough with mild wheezing.  11/06/20 CXR no acute cardiopulmonary disease   Essential hypertension Elevated Sbp 160s, asymptomatic, on Losartan, Metoprolol, Amlodipine. Continue to observe.   DM type 2 with diabetic peripheral neuropathy (HCC) blood sugar is controlled, on Metformin, Hgb a1c 7.0 03/13/20   Depression with anxiety Her mood is stable, off Mirtazapine 7.5mg  qd 10/16/20 due to weigh gain.    Edema Compensated, no swelling, dc Furosemide 20mg  qd 5x/wk. EF 60-65% 06/28/18   Hypercalcemia Stable, Ca 10.5 10/19/20  Malaise and fatigue Fatigue, poor appetite, etiology: acute bronchitis, off Mirtazapine about 2 weeks. Dc Furosemide. Obtain CBC/diff, CMP/eGFR, CXR.     Family/ staff Communication: plan of care reviewed with the patient and charge nurse   Labs/tests ordered:  CBC/diff, CMP/eGFR, CXR ap/lateral.   Time spend 40 minutes.

## 2020-11-05 NOTE — Assessment & Plan Note (Addendum)
Compensated, no swelling, dc Furosemide 20mg  qd 5x/wk. EF 60-65% 06/28/18

## 2020-11-05 NOTE — Assessment & Plan Note (Signed)
Stable, Ca 10.5 10/19/20

## 2020-11-05 NOTE — Assessment & Plan Note (Signed)
Fatigue, poor appetite, etiology: acute bronchitis, off Mirtazapine about 2 weeks. Dc Furosemide. Obtain CBC/diff, CMP/eGFR, CXR.

## 2020-11-05 NOTE — Assessment & Plan Note (Addendum)
10/25/20 cardiology, Amoxicillin 250mg  tid x 10 days. HPOA requested a provider visit for s/p antibiotic tx 10 days(last dose 11/04/20) for acute bronchitis.  Reported the patient's cough, weakness, decreased appetite. The patient stated she is fine.  11/05/20 obtain CBC/diff, CMP/eGR, CXR ap/lateral. Adding Medrol dose pk for persisted cough with mild wheezing.  11/06/20 CXR no acute cardiopulmonary disease

## 2020-11-05 NOTE — Assessment & Plan Note (Signed)
blood sugar is controlled, on Metformin, Hgb a1c 7.0 03/13/20

## 2020-11-06 ENCOUNTER — Encounter: Payer: Self-pay | Admitting: Nurse Practitioner

## 2020-11-07 LAB — COMPREHENSIVE METABOLIC PANEL
Albumin: 3.9 (ref 3.5–5.0)
Calcium: 11 — AB (ref 8.7–10.7)
Globulin: 2.2

## 2020-11-07 LAB — BASIC METABOLIC PANEL
BUN: 25 — AB (ref 4–21)
CO2: 27 — AB (ref 13–22)
Chloride: 100 (ref 99–108)
Creatinine: 1.1 (ref 0.5–1.1)
Glucose: 86
Potassium: 4.1 (ref 3.4–5.3)
Sodium: 136 — AB (ref 137–147)

## 2020-11-07 LAB — HEPATIC FUNCTION PANEL
ALT: 13 (ref 7–35)
AST: 23 (ref 13–35)
Alkaline Phosphatase: 67 (ref 25–125)
Bilirubin, Total: 0.4

## 2020-11-07 LAB — CBC AND DIFFERENTIAL
HCT: 38 (ref 36–46)
Hemoglobin: 13.1 (ref 12.0–16.0)
Neutrophils Absolute: 5376
Platelets: 328 (ref 150–399)
WBC: 8.9

## 2020-11-07 LAB — CBC: RBC: 4.16 (ref 3.87–5.11)

## 2020-11-11 ENCOUNTER — Other Ambulatory Visit: Payer: Self-pay

## 2020-11-11 ENCOUNTER — Emergency Department (HOSPITAL_COMMUNITY): Payer: Medicare Other

## 2020-11-11 ENCOUNTER — Inpatient Hospital Stay (HOSPITAL_COMMUNITY)
Admission: EM | Admit: 2020-11-11 | Discharge: 2020-11-13 | DRG: 641 | Disposition: A | Discharge status: Home Health | Payer: Medicare Other | Attending: Student | Admitting: Student

## 2020-11-11 DIAGNOSIS — I252 Old myocardial infarction: Secondary | ICD-10-CM

## 2020-11-11 DIAGNOSIS — M4120 Other idiopathic scoliosis, site unspecified: Secondary | ICD-10-CM | POA: Diagnosis present

## 2020-11-11 DIAGNOSIS — N2581 Secondary hyperparathyroidism of renal origin: Secondary | ICD-10-CM | POA: Diagnosis present

## 2020-11-11 DIAGNOSIS — I5032 Chronic diastolic (congestive) heart failure: Secondary | ICD-10-CM | POA: Diagnosis present

## 2020-11-11 DIAGNOSIS — E860 Dehydration: Secondary | ICD-10-CM | POA: Diagnosis not present

## 2020-11-11 DIAGNOSIS — Z20822 Contact with and (suspected) exposure to covid-19: Secondary | ICD-10-CM | POA: Diagnosis present

## 2020-11-11 DIAGNOSIS — G609 Hereditary and idiopathic neuropathy, unspecified: Secondary | ICD-10-CM | POA: Diagnosis present

## 2020-11-11 DIAGNOSIS — I110 Hypertensive heart disease with heart failure: Secondary | ICD-10-CM | POA: Diagnosis present

## 2020-11-11 DIAGNOSIS — Z8249 Family history of ischemic heart disease and other diseases of the circulatory system: Secondary | ICD-10-CM

## 2020-11-11 DIAGNOSIS — Z7982 Long term (current) use of aspirin: Secondary | ICD-10-CM

## 2020-11-11 DIAGNOSIS — M810 Age-related osteoporosis without current pathological fracture: Secondary | ICD-10-CM | POA: Diagnosis present

## 2020-11-11 DIAGNOSIS — E1142 Type 2 diabetes mellitus with diabetic polyneuropathy: Secondary | ICD-10-CM | POA: Diagnosis present

## 2020-11-11 DIAGNOSIS — R531 Weakness: Secondary | ICD-10-CM

## 2020-11-11 DIAGNOSIS — K219 Gastro-esophageal reflux disease without esophagitis: Secondary | ICD-10-CM | POA: Diagnosis present

## 2020-11-11 DIAGNOSIS — E213 Hyperparathyroidism, unspecified: Secondary | ICD-10-CM | POA: Diagnosis present

## 2020-11-11 DIAGNOSIS — E119 Type 2 diabetes mellitus without complications: Secondary | ICD-10-CM | POA: Diagnosis present

## 2020-11-11 DIAGNOSIS — Z882 Allergy status to sulfonamides status: Secondary | ICD-10-CM

## 2020-11-11 DIAGNOSIS — E785 Hyperlipidemia, unspecified: Secondary | ICD-10-CM | POA: Diagnosis present

## 2020-11-11 DIAGNOSIS — Z833 Family history of diabetes mellitus: Secondary | ICD-10-CM

## 2020-11-11 DIAGNOSIS — Z823 Family history of stroke: Secondary | ICD-10-CM

## 2020-11-11 DIAGNOSIS — Z881 Allergy status to other antibiotic agents status: Secondary | ICD-10-CM

## 2020-11-11 DIAGNOSIS — R413 Other amnesia: Secondary | ICD-10-CM | POA: Diagnosis present

## 2020-11-11 DIAGNOSIS — E1122 Type 2 diabetes mellitus with diabetic chronic kidney disease: Secondary | ICD-10-CM | POA: Diagnosis present

## 2020-11-11 DIAGNOSIS — Z66 Do not resuscitate: Secondary | ICD-10-CM | POA: Diagnosis present

## 2020-11-11 DIAGNOSIS — Z7984 Long term (current) use of oral hypoglycemic drugs: Secondary | ICD-10-CM

## 2020-11-11 DIAGNOSIS — Z79899 Other long term (current) drug therapy: Secondary | ICD-10-CM

## 2020-11-11 DIAGNOSIS — B351 Tinea unguium: Secondary | ICD-10-CM | POA: Diagnosis present

## 2020-11-11 DIAGNOSIS — Z888 Allergy status to other drugs, medicaments and biological substances status: Secondary | ICD-10-CM

## 2020-11-11 DIAGNOSIS — N390 Urinary tract infection, site not specified: Secondary | ICD-10-CM | POA: Diagnosis present

## 2020-11-11 DIAGNOSIS — Z96653 Presence of artificial knee joint, bilateral: Secondary | ICD-10-CM | POA: Diagnosis present

## 2020-11-11 DIAGNOSIS — E872 Acidosis: Secondary | ICD-10-CM | POA: Diagnosis present

## 2020-11-11 DIAGNOSIS — N179 Acute kidney failure, unspecified: Secondary | ICD-10-CM | POA: Diagnosis present

## 2020-11-11 LAB — COMPREHENSIVE METABOLIC PANEL
ALT: 23 U/L (ref 0–44)
AST: 21 U/L (ref 15–41)
Albumin: 4.2 g/dL (ref 3.5–5.0)
Alkaline Phosphatase: 65 U/L (ref 38–126)
Anion gap: 10 (ref 5–15)
BUN: 35 mg/dL — ABNORMAL HIGH (ref 8–23)
CO2: 23 mmol/L (ref 22–32)
Calcium: 11.7 mg/dL — ABNORMAL HIGH (ref 8.9–10.3)
Chloride: 100 mmol/L (ref 98–111)
Creatinine, Ser: 1.3 mg/dL — ABNORMAL HIGH (ref 0.44–1.00)
GFR, Estimated: 38 mL/min — ABNORMAL LOW (ref >60)
Glucose, Bld: 260 mg/dL — ABNORMAL HIGH (ref 70–99)
Potassium: 4.4 mmol/L (ref 3.5–5.1)
Sodium: 133 mmol/L — ABNORMAL LOW (ref 135–145)
Total Bilirubin: 0.8 mg/dL (ref 0.3–1.2)
Total Protein: 6.9 g/dL (ref 6.5–8.1)

## 2020-11-11 LAB — URINALYSIS, MICROSCOPIC (REFLEX): RBC / HPF: NONE SEEN RBC/hpf (ref 0–5)

## 2020-11-11 LAB — RESP PANEL BY RT-PCR (FLU A&B, COVID) ARPGX2
Influenza A by PCR: NEGATIVE
Influenza B by PCR: NEGATIVE
SARS Coronavirus 2 by RT PCR: NEGATIVE

## 2020-11-11 LAB — CBC WITH DIFFERENTIAL/PLATELET
Abs Immature Granulocytes: 0.06 10*3/uL (ref 0.00–0.07)
Basophils Absolute: 0 10*3/uL (ref 0.0–0.1)
Basophils Relative: 0 %
Eosinophils Absolute: 0.1 10*3/uL (ref 0.0–0.5)
Eosinophils Relative: 1 %
HCT: 39.1 % (ref 36.0–46.0)
Hemoglobin: 13.2 g/dL (ref 12.0–15.0)
Immature Granulocytes: 1 %
Lymphocytes Relative: 31 %
Lymphs Abs: 3.7 10*3/uL (ref 0.7–4.0)
MCH: 30.8 pg (ref 26.0–34.0)
MCHC: 33.8 g/dL (ref 30.0–36.0)
MCV: 91.4 fL (ref 80.0–100.0)
Monocytes Absolute: 0.9 10*3/uL (ref 0.1–1.0)
Monocytes Relative: 8 %
Neutro Abs: 7 10*3/uL (ref 1.7–7.7)
Neutrophils Relative %: 59 %
Platelets: 322 10*3/uL (ref 150–400)
RBC: 4.28 MIL/uL (ref 3.87–5.11)
RDW: 13.6 % (ref 11.5–15.5)
WBC: 11.8 10*3/uL — ABNORMAL HIGH (ref 4.0–10.5)
nRBC: 0 % (ref 0.0–0.2)

## 2020-11-11 LAB — URINALYSIS, ROUTINE W REFLEX MICROSCOPIC
Bilirubin Urine: NEGATIVE
Glucose, UA: NEGATIVE mg/dL
Hgb urine dipstick: NEGATIVE
Ketones, ur: NEGATIVE mg/dL
Nitrite: NEGATIVE
Protein, ur: NEGATIVE mg/dL
Specific Gravity, Urine: <1.005 — ABNORMAL LOW (ref 1.005–1.030)
pH: 6 (ref 5.0–8.0)

## 2020-11-11 LAB — LIPASE, BLOOD: Lipase: 53 U/L — ABNORMAL HIGH (ref 11–51)

## 2020-11-11 LAB — PROTIME-INR
INR: 1 (ref 0.8–1.2)
Prothrombin Time: 13.2 seconds (ref 11.4–15.2)

## 2020-11-11 LAB — LACTIC ACID, PLASMA: Lactic Acid, Venous: 2.3 mmol/L (ref 0.5–1.9)

## 2020-11-11 LAB — CBG MONITORING, ED: Glucose-Capillary: 239 mg/dL — ABNORMAL HIGH (ref 70–99)

## 2020-11-11 LAB — TROPONIN I (HIGH SENSITIVITY): Troponin I (High Sensitivity): 5 ng/L (ref <18)

## 2020-11-11 MED ORDER — SODIUM CHLORIDE 0.9 % IV BOLUS
1000.0000 mL | Freq: Once | INTRAVENOUS | Status: AC
  Administered 2020-11-11: 1000 mL via INTRAVENOUS

## 2020-11-11 NOTE — ED Triage Notes (Incomplete)
[facility] c/o generalize weakness for 2 weeks. EMS stated upon arrival pt CBG was 378. Pt denies N/V/D. No Fever reported.

## 2020-11-11 NOTE — ED Provider Notes (Signed)
[place] [facility] DEPT Provider Note   CSN: [id number] Arrival date & time: 11/11/20  2008     History Chief Complaint  Patient presents with  . Weakness    [name] is a 87 y.o. female.  87 year old female with prior medical history as detailed below presents for evaluation.  Patient with DNR/DNI.  Patient reports increased weakness and fatigue.  Patient reports that she has not been eating well for the last 2 or 3 days.  She denies pain.  She denies recent fever.  The history is provided by the patient and medical records.  Weakness Severity:  Moderate Onset quality:  Gradual Duration:  3 days Timing:  Constant Progression:  Worsening Chronicity:  New      Past Medical History:  Diagnosis Date  . Contact dermatitis and other eczema, due to unspecified cause   . Cortical senile cataract   . GERD (gastroesophageal reflux disease) 02/17/2013  . Hyperparathyroidism, unspecified ([place]) 03/26/2010  . Irritable bowel syndrome   . Keratoconjunctivitis sicca, not specified as Sjogren's   . Loss of weight   . Macular degeneration (senile) of retina, unspecified   . Memory loss   . Other and unspecified hyperlipidemia   . Other malaise and fatigue 06/17/2010  . Pain in joint, site unspecified   . Pathologic fracture of vertebrae   . Reflux esophagitis   . Scoliosis (and kyphoscoliosis), idiopathic   . Senile osteoporosis   . Type II or unspecified type diabetes mellitus without mention of complication, uncontrolled   . Unspecified essential hypertension   . Unspecified hereditary and idiopathic peripheral neuropathy   . Unspecified vitamin D deficiency     Patient Active Problem List   Diagnosis Date Noted  . Malaise and fatigue 11/05/2020  . Acute bronchitis with COPD ([place]) 10/26/2020  . Edema 02/17/2019  . Allergic rash  07/30/2018  . NSTEMI (non-ST elevated myocardial infarction) ([place]) 07/20/2018  . Cognitive impairment 07/20/2018  .  Acute coronary syndrome ([place]) 06/27/2018  . Right-sided chest pain 03/31/2018  . Right-sided chest wall pain 03/31/2018  . Chronic upper back pain 02/05/2018  . Hypercalcemia 12/30/2017  . Moderate protein-calorie malnutrition ([place]) 11/05/2017  . Osteoarthritis of multiple joints 11/05/2017  . Unsteady gait 11/05/2017  . Right hip pain 07/24/2016  . Depression with anxiety 02/07/2016  . Pain in left knee 06/12/2014  . Scoliosis 08/04/2013  . GERD (gastroesophageal reflux disease) 02/17/2013  . Irritable bowel syndrome 02/17/2013  . Lumbago 02/17/2013  . Hyperparathyroidism ([place]) 02/17/2013  . Vitamin D deficiency 02/17/2013  . Hyperlipidemia   . Diabetic neuropathy ([place])   . Essential hypertension   . Memory loss   . DM type 2 with diabetic peripheral neuropathy ([place]) 08/27/2012    Past Surgical History:  Procedure Laterality Date  . APPENDECTOMY    . BREAST LUMPECTOMY  02/05/2010   Dr. Christie Beckers  . CATARACT EXTRACTION W/ INTRAOCULAR LENS  IMPLANT, BILATERAL  04/2013   Dr. Bing Plume  . CHOLECYSTECTOMY  2004   Dr. Zella Richer  . COLONOSCOPY  2001   Dr. Collene Mares  . fibroidectomy  1950  . [place]  . KYPHOPLASTY  2010   T10 & T12 Dr. Erline Levine  . MEDIAL PARTIAL KNEE REPLACEMENT Left 2004   Dr. Ronnie Derby  . SPINE SURGERY     vertebroplasty T10, T12  . SPINE SURGERY  11/02/2001    C4-5 & C5-6 Titanium Plate Placed Dr. Carloyn Manner  . THYROIDECTOMY, PARTIAL Left 2007  .  TOTAL KNEE ARTHROPLASTY Right 2009   Dr. Alvan Dame      OB History    Gravida  0   Para  0   Term  0   Preterm  0   AB  0   Living  0     SAB  0   IAB  0   Ectopic  0   Multiple      Live Births              Family History  Problem Relation Age of Onset  . Diabetes Mother   . Heart disease Mother        CHF  . Stroke Father   . Heart disease Sister        MI  . Colon cancer Neg Hx   . Stomach cancer Neg Hx   . Esophageal cancer Neg Hx   . Rectal cancer Neg Hx   . Liver cancer  Neg Hx     Social History   Tobacco Use  . Smoking status: Never Smoker  . Smokeless tobacco: Never Used  Substance Use Topics  . Alcohol use: No  . Drug use: No    Home Medications Prior to Admission medications   Medication Sig Start Date End Date Taking? Authorizing Provider  amLODipine (NORVASC) 5 MG tablet Take 5 mg by mouth daily.     [provider]  aspirin EC 81 MG EC tablet Take 1 tablet (81 mg total) by mouth daily. 06/29/18   Dixie Dials, MD  betamethasone dipropionate (DIPROLENE) 0.05 % ointment Apply topically. Apply BID PRN to active rash on extremities . 1 app immediately after shower. Twice A Day - PRN    [provider]  diclofenac Sodium (VOLTAREN) 1 % GEL Apply topically 2 (two) times daily as needed. Apply to both knees    [provider]  furosemide (LASIX) 20 MG tablet Take 20 mg by mouth. Once A Day on Mon, Tue, Wed, Thu, Fri    [provider]  losartan (COZAAR) 100 MG tablet Take 100 mg by mouth daily.    [provider]  metFORMIN (GLUCOPHAGE-XR) 500 MG 24 hr tablet Take 250 mg by mouth 2 (two) times daily. Take 1/2 tablet    [provider]  metoprolol tartrate (LOPRESSOR) 25 MG tablet Take 12.5 mg by mouth 2 (two) times daily. Hold Metoprolol for SBP < 110 and DSP <50    [provider]  Multiple Vitamins-Minerals (MULTIVITAMINS THER. W/MINERALS) TABS tablet Take 1 tablet by mouth daily. 400 mcg tablet    [provider]  nitroGLYCERIN (NITROSTAT) 0.4 MG SL tablet Place 0.4 mg under the tongue every 5 (five) minutes as needed for chest pain. PLACE 0.4 MG UNDER TONGUE EVERY 5 MINUTES X3 FOR CHEST PAIN, IF NO RELIEF, CALL MD OR SEND TO ED FOR FURTHER EVALUATION.    [provider]    Allergies    Bactrim, Betadine [povidone iodine], Metformin and related, Nutmeg oil (myristica oil), and Sulfa antibiotics  Review of Systems   Review of Systems  Neurological: Positive for  weakness.  All other systems reviewed and are negative.   Physical Exam Updated Vital Signs BP 117/71   Pulse 66   Temp (!) 97.5 F (36.4 C) (Oral)   Resp (!) 27   SpO2 95%   Physical Exam Vitals and nursing note reviewed.  Constitutional:      General: She is not in acute distress.    Appearance:  Normal appearance. She is well-developed.  HENT:     Head: Normocephalic and atraumatic.     Mouth/Throat:     Mouth: Mucous membranes are dry.  Eyes:     Conjunctiva/sclera: Conjunctivae normal.     Pupils: Pupils are equal, round, and reactive to light.  Cardiovascular:     Rate and Rhythm: Normal rate and regular rhythm.     Heart sounds: Normal heart sounds.  Pulmonary:     Effort: Pulmonary effort is normal. No respiratory distress.     Breath sounds: Normal breath sounds.  Abdominal:     General: There is no distension.     Palpations: Abdomen is soft.     Tenderness: There is no abdominal tenderness.  Musculoskeletal:        General: No deformity. Normal range of motion.     Cervical back: Normal range of motion and neck supple.  Skin:    General: Skin is warm and dry.  Neurological:     General: No focal deficit present.     Mental Status: She is alert and oriented to person, place, and time.     ED Results / Procedures / Treatments   Labs (all labs ordered are listed, but only abnormal results are displayed) Labs Reviewed  COMPREHENSIVE METABOLIC PANEL - Abnormal; Notable for the following components:      Result Value   Sodium 133 (*)    Glucose, Bld 260 (*)    BUN 35 (*)    Creatinine, Ser 1.30 (*)    Calcium 11.7 (*)    GFR, Estimated 38 (*)    All other components within normal limits  CBC WITH DIFFERENTIAL/PLATELET - Abnormal; Notable for the following components:   WBC 11.8 (*)    All other components within normal limits  LIPASE, BLOOD - Abnormal; Notable for the following components:   Lipase 53 (*)    All other components within normal limits   LACTIC ACID, PLASMA - Abnormal; Notable for the following components:   Lactic Acid, Venous 2.3 (*)    All other components within normal limits  CBG MONITORING, ED - Abnormal; Notable for the following components:   Glucose-Capillary 239 (*)    All other components within normal limits  RESP PANEL BY RT-PCR (FLU A&B, COVID) ARPGX2  CULTURE, BLOOD (ROUTINE X 2)  CULTURE, BLOOD (ROUTINE X 2)  URINE CULTURE  PROTIME-INR  URINALYSIS, ROUTINE W REFLEX MICROSCOPIC  TROPONIN I (HIGH SENSITIVITY)  TROPONIN I (HIGH SENSITIVITY)    EKG EKG Interpretation  Date/Time:  Sunday Nov 11 2020 20:45:24 EDT Ventricular Rate:  67 PR Interval:  167 QRS Duration: 89 QT Interval:  390 QTC Calculation: 412 R Axis:   -13 Text Interpretation: Sinus rhythm Confirmed by Kristine Royal [phone]) on 11/11/2020 8:50:55 PM   Radiology DG Chest Port 1 View  Result Date: 11/11/2020 CLINICAL DATA:  Shortness of breath with cough EXAM: PORTABLE CHEST 1 VIEW COMPARISON:  June 26, 2018 FINDINGS: Lungs are clear. Heart size and pulmonary vascularity are normal. No adenopathy. Postoperative change in lower cervical spine. Previous kyphoplasty lower thoracic region. Bones osteoporotic. Surgical clips noted in the superior left mediastinal region, stable. IMPRESSION: No edema or airspace opacity.  Heart size normal. Electronically Signed   By: Bretta Bang III M.D.   On: 11/11/2020 22:40    Procedures Procedures   Medications Ordered in ED Medications  sodium chloride 0.9 % bolus 1,000 mL (has no administration in time range)    ED  Course  I have reviewed the triage vital signs and the nursing notes.  Pertinent labs & imaging results that were available during my care of the patient were reviewed by me and considered in my medical decision making (see chart for details).    MDM Rules/Calculators/A&P                          MDM  MSE complete  [name] was evaluated in Emergency  Department on 11/11/2020 for the symptoms described in the history of present illness. She was evaluated in the context of the global COVID-19 pandemic, which necessitated consideration that the patient might be at risk for infection with the SARS-CoV-2 virus that causes COVID-19. Institutional protocols and algorithms that pertain to the evaluation of patients at risk for COVID-19 are in a state of rapid change based on information released by regulatory bodies including the CDC and federal and state organizations. These policies and algorithms were followed during the patient's care in the ED.  Patient presents with complaint of generalized malaise and fatigue.  Patient with significant decreased p.o. intake in the last several days.  Work-up is suggestive of dehydration with elevated creatinine.  BUN is also elevated from baseline.  Lactic acid is elevated 2.3.  Patient would benefit from admission for IV fluids.  UA is pending at time of admission.  Hospitalist service is aware of case and will follow UA.    Final Clinical Impression(s) / ED Diagnoses Final diagnoses:  Dehydration  Weakness    Rx / DC Orders ED Discharge Orders    None       Valarie Merino, MD 11/11/20 2340

## 2020-11-12 ENCOUNTER — Encounter (HOSPITAL_COMMUNITY): Payer: Self-pay | Admitting: Internal Medicine

## 2020-11-12 ENCOUNTER — Observation Stay (HOSPITAL_COMMUNITY): Payer: Medicare Other

## 2020-11-12 DIAGNOSIS — Z823 Family history of stroke: Secondary | ICD-10-CM | POA: Diagnosis not present

## 2020-11-12 DIAGNOSIS — N2581 Secondary hyperparathyroidism of renal origin: Secondary | ICD-10-CM | POA: Diagnosis present

## 2020-11-12 DIAGNOSIS — G609 Hereditary and idiopathic neuropathy, unspecified: Secondary | ICD-10-CM | POA: Diagnosis present

## 2020-11-12 DIAGNOSIS — E785 Hyperlipidemia, unspecified: Secondary | ICD-10-CM | POA: Diagnosis present

## 2020-11-12 DIAGNOSIS — Z7982 Long term (current) use of aspirin: Secondary | ICD-10-CM | POA: Diagnosis not present

## 2020-11-12 DIAGNOSIS — K219 Gastro-esophageal reflux disease without esophagitis: Secondary | ICD-10-CM | POA: Diagnosis present

## 2020-11-12 DIAGNOSIS — Z7984 Long term (current) use of oral hypoglycemic drugs: Secondary | ICD-10-CM | POA: Diagnosis not present

## 2020-11-12 DIAGNOSIS — E872 Acidosis: Secondary | ICD-10-CM | POA: Diagnosis present

## 2020-11-12 DIAGNOSIS — I110 Hypertensive heart disease with heart failure: Secondary | ICD-10-CM | POA: Diagnosis present

## 2020-11-12 DIAGNOSIS — Z20822 Contact with and (suspected) exposure to covid-19: Secondary | ICD-10-CM | POA: Diagnosis present

## 2020-11-12 DIAGNOSIS — I5032 Chronic diastolic (congestive) heart failure: Secondary | ICD-10-CM | POA: Diagnosis present

## 2020-11-12 DIAGNOSIS — N179 Acute kidney failure, unspecified: Secondary | ICD-10-CM

## 2020-11-12 DIAGNOSIS — Z833 Family history of diabetes mellitus: Secondary | ICD-10-CM | POA: Diagnosis not present

## 2020-11-12 DIAGNOSIS — R531 Weakness: Secondary | ICD-10-CM | POA: Diagnosis not present

## 2020-11-12 DIAGNOSIS — E1142 Type 2 diabetes mellitus with diabetic polyneuropathy: Secondary | ICD-10-CM | POA: Diagnosis present

## 2020-11-12 DIAGNOSIS — I252 Old myocardial infarction: Secondary | ICD-10-CM | POA: Diagnosis not present

## 2020-11-12 DIAGNOSIS — E860 Dehydration: Principal | ICD-10-CM

## 2020-11-12 DIAGNOSIS — M810 Age-related osteoporosis without current pathological fracture: Secondary | ICD-10-CM | POA: Diagnosis present

## 2020-11-12 DIAGNOSIS — N390 Urinary tract infection, site not specified: Secondary | ICD-10-CM | POA: Diagnosis present

## 2020-11-12 DIAGNOSIS — Z66 Do not resuscitate: Secondary | ICD-10-CM | POA: Diagnosis present

## 2020-11-12 DIAGNOSIS — E119 Type 2 diabetes mellitus without complications: Secondary | ICD-10-CM | POA: Diagnosis present

## 2020-11-12 DIAGNOSIS — M4120 Other idiopathic scoliosis, site unspecified: Secondary | ICD-10-CM | POA: Diagnosis present

## 2020-11-12 DIAGNOSIS — Z96653 Presence of artificial knee joint, bilateral: Secondary | ICD-10-CM | POA: Diagnosis present

## 2020-11-12 DIAGNOSIS — Z8249 Family history of ischemic heart disease and other diseases of the circulatory system: Secondary | ICD-10-CM | POA: Diagnosis not present

## 2020-11-12 LAB — CBC
HCT: 35.1 % — ABNORMAL LOW (ref 36.0–46.0)
Hemoglobin: 11.8 g/dL — ABNORMAL LOW (ref 12.0–15.0)
MCH: 30.9 pg (ref 26.0–34.0)
MCHC: 33.6 g/dL (ref 30.0–36.0)
MCV: 91.9 fL (ref 80.0–100.0)
Platelets: 271 10*3/uL (ref 150–400)
RBC: 3.82 MIL/uL — ABNORMAL LOW (ref 3.87–5.11)
RDW: 13.7 % (ref 11.5–15.5)
WBC: 12.8 10*3/uL — ABNORMAL HIGH (ref 4.0–10.5)
nRBC: 0 % (ref 0.0–0.2)

## 2020-11-12 LAB — CK: Total CK: 22 U/L — ABNORMAL LOW (ref 38–234)

## 2020-11-12 LAB — COMPREHENSIVE METABOLIC PANEL
ALT: 20 U/L (ref 0–44)
AST: 18 U/L (ref 15–41)
Albumin: 3.3 g/dL — ABNORMAL LOW (ref 3.5–5.0)
Alkaline Phosphatase: 55 U/L (ref 38–126)
Anion gap: 6 (ref 5–15)
BUN: 28 mg/dL — ABNORMAL HIGH (ref 8–23)
CO2: 23 mmol/L (ref 22–32)
Calcium: 10.4 mg/dL — ABNORMAL HIGH (ref 8.9–10.3)
Chloride: 104 mmol/L (ref 98–111)
Creatinine, Ser: 0.89 mg/dL (ref 0.44–1.00)
GFR, Estimated: >60 mL/min (ref >60)
Glucose, Bld: 174 mg/dL — ABNORMAL HIGH (ref 70–99)
Potassium: 4.1 mmol/L (ref 3.5–5.1)
Sodium: 133 mmol/L — ABNORMAL LOW (ref 135–145)
Total Bilirubin: 0.7 mg/dL (ref 0.3–1.2)
Total Protein: 5.5 g/dL — ABNORMAL LOW (ref 6.5–8.1)

## 2020-11-12 LAB — SEDIMENTATION RATE: Sed Rate: 0 mm/hr (ref 0–22)

## 2020-11-12 LAB — C-REACTIVE PROTEIN: CRP: <0.5 mg/dL (ref <1.0)

## 2020-11-12 LAB — GLUCOSE, CAPILLARY
Glucose-Capillary: 161 mg/dL — ABNORMAL HIGH (ref 70–99)
Glucose-Capillary: 153 mg/dL — ABNORMAL HIGH (ref 70–99)
Glucose-Capillary: 154 mg/dL — ABNORMAL HIGH (ref 70–99)
Glucose-Capillary: 87 mg/dL (ref 70–99)
Glucose-Capillary: 115 mg/dL — ABNORMAL HIGH (ref 70–99)

## 2020-11-12 LAB — LACTIC ACID, PLASMA
Lactic Acid, Venous: 0.8 mmol/L (ref 0.5–1.9)
Lactic Acid, Venous: 1.6 mmol/L (ref 0.5–1.9)

## 2020-11-12 LAB — BRAIN NATRIURETIC PEPTIDE: B Natriuretic Peptide: 53.4 pg/mL (ref 0.0–100.0)

## 2020-11-12 LAB — HEMOGLOBIN A1C
Hgb A1c MFr Bld: 7.8 % — ABNORMAL HIGH (ref 4.8–5.6)
Mean Plasma Glucose: 177.16 mg/dL

## 2020-11-12 LAB — TSH: TSH: 0.534 u[IU]/mL (ref 0.350–4.500)

## 2020-11-12 LAB — TROPONIN I (HIGH SENSITIVITY): Troponin I (High Sensitivity): 5 ng/L (ref <18)

## 2020-11-12 LAB — VITAMIN D 25 HYDROXY (VIT D DEFICIENCY, FRACTURES): Vit D, 25-Hydroxy: 38.18 ng/mL (ref 30–100)

## 2020-11-12 LAB — CORTISOL-AM, BLOOD: Cortisol - AM: 8.6 ug/dL (ref 6.7–22.6)

## 2020-11-12 MED ORDER — ASPIRIN EC 81 MG PO TBEC
81.0000 mg | DELAYED_RELEASE_TABLET | Freq: Every day | ORAL | Status: DC
  Administered 2020-11-12 – 2020-11-13 (×2): 81 mg via ORAL
  Filled 2020-11-12 (×2): qty 1

## 2020-11-12 MED ORDER — SODIUM CHLORIDE 0.9 % IV SOLN
1.0000 g | INTRAVENOUS | Status: DC
  Administered 2020-11-12 – 2020-11-13 (×2): 1 g via INTRAVENOUS
  Filled 2020-11-12: qty 10
  Filled 2020-11-12: qty 1

## 2020-11-12 MED ORDER — METOPROLOL TARTRATE 25 MG PO TABS
12.5000 mg | ORAL_TABLET | Freq: Two times a day (BID) | ORAL | Status: DC
  Administered 2020-11-12 – 2020-11-13 (×2): 12.5 mg via ORAL
  Filled 2020-11-12 (×3): qty 1

## 2020-11-12 MED ORDER — INSULIN ASPART 100 UNIT/ML IJ SOLN
0.0000 [IU] | Freq: Three times a day (TID) | INTRAMUSCULAR | Status: DC
  Administered 2020-11-12: 3 [IU] via SUBCUTANEOUS
  Administered 2020-11-12: 2 [IU] via SUBCUTANEOUS

## 2020-11-12 MED ORDER — ACETAMINOPHEN 325 MG PO TABS: 650.0000 mg | ORAL_TABLET | Freq: Four times a day (QID) | ORAL | Status: DC | PRN

## 2020-11-12 MED ORDER — SODIUM CHLORIDE 0.9 % IV SOLN: INTRAVENOUS | Status: AC

## 2020-11-12 MED ORDER — ZOLEDRONIC ACID 4 MG/5ML IV CONC
4.0000 mg | Freq: Once | INTRAVENOUS | Status: AC
  Administered 2020-11-12: 4 mg via INTRAVENOUS
  Filled 2020-11-12: qty 5

## 2020-11-12 MED ORDER — ENOXAPARIN SODIUM 30 MG/0.3ML IJ SOSY
30.0000 mg | PREFILLED_SYRINGE | INTRAMUSCULAR | Status: DC
  Administered 2020-11-12 – 2020-11-13 (×2): 30 mg via SUBCUTANEOUS
  Filled 2020-11-12 (×2): qty 0.3

## 2020-11-12 MED ORDER — ACETAMINOPHEN 650 MG RE SUPP: 650.0000 mg | Freq: Four times a day (QID) | RECTAL | Status: DC | PRN

## 2020-11-12 MED ORDER — HYDRALAZINE HCL 20 MG/ML IJ SOLN: 10.0000 mg | INTRAMUSCULAR | Status: DC | PRN

## 2020-11-12 MED ORDER — AMLODIPINE BESYLATE 5 MG PO TABS
5.0000 mg | ORAL_TABLET | Freq: Every day | ORAL | Status: DC
  Administered 2020-11-12 – 2020-11-13 (×2): 5 mg via ORAL
  Filled 2020-11-12 (×2): qty 1

## 2020-11-12 NOTE — Progress Notes (Signed)
  Echocardiogram 2D Echocardiogram has been performed. Dr. Doylene Canard contacted to read echo at 15:00.   Darlina Sicilian M 11/12/2020, 2:56 PM

## 2020-11-12 NOTE — Evaluation (Signed)
Physical Therapy Evaluation Patient Details Name: [name] MRN: [id number] DOB: Feb 09, 1926 Today's Date: 11/12/2020   History of Present Illness  [name] is a 87 y.o. female was brought to the ER after patient was sleeping increasingly and weak over the last few days.  As per the report patient was recently placed on prednisone and Augmentin for bronchitis.  PMH:  hypertension, diabetes mellitus type 2, diastolic dysfunction per 2D echo done in 2019  Clinical Impression  Pt admitted with above diagnosis. Pt was able to ambulate in hallway with steady gait with RW.  No LOB.  Pt needed a few cues for staying close to RW but pt and caregiver Enid Derry feel that pt is close to baseline.  Pt will have necessary care at A living.  Recommend HHPT to incr pts strength and endurance since pt has been In hospital.  Pt currently with functional limitations due to the deficits listed below (see PT Problem List). Pt will benefit from skilled PT to increase their independence and safety with mobility to allow discharge to the venue listed below.      Follow Up Recommendations Home health PT    Equipment Recommendations  None recommended by PT    Recommendations for Other Services       Precautions / Restrictions Precautions Precautions: Fall Restrictions Weight Bearing Restrictions: No      Mobility  Bed Mobility Overal bed mobility: Needs Assistance Bed Mobility: Supine to Sit     Supine to sit: Min guard     General bed mobility comments: No assist needed just incr time as pt moves slowly.    Transfers Overall transfer level: Needs assistance Equipment used: Rolling walker (2 wheeled) Transfers: Sit to/from Stand Sit to Stand: Supervision         General transfer comment: cues for hand placement only.  Pt pivoted to the 3N1 first and then was able to wipe herself.  Then walked to sink with RW and washed her hands without assist.  Ambulation/Gait Ambulation/Gait  assistance: Min guard;Supervision Gait Distance (Feet): 150 Feet Assistive device: Rolling walker (2 wheeled) Gait Pattern/deviations: Step-through pattern;Decreased stride length     General Gait Details: Pt was able to ambulate into hallway with kyphotic posture most likely close to baseline.  Cues needed to stay close to RW - pt uses rollator at home. Pt was steady with RW and no LOB.  Stairs            Wheelchair Mobility    Modified Rankin (Stroke Patients Only)       Balance Overall balance assessment: Mild deficits observed, not formally tested                                           Pertinent Vitals/Pain Pain Assessment: No/denies pain    Home Living Family/patient expects to be discharged to:: Assisted living Living Arrangements: Alone Available Help at Discharge: Personal care attendant (2 hours M-Sat) Type of Home: Assisted living Home Access: Level entry     Home Layout: One level Home Equipment: Shower seat - built in;Grab bars - tub/shower;Hand held Tourist information centre manager - 4 wheels;Grab bars - toilet Additional Comments: Engineer, agricultural    Prior Function Level of Independence: Needs assistance   Gait / Transfers Assistance Needed: Uses RW or rollator and states she can walk with modif I  ADL's / [employer]  Needed: Pt does own bathing and dressing--has PCA worker there just in case        Hand Dominance   Dominant Hand: Right    Extremity/Trunk Assessment   Upper Extremity Assessment Upper Extremity Assessment: Defer to OT evaluation    Lower Extremity Assessment Lower Extremity Assessment: Generalized weakness    Cervical / Trunk Assessment Cervical / Trunk Assessment: Kyphotic  Communication   Communication: No difficulties  Cognition Arousal/Alertness: Awake/alert Behavior During Therapy: WFL for tasks assessed/performed Overall Cognitive Status: Impaired/Different from baseline Area of Impairment:  Orientation                 Orientation Level: Place             General Comments: Still thinks she is back at ALF at times      General Comments General comments (skin integrity, edema, etc.): VSS    Exercises     Assessment/Plan    PT Assessment Patient needs continued PT services  PT Problem List Decreased balance;Decreased mobility;Decreased activity tolerance;Decreased knowledge of use of DME;Decreased safety awareness;Decreased knowledge of precautions       PT Treatment Interventions DME instruction;Gait training;Functional mobility training;Therapeutic activities;Therapeutic exercise;Balance training;Patient/family education    PT Goals (Current goals can be found in the Care Plan section)  Acute Rehab PT Goals Patient Stated Goal: to go back to ALF PT Goal Formulation: With patient Time For Goal Achievement: 11/26/20 Potential to Achieve Goals: Good    Frequency Min 3X/week   Barriers to discharge        Co-evaluation               AM-PAC PT "6 Clicks" Mobility  Outcome Measure Help needed turning from your back to your side while in a flat bed without using bedrails?: None Help needed moving from lying on your back to sitting on the side of a flat bed without using bedrails?: None Help needed moving to and from a bed to a chair (including a wheelchair)?: None Help needed standing up from a chair using your arms (e.g., wheelchair or bedside chair)?: A Little Help needed to walk in hospital room?: A Little Help needed climbing 3-5 steps with a railing? : A Little 6 Click Score: 21    End of Session Equipment Utilized During Treatment: Gait belt Activity Tolerance: Patient limited by fatigue Patient left: in chair;with call bell/phone within reach;with chair alarm set Nurse Communication: Mobility status PT Visit Diagnosis: Muscle weakness (generalized) (M62.81)    Time: 9604-5409 PT Time Calculation (min) (ACUTE ONLY): 20  min   Charges:   PT Evaluation $PT Eval Moderate Complexity: 1 Mod          Vyom Brass M,PT Acute Rehab Services [phone] (pager)  Alvira Philips 11/12/2020, 11:17 AM

## 2020-11-12 NOTE — Progress Notes (Signed)
PROGRESS NOTE  [name] PFY:[id number] DOB: 01/02/1926   PCP: Mast, Man X, NP  Patient is from: [facility].  Ambulates using walker at baseline.  DOA: 11/11/2020 LOS: 0  Chief complaints: Generalized weakness  Brief Narrative / Interim history: 87 year old F with PMH of DM-2, diastolic CHF, HTN, primary hyperparathyroidism/hypercalcemia, anxiety, depression, cognitive impairment and recent acute bronchitis brought to ED due to progressive generalized weakness, poor appetite and an increased sleepiness.  She was recently treated with Augmentin and prednisone for acute bronchitis.  In ED, slightly hypertensive and tachypneic.  Normal saturation on RA.  Na 133. Cr/BUN 1.3/35 (normal baseline). Ca 11.7.  Lipase 53.  WBC 11.8.  Otherwise, CBC and CMP without significant finding.  Lactic acid 2.3.  Troponin 5x2.  UA with large LE and few bacteria.  COVID-19 and influenza PCR nonreactive.  CXR without acute finding.  Patient received IV fluid.  Cultures obtained.  Started on IV ceftriaxone for presumed UTI, and admitted.  Subjective: Seen and examined earlier this morning.  No major events overnight or this morning.  She states she feels like "run over by a truck".  She reports cough which appears to be weight but not able to cough up phlegm.  She denies chest pain or shortness of breath but feels fatigued and tired.  She denies nausea, vomiting, abdominal pain or dysuria.   Objective: Vitals:   11/12/20 0245 11/12/20 0501 11/12/20 0937 11/12/20 1147  BP: (!) 140/47 (!) 127/56 (!) 141/65 (!) 147/56  Pulse: 75 77 75 66  Resp:  20 (!) 26 20  Temp:  98.2 F (36.8 C) 97.8 F (36.6 C) 97.9 F (36.6 C)  TempSrc:  Oral Oral Oral  SpO2:  98% 100% 100%  Weight:        Intake/Output Summary (Last 24 hours) at 11/12/2020 1316 Last data filed at 11/12/2020 0645 Gross per 24 hour  Intake 1162.03 ml  Output 201 ml  Net 961.03 ml   Filed Weights   11/12/20 0015  Weight: 53.5 kg     Examination:  GENERAL: No apparent distress.  Nontoxic. HEENT: MMM.  Vision and hearing grossly intact.  NECK: Supple.  No apparent JVD.  RESP: On RA.  No IWOB.  Fair aeration bilaterally. CVS:  RRR. Heart sounds normal.  ABD/GI/GU: BS+. Abd soft, NTND.  MSK/EXT:  Moves extremities. No apparent deformity. No edema.  SKIN: no apparent skin lesion or wound NEURO: Awake, alert and oriented fairly.  No apparent focal neuro deficit but generalized weakness. PSYCH: Calm. Normal affect.  Procedures:  None  Microbiology summarized: OTRRN-16 and influenza PCR nonreactive. Blood and urine culture pending.  Assessment & Plan: Generalized weakness/fatigue-could be due to dehydration, hypercalcemia or steroid-induced myopathy.  She was was on prednisone for acute bronchitis recently. I doubt adrenal insufficiency with elevated BP and glucose.  Leukocytosis likely from recent steroid versus infection.  Lactic acidosis likely from dehydration and metformin versus infection.  Hgb, TSH, CK and high-sensitivity troponin within normal.  She has no focal tenderness.   She reports using walker at baseline.  She now feels like "run over by a truck".   -Continue IV fluid hydration for dehydration, AKI and hypercalcemia -Check BNP, TTE given wide pulse pressure -Check a.m. cortisol and work-up for hypercalcemia as below -Check CRP and ESR -Continue IV ceftriaxone pending cultures. -PT/OT  Hypercalcemia/primary hyperparathyroidism?: Ca 11.7>> 10.4 (corrects to 11).  Her PTH was only 48 in 2020. -Continue IV fluid hydration as above -Check vitamin  D levels, PTH and PTHrP -Will discuss IV bisphosphonates with pharmacy -Continue monitoring  AKI/dehydration-AKI improved. -Continue IV fluid for hydration  Lactic acidosis: Likely in the setting of dehydration and possibly from metformin versus infection.  Resolved. -IV fluid hydration as above  Pyuria-patient denies UTI symptoms -I will continue IV  ceftriaxone pending cultures  Uncontrolled NIDDM-2 with neuropathy: A1c 7.8%.  She only takes metformin at home. Recent Labs  Lab 11/11/20 2031 11/12/20 0104 11/12/20 0751 11/12/20 1242  GLUCAP 239* 161* 153* 154*  -Continue SSI-sensitive -Liberate diet due to poor appetite.  Essential hypertension: SBP in 140s with DBP 40s to 60s.  -Continue home amlodipine and metoprolol. -Continue holding home losartan and Lasix  Chronic diastolic CHF: TTE in 6948 with LVEF of 60 to 65%, G1-DD but no other significant finding.  She does not appear fluid overloaded on exam.  Seems dehydrated on arrival.  On IV Lasix at home. -Continue holding Lasix -Check BNP and TTE -Closely monitor fluid and respiratory status while on IV fluid  Recurrent acute bronchitis-recently treated with Augmentin and prednisone.  CXR seems to be hyperinflated although no history of COPD or asthma.  Anxiety and depression: Stable.  Does not seem to be on medication.  Cognitive impairment: Fairly oriented. -Reorientation and delirium precautions  Decreased p.o. intake/poor appetite Body mass index is 20.9 kg/m.  -Consult dietitian  -Liberate diet.     DVT prophylaxis:  enoxaparin (LOVENOX) injection 30 mg Start: 11/12/20 1000  Code Status: DNR Family Communication: Patient and/or RN. Updated Niece, Izora Gala over the phone Level of care: Telemetry Status is: Observation  The patient will require care spanning > 2 midnights and should be moved to inpatient because: Ongoing diagnostic testing needed not appropriate for outpatient work up, IV treatments appropriate due to intensity of illness or inability to take PO and Inpatient level of care appropriate due to severity of illness  Dispo: The patient is from: ILF              Anticipated d/c is to: ILF              Patient currently is not medically stable to d/c.   Difficult to place patient No       Consultants:  None   Sch Meds:  Scheduled Meds: .  amLODipine  5 mg Oral Daily  . aspirin EC  81 mg Oral Daily  . enoxaparin (LOVENOX) injection  30 mg Subcutaneous Q24H  . insulin aspart  0-9 Units Subcutaneous TID WC  . metoprolol tartrate  12.5 mg Oral BID   Continuous Infusions: . sodium chloride 75 mL/hr at 11/12/20 0142  . cefTRIAXone (ROCEPHIN)  IV 1 g (11/12/20 0650)   PRN Meds:.acetaminophen **OR** acetaminophen, hydrALAZINE  Antimicrobials: Anti-infectives (From admission, onward)   Start     Dose/Rate Route Frequency Ordered Stop   11/12/20 0630  cefTRIAXone (ROCEPHIN) 1 g in sodium chloride 0.9 % 100 mL IVPB        1 g 200 mL/hr over 30 Minutes Intravenous Every 24 hours 11/12/20 0543         I have personally reviewed the following labs and images: CBC: Recent Labs  Lab 11/11/20 2052 11/12/20 0547  WBC 11.8* 12.8*  NEUTROABS 7.0  --   HGB 13.2 11.8*  HCT 39.1 35.1*  MCV 91.4 91.9  PLT 322 271   BMP &GFR Recent Labs  Lab 11/11/20 2052 11/12/20 0547  NA 133* 133*  K 4.4 4.1  CL 100 104  CO2 23 23  GLUCOSE 260* 174*  BUN 35* 28*  CREATININE 1.30* 0.89  CALCIUM 11.7* 10.4*   Estimated Creatinine Clearance: 32.7 mL/min (by C-G formula based on SCr of 0.89 mg/dL). Liver & Pancreas: Recent Labs  Lab 11/11/20 2052 11/12/20 0547  AST 21 18  ALT 23 20  ALKPHOS 65 55  BILITOT 0.8 0.7  PROT 6.9 5.5*  ALBUMIN 4.2 3.3*   Recent Labs  Lab 11/11/20 2052  LIPASE 53*   No results for input(s): AMMONIA in the last 168 hours. Diabetic: Recent Labs    11/12/20 0547  HGBA1C 7.8*   Recent Labs  Lab 11/11/20 2031 11/12/20 0104 11/12/20 0751 11/12/20 1242  GLUCAP 239* 161* 153* 154*   Cardiac Enzymes: Recent Labs  Lab 11/12/20 0547  CKTOTAL 22*   No results for input(s): PROBNP in the last 8760 hours. Coagulation Profile: Recent Labs  Lab 11/11/20 2052  INR 1.0   Thyroid Function Tests: Recent Labs    11/12/20 0547  TSH 0.534   Lipid Profile: No results for input(s): CHOL, HDL,  LDLCALC, TRIG, CHOLHDL, LDLDIRECT in the last 72 hours. Anemia Panel: No results for input(s): VITAMINB12, FOLATE, FERRITIN, TIBC, IRON, RETICCTPCT in the last 72 hours. Urine analysis:    Component Value Date/Time   COLORURINE YELLOW 11/11/2020 2052   APPEARANCEUR CLEAR 11/11/2020 2052   LABSPEC <1.005 (L) 11/11/2020 2052   PHURINE 6.0 11/11/2020 2052   GLUCOSEU NEGATIVE 11/11/2020 2052   HGBUR NEGATIVE 11/11/2020 2052   BILIRUBINUR NEGATIVE 11/11/2020 2052   [place] NEGATIVE 11/11/2020 2052   PROTEINUR NEGATIVE 11/11/2020 2052   UROBILINOGEN 0.2 01/19/2008 1015   NITRITE NEGATIVE 11/11/2020 2052   LEUKOCYTESUR LARGE (A) 11/11/2020 2052   Sepsis Labs: Invalid input(s): PROCALCITONIN, [place]  Microbiology: Recent Results (from the past 240 hour(s))  Resp Panel by RT-PCR (Flu A&B, Covid) Nasopharyngeal Swab     Status: None   Collection Time: 11/11/20  8:52 PM   Specimen: Nasopharyngeal Swab; Nasopharyngeal(NP) swabs in vial transport medium  Result Value Ref Range Status   SARS Coronavirus 2 by RT PCR NEGATIVE NEGATIVE Final    Comment: (NOTE) SARS-CoV-2 target nucleic acids are NOT DETECTED.  The SARS-CoV-2 RNA is generally detectable in upper respiratory specimens during the acute phase of infection. The lowest concentration of SARS-CoV-2 viral copies this assay can detect is 138 copies/mL. A negative result does not preclude SARS-Cov-2 infection and should not be used as the sole basis for treatment or other patient management decisions. A negative result may occur with  improper specimen collection/handling, submission of specimen other than nasopharyngeal swab, presence of viral mutation(s) within the areas targeted by this assay, and inadequate number of viral copies(<138 copies/mL). A negative result must be combined with clinical observations, patient history, and epidemiological information. The expected result is Negative.  Fact Sheet for Patients:   EntrepreneurPulse.com.au  Fact Sheet for Healthcare Providers:  IncredibleEmployment.be  This test is no t yet approved or cleared by the [place] FDA and  has been authorized for detection and/or diagnosis of SARS-CoV-2 by FDA under an Emergency Use Authorization (EUA). This EUA will remain  in effect (meaning this test can be used) for the duration of the COVID-19 declaration under Section 564(b)(1) of the Act, 21 U.S.C.section 360bbb-3(b)(1), unless the authorization is terminated  or revoked sooner.       Influenza A by PCR NEGATIVE NEGATIVE Final   Influenza B by PCR NEGATIVE NEGATIVE Final    Comment: (NOTE) The  Xpert Xpress SARS-CoV-2/FLU/RSV plus assay is intended as an aid in the diagnosis of influenza from Nasopharyngeal swab specimens and should not be used as a sole basis for treatment. Nasal washings and aspirates are unacceptable for Xpert Xpress SARS-CoV-2/FLU/RSV testing.  Fact Sheet for Patients: EntrepreneurPulse.com.au  Fact Sheet for Healthcare Providers: IncredibleEmployment.be  This test is not yet approved or cleared by the [place] FDA and has been authorized for detection and/or diagnosis of SARS-CoV-2 by FDA under an Emergency Use Authorization (EUA). This EUA will remain in effect (meaning this test can be used) for the duration of the COVID-19 declaration under Section 564(b)(1) of the Act, 21 U.S.C. section 360bbb-3(b)(1), unless the authorization is terminated or revoked.  Performed at [facility], [place] [address], [place], [place] [address]   Culture, blood (routine x 2)     Status: None (Preliminary result)   Collection Time: 11/11/20  8:53 PM   Specimen: BLOOD  Result Value Ref Range Status   Specimen Description   Final    BLOOD RIGHT ANTECUBITAL Performed at [facility], [place] [address], [place], [place] [address]    Special  Requests   Final    BOTTLES DRAWN AEROBIC AND ANAEROBIC Blood Culture adequate volume Performed at [place] [address], [place], [place] [address]    Culture PENDING  Incomplete   Report Status PENDING  Incomplete  Culture, blood (routine x 2)     Status: None (Preliminary result)   Collection Time: 11/11/20  8:53 PM   Specimen: BLOOD  Result Value Ref Range Status   Specimen Description   Final    BLOOD LEFT ANTECUBITAL Performed at [facility], [place] [address] [place], [place] [address]    Special Requests   Final    BOTTLES DRAWN AEROBIC AND ANAEROBIC Blood Culture adequate volume Performed at [facility] [address], [place], [place] [address]    Culture PENDING  Incomplete   Report Status PENDING  Incomplete    Radiology Studies: DG Chest Port 1 View  Result Date: 11/11/2020 CLINICAL DATA:  Shortness of breath with cough EXAM: PORTABLE CHEST 1 VIEW COMPARISON:  June 26, 2018 FINDINGS: Lungs are clear. Heart size and pulmonary vascularity are normal. No adenopathy. Postoperative change in lower cervical spine. Previous kyphoplasty lower thoracic region. Bones osteoporotic. Surgical clips noted in the superior left mediastinal region, stable. IMPRESSION: No edema or airspace opacity.  Heart size normal. Electronically Signed   By: Lowella Grip III M.D.   On: 11/11/2020 22:40     Aleesha Ringstad T. Foard  If 7PM-7AM, please contact night-coverage [email or web address] 11/12/2020, 1:16 PM

## 2020-11-12 NOTE — ED Notes (Signed)
ED TO INPATIENT HANDOFF REPORT  Name/Age/Gender [name] 87 y.o. female  Code Status Code Status History    Date Active Date Inactive Code Status Order ID Comments User Context   06/27/2018 0244 06/28/2018 2006 Partial Code [id number]  Dixie Dials, MD ED   Advance Care Planning Activity    Questions for Most Recent Historical Code Status (Order [id number])    Question Answer   In the event of cardiac or respiratory ARREST: Initiate Code Blue, Call Rapid Response No   In the event of cardiac or respiratory ARREST: Perform CPR No   In the event of cardiac or respiratory ARREST: Perform Intubation/Mechanical Ventilation No   In the event of cardiac or respiratory ARREST: Use NIPPV/BiPAp only if indicated Yes   In the event of cardiac or respiratory ARREST: Administer ACLS medications if indicated Yes   In the event of cardiac or respiratory ARREST: Perform Defibrillation or Cardioversion if indicated Yes      Home/SNF/Other Skilled nursing facility  Chief Complaint ARF (acute renal failure) ([place]) [N17.9]  Level of Care/Admitting Diagnosis ED Disposition    ED Disposition Condition [place]: [facility] [[id number]]  Level of Care: Telemetry [5]  Admit to tele based on following criteria: Monitor for Ischemic changes  Covid Evaluation: Confirmed COVID Negative  Diagnosis: ARF (acute renal failure) [facility]) [[id number]]  Admitting Physician: [name] [phone]  Attending Physician: [name] [phone]       Medical History Past Medical History:  Diagnosis Date  . Contact dermatitis and other eczema, due to unspecified cause   . Cortical senile cataract   . GERD (gastroesophageal reflux disease) 02/17/2013  . Hyperparathyroidism, unspecified ([place]) 03/26/2010  . Irritable bowel syndrome   . Keratoconjunctivitis sicca, not specified as Sjogren's   . Loss of weight   . Macular degeneration (senile) of retina, unspecified    . Memory loss   . Other and unspecified hyperlipidemia   . Other malaise and fatigue 06/17/2010  . Pain in joint, site unspecified   . Pathologic fracture of vertebrae   . Reflux esophagitis   . Scoliosis (and kyphoscoliosis), idiopathic   . Senile osteoporosis   . Type II or unspecified type diabetes mellitus without mention of complication, uncontrolled   . Unspecified essential hypertension   . Unspecified hereditary and idiopathic peripheral neuropathy   . Unspecified vitamin D deficiency     Allergies Allergies  Allergen Reactions  . Bactrim     unknown  . Betadine [Povidone Iodine]     unknown  . Metformin And Related     Hurt stomach  . Nutmeg Oil (Myristica Oil)     unknown  . Sulfa Antibiotics     unknown    IV Location/Drains/Wounds Patient Lines/Drains/Airways Status    Active Line/Drains/Airways    Name Placement date Placement time Site Days   Peripheral IV 11/11/20 Right Antecubital 11/11/20  2057  Antecubital  1          Labs/Imaging Results for orders placed or performed during the hospital encounter of 11/11/20 (from the past 48 hour(s))  CBG monitoring, ED     Status: Abnormal   Collection Time: 11/11/20  8:31 PM  Result Value Ref Range   Glucose-Capillary 239 (H) 70 - 99 mg/dL    Comment: Glucose reference range applies only to samples taken after fasting for at least 8 hours.  Comprehensive metabolic panel     Status: Abnormal  Collection Time: 11/11/20  8:52 PM  Result Value Ref Range   Sodium 133 (L) 135 - 145 mmol/L   Potassium 4.4 3.5 - 5.1 mmol/L   Chloride 100 98 - 111 mmol/L   CO2 23 22 - 32 mmol/L   Glucose, Bld 260 (H) 70 - 99 mg/dL    Comment: Glucose reference range applies only to samples taken after fasting for at least 8 hours.   BUN 35 (H) 8 - 23 mg/dL   Creatinine, Ser 1.30 (H) 0.44 - 1.00 mg/dL   Calcium 11.7 (H) 8.9 - 10.3 mg/dL   Total Protein 6.9 6.5 - 8.1 g/dL   Albumin 4.2 3.5 - 5.0 g/dL   AST 21 15 - 41 U/L    ALT 23 0 - 44 U/L   Alkaline Phosphatase 65 38 - 126 U/L   Total Bilirubin 0.8 0.3 - 1.2 mg/dL   GFR, Estimated 38 (L) >60 mL/min    Comment: (NOTE) Calculated using the CKD-EPI Creatinine Equation (2021)    Anion gap 10 5 - 15    Comment: Performed at [facility], [place] [address], [place], [place] [address]  CBC with Differential     Status: Abnormal   Collection Time: 11/11/20  8:52 PM  Result Value Ref Range   WBC 11.8 (H) 4.0 - 10.5 K/uL   RBC 4.28 3.87 - 5.11 MIL/uL   Hemoglobin 13.2 12.0 - 15.0 g/dL   HCT 39.1 36.0 - 46.0 %   MCV 91.4 80.0 - 100.0 fL   MCH 30.8 26.0 - 34.0 pg   MCHC 33.8 30.0 - 36.0 g/dL   RDW 13.6 11.5 - 15.5 %   Platelets 322 150 - 400 K/uL   nRBC 0.0 0.0 - 0.2 %   Neutrophils Relative % 59 %   Neutro Abs 7.0 1.7 - 7.7 K/uL   Lymphocytes Relative 31 %   Lymphs Abs 3.7 0.7 - 4.0 K/uL   Monocytes Relative 8 %   Monocytes Absolute 0.9 0.1 - 1.0 K/uL   Eosinophils Relative 1 %   Eosinophils Absolute 0.1 0.0 - 0.5 K/uL   Basophils Relative 0 %   Basophils Absolute 0.0 0.0 - 0.1 K/uL   Immature Granulocytes 1 %   Abs Immature Granulocytes 0.06 0.00 - 0.07 K/uL    Comment: Performed at [facility], [place] [address] [place], [place] [address]  Lipase, blood     Status: Abnormal   Collection Time: 11/11/20  8:52 PM  Result Value Ref Range   Lipase 53 (H) 11 - 51 U/L    Comment: Performed at [facility], [place] [address], [place], [place] [address]  Lactic acid, plasma     Status: Abnormal   Collection Time: 11/11/20  8:52 PM  Result Value Ref Range   Lactic Acid, Venous 2.3 (HH) 0.5 - 1.9 mmol/L    Comment: CRITICAL RESULT CALLED TO, READ BACK BY AND VERIFIED WITH: TALKINGTON,J 11/11/20 @2210  BY SEEL,M Performed at [facility], [place] [address], [place], [place] [address]   Urinalysis, Routine w reflex microscopic     Status: Abnormal   Collection Time: 11/11/20  8:52 PM  Result  Value Ref Range   Color, Urine YELLOW YELLOW   APPearance CLEAR CLEAR   Specific Gravity, Urine <1.005 (L) 1.005 - 1.030   pH 6.0 5.0 - 8.0   Glucose, UA NEGATIVE NEGATIVE mg/dL   Hgb urine dipstick NEGATIVE NEGATIVE   Bilirubin Urine NEGATIVE  NEGATIVE   Ketones, ur NEGATIVE NEGATIVE mg/dL   Protein, ur NEGATIVE NEGATIVE mg/dL   Nitrite NEGATIVE NEGATIVE   Leukocytes,Ua LARGE (A) NEGATIVE    Comment: Performed at [facility], [place] [address] [place], [place] [address]  Troponin I (High Sensitivity)     Status: None   Collection Time: 11/11/20  8:52 PM  Result Value Ref Range   Troponin I (High Sensitivity) 5 <18 ng/L    Comment: (NOTE) Elevated high sensitivity troponin I (hsTnI) values and significant  changes across serial measurements may suggest ACS but many other  chronic and acute conditions are known to elevate hsTnI results.  Refer to the "Links" section for chest pain algorithms and additional  guidance. Performed at [facility], [place] [address] [place], [place] [address]   Protime-INR     Status: None   Collection Time: 11/11/20  8:52 PM  Result Value Ref Range   Prothrombin Time 13.2 11.4 - 15.2 seconds   INR 1.0 0.8 - 1.2    Comment: (NOTE) INR goal varies based on device and disease states. Performed at [facility], [place] [address], [place], [place] [address]   Resp Panel by RT-PCR (Flu A&B, Covid) Nasopharyngeal Swab     Status: None   Collection Time: 11/11/20  8:52 PM   Specimen: Nasopharyngeal Swab; Nasopharyngeal(NP) swabs in vial transport medium  Result Value Ref Range   SARS Coronavirus 2 by RT PCR NEGATIVE NEGATIVE    Comment: (NOTE) SARS-CoV-2 target nucleic acids are NOT DETECTED.  The SARS-CoV-2 RNA is generally detectable in upper respiratory specimens during the acute phase of infection. The lowest concentration of SARS-CoV-2 viral copies this assay can detect is 138 copies/mL. A  negative result does not preclude SARS-Cov-2 infection and should not be used as the sole basis for treatment or other patient management decisions. A negative result may occur with  improper specimen collection/handling, submission of specimen other than nasopharyngeal swab, presence of viral mutation(s) within the areas targeted by this assay, and inadequate number of viral copies(<138 copies/mL). A negative result must be combined with clinical observations, patient history, and epidemiological information. The expected result is Negative.  Fact Sheet for Patients:  EntrepreneurPulse.com.au  Fact Sheet for Healthcare Providers:  IncredibleEmployment.be  This test is no t yet approved or cleared by the [place] FDA and  has been authorized for detection and/or diagnosis of SARS-CoV-2 by FDA under an Emergency Use Authorization (EUA). This EUA will remain  in effect (meaning this test can be used) for the duration of the COVID-19 declaration under Section 564(b)(1) of the Act, 21 U.S.C.section 360bbb-3(b)(1), unless the authorization is terminated  or revoked sooner.       Influenza A by PCR NEGATIVE NEGATIVE   Influenza B by PCR NEGATIVE NEGATIVE    Comment: (NOTE) The Xpert Xpress SARS-CoV-2/FLU/RSV plus assay is intended as an aid in the diagnosis of influenza from Nasopharyngeal swab specimens and should not be used as a sole basis for treatment. Nasal washings and aspirates are unacceptable for Xpert Xpress SARS-CoV-2/FLU/RSV testing.  Fact Sheet for Patients: EntrepreneurPulse.com.au  Fact Sheet for Healthcare Providers: IncredibleEmployment.be  This test is not yet approved or cleared by the [place] FDA and has been authorized for detection and/or diagnosis of SARS-CoV-2 by FDA under an Emergency Use Authorization (EUA). This EUA will remain in effect (meaning this test can be used)  for the duration of the COVID-19 declaration under Section 564(b)(1) of  the Act, 21 U.S.C. section 360bbb-3(b)(1), unless the authorization is terminated or revoked.  Performed at [facility], [place] [address], [place], [place] [address]   Urinalysis, Microscopic (reflex)     Status: Abnormal   Collection Time: 11/11/20  8:52 PM  Result Value Ref Range   RBC / HPF NONE SEEN 0 - 5 RBC/hpf   WBC, UA 6-10 0 - 5 WBC/hpf   Bacteria, UA FEW (A) NONE SEEN   Squamous Epithelial / LPF 0-5 0 - 5    Comment: Performed at [facility], [place] [address], [place], [place] [address]   DG Chest Port 1 View  Result Date: 11/11/2020 CLINICAL DATA:  Shortness of breath with cough EXAM: PORTABLE CHEST 1 VIEW COMPARISON:  June 26, 2018 FINDINGS: Lungs are clear. Heart size and pulmonary vascularity are normal. No adenopathy. Postoperative change in lower cervical spine. Previous kyphoplasty lower thoracic region. Bones osteoporotic. Surgical clips noted in the superior left mediastinal region, stable. IMPRESSION: No edema or airspace opacity.  Heart size normal. Electronically Signed   By: Lowella Grip III M.D.   On: 11/11/2020 22:40    Pending Labs Unresulted Labs (From admission, onward)          Start     Ordered   11/11/20 2030  Culture, blood (routine x 2)  BLOOD CULTURE X 2,   STAT      11/11/20 2029   11/11/20 2030  Urine culture  ONCE - STAT,   STAT        11/11/20 2029          Vitals/Pain Today's Vitals   11/11/20 2300 11/11/20 2315 11/11/20 2330 11/12/20 0000  BP: (!) 147/58 117/81 (!) 164/59 (!) 143/59  Pulse: 71 67 69 71  Resp: (!) 27 (!) 21 16 17   Temp:      TempSrc:      SpO2: 99% 98% 99% 98%  PainSc:        Isolation Precautions No active isolations  Medications Medications  sodium chloride 0.9 % bolus 1,000 mL (0 mLs Intravenous Stopped 11/12/20 0002)    Mobility walks with person assist

## 2020-11-12 NOTE — H&P (Signed)
History and Physical    [name] DOB: 08/23/1925 DOA: 11/11/2020  PCP: Mast, Man X, NP  Patient coming from: [facility].  Chief Complaint: Weakness.  History obtained from patient and patient's caregiver.  HPI: [name] is a 87 y.o. female with hypertension, diabetes mellitus type 2, diastolic dysfunction per 2D echo done in 2019 was brought to the ER after patient was sleeping increasingly weak over the last few days.  As per the report patient was recently placed on prednisone and Augmentin for bronchitis.  Last 1 week patient has been feeling weak and poor appetite but no nausea vomiting or diarrhea or any chest pain.  ED Course: In the ER patient appears globally weak with no focal deficit.  EKG was sinus rhythm.  Troponins were negative.  COVID test was negative.  Labs show increased creatinine from baseline of 1 it is around 1.3 with patient having chronic hypercalcemia secondary hyperparathyroidism.  Lactic acid mildly elevated 2.3.  Patient was started on fluids with normal saline bolus given by the ER physician and gently hydrating admitted for dehydration.  Review of Systems: As per HPI, rest all negative.   Past Medical History:  Diagnosis Date  . Contact dermatitis and other eczema, due to unspecified cause   . Cortical senile cataract   . GERD (gastroesophageal reflux disease) 02/17/2013  . Hyperparathyroidism, unspecified ([place]) 03/26/2010  . Irritable bowel syndrome   . Keratoconjunctivitis sicca, not specified as Sjogren's   . Loss of weight   . Macular degeneration (senile) of retina, unspecified   . Memory loss   . Other and unspecified hyperlipidemia   . Other malaise and fatigue 06/17/2010  . Pain in joint, site unspecified   . Pathologic fracture of vertebrae   . Reflux esophagitis   . Scoliosis (and kyphoscoliosis), idiopathic   . Senile osteoporosis   . Type II or unspecified type diabetes mellitus without mention of complication,  uncontrolled   . Unspecified essential hypertension   . Unspecified hereditary and idiopathic peripheral neuropathy   . Unspecified vitamin D deficiency     Past Surgical History:  Procedure Laterality Date  . APPENDECTOMY    . BREAST LUMPECTOMY  02/05/2010   Dr. Christie Beckers  . CATARACT EXTRACTION W/ INTRAOCULAR LENS  IMPLANT, BILATERAL  04/2013   Dr. Bing Plume  . CHOLECYSTECTOMY  2004   Dr. Zella Richer  . COLONOSCOPY  2001   Dr. Collene Mares  . fibroidectomy  1950  . [place]  . KYPHOPLASTY  2010   T10 & T12 Dr. Erline Levine  . MEDIAL PARTIAL KNEE REPLACEMENT Left 2004   Dr. Ronnie Derby  . SPINE SURGERY     vertebroplasty T10, T12  . SPINE SURGERY  11/02/2001    C4-5 & C5-6 Titanium Plate Placed Dr. Carloyn Manner  . THYROIDECTOMY, PARTIAL Left 2007  . TOTAL KNEE ARTHROPLASTY Right 2009   Dr. Alvan Dame      reports that she has never smoked. She has never used smokeless tobacco. She reports that she does not drink alcohol and does not use drugs.  Allergies  Allergen Reactions  . Bactrim     unknown  . Betadine [Povidone Iodine]     unknown  . Metformin And Related     Hurt stomach  . Nutmeg Oil (Myristica Oil)     unknown  . Sulfa Antibiotics     unknown    Family History  Problem Relation Age of Onset  . Diabetes Mother   .  Heart disease Mother        CHF  . Stroke Father   . Heart disease Sister        MI  . Colon cancer Neg Hx   . Stomach cancer Neg Hx   . Esophageal cancer Neg Hx   . Rectal cancer Neg Hx   . Liver cancer Neg Hx     Prior to Admission medications   Medication Sig Start Date End Date Taking? Authorizing Provider  amLODipine (NORVASC) 5 MG tablet Take 5 mg by mouth daily.     [provider]  aspirin EC 81 MG EC tablet Take 1 tablet (81 mg total) by mouth daily. 06/29/18   Dixie Dials, MD  betamethasone dipropionate (DIPROLENE) 0.05 % ointment Apply topically. Apply BID PRN to active rash on extremities . 1 app immediately after shower. Twice  A Day - PRN    [provider]  diclofenac Sodium (VOLTAREN) 1 % GEL Apply topically 2 (two) times daily as needed. Apply to both knees    [provider]  furosemide (LASIX) 20 MG tablet Take 20 mg by mouth. Once A Day on Mon, Tue, Wed, Thu, Fri    [provider]  losartan (COZAAR) 100 MG tablet Take 100 mg by mouth daily.    [provider]  metFORMIN (GLUCOPHAGE-XR) 500 MG 24 hr tablet Take 250 mg by mouth 2 (two) times daily. Take 1/2 tablet    [provider]  metoprolol tartrate (LOPRESSOR) 25 MG tablet Take 12.5 mg by mouth 2 (two) times daily. Hold Metoprolol for SBP < 110 and DSP <50    [provider]  Multiple Vitamins-Minerals (MULTIVITAMINS THER. W/MINERALS) TABS tablet Take 1 tablet by mouth daily. 400 mcg tablet    [provider]  nitroGLYCERIN (NITROSTAT) 0.4 MG SL tablet Place 0.4 mg under the tongue every 5 (five) minutes as needed for chest pain. PLACE 0.4 MG UNDER TONGUE EVERY 5 MINUTES X3 FOR CHEST PAIN, IF NO RELIEF, CALL MD OR SEND TO ED FOR FURTHER EVALUATION.    [provider]    Physical Exam: Constitutional: Moderately built and nourished. Vitals:   11/11/20 2315 11/11/20 2330 11/12/20 0000 11/12/20 0015  BP: 117/81 (!) 164/59 (!) 143/59 (!) 147/50  Pulse: 67 69 71 66  Resp: (!) 21 16 17 20   Temp:    97.8 F (36.6 C)  TempSrc:    Oral  SpO2: 98% 99% 98% 98%   Eyes: Anicteric no pallor. ENMT: No discharge from the ears eyes nose and mouth. Neck: No mass felt.  No neck rigidity. Respiratory: No rhonchi or crepitations. Cardiovascular: S1-S2 heard. Abdomen: Soft nontender bowel sounds present. Musculoskeletal: No edema. Skin: No rash. Neurologic: Alert awake oriented to time place and person.  Moves all extremities. Psychiatric: Appears normal.  Normal affect.   Labs on Admission: I have personally reviewed following labs and imaging studies  CBC: Recent Labs  Lab 11/11/20 2052   WBC 11.8*  NEUTROABS 7.0  HGB 13.2  HCT 39.1  MCV 91.4  PLT 267   Basic Metabolic Panel: Recent Labs  Lab 11/11/20 2052  NA 133*  K 4.4  CL 100  CO2 23  GLUCOSE 260*  BUN 35*  CREATININE 1.30*  CALCIUM 11.7*   GFR: CrCl cannot be calculated (Unknown ideal weight.). Liver Function Tests: Recent Labs  Lab 11/11/20 2052  AST 21  ALT 23  ALKPHOS 65  BILITOT 0.8  PROT 6.9  ALBUMIN 4.2  Recent Labs  Lab 11/11/20 2052  LIPASE 53*   No results for input(s): AMMONIA in the last 168 hours. Coagulation Profile: Recent Labs  Lab 11/11/20 2052  INR 1.0   Cardiac Enzymes: No results for input(s): CKTOTAL, CKMB, CKMBINDEX, TROPONINI in the last 168 hours. BNP (last 3 results) No results for input(s): PROBNP in the last 8760 hours. HbA1C: No results for input(s): HGBA1C in the last 72 hours. CBG: Recent Labs  Lab 11/11/20 2031  GLUCAP 239*   Lipid Profile: No results for input(s): CHOL, HDL, LDLCALC, TRIG, CHOLHDL, LDLDIRECT in the last 72 hours. Thyroid Function Tests: No results for input(s): TSH, T4TOTAL, FREET4, T3FREE, THYROIDAB in the last 72 hours. Anemia Panel: No results for input(s): VITAMINB12, FOLATE, FERRITIN, TIBC, IRON, RETICCTPCT in the last 72 hours. Urine analysis:    Component Value Date/Time   COLORURINE YELLOW 11/11/2020 2052   APPEARANCEUR CLEAR 11/11/2020 2052   LABSPEC <1.005 (L) 11/11/2020 2052   PHURINE 6.0 11/11/2020 2052   GLUCOSEU NEGATIVE 11/11/2020 2052   HGBUR NEGATIVE 11/11/2020 2052   BILIRUBINUR NEGATIVE 11/11/2020 2052   KETONESUR NEGATIVE 11/11/2020 2052   PROTEINUR NEGATIVE 11/11/2020 2052   UROBILINOGEN 0.2 01/19/2008 1015   NITRITE NEGATIVE 11/11/2020 2052   LEUKOCYTESUR LARGE (A) 11/11/2020 2052   Sepsis Labs: @LABRCNTIP (procalcitonin:4,lacticidven:4) ) Recent Results (from the past 240 hour(s))  Resp Panel by RT-PCR (Flu A&B, Covid) Nasopharyngeal Swab     Status: None   Collection Time: 11/11/20  8:52 PM    Specimen: Nasopharyngeal Swab; Nasopharyngeal(NP) swabs in vial transport medium  Result Value Ref Range Status   SARS Coronavirus 2 by RT PCR NEGATIVE NEGATIVE Final    Comment: (NOTE) SARS-CoV-2 target nucleic acids are NOT DETECTED.  The SARS-CoV-2 RNA is generally detectable in upper respiratory specimens during the acute phase of infection. The lowest concentration of SARS-CoV-2 viral copies this assay can detect is 138 copies/mL. A negative result does not preclude SARS-Cov-2 infection and should not be used as the sole basis for treatment or other patient management decisions. A negative result may occur with  improper specimen collection/handling, submission of specimen other than nasopharyngeal swab, presence of viral mutation(s) within the areas targeted by this assay, and inadequate number of viral copies(<138 copies/mL). A negative result must be combined with clinical observations, patient history, and epidemiological information. The expected result is Negative.  Fact Sheet for Patients:  EntrepreneurPulse.com.au  Fact Sheet for Healthcare Providers:  IncredibleEmployment.be  This test is no t yet approved or cleared by the [place] FDA and  has been authorized for detection and/or diagnosis of SARS-CoV-2 by FDA under an Emergency Use Authorization (EUA). This EUA will remain  in effect (meaning this test can be used) for the duration of the COVID-19 declaration under Section 564(b)(1) of the Act, 21 U.S.C.section 360bbb-3(b)(1), unless the authorization is terminated  or revoked sooner.       Influenza A by PCR NEGATIVE NEGATIVE Final   Influenza B by PCR NEGATIVE NEGATIVE Final    Comment: (NOTE) The Xpert Xpress SARS-CoV-2/FLU/RSV plus assay is intended as an aid in the diagnosis of influenza from Nasopharyngeal swab specimens and should not be used as a sole basis for treatment. Nasal washings and aspirates are  unacceptable for Xpert Xpress SARS-CoV-2/FLU/RSV testing.  Fact Sheet for Patients: EntrepreneurPulse.com.au  Fact Sheet for Healthcare Providers: IncredibleEmployment.be  This test is not yet approved or cleared by the [place] FDA and has been authorized for detection and/or diagnosis of SARS-CoV-2  by FDA under an Emergency Use Authorization (EUA). This EUA will remain in effect (meaning this test can be used) for the duration of the COVID-19 declaration under Section 564(b)(1) of the Act, 21 U.S.C. section 360bbb-3(b)(1), unless the authorization is terminated or revoked.  Performed at [facility], [place] [address] [place], [place] [address]   Culture, blood (routine x 2)     Status: None (Preliminary result)   Collection Time: 11/11/20  8:53 PM   Specimen: BLOOD  Result Value Ref Range Status   Specimen Description   Final    BLOOD RIGHT ANTECUBITAL Performed at [facility], [place] [address] [place], [place] [address]    Special Requests   Final    BOTTLES DRAWN AEROBIC AND ANAEROBIC Blood Culture adequate volume Performed at [facility] [address] [place], [place] [address]    Culture PENDING  Incomplete   Report Status PENDING  Incomplete  Culture, blood (routine x 2)     Status: None (Preliminary result)   Collection Time: 11/11/20  8:53 PM   Specimen: BLOOD  Result Value Ref Range Status   Specimen Description   Final    BLOOD LEFT ANTECUBITAL Performed at [facility], [place] [address], [place], [place] [address]    Special Requests   Final    BOTTLES DRAWN AEROBIC AND ANAEROBIC Blood Culture adequate volume Performed at [facility] [address] [place], [place] [address]    Culture PENDING  Incomplete   Report Status PENDING  Incomplete     Radiological Exams on Admission: DG Chest Port 1 View  Result Date: 11/11/2020 CLINICAL DATA:   Shortness of breath with cough EXAM: PORTABLE CHEST 1 VIEW COMPARISON:  June 26, 2018 FINDINGS: Lungs are clear. Heart size and pulmonary vascularity are normal. No adenopathy. Postoperative change in lower cervical spine. Previous kyphoplasty lower thoracic region. Bones osteoporotic. Surgical clips noted in the superior left mediastinal region, stable. IMPRESSION: No edema or airspace opacity.  Heart size normal. Electronically Signed   By: Lowella Grip III M.D.   On: 11/11/2020 22:40    EKG: Independently reviewed.  Normal sinus rhythm.  Assessment/Plan Principal Problem:   ARF (acute renal failure) (HCC) Active Problems:   DM type 2 with diabetic peripheral neuropathy (HCC)   Hyperlipidemia   Memory loss   Hyperparathyroidism ([place])   Hypercalcemia   Dehydration    1. Dehydration/acute renal failure -patient clinically appears dehydrated.  We will gently hydrate follow metabolic panel.  Patient's Lasix was recently discontinued by patient's primary care physician. 2. Possible UTI and will keep patient on ceftriaxone follow urine cultures. 3. Diabetes mellitus type 2 we will keep patient on sliding scale coverage.  Blood sugar remains slightly high likely from recent use of prednisone.  Check hemoglobin A1c. 4. Hypertension on amlodipine and metoprolol. 5. History of diastolic dysfunction per 2D echo done in 2019 presently receiving fluids.  Patient used to be on Lasix which was recently discontinued by patient's primary care physician. 6. Recently treated for acute bronchitis with prednisone and Augmentin. 7. Chronic hypercalcemia likely from hyperparathyroidism.   DVT prophylaxis: Lovenox. Code Status: DNR. Family Communication: Will need to discuss with family.  Caregiver at bedside. Disposition Plan: Back to independent living facility. Consults called: Physical therapy. Admission status: Observation.   [name] MD [facility] Pager [phone].  If 7PM-7AM, please contact night-coverage [email or web address] Password [facility]  11/12/2020, 12:46 AM

## 2020-11-12 NOTE — Plan of Care (Signed)
Care plan initiated. Kjones, RN 

## 2020-11-12 NOTE — Evaluation (Signed)
Occupational Therapy Evaluation and Discharge Patient Details Name: [name] MRN: [id number] DOB: [birth year] Today's Date: 11/12/2020    History of Present Illness [name] is a 87 y.o. female with hypertension, diabetes mellitus type 2, diastolic dysfunction per 2D echo done in 2019 was brought to the ER after patient was sleeping increasingly weak over the last few days.  As per the report patient was recently placed on prednisone and Augmentin for bronchitis   Clinical Impression   This 87 yo female admitted with above presents to acute OT with PLOF of being able to do her own basic ADLs (showering,sponge bath, dressing, toileting), ambulating with rollator and taking her meals in dining area. Currently she is at an intermittent S level and will have this from PCA and can call for A prn as well. No further OT needs, OT will sign off.    Follow Up Recommendations  No OT follow up;Supervision - Intermittent    Equipment Recommendations  None recommended by OT       Precautions / Restrictions Precautions Precautions: Fall Restrictions Weight Bearing Restrictions: No      Mobility Bed Mobility               General bed mobility comments: Up with PT on arrival    Transfers Overall transfer level: Needs assistance Equipment used: Rolling walker (2 wheeled) Transfers: Sit to/from Stand Sit to Stand: Supervision              Balance Overall balance assessment: Mild deficits observed, not formally tested                                         ADL either performed or assessed with clinical judgement   ADL Overall ADL's : Needs assistance/impaired Eating/Feeding: Independent   Grooming: Supervision/safety;Standing   Upper Body Bathing: Supervision/ safety;Sitting   Lower Body Bathing: Supervison/ safety;Sit to/from stand   Upper Body Dressing : Supervision/safety;Sitting   Lower Body Dressing: Supervision/safety;Sit to/from  stand   Toilet Transfer: Supervision/safety;Stand-pivot;BSC   Toileting- Water quality scientist and Hygiene: Supervision/safety;Sit to/from stand               Vision Patient Visual Report: No change from baseline              Pertinent Vitals/Pain Pain Assessment: No/denies pain     Hand Dominance Right   Extremity/Trunk Assessment Upper Extremity Assessment Upper Extremity Assessment: Overall WFL for tasks assessed           Communication Communication Communication: No difficulties   Cognition Arousal/Alertness: Awake/alert Behavior During Therapy: WFL for tasks assessed/performed Overall Cognitive Status: Impaired/Different from baseline Area of Impairment: Orientation                 Orientation Level: Place             General Comments: Still thinks she is back at ALF at times              Home Living Family/patient expects to be discharged to:: Assisted living   Available Help at Discharge: Personal care attendant (2 hours M-Sat) Type of Home: Assisted living Home Access: Level entry     Home Layout: One level     Bathroom Shower/Tub: Occupational psychologist: Handicapped height     Home Equipment: Shower seat - built in;Grab bars - tub/shower;Hand held shower  head;Walker - 4 wheels;Grab bars - toilet   Additional Comments: Engineer, agricultural      Prior Functioning/Environment Level of Independence: Needs assistance  Gait / Transfers Assistance Needed: Uses RW or rollator and states she can walk with modif I ADL's / Homemaking Assistance Needed: Pt does own bathing and dressing--has PCA worker there just in case            OT Problem List: Decreased cognition         OT Goals(Current goals can be found in the care plan section) Acute Rehab OT Goals Patient Stated Goal: to go back to ALF  OT Frequency:                AM-PAC OT "6 Clicks" Daily Activity     Outcome Measure Help from another person  eating meals?: None Help from another person taking care of personal grooming?: A Little Help from another person toileting, which includes using toliet, bedpan, or urinal?: A Little Help from another person bathing (including washing, rinsing, drying)?: A Little Help from another person to put on and taking off regular upper body clothing?: A Little Help from another person to put on and taking off regular lower body clothing?: A Little 6 Click Score: 19   End of Session Equipment Utilized During Treatment: Gait belt;Rolling walker  Activity Tolerance: Patient tolerated treatment well Patient left: in chair;with call bell/phone within reach;with chair alarm set  OT Visit Diagnosis: Unsteadiness on feet (R26.81);Other symptoms and signs involving cognitive function                Time: 2751-7001 OT Time Calculation (min): 11 min Charges:  OT General Charges $OT Visit: 1 Visit OT Evaluation $OT Eval Low Complexity: 1 Low  [place], OTR/L Acute [employer] Pager [phone] Office [phone]     Almon Register 11/12/2020, 10:18 AM

## 2020-11-12 NOTE — Progress Notes (Signed)
RN admit note: pt received from ED via stretcher, Shiley (personal HHA and companion) at the bedside. Pt. From [place], per [place] pt was treated for bronchitis and respiratory symptoms was getting better, but patient had increased weakness and loss of appetite.S/W pt's niece Izora Gala over the phone who resides in [place].  Pt oriented to unit, and room, see flowsheet for physical assessment, pt. Delayed to respond but alert and oriented, placed on Tele per MD order, NSR 70s on monitor. Placed on falls precaution, yellow bracelet applied, bed alarm initiated, educated patient on reason for falls, pt verbalized understanding, pt DNR, pink DNR bracelet applied. Denied any pain, reports feeling tired. IVF initiated per MD order. No distress noted, will continue to monitor pt closely. Neomia Dear RN

## 2020-11-12 NOTE — Progress Notes (Signed)
   11/12/20 0937  Assess: MEWS Score  Temp 97.8 F (36.6 C)  BP (!) 141/65  Pulse Rate 75  Resp (!) 26  Level of Consciousness Alert  SpO2 100 %  O2 Device Room Air  Assess: MEWS Score  MEWS Temp 0  MEWS Systolic 0  MEWS Pulse 0  MEWS RR 2  MEWS LOC 0  MEWS Score 2  MEWS Score Color Yellow  Assess: if the MEWS score is Yellow or Red  Were vital signs taken at a resting state? Yes  Focused Assessment No change from prior assessment  Does the patient meet 2 or more of the SIRS criteria? No  MEWS guidelines implemented *See Row Information* Yes  Treat  Pain Scale 0-10  Pain Score 0  Take Vital Signs  Increase Vital Sign Frequency  Yellow: Q 2hr X 2 then Q 4hr X 2, if remains yellow, continue Q 4hrs  Escalate  MEWS: Escalate Yellow: discuss with charge nurse/RN and consider discussing with provider and RRT (respirations are a little higher)  Notify: Charge Nurse/RN  Name of Charge Nurse/RN Notified Kim  Date Charge Nurse/RN Notified 11/12/20  Time Charge Nurse/RN Notified 0942  Assess: SIRS CRITERIA  SIRS Temperature  0  SIRS Pulse 0  SIRS Respirations  1  SIRS WBC 0  SIRS Score Sum  1

## 2020-11-13 DIAGNOSIS — R531 Weakness: Secondary | ICD-10-CM

## 2020-11-13 DIAGNOSIS — I5032 Chronic diastolic (congestive) heart failure: Secondary | ICD-10-CM

## 2020-11-13 DIAGNOSIS — R638 Other symptoms and signs concerning food and fluid intake: Secondary | ICD-10-CM

## 2020-11-13 DIAGNOSIS — E785 Hyperlipidemia, unspecified: Secondary | ICD-10-CM

## 2020-11-13 DIAGNOSIS — E1142 Type 2 diabetes mellitus with diabetic polyneuropathy: Secondary | ICD-10-CM

## 2020-11-13 LAB — RENAL FUNCTION PANEL
Albumin: 3 g/dL — ABNORMAL LOW (ref 3.5–5.0)
Anion gap: 6 (ref 5–15)
BUN: 17 mg/dL (ref 8–23)
CO2: 24 mmol/L (ref 22–32)
Calcium: 9.9 mg/dL (ref 8.9–10.3)
Chloride: 106 mmol/L (ref 98–111)
Creatinine, Ser: 0.78 mg/dL (ref 0.44–1.00)
GFR, Estimated: >60 mL/min (ref >60)
Glucose, Bld: 116 mg/dL — ABNORMAL HIGH (ref 70–99)
Phosphorus: 3 mg/dL (ref 2.5–4.6)
Potassium: 4 mmol/L (ref 3.5–5.1)
Sodium: 136 mmol/L (ref 135–145)

## 2020-11-13 LAB — PTH, INTACT AND CALCIUM
Calcium, Total (PTH): 10.6 mg/dL — ABNORMAL HIGH (ref 8.7–10.3)
PTH: 35 pg/mL (ref 15–65)

## 2020-11-13 LAB — ECHOCARDIOGRAM COMPLETE
Area-P 1/2: 4.6 cm2
Calc EF: 70.4 %
S' Lateral: 2.25 cm
Single Plane A2C EF: 69.5 %
Single Plane A4C EF: 72 %
Weight: 1888 oz

## 2020-11-13 LAB — CBC
HCT: 34.2 % — ABNORMAL LOW (ref 36.0–46.0)
Hemoglobin: 11.3 g/dL — ABNORMAL LOW (ref 12.0–15.0)
MCH: 31 pg (ref 26.0–34.0)
MCHC: 33 g/dL (ref 30.0–36.0)
MCV: 94 fL (ref 80.0–100.0)
Platelets: 242 10*3/uL (ref 150–400)
RBC: 3.64 MIL/uL — ABNORMAL LOW (ref 3.87–5.11)
RDW: 13.9 % (ref 11.5–15.5)
WBC: 8.7 10*3/uL (ref 4.0–10.5)
nRBC: 0 % (ref 0.0–0.2)

## 2020-11-13 LAB — IRON AND TIBC
Iron: 70 ug/dL (ref 28–170)
Saturation Ratios: 23 % (ref 10.4–31.8)
TIBC: 307 ug/dL (ref 250–450)
UIBC: 237 ug/dL

## 2020-11-13 LAB — URINE CULTURE

## 2020-11-13 LAB — RETICULOCYTES
Immature Retic Fract: 3.7 % (ref 2.3–15.9)
RBC.: 3.63 MIL/uL — ABNORMAL LOW (ref 3.87–5.11)
Retic Count, Absolute: 35.6 10*3/uL (ref 19.0–186.0)
Retic Ct Pct: 1 % (ref 0.4–3.1)

## 2020-11-13 LAB — GLUCOSE, CAPILLARY
Glucose-Capillary: 112 mg/dL — ABNORMAL HIGH (ref 70–99)
Glucose-Capillary: 120 mg/dL — ABNORMAL HIGH (ref 70–99)

## 2020-11-13 LAB — FERRITIN: Ferritin: 22 ng/mL (ref 11–307)

## 2020-11-13 LAB — FOLATE: Folate: 44.1 ng/mL (ref >5.9)

## 2020-11-13 LAB — MAGNESIUM: Magnesium: 1.8 mg/dL (ref 1.7–2.4)

## 2020-11-13 LAB — CORTISOL-AM, BLOOD: Cortisol - AM: 15.6 ug/dL (ref 6.7–22.6)

## 2020-11-13 LAB — VITAMIN B12: Vitamin B-12: 784 pg/mL (ref 180–914)

## 2020-11-13 LAB — CALCITRIOL (1,25 DI-OH VIT D): Vit D, 1,25-Dihydroxy: 40.8 pg/mL (ref 19.9–79.3)

## 2020-11-13 MED ORDER — ENSURE ENLIVE PO LIQD: 237.0000 mL | Freq: Two times a day (BID) | ORAL | 1 refills | Status: AC

## 2020-11-13 MED ORDER — LOSARTAN POTASSIUM 50 MG PO TABS: 50.0000 mg | ORAL_TABLET | Freq: Every day | ORAL | 1 refills | Status: DC

## 2020-11-13 MED ORDER — ENSURE ENLIVE PO LIQD: 237.0000 mL | Freq: Two times a day (BID) | ORAL | Status: DC

## 2020-11-13 MED ORDER — SODIUM CHLORIDE 0.9 % IV BOLUS
500.0000 mL | Freq: Once | INTRAVENOUS | Status: AC
  Administered 2020-11-13: 500 mL via INTRAVENOUS

## 2020-11-13 NOTE — TOC Transition Note (Signed)
Transition of Care [facility]) - CM/SW Discharge Note   Patient Details  Name: [name] MRN: [id number] Date of Birth: [birth year]  [facility] CM/SW Contact:  Lynnell Catalan, RN Phone Number: 11/13/2020, 2:50 PM   Clinical Narrative:     Pt is from [place] and will return there today. Her caregiver Enid Derry to take her back to the ALF. Spoke with Ermalinda Barrios at [facility] to alert of recommendations for HHPT/OT. Ermalinda Barrios states that she will alert their therapy department. DC summary sent to facility in the hub.

## 2020-11-13 NOTE — Progress Notes (Signed)
Initial Nutrition Assessment  INTERVENTION:   -Ensure Enlive po BID, each supplement provides 350 kcal and 20 grams of protein  NUTRITION DIAGNOSIS:   Inadequate oral intake related to lethargy/confusion,poor appetite as evidenced by meal completion < 25%.  GOAL:   Patient will meet greater than or equal to 90% of their needs  MONITOR:   PO intake,Supplement acceptance,Weight trends,Labs,I & O's  REASON FOR ASSESSMENT:   Consult Assessment of nutrition requirement/status  ASSESSMENT:   87 year old F with PMH of DM-2, diastolic CHF, HTN, primary hyperparathyroidism/hypercalcemia, anxiety, depression, cognitive impairment and recent acute bronchitis brought to ED due to progressive generalized weakness, poor appetite and an increased sleepiness.  She was recently treated with Augmentin and prednisone for acute bronchitis.  Patient currently consuming 25% of meals. Alert/oriented x 1. RD placed order for Ensure supplements given poor PO and orientation. Noted discharge order was placed.  Per weight records, weights have been trending up.  Medications reviewed.  Labs reviewed:  CBGs: 87-115  NUTRITION - FOCUSED PHYSICAL EXAM:  Deferred.  Diet Order:   Diet Order            Diet regular Room service appropriate? Yes; Fluid consistency: Thin  Diet effective now                 EDUCATION NEEDS:   No education needs have been identified at this time  Skin:  Skin Assessment: Reviewed RN Assessment  Last BM:  5/15  Height:   Ht Readings from Last 1 Encounters:  10/16/20 5\' 3"  (1.6 m)    Weight:   Wt Readings from Last 1 Encounters:  11/12/20 53.5 kg   BMI:  Body mass index is 20.9 kg/m.  Estimated Nutritional Needs:   Kcal:  1350-1550  Protein:  60-70g  Fluid:  1.5L/day   Clayton Bibles, MS, RD, LDN Inpatient Clinical Dietitian Contact information available via Amion

## 2020-11-13 NOTE — Discharge Summary (Signed)
Physician Discharge Summary  [name] [id number] DOB: Nov 21, 1925 DOA: 11/11/2020  PCP: Mast, Man X, NP  Admit date: 11/11/2020 Discharge date: 11/13/2020  [facility]  Recommendations for Outpatient Follow-up:  1. Follow ups as below. 2. Please obtain CBC/BMP/Mag at follow up 3. Please follow up on the following pending results: PTHrP and calcitriol levels  Home Health: PT/OT Equipment/Devices: Patient has walker  Discharge Condition: Stable CODE STATUS: DNR/DNI   Follow-up Information    Mast, Man X, NP. Schedule an appointment as soon as possible for a visit in 1 week(s).   Specialty: Internal Medicine Contact information: [address] [place] [address] [phone]               Hospital Course: 87 year old F with PMH of DM-2, diastolic CHF, HTN, primary hyperparathyroidism/hypercalcemia, anxiety, depression, cognitive impairment and recent acute bronchitis brought to ED due to progressive generalized weakness, poor appetite and an increased sleepiness.  She was recently treated with Augmentin and prednisone for acute bronchitis.  In ED, slightly hypertensive and tachypneic.  Normal saturation on RA.  Na 133. Cr/BUN 1.3/35 (normal baseline). Ca 11.7.  Lipase 53.  WBC 11.8.  Otherwise, CBC and CMP without significant finding.  Lactic acid 2.3.  Troponin 5x2.  UA with large LE and few bacteria.  COVID-19 and influenza PCR nonreactive.  CXR without acute finding.  Patient received IV fluid.  Cultures obtained.  Started on IV ceftriaxone for presumed UTI, and admitted.  Patient's AKI resolved with IV fluid hydration.  Weakness and hypercalcemia improved.  Leukocytosis and lactic acidosis resolved as well.  Urine culture with multiple species.  Regardless, patient received 3 doses of IV ceftriaxone which should be sufficient for UTI although she has not had UTI symptoms.  Work-up for her generalized  weakness/fatigue including TSH, CK, CRP, ESR, vitamin D level, B12 level, troponin, BNP and TTE unrevealing.  Patient received IV Zometa x1 for hypercalcemia.  PTH is within normal arguing against primary hyperparathyroidism. PTHrP is is pending at the time of discharge.  On the day of discharge, p.o. intake improved.  Dietitian recommended to ensure ambulated.  Weakness and fatigue improved as well.  Therapy recommended home health PT/OT.    See individual problem list below for more hospital course.  Discharge Diagnoses:  Generalized weakness/fatigue-could be due to dehydration, hypercalcemia or steroid-induced myopathy.   Extensive work-up as above, and unrevealing. -Continue PT/OT -Hydration -Discontinued Lasix  Hypercalcemia: Ca 11.7>> 9.9 (corrects to 10.7).    PTH 35 arguing against primary hypothyroidism.  Received Zometa x1 -Encourage oral hydration -Recheck in about a week.  AKI/dehydration-resolved. -Encourage oral hydration -Discontinued p.o. Lasix. -Recheck in about a week.  Lactic acidosis: Likely from dehydration and AKI.  Metformin could contribute.  Resolved.  Pyuria-patient denies UTI symptoms.  Urine culture with multiple species. -Received IV ceftriaxone x3  Uncontrolled NIDDM-2 with neuropathy: A1c 7.8%.  She only takes metformin at home. -Continue home metformin. -Liberate diet given decreased oral intake.  Essential hypertension: BP within fair range.  She has a wide pulse pressure.  TTE without significant finding.  See details below. -Continue low-dose home metoprolol and amlodipine. -Decrease losartan to 50 mg daily -Stop Lasix -Liberate diet  Chronic diastolic CHF: TTE in 8832 with LVEF of 60 to 65%, G1-DD but no other significant finding.  She does not appear fluid overloaded on exam.  Seems dehydrated on arrival.  On p.o. Lasix MWF at home.  Comes  in with dehydration in the setting of decreased oral intake.  Troponin and BNP are within  normal. -Stop Lasix -Monitor fluid status  Recurrent acute bronchitis-recently treated with Augmentin and prednisone.  CXR seems to be hyperinflated although no history of COPD or asthma.  No respiratory distress.  Anxiety and depression: Stable.  Does not seem to be on medication.  Cognitive impairment: Fairly oriented for age.  Likely age-related -Reorientation and delirium precautions -Avoid sedating medications -Maintain good hydration.  Decreased p.o. intake/poor appetite Body mass index is 20.9 kg/m. Nutrition Problem: Inadequate oral intake Etiology: lethargy/confusion,poor appetite Signs/Symptoms: meal completion < 25% Interventions: Ensure Enlive (each supplement provides 350kcal and 20 grams of protein)      Discharge Exam: Vitals:   11/13/20 0612 11/13/20 1402  BP: (!) 149/54 (!) 124/56  Pulse: 80 80  Resp: 17 18  Temp: 98 F (36.7 C) 97.9 F (36.6 C)  SpO2: 98% 98%    GENERAL: No apparent distress.  Nontoxic. HEENT: MMM.  Vision and hearing grossly intact.  NECK: Supple.  No apparent JVD.  RESP: On RA.  No IWOB.  Fair aeration bilaterally. CVS:  RRR. Heart sounds normal.  ABD/GI/GU: Bowel sounds present. Soft. Non tender.  MSK/EXT:  Moves extremities. No apparent deformity. No edema.  SKIN: no apparent skin lesion or wound NEURO: Sleepy but wakes to voice.  Oriented x4 except date of months.  No apparent focal neuro deficit. PSYCH: Calm. Normal affect.   Discharge Instructions  Discharge Instructions    (HEART FAILURE PATIENTS) Call MD:  Anytime you have any of the following symptoms: 1) 3 pound weight gain in 24 hours or 5 pounds in 1 week 2) shortness of breath, with or without a dry hacking cough 3) swelling in the hands, feet or stomach 4) if you have to sleep on extra pillows at night in order to breathe.   Complete by: As directed    Call MD for:  difficulty breathing, headache or visual disturbances   Complete by: As directed    Call MD  for:  extreme fatigue   Complete by: As directed    Call MD for:  persistant dizziness or light-headedness   Complete by: As directed    Diet general   Complete by: As directed    Discharge instructions   Complete by: As directed    It has been a pleasure taking care of you!  You were hospitalized due to generalized weakness and acute kidney injury likely from dehydration from not eating and drinking well.  Your weakness could also be due to recent steroid (prednisone) for your bronchitis.  Your symptoms and your kidney numbers improved after IV fluid hydration and stopping some of your blood pressure medications.  We made some adjustment to your blood pressure medications during this hospitalization.  Please review your new medication list and the directions on your medications before you take them.  Please follow-up with your primary care doctor in 1 to 2 weeks, and your cardiologist in 2 to 3 weeks or sooner if needed.   Take care,   Increase activity slowly   Complete by: As directed      Allergies as of 11/13/2020      Reactions   Bactrim    unknown   Betadine [povidone Iodine]    unknown   Metformin And Related    Hurt stomach   Nutmeg Oil (myristica Oil)    unknown   Sulfa Antibiotics    unknown  Medication List    STOP taking these medications   furosemide 20 MG tablet Commonly known as: LASIX     TAKE these medications   amLODipine 5 MG tablet Commonly known as: NORVASC Take 5 mg by mouth daily.   aspirin 81 MG EC tablet Take 1 tablet (81 mg total) by mouth daily.   betamethasone dipropionate 0.05 % ointment Commonly known as: DIPROLENE Apply topically. Apply BID PRN to active rash on extremities . 1 app immediately after shower. Twice A Day - PRN   diclofenac Sodium 1 % Gel Commonly known as: VOLTAREN Apply 2 g topically 2 (two) times daily as needed (knee pain). Apply to both knees   feeding supplement Liqd Take 237 mLs by mouth 2 (two) times  daily between meals.   losartan 50 MG tablet Commonly known as: Cozaar Take 1 tablet (50 mg total) by mouth daily. What changed:   medication strength  how much to take   metFORMIN 500 MG 24 hr tablet Commonly known as: GLUCOPHAGE-XR Take 250 mg by mouth 2 (two) times daily. Take 1/2 tablet   metoprolol tartrate 25 MG tablet Commonly known as: LOPRESSOR Take 12.5 mg by mouth 2 (two) times daily. Hold Metoprolol for SBP < 110 and DSP <50   multivitamins ther. w/minerals Tabs tablet Take 1 tablet by mouth daily. 400 mcg tablet   nitroGLYCERIN 0.4 MG SL tablet Commonly known as: NITROSTAT Place 0.4 mg under the tongue every 5 (five) minutes as needed for chest pain. PLACE 0.4 MG UNDER TONGUE EVERY 5 MINUTES X3 FOR CHEST PAIN, IF NO RELIEF, CALL MD OR SEND TO ED FOR FURTHER EVALUATION.       Consultations:  None  Procedures/Studies:  2D Echo on 11/12/2020  1. Left ventricular ejection fraction, by estimation, is 65 to 70%. The  left ventricle has normal function. The left ventricle has no regional  wall motion abnormalities. Left ventricular diastolic parameters are  consistent with Grade I diastolic  dysfunction (impaired relaxation).  2. Right ventricular systolic function is normal. The right ventricular  size is normal. There is normal pulmonary artery systolic pressure.  3. There is no evidence of pericardial effusion.  4. The mitral valve is degenerative. Mild mitral valve regurgitation.  Mild to moderate mitral stenosis. Moderate mitral annular calcification.  5. Tricuspid valve regurgitation is moderate.  6. The aortic valve is tricuspid. There is mild calcification of the  aortic valve. There is mild thickening of the aortic valve. Aortic valve  regurgitation is not visualized. Mild aortic valve sclerosis is present,  with no evidence of aortic valve  stenosis.  7. There is mild (Grade II) atheroma plaque involving the ascending  aorta.  8. The  inferior vena cava is normal in size with <50% respiratory  variability, suggesting right atrial pressure of 8 mmHg.   DG Chest Port 1 View  Result Date: 11/11/2020 CLINICAL DATA:  Shortness of breath with cough EXAM: PORTABLE CHEST 1 VIEW COMPARISON:  June 26, 2018 FINDINGS: Lungs are clear. Heart size and pulmonary vascularity are normal. No adenopathy. Postoperative change in lower cervical spine. Previous kyphoplasty lower thoracic region. Bones osteoporotic. Surgical clips noted in the superior left mediastinal region, stable. IMPRESSION: No edema or airspace opacity.  Heart size normal. Electronically Signed   By: Lowella Grip III M.D.   On: 11/11/2020 22:40   ECHOCARDIOGRAM COMPLETE  Result Date: 11/13/2020    ECHOCARDIOGRAM REPORT   Patient Name:   MONETTE OMARA Date of Exam:  11/12/2020 Medical Rec #:  [id number]      Height:       63.0 in Accession #:    [id number]     Weight:       118.0 lb Date of Birth:  July 27, 1925       BSA:          1.545 m Patient Age:    88 years       BP:           147/56 mmHg Patient Gender: F              HR:           66 bpm. Exam Location:  Inpatient Procedure: 2D Echo, Cardiac Doppler and Color Doppler Indications:     Dyspnea R06.00  History:         Patient has prior history of Echocardiogram examinations, most                  recent 06/28/2018. Risk Factors:Diabetes and Hypertension.                  GERD.  Sonographer:     Darlina Sicilian RDCS Referring Phys:  [id number] [place] Diagnosing Phys: Dixie Dials MD IMPRESSIONS  1. Left ventricular ejection fraction, by estimation, is 65 to 70%. The left ventricle has normal function. The left ventricle has no regional wall motion abnormalities. Left ventricular diastolic parameters are consistent with Grade I diastolic dysfunction (impaired relaxation).  2. Right ventricular systolic function is normal. The right ventricular size is normal. There is normal pulmonary artery systolic pressure.  3. There is no  evidence of pericardial effusion.  4. The mitral valve is degenerative. Mild mitral valve regurgitation. Mild to moderate mitral stenosis. Moderate mitral annular calcification.  5. Tricuspid valve regurgitation is moderate.  6. The aortic valve is tricuspid. There is mild calcification of the aortic valve. There is mild thickening of the aortic valve. Aortic valve regurgitation is not visualized. Mild aortic valve sclerosis is present, with no evidence of aortic valve stenosis.  7. There is mild (Grade II) atheroma plaque involving the ascending aorta.  8. The inferior vena cava is normal in size with <50% respiratory variability, suggesting right atrial pressure of 8 mmHg. FINDINGS  Left Ventricle: Left ventricular ejection fraction, by estimation, is 65 to 70%. The left ventricle has normal function. The left ventricle has no regional wall motion abnormalities. The left ventricular internal cavity size was normal in size. There is  no left ventricular hypertrophy. Left ventricular diastolic parameters are consistent with Grade I diastolic dysfunction (impaired relaxation). Right Ventricle: The right ventricular size is normal. No increase in right ventricular wall thickness. Right ventricular systolic function is normal. There is normal pulmonary artery systolic pressure. The tricuspid regurgitant velocity is 2.59 m/s, and  with an assumed right atrial pressure of 8 mmHg, the estimated right ventricular systolic pressure is 52.0 mmHg. Left Atrium: Left atrial size was normal in size. Right Atrium: Right atrial size was normal in size. Pericardium: There is no evidence of pericardial effusion. Mitral Valve: The mitral valve is degenerative in appearance. There is moderate calcification of the mitral valve leaflet(s). Moderate mitral annular calcification. Mild mitral valve regurgitation. Mild to moderate mitral valve stenosis. MV peak gradient, 10.5 mmHg. The mean mitral valve gradient is 3.0 mmHg. Tricuspid  Valve: The tricuspid valve is normal in structure. Tricuspid valve regurgitation is moderate. Aortic Valve: The aortic valve is tricuspid. There is  mild calcification of the aortic valve. There is mild thickening of the aortic valve. There is mild aortic valve annular calcification. Aortic valve regurgitation is not visualized. Mild aortic valve sclerosis is present, with no evidence of aortic valve stenosis. Pulmonic Valve: The pulmonic valve was normal in structure. Pulmonic valve regurgitation is mild. Aorta: The aortic root is normal in size and structure. There is mild (Grade II) atheroma plaque involving the ascending aorta. Venous: The inferior vena cava is normal in size with less than 50% respiratory variability, suggesting right atrial pressure of 8 mmHg. IAS/Shunts: No atrial level shunt detected by color flow Doppler.  LEFT VENTRICLE PLAX 2D LVIDd:         3.86 cm     Diastology LVIDs:         2.25 cm     LV e' medial:    5.44 cm/s LV PW:         0.74 cm     LV E/e' medial:  15.7 LV IVS:        0.68 cm     LV e' lateral:   5.77 cm/s LVOT diam:     1.60 cm     LV E/e' lateral: 14.8 LV SV:         35 LV SV Index:   23 LVOT Area:     2.01 cm  LV Volumes (MOD) LV vol d, MOD A2C: 59.3 ml LV vol d, MOD A4C: 76.2 ml LV vol s, MOD A2C: 18.1 ml LV vol s, MOD A4C: 21.3 ml LV SV MOD A2C:     41.2 ml LV SV MOD A4C:     76.2 ml LV SV MOD BP:      47.9 ml RIGHT VENTRICLE RV S prime:     9.14 cm/s TAPSE (M-mode): 0.9 cm LEFT ATRIUM             Index       RIGHT ATRIUM          Index LA diam:        3.10 cm 2.01 cm/m  RA Area:     9.10 cm LA Vol (A2C):   32.1 ml 20.77 ml/m RA Volume:   16.40 ml 10.61 ml/m LA Vol (A4C):   20.9 ml 13.53 ml/m LA Biplane Vol: 26.6 ml 17.21 ml/m  AORTIC VALVE LVOT Vmax:   86.60 cm/s LVOT Vmean:  52.600 cm/s LVOT VTI:    0.175 m  AORTA Ao Root diam: 2.30 cm MITRAL VALVE                TRICUSPID VALVE MV Area (PHT): 4.60 cm     TR Peak grad:   26.8 mmHg MV Peak grad:  10.5 mmHg    TR  Vmax:        259.00 cm/s MV Mean grad:  3.0 mmHg MV Vmax:       1.62 m/s     SHUNTS MV Vmean:      82.7 cm/s    Systemic VTI:  0.18 m MV Decel Time: 165 msec     Systemic Diam: 1.60 cm MV E velocity: 85.20 cm/s MV A velocity: 141.00 cm/s MV E/A ratio:  0.60 Dixie Dials MD Electronically signed by Dixie Dials MD Signature Date/Time: 11/13/2020/1:25:26 PM    Final        The results of significant diagnostics from this hospitalization (including imaging, microbiology, ancillary and laboratory) are listed below for reference.     Microbiology: Recent  Results (from the past 240 hour(s))  Urine culture     Status: Abnormal   Collection Time: 11/11/20  8:52 PM   Specimen: Urine, Random  Result Value Ref Range Status   Specimen Description   Final    URINE, RANDOM Performed at [facility] [address] [place], [place] [address]    Special Requests   Final    NONE Performed at [facility], [place] [address], [place], [place] [address]    Culture MULTIPLE SPECIES PRESENT, SUGGEST RECOLLECTION (A)  Final   Report Status 11/13/2020 FINAL  Final  Resp Panel by RT-PCR (Flu A&B, Covid) Nasopharyngeal Swab     Status: None   Collection Time: 11/11/20  8:52 PM   Specimen: Nasopharyngeal Swab; Nasopharyngeal(NP) swabs in vial transport medium  Result Value Ref Range Status   SARS Coronavirus 2 by RT PCR NEGATIVE NEGATIVE Final    Comment: (NOTE) SARS-CoV-2 target nucleic acids are NOT DETECTED.  The SARS-CoV-2 RNA is generally detectable in upper respiratory specimens during the acute phase of infection. The lowest concentration of SARS-CoV-2 viral copies this assay can detect is 138 copies/mL. A negative result does not preclude SARS-Cov-2 infection and should not be used as the sole basis for treatment or other patient management decisions. A negative result may occur with  improper specimen collection/handling, submission of specimen other than  nasopharyngeal swab, presence of viral mutation(s) within the areas targeted by this assay, and inadequate number of viral copies(<138 copies/mL). A negative result must be combined with clinical observations, patient history, and epidemiological information. The expected result is Negative.  Fact Sheet for Patients:  EntrepreneurPulse.com.au  Fact Sheet for Healthcare Providers:  IncredibleEmployment.be  This test is no t yet approved or cleared by the [place] FDA and  has been authorized for detection and/or diagnosis of SARS-CoV-2 by FDA under an Emergency Use Authorization (EUA). This EUA will remain  in effect (meaning this test can be used) for the duration of the COVID-19 declaration under Section 564(b)(1) of the Act, 21 U.S.C.section 360bbb-3(b)(1), unless the authorization is terminated  or revoked sooner.       Influenza A by PCR NEGATIVE NEGATIVE Final   Influenza B by PCR NEGATIVE NEGATIVE Final    Comment: (NOTE) The Xpert Xpress SARS-CoV-2/FLU/RSV plus assay is intended as an aid in the diagnosis of influenza from Nasopharyngeal swab specimens and should not be used as a sole basis for treatment. Nasal washings and aspirates are unacceptable for Xpert Xpress SARS-CoV-2/FLU/RSV testing.  Fact Sheet for Patients: EntrepreneurPulse.com.au  Fact Sheet for Healthcare Providers: IncredibleEmployment.be  This test is not yet approved or cleared by the [place] FDA and has been authorized for detection and/or diagnosis of SARS-CoV-2 by FDA under an Emergency Use Authorization (EUA). This EUA will remain in effect (meaning this test can be used) for the duration of the COVID-19 declaration under Section 564(b)(1) of the Act, 21 U.S.C. section 360bbb-3(b)(1), unless the authorization is terminated or revoked.  Performed at [facility], [place] [address] [place], [place] [address]   Culture, blood (routine x 2)     Status: None (Preliminary result)   Collection Time: 11/11/20  8:53 PM   Specimen: BLOOD  Result Value Ref Range Status   Specimen Description   Final    BLOOD RIGHT ANTECUBITAL Performed at [facility], [place] [address] [place], [place] [address]    Special Requests   Final  BOTTLES DRAWN AEROBIC AND ANAEROBIC Blood Culture adequate volume Performed at [facility] [address], [place], [place] [address]    Culture   Final    NO GROWTH 1 DAY Performed at [facility], [place] [address] [place], [place] [address]    Report Status PENDING  Incomplete  Culture, blood (routine x 2)     Status: None (Preliminary result)   Collection Time: 11/11/20  8:53 PM   Specimen: BLOOD  Result Value Ref Range Status   Specimen Description   Final    BLOOD LEFT ANTECUBITAL Performed at [facility], [place] [address] [place], [place] [address]    Special Requests   Final    BOTTLES DRAWN AEROBIC AND ANAEROBIC Blood Culture adequate volume Performed at [facility] [address], [place], [place] [address]    Culture   Final    NO GROWTH 1 DAY Performed at [facility], [place] [address] [place], [place] [address]    Report Status PENDING  Incomplete     Labs:  CBC: Recent Labs  Lab 11/11/20 2052 11/12/20 0547 11/13/20 0628  WBC 11.8* 12.8* 8.7  NEUTROABS 7.0  --   --   HGB 13.2 11.8* 11.3*  HCT 39.1 35.1* 34.2*  MCV 91.4 91.9 94.0  PLT 322 271 242   BMP &GFR Recent Labs  Lab 11/11/20 2052 11/12/20 0547 11/12/20 1500 11/13/20 0628  NA 133* 133*  --  136  K 4.4 4.1  --  4.0  CL 100 104  --  106  CO2 23 23  --  24  GLUCOSE 260* 174*  --  116*  BUN 35* 28*  --  17  CREATININE 1.30* 0.89  --  0.78  CALCIUM 11.7* 10.4* 10.6* 9.9  MG  --   --   --  1.8  PHOS  --   --   --  3.0   Estimated Creatinine Clearance: 36.3 mL/min (by C-G formula based  on SCr of 0.78 mg/dL). Liver & Pancreas: Recent Labs  Lab 11/11/20 2052 11/12/20 0547 11/13/20 0628  AST 21 18  --   ALT 23 20  --   ALKPHOS 65 55  --   BILITOT 0.8 0.7  --   PROT 6.9 5.5*  --   ALBUMIN 4.2 3.3* 3.0*   Recent Labs  Lab 11/11/20 2052  LIPASE 53*   No results for input(s): AMMONIA in the last 168 hours. Diabetic: Recent Labs    11/12/20 0547  HGBA1C 7.8*   Recent Labs  Lab 11/12/20 1242 11/12/20 1658 11/12/20 2137 11/13/20 0802 11/13/20 1204  GLUCAP 154* 87 115* 112* 120*   Cardiac Enzymes: Recent Labs  Lab 11/12/20 0547  CKTOTAL 22*   No results for input(s): PROBNP in the last 8760 hours. Coagulation Profile: Recent Labs  Lab 11/11/20 2052  INR 1.0   Thyroid Function Tests: Recent Labs    11/12/20 0547  TSH 0.534   Lipid Profile: No results for input(s): CHOL, HDL, LDLCALC, TRIG, CHOLHDL, LDLDIRECT in the last 72 hours. Anemia Panel: Recent Labs    11/13/20 0628  VITAMINB12 784  FOLATE 44.1  FERRITIN 22  TIBC 307  IRON 70  RETICCTPCT 1.0   Urine analysis:    Component Value Date/Time   COLORURINE YELLOW 11/11/2020 2052   APPEARANCEUR CLEAR 11/11/2020 2052   LABSPEC <1.005 (L) 11/11/2020 2052   PHURINE 6.0 11/11/2020 2052   GLUCOSEU NEGATIVE 11/11/2020  2052   HGBUR NEGATIVE 11/11/2020 2052   BILIRUBINUR NEGATIVE 11/11/2020 2052   [place] 11/11/2020 2052   PROTEINUR NEGATIVE 11/11/2020 2052   UROBILINOGEN 0.2 01/19/2008 1015   NITRITE NEGATIVE 11/11/2020 2052   LEUKOCYTESUR LARGE (A) 11/11/2020 2052   Sepsis Labs: Invalid input(s): PROCALCITONIN, LACTICIDVEN   Time coordinating discharge: 45 minutes  SIGNED:  Mercy Riding, MD  [facility] 11/13/2020, 2:49 PM  If 7PM-7AM, please contact night-coverage [email or web address]

## 2020-11-14 ENCOUNTER — Non-Acute Institutional Stay: Payer: Medicare Other | Admitting: Nurse Practitioner

## 2020-11-14 ENCOUNTER — Encounter: Payer: Self-pay | Admitting: Nurse Practitioner

## 2020-11-14 DIAGNOSIS — N179 Acute kidney failure, unspecified: Secondary | ICD-10-CM | POA: Diagnosis not present

## 2020-11-14 DIAGNOSIS — I5032 Chronic diastolic (congestive) heart failure: Secondary | ICD-10-CM

## 2020-11-14 DIAGNOSIS — E1142 Type 2 diabetes mellitus with diabetic polyneuropathy: Secondary | ICD-10-CM | POA: Diagnosis not present

## 2020-11-14 DIAGNOSIS — I10 Essential (primary) hypertension: Secondary | ICD-10-CM | POA: Diagnosis not present

## 2020-11-14 DIAGNOSIS — I5030 Unspecified diastolic (congestive) heart failure: Secondary | ICD-10-CM | POA: Insufficient documentation

## 2020-11-14 DIAGNOSIS — R4189 Other symptoms and signs involving cognitive functions and awareness: Secondary | ICD-10-CM

## 2020-11-14 NOTE — Progress Notes (Signed)
Location:   [facility] Room Number: NA355 Place of Service:  ALF (13) Provider: Lennie Odor Oriyah Lamphear NP  Juliah Scadden X, NP  Patient Care Team: Edu On X, NP as PCP - General (Internal Medicine) [place] Randon Somera X, NP as Nurse Practitioner (Nurse Practitioner) Jacelyn Pi, MD as Consulting Physician (Endocrinology) Calvert Cantor, MD as Consulting Physician (Ophthalmology) Vickey Huger, MD as Consulting Physician (Orthopedic Surgery) Suella Broad, MD as Consulting Physician (Physical Medicine and Rehabilitation) Erline Levine, MD as Consulting Physician (Neurosurgery) Juanita Craver, MD as Consulting Physician (Gastroenterology) Martin[place], Peter M, MD as Consulting Physician (Cardiology) Megan Salon, MD as Consulting Physician (Gynecology)  Extended Emergency Contact Information Primary Emergency Contact: Ladene Artist of [place] Phone: [phone] Relation: Niece Secondary Emergency Contact: Edmon Crape Mobile Phone: [phone] Relation: Other Preferred language: Cleophus Molt Interpreter needed? No Guardian: Meryl Dare Mobile Phone: [phone] Relation: Legal Guardian Preferred language: English Interpreter needed? No  Code Status: DNR Goals of care: Advanced Directive information Advanced Directives 11/14/2020  Does Patient Have a Medical Advance Directive? Yes  Type of Paramedic of [place];Living will;Out of facility DNR (pink MOST or yellow form)  Does patient want to make changes to medical advance directive? No - Patient declined  Copy of [place] in Chart? Yes - validated most recent copy scanned in chart (See row information)  Would patient like information on creating a medical advance directive? -  Pre-existing out of facility DNR order (yellow form or pink MOST form) Yellow form placed in chart (order not valid for inpatient use)     Chief Complaint  Patient presents  with  . Follow-up    Patient presents for a medication review after hospital visit.     HPI:  Pt is a 87 y.o. female seen today for an acute visit for medication review following hospital stay.   Hospitalized 11/11/20-11/13/20 for elevated blood pressure, tachycardia, Na 133, Bun 35, creat 1.3, Ca 11.7, received IVF, Ceftriaxone, x1 Zometa,  AK, hypercalcemia, weakness, tachycardia improved,  f/u CBC/BMP, pending PTHrP/Calcitriol level. Furosemide was discontinued.   Hypercalcemia, Ca 11.7>>9.9(corrected to 10.7, PTH 35, s/p Zometa x1, pending PTHrP/Calcitriol level. Hyperparathyroidism, workup with Dr. Chalmers Cater, Ca 10.5 10/19/20  AKI resolved, off Furosemide, update BMP  T2DM, Hgb a1c 7.8%, takes Metformin  HTN, takes Losartan, Metoprolol, Amlodipine.   CHF, chronic, diastolic, EF 10-62%, BNP wnl  Cognitive impairment, at her baseline.     Past Medical History:  Diagnosis Date  . Contact dermatitis and other eczema, due to unspecified cause   . Cortical senile cataract   . GERD (gastroesophageal reflux disease) 02/17/2013  . Hyperparathyroidism, unspecified ([place]) 03/26/2010  . Irritable bowel syndrome   . Keratoconjunctivitis sicca, not specified as Sjogren's   . Loss of weight   . Macular degeneration (senile) of retina, unspecified   . Memory loss   . Other and unspecified hyperlipidemia   . Other malaise and fatigue 06/17/2010  . Pain in joint, site unspecified   . Pathologic fracture of vertebrae   . Reflux esophagitis   . Scoliosis (and kyphoscoliosis), idiopathic   . Senile osteoporosis   . Type II or unspecified type diabetes mellitus without mention of complication, uncontrolled   . Unspecified essential hypertension   . Unspecified hereditary and idiopathic peripheral neuropathy   . Unspecified vitamin D deficiency    Past Surgical History:  Procedure Laterality Date  . APPENDECTOMY    . BREAST LUMPECTOMY  02/05/2010  Dr. Marca Ancona  . CATARACT EXTRACTION W/ INTRAOCULAR  LENS  IMPLANT, BILATERAL  04/2013   Dr. Hazle Quant  . CHOLECYSTECTOMY  2004   Dr. Abbey Chatters  . COLONOSCOPY  2001   Dr. Loreta Ave  . fibroidectomy  1950  . HEMORRHOID SURGERY  1978  . KYPHOPLASTY  2010   T10 & T12 Dr. Maeola Harman  . MEDIAL PARTIAL KNEE REPLACEMENT Left 2004   Dr. Sherlean Foot  . SPINE SURGERY     vertebroplasty T10, T12  . SPINE SURGERY  11/02/2001    C4-5 & C5-6 Titanium Plate Placed Dr. Channing Mutters  . THYROIDECTOMY, PARTIAL Left 2007  . TOTAL KNEE ARTHROPLASTY Right 2009   Dr. Charlann Boxer     Allergies  Allergen Reactions  . Bactrim     unknown  . Betadine [Povidone Iodine]     unknown  . Metformin And Related     Hurt stomach  . Nutmeg Oil (Myristica Oil)     unknown  . Sulfa Antibiotics     unknown    Allergies as of 11/14/2020      Reactions   Bactrim    unknown   Betadine [povidone Iodine]    unknown   Metformin And Related    Hurt stomach   Nutmeg Oil (myristica Oil)    unknown   Sulfa Antibiotics    unknown      Medication List       Accurate as of Nov 14, 2020  1:51 PM. If you have any questions, ask your nurse or doctor.        amLODipine 5 MG tablet Commonly known as: NORVASC Take 5 mg by mouth daily.   aspirin 81 MG EC tablet Take 1 tablet (81 mg total) by mouth daily.   betamethasone dipropionate 0.05 % ointment Commonly known as: DIPROLENE Apply topically. Apply BID PRN to active rash on extremities . 1 app immediately after shower. Twice A Day - PRN   diclofenac Sodium 1 % Gel Commonly known as: VOLTAREN Apply 2 g topically 2 (two) times daily as needed (knee pain). Apply to both knees   feeding supplement Liqd Take 237 mLs by mouth 2 (two) times daily between meals.   losartan 50 MG tablet Commonly known as: Cozaar Take 1 tablet (50 mg total) by mouth daily.   metFORMIN 500 MG 24 hr tablet Commonly known as: GLUCOPHAGE-XR Take 250 mg by mouth 2 (two) times daily. Take 1/2 tablet   metoprolol tartrate 25 MG tablet Commonly known as:  LOPRESSOR Take 12.5 mg by mouth 2 (two) times daily. Hold Metoprolol for SBP < 110 and DSP <50   multivitamins ther. w/minerals Tabs tablet Take 1 tablet by mouth daily. 400 mcg tablet   nitroGLYCERIN 0.4 MG SL tablet Commonly known as: NITROSTAT Place 0.4 mg under the tongue every 5 (five) minutes as needed for chest pain. PLACE 0.4 MG UNDER TONGUE EVERY 5 MINUTES X3 FOR CHEST PAIN, IF NO RELIEF, CALL MD OR SEND TO ED FOR FURTHER EVALUATION.       Review of Systems  Constitutional: Positive for activity change and appetite change. Negative for fever.  HENT: Positive for hearing loss. Negative for congestion and voice change.   Eyes: Negative for visual disturbance.  Respiratory: Positive for shortness of breath. Negative for cough and wheezing.        DOE  Cardiovascular: Negative for leg swelling.  Gastrointestinal: Negative for abdominal pain and constipation.  Genitourinary: Negative for difficulty urinating, dysuria and urgency.  Musculoskeletal: Positive for arthralgias, back pain and gait problem.  Skin: Negative for color change.  Neurological: Negative for weakness and light-headedness.       Memory lapses.   Psychiatric/Behavioral: Negative for agitation and sleep disturbance. The patient is not nervous/anxious.     Immunization History  Administered Date(s) Administered  . Influenza Whole 03/30/2012, 04/13/2013, 04/04/2018  . Influenza, High Dose Seasonal PF 04/02/2019  . Influenza-Unspecified 04/17/2014, 03/29/2015  . Moderna SARS-COV2 Booster Vaccination 05/08/2020  . Moderna Sars-Covid-2 Vaccination 07/02/2019, 07/30/2019  . Pneumococcal Conjugate-13 12/08/2017  . Pneumococcal Polysaccharide-23 06/30/2001  . Td 06/30/2001, 12/08/2017   Pertinent  Health Maintenance Due  Topic Date Due  . FOOT EXAM  12/21/2015  . OPHTHALMOLOGY EXAM  04/02/2016  . INFLUENZA VACCINE  01/28/2021  . HEMOGLOBIN A1C  05/15/2021  . DEXA SCAN  Completed  . PNA vac Low Risk Adult   Completed   Fall Risk  11/24/2017 07/09/2017 11/01/2015 07/26/2015 06/21/2015  Falls in the past year? No No No No No   Functional Status Survey:    Vitals:   11/14/20 1042  BP: (!) 154/70  Pulse: 72  Resp: 20  Temp: 98.1 F (36.7 C)  SpO2: 96%  Weight: 118 lb 6.4 oz (53.7 kg)  Height: $Remove'5\' 3"'HSCwMgD$  (1.6 m)   Body mass index is 20.97 kg/m. Physical Exam Vitals and nursing note reviewed.  Constitutional:      Comments: tired  HENT:     Head: Normocephalic and atraumatic.     Mouth/Throat:     Mouth: Mucous membranes are moist.  Eyes:     Extraocular Movements: Extraocular movements intact.     Conjunctiva/sclera: Conjunctivae normal.     Pupils: Pupils are equal, round, and reactive to light.  Cardiovascular:     Rate and Rhythm: Normal rate and regular rhythm.     Heart sounds: No murmur heard.   Pulmonary:     Breath sounds: No wheezing or rales.  Abdominal:     General: Bowel sounds are normal.     Palpations: Abdomen is soft.     Tenderness: There is no abdominal tenderness.  Musculoskeletal:     Cervical back: Normal range of motion and neck supple.     Right lower leg: No edema.     Left lower leg: No edema.     Comments: Kyphoscoliosis.   Skin:    General: Skin is warm and dry.  Neurological:     General: No focal deficit present.     Mental Status: She is alert. Mental status is at baseline.     Gait: Gait abnormal.     Comments: Oriented to person, place.   Psychiatric:        Mood and Affect: Mood normal.        Behavior: Behavior normal.        Thought Content: Thought content normal.        Judgment: Judgment normal.     Labs reviewed: Recent Labs    11/11/20 2052 11/12/20 0547 11/12/20 1500 11/13/20 0628  NA 133* 133*  --  136  K 4.4 4.1  --  4.0  CL 100 104  --  106  CO2 23 23  --  24  GLUCOSE 260* 174*  --  116*  BUN 35* 28*  --  17  CREATININE 1.30* 0.89  --  0.78  CALCIUM 11.7* 10.4* 10.6* 9.9  MG  --   --   --  1.8  PHOS  --   --    --  3.0   Recent Labs    03/13/20 0000 11/11/20 2052 11/12/20 0547 11/13/20 0628  AST $Re'20 21 18  'Mak$ --   ALT $Re'15 23 20  'NBh$ --   ALKPHOS 79 65 55  --   BILITOT  --  0.8 0.7  --   PROT  --  6.9 5.5*  --   ALBUMIN 4.0 4.2 3.3* 3.0*   Recent Labs    03/13/20 0000 11/11/20 2052 11/12/20 0547 11/13/20 0628  WBC 10.9 11.8* 12.8* 8.7  NEUTROABS 5,216.00 7.0  --   --   HGB 12.5 13.2 11.8* 11.3*  HCT 36 39.1 35.1* 34.2*  MCV  --  91.4 91.9 94.0  PLT 284 322 271 242   Lab Results  Component Value Date   TSH 0.534 11/12/2020   Lab Results  Component Value Date   HGBA1C 7.8 (H) 11/12/2020   Lab Results  Component Value Date   CHOL 192 03/13/2020   HDL 66 03/13/2020   LDLCALC 107 03/13/2020   TRIG 95 03/13/2020   CHOLHDL 2.4 06/27/2018    Significant Diagnostic Results in last 30 days:  DG Chest Port 1 View  Result Date: 11/11/2020 CLINICAL DATA:  Shortness of breath with cough EXAM: PORTABLE CHEST 1 VIEW COMPARISON:  June 26, 2018 FINDINGS: Lungs are clear. Heart size and pulmonary vascularity are normal. No adenopathy. Postoperative change in lower cervical spine. Previous kyphoplasty lower thoracic region. Bones osteoporotic. Surgical clips noted in the superior left mediastinal region, stable. IMPRESSION: No edema or airspace opacity.  Heart size normal. Electronically Signed   By: Lowella Grip III M.D.   On: 11/11/2020 22:40   ECHOCARDIOGRAM COMPLETE  Result Date: 11/13/2020    ECHOCARDIOGRAM REPORT   Patient Name:   [name] Date of Exam: 11/12/2020 Medical Rec #:  [id number]      Height:       63.0 in Accession #:    [id number]     Weight:       118.0 lb Date of Birth:  1926-03-16       BSA:          1.545 m Patient Age:    50 years       BP:           147/56 mmHg Patient Gender: F              HR:           66 bpm. Exam Location:  Inpatient Procedure: 2D Echo, Cardiac Doppler and Color Doppler Indications:     Dyspnea R06.00  History:         Patient has prior  history of Echocardiogram examinations, most                  recent 06/28/2018. Risk Factors:Diabetes and Hypertension.                  GERD.  Sonographer:     Darlina Sicilian RDCS Referring Phys:  [id number] [place] Diagnosing Phys: Dixie Dials MD IMPRESSIONS  1. Left ventricular ejection fraction, by estimation, is 65 to 70%. The left ventricle has normal function. The left ventricle has no regional wall motion abnormalities. Left ventricular diastolic parameters are consistent with Grade I diastolic dysfunction (impaired relaxation).  2. Right ventricular systolic function is normal. The right ventricular size is normal. There is normal pulmonary artery systolic pressure.  3. There is no evidence of pericardial effusion.  4. The mitral valve  is degenerative. Mild mitral valve regurgitation. Mild to moderate mitral stenosis. Moderate mitral annular calcification.  5. Tricuspid valve regurgitation is moderate.  6. The aortic valve is tricuspid. There is mild calcification of the aortic valve. There is mild thickening of the aortic valve. Aortic valve regurgitation is not visualized. Mild aortic valve sclerosis is present, with no evidence of aortic valve stenosis.  7. There is mild (Grade II) atheroma plaque involving the ascending aorta.  8. The inferior vena cava is normal in size with <50% respiratory variability, suggesting right atrial pressure of 8 mmHg. FINDINGS  Left Ventricle: Left ventricular ejection fraction, by estimation, is 65 to 70%. The left ventricle has normal function. The left ventricle has no regional wall motion abnormalities. The left ventricular internal cavity size was normal in size. There is  no left ventricular hypertrophy. Left ventricular diastolic parameters are consistent with Grade I diastolic dysfunction (impaired relaxation). Right Ventricle: The right ventricular size is normal. No increase in right ventricular wall thickness. Right ventricular systolic function is normal.  There is normal pulmonary artery systolic pressure. The tricuspid regurgitant velocity is 2.59 m/s, and  with an assumed right atrial pressure of 8 mmHg, the estimated right ventricular systolic pressure is 34.8 mmHg. Left Atrium: Left atrial size was normal in size. Right Atrium: Right atrial size was normal in size. Pericardium: There is no evidence of pericardial effusion. Mitral Valve: The mitral valve is degenerative in appearance. There is moderate calcification of the mitral valve leaflet(s). Moderate mitral annular calcification. Mild mitral valve regurgitation. Mild to moderate mitral valve stenosis. MV peak gradient, 10.5 mmHg. The mean mitral valve gradient is 3.0 mmHg. Tricuspid Valve: The tricuspid valve is normal in structure. Tricuspid valve regurgitation is moderate. Aortic Valve: The aortic valve is tricuspid. There is mild calcification of the aortic valve. There is mild thickening of the aortic valve. There is mild aortic valve annular calcification. Aortic valve regurgitation is not visualized. Mild aortic valve sclerosis is present, with no evidence of aortic valve stenosis. Pulmonic Valve: The pulmonic valve was normal in structure. Pulmonic valve regurgitation is mild. Aorta: The aortic root is normal in size and structure. There is mild (Grade II) atheroma plaque involving the ascending aorta. Venous: The inferior vena cava is normal in size with less than 50% respiratory variability, suggesting right atrial pressure of 8 mmHg. IAS/Shunts: No atrial level shunt detected by color flow Doppler.  LEFT VENTRICLE PLAX 2D LVIDd:         3.86 cm     Diastology LVIDs:         2.25 cm     LV e' medial:    5.44 cm/s LV PW:         0.74 cm     LV E/e' medial:  15.7 LV IVS:        0.68 cm     LV e' lateral:   5.77 cm/s LVOT diam:     1.60 cm     LV E/e' lateral: 14.8 LV SV:         35 LV SV Index:   23 LVOT Area:     2.01 cm  LV Volumes (MOD) LV vol d, MOD A2C: 59.3 ml LV vol d, MOD A4C: 76.2 ml LV vol  s, MOD A2C: 18.1 ml LV vol s, MOD A4C: 21.3 ml LV SV MOD A2C:     41.2 ml LV SV MOD A4C:     76.2 ml LV SV MOD BP:  47.9 ml RIGHT VENTRICLE RV S prime:     9.14 cm/s TAPSE (M-mode): 0.9 cm LEFT ATRIUM             Index       RIGHT ATRIUM          Index LA diam:        3.10 cm 2.01 cm/m  RA Area:     9.10 cm LA Vol (A2C):   32.1 ml 20.77 ml/m RA Volume:   16.40 ml 10.61 ml/m LA Vol (A4C):   20.9 ml 13.53 ml/m LA Biplane Vol: 26.6 ml 17.21 ml/m  AORTIC VALVE LVOT Vmax:   86.60 cm/s LVOT Vmean:  52.600 cm/s LVOT VTI:    0.175 m  AORTA Ao Root diam: 2.30 cm MITRAL VALVE                TRICUSPID VALVE MV Area (PHT): 4.60 cm     TR Peak grad:   26.8 mmHg MV Peak grad:  10.5 mmHg    TR Vmax:        259.00 cm/s MV Mean grad:  3.0 mmHg MV Vmax:       1.62 m/s     SHUNTS MV Vmean:      82.7 cm/s    Systemic VTI:  0.18 m MV Decel Time: 165 msec     Systemic Diam: 1.60 cm MV E velocity: 85.20 cm/s MV A velocity: 141.00 cm/s MV E/A ratio:  0.60 Dixie Dials MD Electronically signed by Dixie Dials MD Signature Date/Time: 11/13/2020/1:25:26 PM    Final     Assessment/Plan: Hypercalcemia Hypercalcemia, Ca 11.7>>9.9(corrected to 10.7, PTH 35, s/p Zometa x1, pending PTHrP/Calcitriol level. Hyperparathyroidism, workup with Dr. Chalmers Cater, Ca 10.5 10/19/20   ARF (acute renal failure) ([place]) resolved, off Furosemide, update BMP  DM type 2 with diabetic peripheral neuropathy (HCC) Hgb a1c 7.8%, takes Metformin   Essential hypertension takes Losartan, Metoprolol, Amlodipine.    Diastolic CHF (HCC) chronic, diastolic, EF 76-54%, BNP wnl   Cognitive impairment Cognitive impairment, at her baseline.      Family/ staff Communication: plan of care reviewed with the patient and charge nurse.   Labs/tests ordered:  CBC/diff, CMP/eGFR, Mg  Time spend 40 minutes.

## 2020-11-14 NOTE — Assessment & Plan Note (Signed)
resolved, off Furosemide, update BMP

## 2020-11-14 NOTE — Assessment & Plan Note (Addendum)
Hypercalcemia, Ca 11.7>>9.9(corrected to 10.7, PTH 35, s/p Zometa x1, pending PTHrP/Calcitriol level. Hyperparathyroidism, workup with Dr. Chalmers Cater, Ca 10.5 10/19/20

## 2020-11-14 NOTE — Assessment & Plan Note (Signed)
chronic, diastolic, EF 37-04%, BNP wnl

## 2020-11-14 NOTE — Assessment & Plan Note (Signed)
Hgb a1c 7.8%, takes Metformin

## 2020-11-14 NOTE — Assessment & Plan Note (Signed)
takes Losartan, Metoprolol, Amlodipine.  

## 2020-11-14 NOTE — Progress Notes (Signed)
Location:   [facility] Room Number: [phone] Place of Service:  ALF [phone]) Provider:  Man X Mast, NP  Mast, Man X, NP  Patient Care Team: Mast, Man X, NP as PCP - General (Internal Medicine) [place] Mast, Man X, NP as Nurse Practitioner (Nurse Practitioner) Jacelyn Pi, MD as Consulting Physician (Endocrinology) Calvert Cantor, MD as Consulting Physician (Ophthalmology) Vickey Huger, MD as Consulting Physician (Orthopedic Surgery) Suella Broad, MD as Consulting Physician (Physical Medicine and Rehabilitation) Erline Levine, MD as Consulting Physician (Neurosurgery) Juanita Craver, MD as Consulting Physician (Gastroenterology) Martinique, Peter M, MD as Consulting Physician (Cardiology) Megan Salon, MD as Consulting Physician (Gynecology)  Extended Emergency Contact Information Primary Emergency Contact: Ladene Artist of [place] Phone: [phone] Relation: Niece Secondary Emergency Contact: Edmon Crape Mobile Phone: [phone] Relation: Other Preferred language: Cleophus Molt Interpreter needed? No Guardian: Meryl Dare Mobile Phone: [phone] Relation: Legal Guardian Preferred language: English Interpreter needed? No  Code Status:  DNR Goals of care: Advanced Directive information Advanced Directives 11/14/2020  Does Patient Have a Medical Advance Directive? Yes  Type of Paramedic of [place];Living will;Out of facility DNR (pink MOST or yellow form)  Does patient want to make changes to medical advance directive? No - Patient declined  Copy of [place] in Chart? Yes - validated most recent copy scanned in chart (See row information)  Would patient like information on creating a medical advance directive? -  Pre-existing out of facility DNR order (yellow form or pink MOST form) Yellow form placed in chart (order not valid for inpatient use)     Chief Complaint   Patient presents with  . Follow-up    Patient presents for a medication review after hospital visit.     HPI:  Pt is a 87 y.o. female seen today for medical management of chronic diseases.     Past Medical History:  Diagnosis Date  . Contact dermatitis and other eczema, due to unspecified cause   . Cortical senile cataract   . GERD (gastroesophageal reflux disease) 02/17/2013  . Hyperparathyroidism, unspecified ([place]) 03/26/2010  . Irritable bowel syndrome   . Keratoconjunctivitis sicca, not specified as Sjogren's   . Loss of weight   . Macular degeneration (senile) of retina, unspecified   . Memory loss   . Other and unspecified hyperlipidemia   . Other malaise and fatigue 06/17/2010  . Pain in joint, site unspecified   . Pathologic fracture of vertebrae   . Reflux esophagitis   . Scoliosis (and kyphoscoliosis), idiopathic   . Senile osteoporosis   . Type II or unspecified type diabetes mellitus without mention of complication, uncontrolled   . Unspecified essential hypertension   . Unspecified hereditary and idiopathic peripheral neuropathy   . Unspecified vitamin D deficiency    Past Surgical History:  Procedure Laterality Date  . APPENDECTOMY    . BREAST LUMPECTOMY  02/05/2010   Dr. Christie Beckers  . CATARACT EXTRACTION W/ INTRAOCULAR LENS  IMPLANT, BILATERAL  04/2013   Dr. Bing Plume  . CHOLECYSTECTOMY  2004   Dr. Zella Richer  . COLONOSCOPY  2001   Dr. Collene Mares  . fibroidectomy  1950  . [place]  . KYPHOPLASTY  2010   T10 & T12 Dr. Erline Levine  . MEDIAL PARTIAL KNEE REPLACEMENT Left 2004   Dr. Ronnie Derby  . SPINE SURGERY     vertebroplasty T10, T12  . SPINE SURGERY  11/02/2001  C4-5 & C5-6 Titanium Plate Placed Dr. Carloyn Manner  . THYROIDECTOMY, PARTIAL Left 2007  . TOTAL KNEE ARTHROPLASTY Right 2009   Dr. Alvan Dame     Allergies  Allergen Reactions  . Bactrim     unknown  . Betadine [Povidone Iodine]     unknown  . Metformin And Related     Hurt stomach  . Nutmeg  Oil (Myristica Oil)     unknown  . Sulfa Antibiotics     unknown    Allergies as of 11/14/2020      Reactions   Bactrim    unknown   Betadine [povidone Iodine]    unknown   Metformin And Related    Hurt stomach   Nutmeg Oil (myristica Oil)    unknown   Sulfa Antibiotics    unknown      Medication List       Accurate as of Nov 14, 2020 10:46 AM. If you have any questions, ask your nurse or doctor.        amLODipine 5 MG tablet Commonly known as: NORVASC Take 5 mg by mouth daily.   aspirin 81 MG EC tablet Take 1 tablet (81 mg total) by mouth daily.   betamethasone dipropionate 0.05 % ointment Commonly known as: DIPROLENE Apply topically. Apply BID PRN to active rash on extremities . 1 app immediately after shower. Twice A Day - PRN   diclofenac Sodium 1 % Gel Commonly known as: VOLTAREN Apply 2 g topically 2 (two) times daily as needed (knee pain). Apply to both knees   feeding supplement Liqd Take 237 mLs by mouth 2 (two) times daily between meals.   losartan 50 MG tablet Commonly known as: Cozaar Take 1 tablet (50 mg total) by mouth daily.   metFORMIN 500 MG 24 hr tablet Commonly known as: GLUCOPHAGE-XR Take 250 mg by mouth 2 (two) times daily. Take 1/2 tablet   metoprolol tartrate 25 MG tablet Commonly known as: LOPRESSOR Take 12.5 mg by mouth 2 (two) times daily. Hold Metoprolol for SBP < 110 and DSP <50   multivitamins ther. w/minerals Tabs tablet Take 1 tablet by mouth daily. 400 mcg tablet   nitroGLYCERIN 0.4 MG SL tablet Commonly known as: NITROSTAT Place 0.4 mg under the tongue every 5 (five) minutes as needed for chest pain. PLACE 0.4 MG UNDER TONGUE EVERY 5 MINUTES X3 FOR CHEST PAIN, IF NO RELIEF, CALL MD OR SEND TO ED FOR FURTHER EVALUATION.       Review of Systems  Immunization History  Administered Date(s) Administered  . Influenza Whole 03/30/2012, 04/13/2013, 04/04/2018  . Influenza, High Dose Seasonal PF 04/02/2019  .  Influenza-Unspecified 04/17/2014, 03/29/2015  . Moderna SARS-COV2 Booster Vaccination 05/08/2020  . Moderna Sars-Covid-2 Vaccination 07/02/2019, 07/30/2019  . Pneumococcal Conjugate-13 12/08/2017  . Pneumococcal Polysaccharide-23 06/30/2001  . Td 06/30/2001, 12/08/2017   Pertinent  Health Maintenance Due  Topic Date Due  . FOOT EXAM  12/21/2015  . OPHTHALMOLOGY EXAM  04/02/2016  . INFLUENZA VACCINE  01/28/2021  . HEMOGLOBIN A1C  05/15/2021  . DEXA SCAN  Completed  . PNA vac Low Risk Adult  Completed   Fall Risk  11/24/2017 07/09/2017 11/01/2015 07/26/2015 06/21/2015  Falls in the past year? No No No No No   Functional Status Survey:    Vitals:   11/14/20 1042  BP: (!) 154/70  Pulse: 72  Resp: 20  Temp: 98.1 F (36.7 C)  SpO2: 96%  Weight: 118 lb 6.4 oz (53.7 kg)  Height:  5\' 3"  (1.6 m)   Body mass index is 20.97 kg/m. Physical Exam  Labs reviewed: Recent Labs    11/11/20 2052 11/12/20 0547 11/12/20 1500 11/13/20 0628  NA 133* 133*  --  136  K 4.4 4.1  --  4.0  CL 100 104  --  106  CO2 23 23  --  24  GLUCOSE 260* 174*  --  116*  BUN 35* 28*  --  17  CREATININE 1.30* 0.89  --  0.78  CALCIUM 11.7* 10.4* 10.6* 9.9  MG  --   --   --  1.8  PHOS  --   --   --  3.0   Recent Labs    03/13/20 0000 11/11/20 2052 11/12/20 0547 11/13/20 0628  AST 20 21 18   --   ALT 15 23 20   --   ALKPHOS 79 65 55  --   BILITOT  --  0.8 0.7  --   PROT  --  6.9 5.5*  --   ALBUMIN 4.0 4.2 3.3* 3.0*   Recent Labs    03/13/20 0000 11/11/20 2052 11/12/20 0547 11/13/20 0628  WBC 10.9 11.8* 12.8* 8.7  NEUTROABS 5,216.00 7.0  --   --   HGB 12.5 13.2 11.8* 11.3*  HCT 36 39.1 35.1* 34.2*  MCV  --  91.4 91.9 94.0  PLT 284 322 271 242   Lab Results  Component Value Date   TSH 0.534 11/12/2020   Lab Results  Component Value Date   HGBA1C 7.8 (H) 11/12/2020   Lab Results  Component Value Date   CHOL 192 03/13/2020   HDL 66 03/13/2020   LDLCALC 107 03/13/2020   TRIG 95  03/13/2020   CHOLHDL 2.4 06/27/2018    Significant Diagnostic Results in last 30 days:  DG Chest Port 1 View  Result Date: 11/11/2020 CLINICAL DATA:  Shortness of breath with cough EXAM: PORTABLE CHEST 1 VIEW COMPARISON:  June 26, 2018 FINDINGS: Lungs are clear. Heart size and pulmonary vascularity are normal. No adenopathy. Postoperative change in lower cervical spine. Previous kyphoplasty lower thoracic region. Bones osteoporotic. Surgical clips noted in the superior left mediastinal region, stable. IMPRESSION: No edema or airspace opacity.  Heart size normal. Electronically Signed   By: Lowella Grip III M.D.   On: 11/11/2020 22:40   ECHOCARDIOGRAM COMPLETE  Result Date: 11/13/2020    ECHOCARDIOGRAM REPORT   Patient Name:   [name] Date of Exam: 11/12/2020 Medical Rec #:  [id number]      Height:       63.0 in Accession #:    [id number]     Weight:       118.0 lb Date of Birth:  06-18-1926       BSA:          1.545 m Patient Age:    21 years       BP:           147/56 mmHg Patient Gender: F              HR:           66 bpm. Exam Location:  Inpatient Procedure: 2D Echo, Cardiac Doppler and Color Doppler Indications:     Dyspnea R06.00  History:         Patient has prior history of Echocardiogram examinations, most                  recent 06/28/2018. Risk Factors:Diabetes and Hypertension.  GERD.  Sonographer:     Darlina Sicilian RDCS Referring Phys:  [id number] Charlesetta Ivory GONFA Diagnosing Phys: Dixie Dials MD IMPRESSIONS  1. Left ventricular ejection fraction, by estimation, is 65 to 70%. The left ventricle has normal function. The left ventricle has no regional wall motion abnormalities. Left ventricular diastolic parameters are consistent with Grade I diastolic dysfunction (impaired relaxation).  2. Right ventricular systolic function is normal. The right ventricular size is normal. There is normal pulmonary artery systolic pressure.  3. There is no evidence of pericardial  effusion.  4. The mitral valve is degenerative. Mild mitral valve regurgitation. Mild to moderate mitral stenosis. Moderate mitral annular calcification.  5. Tricuspid valve regurgitation is moderate.  6. The aortic valve is tricuspid. There is mild calcification of the aortic valve. There is mild thickening of the aortic valve. Aortic valve regurgitation is not visualized. Mild aortic valve sclerosis is present, with no evidence of aortic valve stenosis.  7. There is mild (Grade II) atheroma plaque involving the ascending aorta.  8. The inferior vena cava is normal in size with <50% respiratory variability, suggesting right atrial pressure of 8 mmHg. FINDINGS  Left Ventricle: Left ventricular ejection fraction, by estimation, is 65 to 70%. The left ventricle has normal function. The left ventricle has no regional wall motion abnormalities. The left ventricular internal cavity size was normal in size. There is  no left ventricular hypertrophy. Left ventricular diastolic parameters are consistent with Grade I diastolic dysfunction (impaired relaxation). Right Ventricle: The right ventricular size is normal. No increase in right ventricular wall thickness. Right ventricular systolic function is normal. There is normal pulmonary artery systolic pressure. The tricuspid regurgitant velocity is 2.59 m/s, and  with an assumed right atrial pressure of 8 mmHg, the estimated right ventricular systolic pressure is [id number]. Left Atrium: Left atrial size was normal in size. Right Atrium: Right atrial size was normal in size. Pericardium: There is no evidence of pericardial effusion. Mitral Valve: The mitral valve is degenerative in appearance. There is moderate calcification of the mitral valve leaflet(s). Moderate mitral annular calcification. Mild mitral valve regurgitation. Mild to moderate mitral valve stenosis. MV peak gradient, 10.5 mmHg. The mean mitral valve gradient is 3.0 mmHg. Tricuspid Valve: The tricuspid valve is  normal in structure. Tricuspid valve regurgitation is moderate. Aortic Valve: The aortic valve is tricuspid. There is mild calcification of the aortic valve. There is mild thickening of the aortic valve. There is mild aortic valve annular calcification. Aortic valve regurgitation is not visualized. Mild aortic valve sclerosis is present, with no evidence of aortic valve stenosis. Pulmonic Valve: The pulmonic valve was normal in structure. Pulmonic valve regurgitation is mild. Aorta: The aortic root is normal in size and structure. There is mild (Grade II) atheroma plaque involving the ascending aorta. Venous: The inferior vena cava is normal in size with less than 50% respiratory variability, suggesting right atrial pressure of 8 mmHg. IAS/Shunts: No atrial level shunt detected by color flow Doppler.  LEFT VENTRICLE PLAX 2D LVIDd:         3.86 cm     Diastology LVIDs:         2.25 cm     LV e' medial:    5.44 cm/s LV PW:         0.74 cm     LV E/e' medial:  15.7 LV IVS:        0.68 cm     LV e' lateral:   5.77 cm/s  LVOT diam:     1.60 cm     LV E/e' lateral: 14.8 LV SV:         35 LV SV Index:   23 LVOT Area:     2.01 cm  LV Volumes (MOD) LV vol d, MOD A2C: 59.3 ml LV vol d, MOD A4C: 76.2 ml LV vol s, MOD A2C: 18.1 ml LV vol s, MOD A4C: 21.3 ml LV SV MOD A2C:     41.2 ml LV SV MOD A4C:     76.2 ml LV SV MOD BP:      47.9 ml RIGHT VENTRICLE RV S prime:     9.14 cm/s TAPSE (M-mode): 0.9 cm LEFT ATRIUM             Index       RIGHT ATRIUM          Index LA diam:        3.10 cm 2.01 cm/m  RA Area:     9.10 cm LA Vol (A2C):   32.1 ml 20.77 ml/m RA Volume:   16.40 ml 10.61 ml/m LA Vol (A4C):   20.9 ml 13.53 ml/m LA Biplane Vol: 26.6 ml 17.21 ml/m  AORTIC VALVE LVOT Vmax:   86.60 cm/s LVOT Vmean:  52.600 cm/s LVOT VTI:    0.175 m  AORTA Ao Root diam: 2.30 cm MITRAL VALVE                TRICUSPID VALVE MV Area (PHT): 4.60 cm     TR Peak grad:   26.8 mmHg MV Peak grad:  10.5 mmHg    TR Vmax:        259.00 cm/s MV  Mean grad:  3.0 mmHg MV Vmax:       1.62 m/s     SHUNTS MV Vmean:      82.7 cm/s    Systemic VTI:  0.18 m MV Decel Time: 165 msec     Systemic Diam: 1.60 cm MV E velocity: 85.20 cm/s MV A velocity: 141.00 cm/s MV E/A ratio:  0.60 Dixie Dials MD Electronically signed by Dixie Dials MD Signature Date/Time: 11/13/2020/1:25:26 PM    Final     Assessment/Plan There are no diagnoses linked to this encounter.   Family/ staff Communication:   Labs/tests ordered:

## 2020-11-14 NOTE — Assessment & Plan Note (Signed)
Cognitive impairment, at her baseline.

## 2020-11-16 LAB — PTH-RELATED PEPTIDE: PTH-related peptide: <2 pmol/L

## 2020-11-18 LAB — CULTURE, BLOOD (ROUTINE X 2)
Culture: NO GROWTH
Culture: NO GROWTH
Special Requests: ADEQUATE
Special Requests: ADEQUATE

## 2020-11-20 ENCOUNTER — Non-Acute Institutional Stay: Payer: Medicare Other | Admitting: Internal Medicine

## 2020-11-20 ENCOUNTER — Encounter: Payer: Self-pay | Admitting: Internal Medicine

## 2020-11-20 DIAGNOSIS — I10 Essential (primary) hypertension: Secondary | ICD-10-CM

## 2020-11-20 DIAGNOSIS — I5032 Chronic diastolic (congestive) heart failure: Secondary | ICD-10-CM

## 2020-11-20 DIAGNOSIS — N179 Acute kidney failure, unspecified: Secondary | ICD-10-CM | POA: Diagnosis not present

## 2020-11-20 DIAGNOSIS — E1142 Type 2 diabetes mellitus with diabetic polyneuropathy: Secondary | ICD-10-CM | POA: Diagnosis not present

## 2020-11-20 DIAGNOSIS — R4189 Other symptoms and signs involving cognitive functions and awareness: Secondary | ICD-10-CM | POA: Diagnosis not present

## 2020-11-20 DIAGNOSIS — F418 Other specified anxiety disorders: Secondary | ICD-10-CM

## 2020-11-20 NOTE — Progress Notes (Signed)
Location:   [place] Room Number: [place]:  ALF [phone]) Provider:  Veleta Miners MD  Mast, Man X, NP  Patient Care Team: Mast, Man X, NP as PCP - General (Internal Medicine) [place] Mast, Man X, NP as Nurse Practitioner (Nurse Practitioner) Jacelyn Pi, MD as Consulting Physician (Endocrinology) Calvert Cantor, MD as Consulting Physician (Ophthalmology) Vickey Huger, MD as Consulting Physician (Orthopedic Surgery) Suella Broad, MD as Consulting Physician (Physical Medicine and Rehabilitation) Erline Levine, MD as Consulting Physician (Neurosurgery) Juanita Craver, MD as Consulting Physician (Gastroenterology) [place], Peter M, MD as Consulting Physician (Cardiology) Megan Salon, MD as Consulting Physician (Gynecology)  Extended Emergency Contact Information Primary Emergency Contact: Ladene Artist of [place] Phone: [phone] Relation: Niece Secondary Emergency Contact: Edmon Crape Mobile Phone: [phone] Relation: Other Preferred language: Cleophus Molt Interpreter needed? No Guardian: Meryl Dare Mobile Phone: [phone] Relation: Legal Guardian Preferred language: English Interpreter needed? No  Code Status:  DNR Goals of care: Advanced Directive information Advanced Directives 11/14/2020  Does Patient Have a Medical Advance Directive? Yes  Type of Paramedic of [place];Living will;Out of facility DNR (pink MOST or yellow form)  Does patient want to make changes to medical advance directive? No - Patient declined  Copy of [place] in Chart? Yes - validated most recent copy scanned in chart (See row information)  Would patient like information on creating a medical advance directive? -  Pre-existing out of facility DNR order (yellow form or pink MOST form) Yellow form placed in chart (order not valid for inpatient use)     Chief Complaint   Patient presents with  . Acute Visit    Confusion    HPI:  Pt is a 87 y.o. female seen today for an acute visit for Confusion per Nurses and therapy  She was admitted in the Hospital from 5/15-5/17 for Dehydration with Acute ARI  Patient has h/o Hypertension, Diabetes Mellitus Type 2 , and Cognitive impairnement. She also has h/o NSTEMI in 12/19 managed Conservatively with EF of 55% Also has h/o Allergic Dermatitis Hyperparathyroidism has had Work up before    Patient was send to the ED for Weakness and Sleepiness  She was treated with Bronchitis with Augmentin and prednisone before sending her to ED Was dehydrated Calcium Elevated at 11.7 BUN 35 and Creat of 1.30 Was treated with IV hydration and got 1 dose of IV Zometa for her hypercalcemia She was sent back her room She is working with therapy.  Therapy and  facility nurse think  patient seems very confused . When I went to see the patient she was walking with therapy.  She was alert and oriented x3.  Recognized me she knew exactly where she is.  Denied any shortness of breath or cough.  She said she feels fine  Past Medical History:  Diagnosis Date  . Contact dermatitis and other eczema, due to unspecified cause   . Cortical senile cataract   . GERD (gastroesophageal reflux disease) 02/17/2013  . Hyperparathyroidism, unspecified ([place]) 03/26/2010  . Irritable bowel syndrome   . Keratoconjunctivitis sicca, not specified as Sjogren's   . Loss of weight   . Macular degeneration (senile) of retina, unspecified   . Memory loss   . Other and unspecified hyperlipidemia   . Other malaise and fatigue 06/17/2010  . Pain in joint, site unspecified   . Pathologic fracture of vertebrae   . Reflux esophagitis   .  Scoliosis (and kyphoscoliosis), idiopathic   . Senile osteoporosis   . Type II or unspecified type diabetes mellitus without mention of complication, uncontrolled   . Unspecified essential hypertension   . Unspecified  hereditary and idiopathic peripheral neuropathy   . Unspecified vitamin D deficiency    Past Surgical History:  Procedure Laterality Date  . APPENDECTOMY    . BREAST LUMPECTOMY  02/05/2010   Dr. Christie Beckers  . CATARACT EXTRACTION W/ INTRAOCULAR LENS  IMPLANT, BILATERAL  04/2013   Dr. Bing Plume  . CHOLECYSTECTOMY  2004   Dr. Zella Richer  . COLONOSCOPY  2001   Dr. Collene Mares  . fibroidectomy  1950  . [place]  . KYPHOPLASTY  2010   T10 & T12 Dr. Erline Levine  . MEDIAL PARTIAL KNEE REPLACEMENT Left 2004   Dr. Ronnie Derby  . SPINE SURGERY     vertebroplasty T10, T12  . SPINE SURGERY  11/02/2001    C4-5 & C5-6 Titanium Plate Placed Dr. Carloyn Manner  . THYROIDECTOMY, PARTIAL Left 2007  . TOTAL KNEE ARTHROPLASTY Right 2009   Dr. Alvan Dame     Allergies  Allergen Reactions  . Bactrim     unknown  . Betadine [Povidone Iodine]     unknown  . Metformin And Related     Hurt stomach  . Nutmeg Oil (Myristica Oil)     unknown  . Sulfa Antibiotics     unknown    Allergies as of 11/20/2020      Reactions   Bactrim    unknown   Betadine [povidone Iodine]    unknown   Metformin And Related    Hurt stomach   Nutmeg Oil (myristica Oil)    unknown   Sulfa Antibiotics    unknown      Medication List       Accurate as of Nov 20, 2020  2:24 PM. If you have any questions, ask your nurse or doctor.        amLODipine 5 MG tablet Commonly known as: NORVASC Take 5 mg by mouth daily.   aspirin 81 MG EC tablet Take 1 tablet (81 mg total) by mouth daily.   betamethasone dipropionate 0.05 % ointment Commonly known as: DIPROLENE Apply topically. Apply BID PRN to active rash on extremities . 1 app immediately after shower. Twice A Day - PRN   diclofenac Sodium 1 % Gel Commonly known as: VOLTAREN Apply 2 g topically 2 (two) times daily as needed (knee pain). Apply to both knees   feeding supplement Liqd Take 237 mLs by mouth 2 (two) times daily between meals.   losartan 50 MG  tablet Commonly known as: Cozaar Take 1 tablet (50 mg total) by mouth daily.   metFORMIN 500 MG 24 hr tablet Commonly known as: GLUCOPHAGE-XR Take 250 mg by mouth 2 (two) times daily. Take 1/2 tablet   metoprolol tartrate 25 MG tablet Commonly known as: LOPRESSOR Take 12.5 mg by mouth 2 (two) times daily. Hold Metoprolol for SBP < 110 and DSP <50   multivitamins ther. w/minerals Tabs tablet Take 1 tablet by mouth daily. 400 mcg tablet   nitroGLYCERIN 0.4 MG SL tablet Commonly known as: NITROSTAT Place 0.4 mg under the tongue every 5 (five) minutes as needed for chest pain. PLACE 0.4 MG UNDER TONGUE EVERY 5 MINUTES X3 FOR CHEST PAIN, IF NO RELIEF, CALL MD OR SEND TO ED FOR FURTHER EVALUATION.       Review of Systems  Constitutional: Positive for activity change.  HENT: Negative.   Respiratory: Positive for cough.   Cardiovascular: Negative.   Gastrointestinal: Negative.   Genitourinary: Negative.   Musculoskeletal: Positive for gait problem.  Skin: Negative.   Neurological: Positive for weakness.  Psychiatric/Behavioral: Positive for confusion.    Immunization History  Administered Date(s) Administered  . Influenza Whole 03/30/2012, 04/13/2013, 04/04/2018  . Influenza, High Dose Seasonal PF 04/02/2019  . Influenza-Unspecified 04/17/2014, 03/29/2015  . Moderna SARS-COV2 Booster Vaccination 05/08/2020  . Moderna Sars-Covid-2 Vaccination 07/02/2019, 07/30/2019  . Pneumococcal Conjugate-13 12/08/2017  . Pneumococcal Polysaccharide-23 06/30/2001  . Td 06/30/2001, 12/08/2017   Pertinent  Health Maintenance Due  Topic Date Due  . FOOT EXAM  12/21/2015  . OPHTHALMOLOGY EXAM  04/02/2016  . INFLUENZA VACCINE  01/28/2021  . HEMOGLOBIN A1C  05/15/2021  . DEXA SCAN  Completed  . PNA vac Low Risk Adult  Completed   Fall Risk  11/24/2017 07/09/2017 11/01/2015 07/26/2015 06/21/2015  Falls in the past year? No No No No No   Functional Status Survey:    Vitals:   11/20/20 1417   BP: 130/62  Pulse: 72  Resp: 18  Temp: 98.1 F (36.7 C)  SpO2: 98%  Weight: 118 lb 6.4 oz (53.7 kg)  Height: 5\' 3"  (1.6 m)   Body mass index is 20.97 kg/m. Physical Exam  Constitutional: Oriented to person, place, and time. Well-developed and well-nourished.  HENT:  Head: Normocephalic.  Mouth/Throat: Oropharynx is clear and moist.  Eyes: Pupils are equal, round, and reactive to light.  Neck: Neck supple.  Cardiovascular: Normal rate and normal heart sounds.  No murmur heard. Pulmonary/Chest: Effort normal and breath sounds normal. No respiratory distress. No wheezes. She has no rales.  Abdominal: Soft. Bowel sounds are normal. No distension. There is no tenderness. There is no rebound.  Musculoskeletal: No edema.  Lymphadenopathy: none Neurological: Alert and oriented to person, place, and time. No Focal Deficits Skin: Skin is warm and dry.  Psychiatric: Normal mood and affect. Behavior is normal. Thought content normal.    Labs reviewed: Recent Labs    11/11/20 2052 11/12/20 0547 11/12/20 1500 11/13/20 0628  NA 133* 133*  --  136  K 4.4 4.1  --  4.0  CL 100 104  --  106  CO2 23 23  --  24  GLUCOSE 260* 174*  --  116*  BUN 35* 28*  --  17  CREATININE 1.30* 0.89  --  0.78  CALCIUM 11.7* 10.4* 10.6* 9.9  MG  --   --   --  1.8  PHOS  --   --   --  3.0   Recent Labs    11/07/20 0000 11/11/20 2052 11/12/20 0547 11/13/20 0628  AST 23 21 18   --   ALT 13 23 20   --   ALKPHOS 67 65 55  --   BILITOT  --  0.8 0.7  --   PROT  --  6.9 5.5*  --   ALBUMIN 3.9 4.2 3.3* 3.0*   Recent Labs    03/13/20 0000 11/07/20 0000 11/11/20 2052 11/12/20 0547 11/13/20 0628  WBC 10.9 8.9 11.8* 12.8* 8.7  NEUTROABS 5,216.00 5,376.00 7.0  --   --   HGB 12.5 13.1 13.2 11.8* 11.3*  HCT 36 38 39.1 35.1* 34.2*  MCV  --   --  91.4 91.9 94.0  PLT 284 328 322 271 242   Lab Results  Component Value Date   TSH 0.534 11/12/2020   Lab Results  Component  Value Date   HGBA1C 7.8  (H) 11/12/2020   Lab Results  Component Value Date   CHOL 192 03/13/2020   HDL 66 03/13/2020   LDLCALC 107 03/13/2020   TRIG 95 03/13/2020   CHOLHDL 2.4 06/27/2018    Significant Diagnostic Results in last 30 days:  DG Chest Port 1 View  Result Date: 11/11/2020 CLINICAL DATA:  Shortness of breath with cough EXAM: PORTABLE CHEST 1 VIEW COMPARISON:  June 26, 2018 FINDINGS: Lungs are clear. Heart size and pulmonary vascularity are normal. No adenopathy. Postoperative change in lower cervical spine. Previous kyphoplasty lower thoracic region. Bones osteoporotic. Surgical clips noted in the superior left mediastinal region, stable. IMPRESSION: No edema or airspace opacity.  Heart size normal. Electronically Signed   By: Lowella Grip III M.D.   On: 11/11/2020 22:40   ECHOCARDIOGRAM COMPLETE  Result Date: 11/13/2020    ECHOCARDIOGRAM REPORT   Patient Name:   [name] Date of Exam: 11/12/2020 Medical Rec #:  [id number]      Height:       63.0 in Accession #:    [id number]     Weight:       118.0 lb Date of Birth:  12-06-1925       BSA:          1.545 m Patient Age:    87 years       BP:           147/56 mmHg Patient Gender: F              HR:           66 bpm. Exam Location:  Inpatient Procedure: 2D Echo, Cardiac Doppler and Color Doppler Indications:     Dyspnea R06.00  History:         Patient has prior history of Echocardiogram examinations, most                  recent 06/28/2018. Risk Factors:Diabetes and Hypertension.                  GERD.  Sonographer:     Darlina Sicilian RDCS Referring Phys:  [id number] [place] Diagnosing Phys: Dixie Dials MD IMPRESSIONS  1. Left ventricular ejection fraction, by estimation, is 65 to 70%. The left ventricle has normal function. The left ventricle has no regional wall motion abnormalities. Left ventricular diastolic parameters are consistent with Grade I diastolic dysfunction (impaired relaxation).  2. Right ventricular systolic function is normal.  The right ventricular size is normal. There is normal pulmonary artery systolic pressure.  3. There is no evidence of pericardial effusion.  4. The mitral valve is degenerative. Mild mitral valve regurgitation. Mild to moderate mitral stenosis. Moderate mitral annular calcification.  5. Tricuspid valve regurgitation is moderate.  6. The aortic valve is tricuspid. There is mild calcification of the aortic valve. There is mild thickening of the aortic valve. Aortic valve regurgitation is not visualized. Mild aortic valve sclerosis is present, with no evidence of aortic valve stenosis.  7. There is mild (Grade II) atheroma plaque involving the ascending aorta.  8. The inferior vena cava is normal in size with <50% respiratory variability, suggesting right atrial pressure of 8 mmHg. FINDINGS  Left Ventricle: Left ventricular ejection fraction, by estimation, is 65 to 70%. The left ventricle has normal function. The left ventricle has no regional wall motion abnormalities. The left ventricular internal cavity size was normal in size. There is  no  left ventricular hypertrophy. Left ventricular diastolic parameters are consistent with Grade I diastolic dysfunction (impaired relaxation). Right Ventricle: The right ventricular size is normal. No increase in right ventricular wall thickness. Right ventricular systolic function is normal. There is normal pulmonary artery systolic pressure. The tricuspid regurgitant velocity is 2.59 m/s, and  with an assumed right atrial pressure of 8 mmHg, the estimated right ventricular systolic pressure is 35.4 mmHg. Left Atrium: Left atrial size was normal in size. Right Atrium: Right atrial size was normal in size. Pericardium: There is no evidence of pericardial effusion. Mitral Valve: The mitral valve is degenerative in appearance. There is moderate calcification of the mitral valve leaflet(s). Moderate mitral annular calcification. Mild mitral valve regurgitation. Mild to moderate  mitral valve stenosis. MV peak gradient, 10.5 mmHg. The mean mitral valve gradient is 3.0 mmHg. Tricuspid Valve: The tricuspid valve is normal in structure. Tricuspid valve regurgitation is moderate. Aortic Valve: The aortic valve is tricuspid. There is mild calcification of the aortic valve. There is mild thickening of the aortic valve. There is mild aortic valve annular calcification. Aortic valve regurgitation is not visualized. Mild aortic valve sclerosis is present, with no evidence of aortic valve stenosis. Pulmonic Valve: The pulmonic valve was normal in structure. Pulmonic valve regurgitation is mild. Aorta: The aortic root is normal in size and structure. There is mild (Grade II) atheroma plaque involving the ascending aorta. Venous: The inferior vena cava is normal in size with less than 50% respiratory variability, suggesting right atrial pressure of 8 mmHg. IAS/Shunts: No atrial level shunt detected by color flow Doppler.  LEFT VENTRICLE PLAX 2D LVIDd:         3.86 cm     Diastology LVIDs:         2.25 cm     LV e' medial:    5.44 cm/s LV PW:         0.74 cm     LV E/e' medial:  15.7 LV IVS:        0.68 cm     LV e' lateral:   5.77 cm/s LVOT diam:     1.60 cm     LV E/e' lateral: 14.8 LV SV:         35 LV SV Index:   23 LVOT Area:     2.01 cm  LV Volumes (MOD) LV vol d, MOD A2C: 59.3 ml LV vol d, MOD A4C: 76.2 ml LV vol s, MOD A2C: 18.1 ml LV vol s, MOD A4C: 21.3 ml LV SV MOD A2C:     41.2 ml LV SV MOD A4C:     76.2 ml LV SV MOD BP:      47.9 ml RIGHT VENTRICLE RV S prime:     9.14 cm/s TAPSE (M-mode): 0.9 cm LEFT ATRIUM             Index       RIGHT ATRIUM          Index LA diam:        3.10 cm 2.01 cm/m  RA Area:     9.10 cm LA Vol (A2C):   32.1 ml 20.77 ml/m RA Volume:   16.40 ml 10.61 ml/m LA Vol (A4C):   20.9 ml 13.53 ml/m LA Biplane Vol: 26.6 ml 17.21 ml/m  AORTIC VALVE LVOT Vmax:   86.60 cm/s LVOT Vmean:  52.600 cm/s LVOT VTI:    0.175 m  AORTA Ao Root diam: 2.30 cm MITRAL VALVE  TRICUSPID VALVE MV Area (PHT): 4.60 cm     TR Peak grad:   26.8 mmHg MV Peak grad:  10.5 mmHg    TR Vmax:        259.00 cm/s MV Mean grad:  3.0 mmHg MV Vmax:       1.62 m/s     SHUNTS MV Vmean:      82.7 cm/s    Systemic VTI:  0.18 m MV Decel Time: 165 msec     Systemic Diam: 1.60 cm MV E velocity: 85.20 cm/s MV A velocity: 141.00 cm/s MV E/A ratio:  0.60 Dixie Dials MD Electronically signed by Dixie Dials MD Signature Date/Time: 11/13/2020/1:25:26 PM    Final     Assessment/Plan Cognitive impairment She seems at baseline to me Will Continue Speech and physical therapy with her Talked ot the Nurse and therapy Repeat CBC AND CMP Was treated for UTI in hospital Hypercalcemia PTH in Normal Limits PTHR low. Calcitrol in Normal Limits Has had work up before Calcium level better after Hydration and Zometa Continue to monitor Acute renal failure, unspecified acute renal failure type ([place]) Resolved with holding Lasix and Hydration DM type 2 with diabetic peripheral neuropathy (HCC) A1C in good limits for her age No Change for now  Essential hypertension On Cozaar and Lopressor and Norvasc Chronic diastolic congestive heart failure ([place]) Off lasix for now Will continue to monitor Depression with anxiety Seems at baseline for now Was on Remeron before. I had stopped it as she was gaining weight    Family/ staff Communication:   Labs/tests ordered:  CBC,CMP Total time spent in this patient care encounter was  45_  minutes; greater than 50% of the visit spent counseling patient and staff, reviewing records , Labs and coordinating care for problems addressed at this encounter.

## 2020-11-23 ENCOUNTER — Non-Acute Institutional Stay: Payer: Medicare Other | Admitting: Nurse Practitioner

## 2020-11-23 DIAGNOSIS — I10 Essential (primary) hypertension: Secondary | ICD-10-CM | POA: Diagnosis not present

## 2020-11-23 DIAGNOSIS — E1142 Type 2 diabetes mellitus with diabetic polyneuropathy: Secondary | ICD-10-CM | POA: Diagnosis not present

## 2020-11-23 DIAGNOSIS — I5032 Chronic diastolic (congestive) heart failure: Secondary | ICD-10-CM

## 2020-11-23 DIAGNOSIS — F418 Other specified anxiety disorders: Secondary | ICD-10-CM | POA: Diagnosis not present

## 2020-11-23 DIAGNOSIS — R4189 Other symptoms and signs involving cognitive functions and awareness: Secondary | ICD-10-CM

## 2020-11-23 LAB — CBC AND DIFFERENTIAL
HCT: 37 (ref 36–46)
Hemoglobin: 12 (ref 12.0–16.0)
Neutrophils Absolute: 6287
Platelets: 284 (ref 150–399)
WBC: 9.9

## 2020-11-23 LAB — BASIC METABOLIC PANEL
BUN: 14 (ref 4–21)
CO2: 27 — AB (ref 13–22)
Chloride: 105 (ref 99–108)
Creatinine: 0.9 (ref 0.5–1.1)
Glucose: 130
Potassium: 4.4 (ref 3.4–5.3)
Sodium: 138 (ref 137–147)

## 2020-11-23 LAB — COMPREHENSIVE METABOLIC PANEL
Albumin: 3.8 (ref 3.5–5.0)
Calcium: 10.6 (ref 8.7–10.7)
Globulin: 2.5

## 2020-11-23 LAB — HEPATIC FUNCTION PANEL
ALT: 16 (ref 7–35)
AST: 22 (ref 13–35)
Alkaline Phosphatase: 62 (ref 25–125)
Bilirubin, Total: 0.5

## 2020-11-23 LAB — CBC: RBC: 3.99 (ref 3.87–5.11)

## 2020-11-23 NOTE — Assessment & Plan Note (Signed)
chronic, diastolic, EF 07-62%, BNP wnl, off diuretics due to AKI

## 2020-11-23 NOTE — Assessment & Plan Note (Signed)
Cognitive impairment, at her baseline.

## 2020-11-23 NOTE — Assessment & Plan Note (Signed)
Hypercalcemia, Ca 11.7>>9.9(corrected to 10.7, PTH 35, s/p Zometa x1, PTHrP <2.0 11/11/20,  Hyperparathyroidism, workup with Dr. Chalmers Cater, Ca 10.6 11/22/20

## 2020-11-23 NOTE — Assessment & Plan Note (Signed)
T2DM, Hgb a1c 7.8%, takes Metformin

## 2020-11-23 NOTE — Assessment & Plan Note (Addendum)
staff reported the patient's episodes of emotional outburst, the patient stated she tried to stay in her room to avoid people, but stated she sleeps/eats well, smiled and conversed appropriated during my examination. CBC/diff, CMP/eGFR 5/26 unremarkable except Ca 10.6. may consider Sertraline 14m if symptoms persist or worsens.

## 2020-11-23 NOTE — Assessment & Plan Note (Signed)
Blood pressure is controlled, takes Losartan, Metoprolol, Amlodipine. Bun/creat 14/0.85 11/22/20

## 2020-11-23 NOTE — Progress Notes (Signed)
Location:   [facility] Room Number: 604 Place of Service:  ALF (13) Provider: Lennie Odor Joab Carden NP  Kamil Hanigan X, NP  Patient Care Team: Kelie Gainey X, NP as PCP - General (Internal Medicine) [place] Equan Cogbill X, NP as Nurse Practitioner (Nurse Practitioner) Jacelyn Pi, MD as Consulting Physician (Endocrinology) Calvert Cantor, MD as Consulting Physician (Ophthalmology) Vickey Huger, MD as Consulting Physician (Orthopedic Surgery) Suella Broad, MD as Consulting Physician (Physical Medicine and Rehabilitation) Erline Levine, MD as Consulting Physician (Neurosurgery) Juanita Craver, MD as Consulting Physician (Gastroenterology) Martinique, Peter M, MD as Consulting Physician (Cardiology) Megan Salon, MD as Consulting Physician (Gynecology)  Extended Emergency Contact Information Primary Emergency Contact: Ladene Artist of [place] Phone: [phone] Relation: Niece Secondary Emergency Contact: Edmon Crape Mobile Phone: [phone] Relation: Other Preferred language: Cleophus Molt Interpreter needed? No Guardian: Meryl Dare Mobile Phone: [phone] Relation: Legal Guardian Preferred language: English Interpreter needed? No  Code Status: DNR Goals of care: Advanced Directive information Advanced Directives 11/14/2020  Does Patient Have a Medical Advance Directive? Yes  Type of Paramedic of Success;Living will;Out of facility DNR (pink MOST or yellow form)  Does patient want to make changes to medical advance directive? No - Patient declined  Copy of [place] in Chart? Yes - validated most recent copy scanned in chart (See row information)  Would patient like information on creating a medical advance directive? -  Pre-existing out of facility DNR order (yellow form or pink MOST form) Yellow form placed in chart (order not valid for inpatient use)     Chief Complaint  Patient presents  with  . Acute Visit    Emotional outburst episodes.     HPI:  Pt is a 87 y.o. female seen today for an acute visit for staff reported the patient's episodes of emotional outburst, the patient stated she tried to stay in her room to avoid people, but stated she sleeps/eats well, smiled and conversed appropriated during my examination.                Hypercalcemia, Ca 11.7>>9.9(corrected to 10.7, PTH 35, s/p Zometa x1, PTHrP <2.0 11/11/20,  Hyperparathyroidism, workup with Dr. Chalmers Cater, Ca 10.6 11/22/20             T2DM, Hgb a1c 7.8%, takes Metformin             HTN, takes Losartan, Metoprolol, Amlodipine. Bun/creat 14/0.85 11/22/20             CHF, chronic, diastolic, EF 95-28%, BNP wnl, off diuretics due to AKI             Cognitive impairment, at her baseline.      Past Medical History:  Diagnosis Date  . Contact dermatitis and other eczema, due to unspecified cause   . Cortical senile cataract   . GERD (gastroesophageal reflux disease) 02/17/2013  . Hyperparathyroidism, unspecified ([place]) 03/26/2010  . Irritable bowel syndrome   . Keratoconjunctivitis sicca, not specified as Sjogren's   . Loss of weight   . Macular degeneration (senile) of retina, unspecified   . Memory loss   . Other and unspecified hyperlipidemia   . Other malaise and fatigue 06/17/2010  . Pain in joint, site unspecified   . Pathologic fracture of vertebrae   . Reflux esophagitis   . Scoliosis (and kyphoscoliosis), idiopathic   . Senile osteoporosis   . Type II or unspecified type diabetes mellitus without mention of  complication, uncontrolled   . Unspecified essential hypertension   . Unspecified hereditary and idiopathic peripheral neuropathy   . Unspecified vitamin D deficiency    Past Surgical History:  Procedure Laterality Date  . APPENDECTOMY    . BREAST LUMPECTOMY  02/05/2010   Dr. Christie Beckers  . CATARACT EXTRACTION W/ INTRAOCULAR LENS  IMPLANT, BILATERAL  04/2013   Dr. Bing Plume  . CHOLECYSTECTOMY  2004    Dr. Zella Richer  . COLONOSCOPY  2001   Dr. Collene Mares  . fibroidectomy  1950  . [place]  . KYPHOPLASTY  2010   T10 & T12 Dr. Erline Levine  . MEDIAL PARTIAL KNEE REPLACEMENT Left 2004   Dr. Ronnie Derby  . SPINE SURGERY     vertebroplasty T10, T12  . SPINE SURGERY  11/02/2001    C4-5 & C5-6 Titanium Plate Placed Dr. Carloyn Manner  . THYROIDECTOMY, PARTIAL Left 2007  . TOTAL KNEE ARTHROPLASTY Right 2009   Dr. Alvan Dame     Allergies  Allergen Reactions  . Bactrim     unknown  . Betadine [Povidone Iodine]     unknown  . Metformin And Related     Hurt stomach  . Nutmeg Oil (Myristica Oil)     unknown  . Sulfa Antibiotics     unknown    Allergies as of 11/23/2020      Reactions   Bactrim    unknown   Betadine [povidone Iodine]    unknown   Metformin And Related    Hurt stomach   Nutmeg Oil (myristica Oil)    unknown   Sulfa Antibiotics    unknown      Medication List       Accurate as of Nov 23, 2020 11:59 PM. If you have any questions, ask your nurse or doctor.        amLODipine 5 MG tablet Commonly known as: NORVASC Take 5 mg by mouth daily.   aspirin 81 MG EC tablet Take 1 tablet (81 mg total) by mouth daily.   betamethasone dipropionate 0.05 % ointment Commonly known as: DIPROLENE Apply topically. Apply BID PRN to active rash on extremities . 1 app immediately after shower. Twice A Day - PRN   diclofenac Sodium 1 % Gel Commonly known as: VOLTAREN Apply 2 g topically 2 (two) times daily as needed (knee pain). Apply to both knees   feeding supplement Liqd Take 237 mLs by mouth 2 (two) times daily between meals.   losartan 50 MG tablet Commonly known as: Cozaar Take 1 tablet (50 mg total) by mouth daily.   metFORMIN 500 MG 24 hr tablet Commonly known as: GLUCOPHAGE-XR Take 250 mg by mouth 2 (two) times daily. Take 1/2 tablet   metoprolol tartrate 25 MG tablet Commonly known as: LOPRESSOR Take 12.5 mg by mouth 2 (two) times daily. Hold Metoprolol for SBP  < 110 and DSP <50   multivitamins ther. w/minerals Tabs tablet Take 1 tablet by mouth daily. 400 mcg tablet   nitroGLYCERIN 0.4 MG SL tablet Commonly known as: NITROSTAT Place 0.4 mg under the tongue every 5 (five) minutes as needed for chest pain. PLACE 0.4 MG UNDER TONGUE EVERY 5 MINUTES X3 FOR CHEST PAIN, IF NO RELIEF, CALL MD OR SEND TO ED FOR FURTHER EVALUATION.       Review of Systems  Constitutional: Negative for activity change, appetite change and fever.  HENT: Positive for hearing loss. Negative for congestion, trouble swallowing and voice change.   Respiratory: Negative for  cough, shortness of breath and wheezing.   Cardiovascular: Positive for leg swelling. Negative for chest pain and palpitations.  Gastrointestinal: Negative.  Negative for abdominal pain, constipation, nausea and vomiting.  Genitourinary: Negative for dysuria and urgency.  Musculoskeletal: Positive for gait problem.  Skin: Negative for color change.  Neurological: Negative for speech difficulty, weakness, light-headedness and headaches.  Psychiatric/Behavioral: Negative for confusion, dysphoric mood, hallucinations and sleep disturbance. The patient is not nervous/anxious.     Immunization History  Administered Date(s) Administered  . Influenza Whole 03/30/2012, 04/13/2013, 04/04/2018  . Influenza, High Dose Seasonal PF 04/02/2019  . Influenza-Unspecified 04/17/2014, 03/29/2015  . Moderna SARS-COV2 Booster Vaccination 05/08/2020  . Moderna Sars-Covid-2 Vaccination 07/02/2019, 07/30/2019  . Pneumococcal Conjugate-13 12/08/2017  . Pneumococcal Polysaccharide-23 06/30/2001  . Td 06/30/2001, 12/08/2017   Pertinent  Health Maintenance Due  Topic Date Due  . FOOT EXAM  12/21/2015  . OPHTHALMOLOGY EXAM  04/02/2016  . INFLUENZA VACCINE  01/28/2021  . HEMOGLOBIN A1C  05/15/2021  . DEXA SCAN  Completed  . PNA vac Low Risk Adult  Completed   Fall Risk  11/24/2017 07/09/2017 11/01/2015 07/26/2015 06/21/2015   Falls in the past year? _0    Functional Status Survey:    There were no vitals filed for this visit. There is no height or weight on file to calculate BMI. Physical Exam Vitals and nursing note reviewed.  Constitutional:      Appearance: Normal appearance.  HENT:     Head: Normocephalic and atraumatic.     Mouth/Throat:     Mouth: Mucous membranes are moist.  Eyes:     Extraocular Movements: Extraocular movements intact.     Conjunctiva/sclera: Conjunctivae normal.     Pupils: Pupils are equal, round, and reactive to light.  Cardiovascular:     Rate and Rhythm: Normal rate and regular rhythm.     Heart sounds: No murmur heard.   Pulmonary:     Effort: Pulmonary effort is normal.     Breath sounds: No rales.  Abdominal:     General: Bowel sounds are normal.     Palpations: Abdomen is soft.     Tenderness: There is no abdominal tenderness.  Musculoskeletal:     Cervical back: Normal range of motion and neck supple.     Right lower leg: Edema present.     Left lower leg: Edema present.     Comments: Kyphoscoliosis. Trace edema BLE  Skin:    General: Skin is warm and dry.  Neurological:     General: No focal deficit present.     Mental Status: She is alert. Mental status is at baseline.     Motor: No weakness.     Coordination: Coordination normal.     Gait: Gait abnormal.     Comments: Oriented to person, place.   Psychiatric:        Mood and Affect: Mood normal.        Behavior: Behavior normal.        Thought Content: Thought content normal.        Judgment: Judgment normal.     Labs reviewed: Recent Labs    11/11/20 2052 11/12/20 0547 11/12/20 1500 11/13/20 0628  NA 133* 133*  --  136  K 4.4 4.1  --  4.0  CL 100 104  --  106  CO2 23 23  --  24  GLUCOSE 260* 174*  --  116*  BUN 35* 28*  --  17  CREATININE 1.30* 0.89  --  0.78  CALCIUM 11.7* 10.4* 10.6* 9.9  MG  --   --   --  1.8  PHOS  --   --   --  3.0   Recent Labs     11/07/20 0000 11/11/20 2052 11/12/20 0547 11/13/20 0628  AST _0 --   ALT _1 --   ALKPHOS 67 65 55  --   BILITOT  --  0.8 0.7  --   PROT  --  6.9 5.5*  --   ALBUMIN 3.9 4.2 3.3* 3.0*   Recent Labs    03/13/20 0000 11/07/20 0000 11/11/20 2052 11/12/20 0547 11/13/20 0628  WBC 10.9 8.9 11.8* 12.8* 8.7  NEUTROABS 5,216.00 5,376.00 7.0  --   --   HGB 12.5 13.1 13.2 11.8* 11.3*  HCT 36 38 39.1 35.1* 34.2*  MCV  --   --  91.4 91.9 94.0  PLT 284 328 322 271 242   Lab Results  Component Value Date   TSH 0.534 11/12/2020   Lab Results  Component Value Date   HGBA1C 7.8 (H) 11/12/2020   Lab Results  Component Value Date   CHOL 192 03/13/2020   HDL 66 03/13/2020   LDLCALC 107 03/13/2020   TRIG 95 03/13/2020   CHOLHDL 2.4 06/27/2018    Significant Diagnostic Results in last 30 days:  DG Chest Port 1 View  Result Date: 11/11/2020 CLINICAL DATA:  Shortness of breath with cough EXAM: PORTABLE CHEST 1 VIEW COMPARISON:  June 26, 2018 FINDINGS: Lungs are clear. Heart size and pulmonary vascularity are normal. No adenopathy. Postoperative change in lower cervical spine. Previous kyphoplasty lower thoracic region. Bones osteoporotic. Surgical clips noted in the superior left mediastinal region, stable. IMPRESSION: No edema or airspace opacity.  Heart size normal. Electronically Signed   By: Lowella Grip III M.D.   On: 11/11/2020 22:40   ECHOCARDIOGRAM COMPLETE  Result Date: 11/13/2020    ECHOCARDIOGRAM REPORT   Patient Name:   [name] Date of Exam: 11/12/2020 Medical Rec #:  [id number]      Height:       63.0 in Accession #:    [id number]     Weight:       118.0 lb Date of Birth:  1926/03/07       BSA:          1.545 m Patient Age:    87 years       BP:           147/56 mmHg Patient Gender: F              HR:           66 bpm. Exam Location:  Inpatient Procedure: 2D Echo, Cardiac Doppler and Color Doppler Indications:     Dyspnea R06.00  History:         Patient  has prior history of Echocardiogram examinations, most                  recent 06/28/2018. Risk Factors:Diabetes and Hypertension.                  GERD.  Sonographer:     Darlina Sicilian RDCS Referring Phys:  [id number] [place] Diagnosing Phys: Dixie Dials MD IMPRESSIONS  1. Left ventricular ejection fraction, by estimation, is 65 to 70%. The left ventricle has normal function. The left ventricle has no regional wall motion abnormalities. Left  ventricular diastolic parameters are consistent with Grade I diastolic dysfunction (impaired relaxation).  2. Right ventricular systolic function is normal. The right ventricular size is normal. There is normal pulmonary artery systolic pressure.  3. There is no evidence of pericardial effusion.  4. The mitral valve is degenerative. Mild mitral valve regurgitation. Mild to moderate mitral stenosis. Moderate mitral annular calcification.  5. Tricuspid valve regurgitation is moderate.  6. The aortic valve is tricuspid. There is mild calcification of the aortic valve. There is mild thickening of the aortic valve. Aortic valve regurgitation is not visualized. Mild aortic valve sclerosis is present, with no evidence of aortic valve stenosis.  7. There is mild (Grade II) atheroma plaque involving the ascending aorta.  8. The inferior vena cava is normal in size with <50% respiratory variability, suggesting right atrial pressure of 8 mmHg. FINDINGS  Left Ventricle: Left ventricular ejection fraction, by estimation, is 65 to 70%. The left ventricle has normal function. The left ventricle has no regional wall motion abnormalities. The left ventricular internal cavity size was normal in size. There is  no left ventricular hypertrophy. Left ventricular diastolic parameters are consistent with Grade I diastolic dysfunction (impaired relaxation). Right Ventricle: The right ventricular size is normal. No increase in right ventricular wall thickness. Right ventricular systolic function is  normal. There is normal pulmonary artery systolic pressure. The tricuspid regurgitant velocity is 2.59 m/s, and  with an assumed right atrial pressure of 8 mmHg, the estimated right ventricular systolic pressure is 35.3 mmHg. Left Atrium: Left atrial size was normal in size. Right Atrium: Right atrial size was normal in size. Pericardium: There is no evidence of pericardial effusion. Mitral Valve: The mitral valve is degenerative in appearance. There is moderate calcification of the mitral valve leaflet(s). Moderate mitral annular calcification. Mild mitral valve regurgitation. Mild to moderate mitral valve stenosis. MV peak gradient, 10.5 mmHg. The mean mitral valve gradient is 3.0 mmHg. Tricuspid Valve: The tricuspid valve is normal in structure. Tricuspid valve regurgitation is moderate. Aortic Valve: The aortic valve is tricuspid. There is mild calcification of the aortic valve. There is mild thickening of the aortic valve. There is mild aortic valve annular calcification. Aortic valve regurgitation is not visualized. Mild aortic valve sclerosis is present, with no evidence of aortic valve stenosis. Pulmonic Valve: The pulmonic valve was normal in structure. Pulmonic valve regurgitation is mild. Aorta: The aortic root is normal in size and structure. There is mild (Grade II) atheroma plaque involving the ascending aorta. Venous: The inferior vena cava is normal in size with less than 50% respiratory variability, suggesting right atrial pressure of 8 mmHg. IAS/Shunts: No atrial level shunt detected by color flow Doppler.  LEFT VENTRICLE PLAX 2D LVIDd:         3.86 cm     Diastology LVIDs:         2.25 cm     LV e' medial:    5.44 cm/s LV PW:         0.74 cm     LV E/e' medial:  15.7 LV IVS:        0.68 cm     LV e' lateral:   5.77 cm/s LVOT diam:     1.60 cm     LV E/e' lateral: 14.8 LV SV:         35 LV SV Index:   23 LVOT Area:     2.01 cm  LV Volumes (MOD) LV vol d, MOD A2C: 59.3 ml  LV vol d, MOD A4C: 76.2 ml  LV vol s, MOD A2C: 18.1 ml LV vol s, MOD A4C: 21.3 ml LV SV MOD A2C:     41.2 ml LV SV MOD A4C:     76.2 ml LV SV MOD BP:      47.9 ml RIGHT VENTRICLE RV S prime:     9.14 cm/s TAPSE (M-mode): 0.9 cm LEFT ATRIUM             Index       RIGHT ATRIUM          Index LA diam:        3.10 cm 2.01 cm/m  RA Area:     9.10 cm LA Vol (A2C):   32.1 ml 20.77 ml/m RA Volume:   16.40 ml 10.61 ml/m LA Vol (A4C):   20.9 ml 13.53 ml/m LA Biplane Vol: 26.6 ml 17.21 ml/m  AORTIC VALVE LVOT Vmax:   86.60 cm/s LVOT Vmean:  52.600 cm/s LVOT VTI:    0.175 m  AORTA Ao Root diam: 2.30 cm MITRAL VALVE                TRICUSPID VALVE MV Area (PHT): 4.60 cm     TR Peak grad:   26.8 mmHg MV Peak grad:  10.5 mmHg    TR Vmax:        259.00 cm/s MV Mean grad:  3.0 mmHg MV Vmax:       1.62 m/s     SHUNTS MV Vmean:      82.7 cm/s    Systemic VTI:  0.18 m MV Decel Time: 165 msec     Systemic Diam: 1.60 cm MV E velocity: 85.20 cm/s MV A velocity: 141.00 cm/s MV E/A ratio:  0.60 Dixie Dials MD Electronically signed by Dixie Dials MD Signature Date/Time: 11/13/2020/1:25:26 PM    Final     Assessment/Plan: Depression with anxiety staff reported the patient's episodes of emotional outburst, the patient stated she tried to stay in her room to avoid people, but stated she sleeps/eats well, smiled and conversed appropriated during my examination. CBC/diff, CMP/eGFR 5/26 unremarkable except Ca 10.6. may consider Sertraline 13m if symptoms persist or worsens.   Hypercalcemia Hypercalcemia, Ca 11.7>>9.9(corrected to 10.7, PTH 35, s/p Zometa x1, PTHrP <2.0 11/11/20,  Hyperparathyroidism, workup with Dr. BChalmers Cater Ca 10.6 11/22/20   DM type 2 with diabetic peripheral neuropathy (HCC) T2DM, Hgb a1c 7.8%, takes Metformin   Essential hypertension Blood pressure is controlled, takes Losartan, Metoprolol, Amlodipine. Bun/creat 167/5.9156/38/46 Diastolic CHF (HCC)  chronic, diastolic, EF 665-99% BNP wnl, off diuretics due to AKI   Cognitive  impairment Cognitive impairment, at her baseline.      Family/ staff Communication: plan of care reviewed with the patient and charge nurse.   Labs/tests ordered:  none  Time spend 40 minutes.

## 2020-11-27 ENCOUNTER — Encounter: Payer: Self-pay | Admitting: Nurse Practitioner

## 2020-12-24 ENCOUNTER — Encounter: Payer: Self-pay | Admitting: Nurse Practitioner

## 2020-12-24 ENCOUNTER — Non-Acute Institutional Stay: Payer: Medicare Other | Admitting: Nurse Practitioner

## 2020-12-24 DIAGNOSIS — R413 Other amnesia: Secondary | ICD-10-CM

## 2020-12-24 DIAGNOSIS — M159 Polyosteoarthritis, unspecified: Secondary | ICD-10-CM

## 2020-12-24 DIAGNOSIS — I10 Essential (primary) hypertension: Secondary | ICD-10-CM

## 2020-12-24 DIAGNOSIS — I5032 Chronic diastolic (congestive) heart failure: Secondary | ICD-10-CM

## 2020-12-24 DIAGNOSIS — E1142 Type 2 diabetes mellitus with diabetic polyneuropathy: Secondary | ICD-10-CM

## 2020-12-24 NOTE — Assessment & Plan Note (Signed)
Hgb a1c 7.8 11/12/20,  takes Metformin

## 2020-12-24 NOTE — Assessment & Plan Note (Signed)
chronic, diastolic, no s/s of fluid overload, EF 60-65%, BNP wnl, off diuretics

## 2020-12-24 NOTE — Assessment & Plan Note (Signed)
C/o left knee pain if walking too much, didn't needs pain meds.

## 2020-12-24 NOTE — Assessment & Plan Note (Signed)
Blood pressure is controlled, takes Losartan, Metoprolol, Amlodipine. Bun/creat 14/0.85 11/22/20

## 2020-12-24 NOTE — Progress Notes (Signed)
Location:   [facility] Room Number: [phone] Place of Service:  ALF [phone]) Provider:  Jasime Westergren X Edword Cu,NP  Dan Scearce X, NP  Patient Care Team: Calloway Andrus X, NP as PCP - General (Internal Medicine) [place] Shanell Aden X, NP as Nurse Practitioner (Nurse Practitioner) Jacelyn Pi, MD as Consulting Physician (Endocrinology) Calvert Cantor, MD as Consulting Physician (Ophthalmology) Vickey Huger, MD as Consulting Physician (Orthopedic Surgery) Suella Broad, MD as Consulting Physician (Physical Medicine and Rehabilitation) Erline Levine, MD as Consulting Physician (Neurosurgery) Juanita Craver, MD as Consulting Physician (Gastroenterology) Martinique, Peter M, MD as Consulting Physician (Cardiology) Megan Salon, MD as Consulting Physician (Gynecology)  Extended Emergency Contact Information Primary Emergency Contact: Ladene Artist of [employer] Phone: [phone] Relation: Niece Secondary Emergency Contact: Edmon Crape Mobile Phone: [phone] Relation: Other Preferred language: Cleophus Molt Interpreter needed? No Guardian: Meryl Dare Mobile Phone: [phone] Relation: Legal Guardian Preferred language: English Interpreter needed? No  Code Status:  DNR Goals of care: Advanced Directive information Advanced Directives 12/24/2020  Does Patient Have a Medical Advance Directive? Yes  Type of Paramedic of [place];Living will;Out of facility DNR (pink MOST or yellow form)  Does patient want to make changes to medical advance directive? No - Patient declined  Copy of [place] in Chart? Yes - validated most recent copy scanned in chart (See row information)  Would patient like information on creating a medical advance directive? -  Pre-existing out of facility DNR order (yellow form or pink MOST form) Yellow form placed in chart (order not valid for inpatient use)     Chief Complaint   Patient presents with   Medical Management of Chronic Issues    Routine follow up.    Health Maintenance    Discuss need for shingles vaccine, foot exam, and ophthalmology exam.    HPI:  [name] is a 87 y.o. female seen today for medical management of chronic diseases.     Hypercalcemia, Ca 11.7>>9.9(corrected to 10.7, PTH 35, s/p Zometa x1, PTHrP <2.0 11/11/20,  Hyperparathyroidism, workup with Dr. Chalmers Cater, Ca 10.6 11/22/20             T2DM, Hgb a1c 7.8 11/12/20,  takes Metformin             HTN, takes Losartan, Metoprolol, Amlodipine. Bun/creat 14/0.85 11/22/20             CHF, chronic, diastolic, EF 28-36%, BNP wnl, off diuretics             Cognitive impairment, at her baseline.  Past Medical History:  Diagnosis Date   Contact dermatitis and other eczema, due to unspecified cause    Cortical senile cataract    GERD (gastroesophageal reflux disease) 02/17/2013   Hyperparathyroidism, unspecified ([place]) 03/26/2010   Irritable bowel syndrome    Keratoconjunctivitis sicca, not specified as Sjogren's    Loss of weight    Macular degeneration (senile) of retina, unspecified    Memory loss    Other and unspecified hyperlipidemia    Other malaise and fatigue 06/17/2010   Pain in joint, site unspecified    Pathologic fracture of vertebrae    Reflux esophagitis    Scoliosis (and kyphoscoliosis), idiopathic    Senile osteoporosis    Type II or unspecified type diabetes mellitus without mention of complication, uncontrolled    Unspecified essential hypertension    Unspecified hereditary and idiopathic peripheral neuropathy    Unspecified vitamin D deficiency  Past Surgical History:  Procedure Laterality Date   APPENDECTOMY     BREAST LUMPECTOMY  02/05/2010   Dr. Gershon Mussel Cornett   CATARACT EXTRACTION W/ INTRAOCULAR LENS  IMPLANT, BILATERAL  04/2013   Dr. Bing Plume   CHOLECYSTECTOMY  2004   Dr. Zella Richer   COLONOSCOPY  2001   Dr. Collene Mares   fibroidectomy  1950   HEMORRHOID SURGERY  1978    KYPHOPLASTY  2010   T10 & T12 Dr. Erline Levine   MEDIAL PARTIAL KNEE REPLACEMENT Left 2004   Dr. Ronnie [place]   SPINE SURGERY     vertebroplasty T10, T12   SPINE SURGERY  11/02/2001    C4-5 & C5-6 Titanium Plate Placed Dr. Carloyn Manner   THYROIDECTOMY, PARTIAL Left 2007   TOTAL KNEE ARTHROPLASTY Right 2009   Dr. Alvan Dame     Allergies  Allergen Reactions   Bactrim     unknown   Betadine [Povidone Iodine]     unknown   Metformin And Related     Hurt stomach   Nutmeg Oil (Myristica Oil)     unknown   Sulfa Antibiotics     unknown    Allergies as of 12/24/2020       Reactions   Bactrim    unknown   Betadine [povidone Iodine]    unknown   Metformin And Related    Hurt stomach   Nutmeg Oil (myristica Oil)    unknown   Sulfa Antibiotics    unknown        Medication List        Accurate as of December 24, 2020  3:10 PM. If you have any questions, ask your nurse or doctor.          amLODipine 5 MG tablet Commonly known as: NORVASC Take 5 mg by mouth daily.   aspirin 81 MG EC tablet Take 1 tablet (81 mg total) by mouth daily.   betamethasone dipropionate 0.05 % ointment Commonly known as: DIPROLENE Apply topically. Apply BID PRN to active rash on extremities . 1 app immediately after shower. Twice A Day - PRN   diclofenac Sodium 1 % Gel Commonly known as: VOLTAREN Apply 2 g topically 2 (two) times daily as needed (knee pain). Apply to both knees   feeding supplement Liqd Take 237 mLs by mouth 2 (two) times daily between meals.   losartan 50 MG tablet Commonly known as: Cozaar Take 1 tablet (50 mg total) by mouth daily.   metFORMIN 500 MG 24 hr tablet Commonly known as: GLUCOPHAGE-XR Take 250 mg by mouth 2 (two) times daily. Take 1/2 tablet   metoprolol tartrate 25 MG tablet Commonly known as: LOPRESSOR Take 12.5 mg by mouth 2 (two) times daily. Hold Metoprolol for SBP < 110 and DSP <50   multivitamins ther. w/minerals Tabs tablet Take 1 tablet by mouth daily. 400 mcg  tablet   nitroGLYCERIN 0.4 MG SL tablet Commonly known as: NITROSTAT Place 0.4 mg under the tongue every 5 (five) minutes as needed for chest pain. PLACE 0.4 MG UNDER TONGUE EVERY 5 MINUTES X3 FOR CHEST PAIN, IF NO RELIEF, CALL MD OR SEND TO ED FOR FURTHER EVALUATION.        Review of Systems  Constitutional:  Negative for fatigue, fever and unexpected weight change.  HENT:  Positive for hearing loss. Negative for congestion, trouble swallowing and voice change.   Respiratory:  Negative for cough and shortness of breath.   Cardiovascular:  Positive for leg swelling. Negative for chest  pain and palpitations.  Gastrointestinal: Negative.  Negative for abdominal pain and constipation.  Genitourinary:  Negative for dysuria and urgency.  Musculoskeletal:  Positive for arthralgias and gait problem.       Left knee pain when walking too much.   Skin:  Negative for color change.  Neurological:  Negative for speech difficulty, weakness and light-headedness.  Psychiatric/Behavioral:  Negative for confusion and sleep disturbance. The patient is not nervous/anxious.    Immunization History  Administered Date(s) Administered   Influenza Whole 03/30/2012, 04/13/2013, 04/04/2018   Influenza, High Dose Seasonal PF 04/02/2019   Influenza-Unspecified 04/17/2014, 03/29/2015   Moderna SARS-COV2 Booster Vaccination 05/08/2020   Moderna Sars-Covid-2 Vaccination 07/02/2019, 07/30/2019, 11/27/2020   Pneumococcal Conjugate-13 12/08/2017   Pneumococcal Polysaccharide-23 06/30/2001   Td 06/30/2001, 12/08/2017   Pertinent  Health Maintenance Due  Topic Date Due   FOOT EXAM  12/21/2015   OPHTHALMOLOGY EXAM  04/02/2016   INFLUENZA VACCINE  01/28/2021   HEMOGLOBIN A1C  05/15/2021   DEXA SCAN  Completed   PNA vac Low Risk Adult  Completed   Fall Risk  11/24/2017 07/09/2017 11/01/2015 07/26/2015 06/21/2015  Falls in the past year? No No No No No   Functional Status Survey:    Vitals:   12/24/20 1118   BP: 140/66  Pulse: 76  Resp: 18  Temp: 98.1 F (36.7 C)  SpO2: 97%  Weight: 114 lb 9.6 oz (52 kg)  Height: 5\' 3"  (1.6 m)   Body mass index is 20.3 kg/m. Physical Exam Vitals and nursing note reviewed.  Constitutional:      Appearance: Normal appearance.  HENT:     Head: Normocephalic and atraumatic.     Mouth/Throat:     Mouth: Mucous membranes are moist.  Eyes:     Extraocular Movements: Extraocular movements intact.     Conjunctiva/sclera: Conjunctivae normal.     Pupils: Pupils are equal, round, and reactive to light.  Cardiovascular:     Rate and Rhythm: Normal rate and regular rhythm.     Heart sounds: No murmur heard. Pulmonary:     Effort: Pulmonary effort is normal.     Breath sounds: No rales.  Abdominal:     General: Bowel sounds are normal.     Palpations: Abdomen is soft.     Tenderness: There is no abdominal tenderness.  Musculoskeletal:     Cervical back: Normal range of motion and neck supple.     Right lower leg: Edema present.     Left lower leg: Edema present.     Comments: Kyphoscoliosis. Trace edema BLE  Skin:    General: Skin is warm and dry.  Neurological:     General: No focal deficit present.     Mental Status: She is alert. Mental status is at baseline.     Motor: No weakness.     Coordination: Coordination normal.     Gait: Gait abnormal.     Comments: Oriented to person, place.   Psychiatric:        Mood and Affect: Mood normal.        Behavior: Behavior normal.        Thought Content: Thought content normal.        Judgment: Judgment normal.    Labs reviewed: Recent Labs    11/11/20 2052 11/12/20 0547 11/12/20 1500 11/13/20 0628  NA 133* 133*  --  136  K 4.4 4.1  --  4.0  CL 100 104  --  106  CO2  23 23  --  24  GLUCOSE 260* 174*  --  116*  BUN 35* 28*  --  17  CREATININE 1.30* 0.89  --  0.78  CALCIUM 11.7* 10.4* 10.6* 9.9  MG  --   --   --  1.8  PHOS  --   --   --  3.0   Recent Labs    11/07/20 0000 11/11/20 2052  11/12/20 0547 11/13/20 0628  AST 23 21 18   --   ALT 13 23 20   --   ALKPHOS 67 65 55  --   BILITOT  --  0.8 0.7  --   PROT  --  6.9 5.5*  --   ALBUMIN 3.9 4.2 3.3* 3.0*   Recent Labs    03/13/20 0000 11/07/20 0000 11/11/20 2052 11/12/20 0547 11/13/20 0628  WBC 10.9 8.9 11.8* 12.8* 8.7  NEUTROABS 5,216.00 5,376.00 7.0  --   --   HGB 12.5 13.1 13.2 11.8* 11.3*  HCT 36 38 39.1 35.1* 34.2*  MCV  --   --  91.4 91.9 94.0  PLT 284 328 322 271 242   Lab Results  Component Value Date   TSH 0.534 11/12/2020   Lab Results  Component Value Date   HGBA1C 7.8 (H) 11/12/2020   Lab Results  Component Value Date   CHOL 192 03/13/2020   HDL 66 03/13/2020   LDLCALC 107 03/13/2020   TRIG 95 03/13/2020   CHOLHDL 2.4 06/27/2018    Significant Diagnostic Results in last 30 days:  No results found.  Assessment/Plan DM type 2 with diabetic peripheral neuropathy (HCC) Hgb a1c 7.8 11/12/20,  takes Metformin  Essential hypertension Blood pressure is controlled, takes Losartan, Metoprolol, Amlodipine. Bun/creat 32/3.55 7/32/20  Diastolic CHF (HCC) chronic, diastolic, no s/s of fluid overload, EF 60-65%, BNP wnl, off diuretics  Hypercalcemia Ca 11.7>>9.9(corrected to 10.7, PTH 35, s/p Zometa x1, PTHrP <2.0 11/11/20,  Hyperparathyroidism, workup with Dr. Chalmers Cater, Ca 10.6 11/22/20  Memory loss Functioning well in [facility]  Osteoarthritis of multiple joints C/o left knee pain if walking too much, didn't needs pain meds.     Family/ staff Communication: plan of care reviewed with the patient and charge nurse.   Labs/tests ordered:  none  Time spend 40 minutes.

## 2020-12-24 NOTE — Assessment & Plan Note (Signed)
Functioning well in [facility]

## 2020-12-24 NOTE — Assessment & Plan Note (Signed)
Ca 11.7>>9.9(corrected to 10.7, PTH 35, s/p Zometa x1, PTHrP <2.0 11/11/20,  Hyperparathyroidism, workup with Dr. Chalmers Cater, Ca 10.6 11/22/20

## 2021-01-25 ENCOUNTER — Encounter: Payer: Self-pay | Admitting: Orthopedic Surgery

## 2021-01-25 ENCOUNTER — Non-Acute Institutional Stay (SKILLED_NURSING_FACILITY): Payer: Medicare Other | Admitting: Orthopedic Surgery

## 2021-01-25 DIAGNOSIS — M25562 Pain in left knee: Secondary | ICD-10-CM

## 2021-01-25 DIAGNOSIS — Z96652 Presence of left artificial knee joint: Secondary | ICD-10-CM | POA: Diagnosis not present

## 2021-01-25 DIAGNOSIS — I5032 Chronic diastolic (congestive) heart failure: Secondary | ICD-10-CM

## 2021-01-25 DIAGNOSIS — G8929 Other chronic pain: Secondary | ICD-10-CM | POA: Diagnosis not present

## 2021-01-25 NOTE — Progress Notes (Signed)
Location:   [facility] Room Number: [phone] Place of Service:  ALF [phone]) Provider:  Windell Moulding, NP  Mast, Man X, NP  Patient Care Team: Mast, Man X, NP as PCP - General (Internal Medicine) [place] Mast, Man X, NP as Nurse Practitioner (Nurse Practitioner) Jacelyn Pi, MD as Consulting Physician (Endocrinology) Calvert Cantor, MD as Consulting Physician (Ophthalmology) Vickey Huger, MD as Consulting Physician (Orthopedic Surgery) Suella Broad, MD as Consulting Physician (Physical Medicine and Rehabilitation) Erline Levine, MD as Consulting Physician (Neurosurgery) Juanita Craver, MD as Consulting Physician (Gastroenterology) Martinique, Peter M, MD as Consulting Physician (Cardiology) Megan Salon, MD as Consulting Physician (Gynecology)  Extended Emergency Contact Information Primary Emergency Contact: Ladene Artist of [place] Phone: [phone] Relation: Niece Secondary Emergency Contact: Edmon Crape Mobile Phone: [phone] Relation: Other Preferred language: Cleophus Molt Interpreter needed? No Guardian: Meryl Dare Mobile Phone: [phone] Relation: Legal Guardian Preferred language: English Interpreter needed? No  Code Status:  DNR Goals of care: Advanced Directive information Advanced Directives 01/25/2021  Does Patient Have a Medical Advance Directive? Yes  Type of Paramedic of [place];Living will;Out of facility DNR (pink MOST or yellow form)  Does patient want to make changes to medical advance directive? No - Patient declined  Copy of [place] in Chart? Yes - validated most recent copy scanned in chart (See row information)  Would patient like information on creating a medical advance directive? -  Pre-existing out of facility DNR order (yellow form or pink MOST form) Yellow form placed in chart (order not valid for inpatient use)     Chief Complaint   Patient presents with   Acute Visit    Patient complains of ankle edema    HPI:  Pt is a 87 y.o. female seen today for an acute visit for ankle edema and left knee pain.   She complains of worsening left knee pain in the past few weeks. She has a history of partial knee replacement > 20 years ago. Pain worse after ambulating long distances. Pain rated 4/10 today. She takes tylenol 650 mg prn for pain. She will also use voltaren gel 1% bid prn. Recently completed PT for strengthening and gait training. Continues to ambulate wit walker. No recent falls or injuries.   She was seen cardiology yesterday. Advised continuing furosemide 20 mg twice weekly and continuing heart healthy diet. Her furosemide was discontinued during her last hospitalization 05/15 due to dehydration and acute renal failure. She reports some ankle edema if she sits for along time, but it resolves if she elevated her legs or walks. At this time she is requesting not to restart furosemide. She denies chest pain, sob, and dizziness.    Past Medical History:  Diagnosis Date   Contact dermatitis and other eczema, due to unspecified cause    Cortical senile cataract    GERD (gastroesophageal reflux disease) 02/17/2013   Hyperparathyroidism, unspecified ([place]) 03/26/2010   Irritable bowel syndrome    Keratoconjunctivitis sicca, not specified as Sjogren's    Loss of weight    Macular degeneration (senile) of retina, unspecified    Memory loss    Other and unspecified hyperlipidemia    Other malaise and fatigue 06/17/2010   Pain in joint, site unspecified    Pathologic fracture of vertebrae    Reflux esophagitis    Scoliosis (and kyphoscoliosis), idiopathic    Senile osteoporosis    Type II or unspecified type diabetes mellitus  without mention of complication, uncontrolled    Unspecified essential hypertension    Unspecified hereditary and idiopathic peripheral neuropathy    Unspecified vitamin D deficiency    Past  Surgical History:  Procedure Laterality Date   APPENDECTOMY     BREAST LUMPECTOMY  02/05/2010   Dr. Gershon Mussel Cornett   CATARACT EXTRACTION W/ INTRAOCULAR LENS  IMPLANT, BILATERAL  04/2013   Dr. Bing Plume   CHOLECYSTECTOMY  2004   Dr. Zella Richer   COLONOSCOPY  2001   Dr. Collene Mares   fibroidectomy  1950   HEMORRHOID SURGERY  1978   KYPHOPLASTY  2010   T10 & T12 Dr. Erline Levine   MEDIAL PARTIAL KNEE REPLACEMENT Left 2004   Dr. Ronnie [place]   SPINE SURGERY     vertebroplasty T10, T12   SPINE SURGERY  11/02/2001    C4-5 & C5-6 Titanium Plate Placed Dr. Carloyn Manner   THYROIDECTOMY, PARTIAL Left 2007   TOTAL KNEE ARTHROPLASTY Right 2009   Dr. Alvan Dame     Allergies  Allergen Reactions   Bactrim     unknown   Betadine [Povidone Iodine]     unknown   Metformin And Related     Hurt stomach   Nutmeg Oil (Myristica Oil)     unknown   Sulfa Antibiotics     unknown    Allergies as of 01/25/2021       Reactions   Bactrim    unknown   Betadine [povidone Iodine]    unknown   Metformin And Related    Hurt stomach   Nutmeg Oil (myristica Oil)    unknown   Sulfa Antibiotics    unknown        Medication List        Accurate as of January 25, 2021  2:40 PM. If you have any questions, ask your nurse or doctor.          acetaminophen 325 MG tablet Commonly known as: TYLENOL Take 650 mg by mouth every 4 (four) hours as needed.   amLODipine 5 MG tablet Commonly known as: NORVASC Take 5 mg by mouth daily.   aspirin 81 MG EC tablet Take 1 tablet (81 mg total) by mouth daily.   betamethasone dipropionate 0.05 % ointment Commonly known as: DIPROLENE Apply topically. Apply BID PRN to active rash on extremities . 1 app immediately after shower. Twice A Day - PRN   diclofenac Sodium 1 % Gel Commonly known as: VOLTAREN Apply 2 g topically 2 (two) times daily as needed (knee pain). Apply to both knees   losartan 50 MG tablet Commonly known as: Cozaar Take 1 tablet (50 mg total) by mouth daily.    metFORMIN 500 MG 24 hr tablet Commonly known as: GLUCOPHAGE-XR Take 250 mg by mouth 2 (two) times daily. Take 1/2 tablet   metoprolol tartrate 25 MG tablet Commonly known as: LOPRESSOR Take 12.5 mg by mouth 2 (two) times daily. Hold Metoprolol for SBP < 110 and DSP <50   multivitamins ther. w/minerals Tabs tablet Take 1 tablet by mouth daily. 400 mcg tablet   nitroGLYCERIN 0.4 MG SL tablet Commonly known as: NITROSTAT Place 0.4 mg under the tongue every 5 (five) minutes as needed for chest pain. PLACE 0.4 MG UNDER TONGUE EVERY 5 MINUTES X3 FOR CHEST PAIN, IF NO RELIEF, CALL MD OR SEND TO ED FOR FURTHER EVALUATION.        Review of Systems  Constitutional:  Negative for activity change, appetite change, fatigue and fever.  Respiratory:  Negative for cough, shortness of breath and wheezing.   Cardiovascular:  Positive for leg swelling. Negative for chest pain.  Musculoskeletal:  Positive for arthralgias, gait problem and joint swelling.  Neurological:  Positive for weakness. Negative for dizziness and light-headedness.  Psychiatric/Behavioral:  Positive for confusion. Negative for dysphoric mood. The patient is not nervous/anxious.    Immunization History  Administered Date(s) Administered   Influenza Whole 03/30/2012, 04/13/2013, 04/04/2018   Influenza, High Dose Seasonal PF 04/02/2019   Influenza-Unspecified 04/17/2014, 03/29/2015   Moderna SARS-COV2 Booster Vaccination 05/08/2020   Moderna Sars-Covid-2 Vaccination 07/02/2019, 07/30/2019, 11/27/2020   Pneumococcal Conjugate-13 12/08/2017   Pneumococcal Polysaccharide-23 06/30/2001   Td 06/30/2001, 12/08/2017   Pertinent  Health Maintenance Due  Topic Date Due   FOOT EXAM  12/21/2015   OPHTHALMOLOGY EXAM  04/02/2016   INFLUENZA VACCINE  01/28/2021   HEMOGLOBIN A1C  05/15/2021   DEXA SCAN  Completed   PNA vac Low Risk Adult  Completed   Fall Risk  11/24/2017 07/09/2017 11/01/2015 07/26/2015 06/21/2015  Falls in the past  year? No No No No No   Functional Status Survey:    Vitals:   01/25/21 1432  BP: 128/62  Pulse: 76  Resp: 18  Temp: (!) 97.2 F (36.2 C)  SpO2: 94%  Weight: 114 lb 9.6 oz (52 kg)  Height: '5\' 3"'$  (1.6 m)   Body mass index is 20.3 kg/m. Physical Exam Vitals reviewed.  Constitutional:      General: She is not in acute distress. Neck:     Vascular: No carotid bruit.  Cardiovascular:     Rate and Rhythm: Normal rate and regular rhythm.     Pulses: Normal pulses.     Heart sounds: Normal heart sounds.  Pulmonary:     Effort: Pulmonary effort is normal. No respiratory distress.     Breath sounds: Normal breath sounds. No wheezing.  Abdominal:     General: Bowel sounds are normal. There is no distension.     Palpations: Abdomen is soft.     Tenderness: There is no abdominal tenderness.  Musculoskeletal:     Cervical back: Normal range of motion.     Left knee: Swelling present. No erythema. Normal range of motion.     Right lower leg: No edema.     Left lower leg: No edema.     Comments: Minor swelling and tenderness over MCL. No crepitus.   Lymphadenopathy:     Cervical: No cervical adenopathy.  Skin:    General: Skin is warm and dry.     Capillary Refill: Capillary refill takes less than 2 seconds.  Neurological:     General: No focal deficit present.     Mental Status: She is alert. Mental status is at baseline.     Motor: Weakness present.     Gait: Gait abnormal.     Comments: walker  Psychiatric:        Mood and Affect: Mood normal.        Behavior: Behavior normal.    Labs reviewed: Recent Labs    11/11/20 2052 11/12/20 0547 11/12/20 1500 11/13/20 0628  NA 133* 133*  --  136  K 4.4 4.1  --  4.0  CL 100 104  --  106  CO2 23 23  --  24  GLUCOSE 260* 174*  --  116*  BUN 35* 28*  --  17  CREATININE 1.30* 0.89  --  0.78  CALCIUM 11.7* 10.4* 10.6* 9.9  MG  --   --   --  1.8  PHOS  --   --   --  3.0   Recent Labs    11/07/20 0000 11/11/20 2052  11/12/20 0547 11/13/20 0628  AST '23 21 18  '$ --   ALT '13 23 20  '$ --   ALKPHOS 67 65 55  --   BILITOT  --  0.8 0.7  --   PROT  --  6.9 5.5*  --   ALBUMIN 3.9 4.2 3.3* 3.0*   Recent Labs    03/13/20 0000 11/07/20 0000 11/11/20 2052 11/12/20 0547 11/13/20 0628  WBC 10.9 8.9 11.8* 12.8* 8.7  NEUTROABS 5,216.00 5,376.00 7.0  --   --   HGB 12.5 13.1 13.2 11.8* 11.3*  HCT 36 38 39.1 35.1* 34.2*  MCV  --   --  91.4 91.9 94.0  PLT 284 328 322 271 242   Lab Results  Component Value Date   TSH 0.534 11/12/2020   Lab Results  Component Value Date   HGBA1C 7.8 (H) 11/12/2020   Lab Results  Component Value Date   CHOL 192 03/13/2020   HDL 66 03/13/2020   LDLCALC 107 03/13/2020   TRIG 95 03/13/2020   CHOLHDL 2.4 06/27/2018    Significant Diagnostic Results in last 30 days:  No results found.  Assessment/Plan 1. Chronic knee pain after total replacement of left knee joint - s/p partial knee replacement > 20 years - mild swelling/tenderness over MCL, no crepitus - pain with prolonged ambulation - increase tylenol 1000 mg po bid prn for pain - increase voltaren gel 1 %- apply to left knee tid prn for pain - may ice knee bid for pain/comfort  2. Chronic diastolic congestive heart failure ([place]) - followed by cardiology- seen 07/28- recommend 3 month f/u and schedule echo - NSTEMI 12/19, EF 55% - no recent weight fluctuations, sob, some ankle edema reported - furosemide d/c 11/11/2020 due to acute renal failure - patient and family wish to remain off furosemide at this time    Family/ staff Communication: plan discussed with patient and nurse  Labs/tests ordered:  none

## 2021-02-04 ENCOUNTER — Encounter: Payer: Self-pay | Admitting: Nurse Practitioner

## 2021-02-04 ENCOUNTER — Non-Acute Institutional Stay: Payer: Medicare Other | Admitting: Nurse Practitioner

## 2021-02-04 DIAGNOSIS — R413 Other amnesia: Secondary | ICD-10-CM

## 2021-02-04 DIAGNOSIS — I5032 Chronic diastolic (congestive) heart failure: Secondary | ICD-10-CM

## 2021-02-04 DIAGNOSIS — I10 Essential (primary) hypertension: Secondary | ICD-10-CM | POA: Diagnosis not present

## 2021-02-04 DIAGNOSIS — R152 Fecal urgency: Secondary | ICD-10-CM

## 2021-02-04 DIAGNOSIS — R159 Full incontinence of feces: Secondary | ICD-10-CM | POA: Diagnosis not present

## 2021-02-04 DIAGNOSIS — E1142 Type 2 diabetes mellitus with diabetic polyneuropathy: Secondary | ICD-10-CM

## 2021-02-04 NOTE — Assessment & Plan Note (Signed)
CHF, chronic, increased edema BLE, diastolic, EF 123456, BNP wnl, will resume Furosemide '20mg'$  MWF, update BMP

## 2021-02-04 NOTE — Assessment & Plan Note (Signed)
Hgb a1c 7.8 11/12/20,  takes Metformin

## 2021-02-04 NOTE — Progress Notes (Signed)
Location:   [place] Room Number: [phone] Place of Service:  ALF [phone]) Provider:  Mearl Harewood X Ladiamond Gallina, NP  Alfred Eckley X, NP  Patient Care Team: Larenzo Caples X, NP as PCP - General (Internal Medicine) [place] Deserai Cansler X, NP as Nurse Practitioner (Nurse Practitioner) Jacelyn Pi, MD as Consulting Physician (Endocrinology) Calvert Cantor, MD as Consulting Physician (Ophthalmology) Vickey Huger, MD as Consulting Physician (Orthopedic Surgery) Suella Broad, MD as Consulting Physician (Physical Medicine and Rehabilitation) Erline Levine, MD as Consulting Physician (Neurosurgery) Juanita Craver, MD as Consulting Physician (Gastroenterology) Martinique, Peter M, MD as Consulting Physician (Cardiology) Megan Salon, MD as Consulting Physician (Gynecology)  Extended Emergency Contact Information Primary Emergency Contact: Ladene Artist of [place] Phone: [phone] Relation: Niece Secondary Emergency Contact: Edmon Crape Mobile Phone: [phone] Relation: Other Preferred language: Cleophus Molt Interpreter needed? No Guardian: Meryl Dare Mobile Phone: [phone] Relation: Legal Guardian Preferred language: English Interpreter needed? No  Code Status:  DNR Goals of care: Advanced Directive information Advanced Directives 02/04/2021  Does Patient Have a Medical Advance Directive? Yes  Type of Paramedic of [place];Living will;Out of facility DNR (pink MOST or yellow form)  Does patient want to make changes to medical advance directive? No - Patient declined  Copy of [place] in Chart? Yes - validated most recent copy scanned in chart (See row information)  Would patient like information on creating a medical advance directive? -  Pre-existing out of facility DNR order (yellow form or pink MOST form) Yellow form placed in chart (order not valid for inpatient use)     Chief Complaint   Patient presents with   Acute Visit    Patient presents for bowel incontinence.    HPI:  Pt is a 87 y.o. female seen today for an acute visit for c/o urge bowel incontinence for a few days, soft stools, denied abd pain, nausea, vomiting, dysuria or urinary incontinence, afebrile.   Hypercalcemia, Ca 11.7>>9.9(corrected to 10.7, PTH 35, s/p Zometa x1, PTHrP <2.0 11/11/20,  Hyperparathyroidism, workup with Dr. Chalmers Cater, Ca 10.6 11/22/20             T2DM, Hgb a1c 7.8 11/12/20,  takes Metformin             HTN, takes Losartan, Metoprolol, Amlodipine. Bun/creat 14/0.85 11/22/20             CHF, chronic, increased edema BLE, diastolic, EF 123456, BNP wnl, off diuretics             Cognitive impairment, at her baseline.   Past Medical History:  Diagnosis Date   Contact dermatitis and other eczema, due to unspecified cause    Cortical senile cataract    GERD (gastroesophageal reflux disease) 02/17/2013   Hyperparathyroidism, unspecified ([place]) 03/26/2010   Irritable bowel syndrome    Keratoconjunctivitis sicca, not specified as Sjogren's    Loss of weight    Macular degeneration (senile) of retina, unspecified    Memory loss    Other and unspecified hyperlipidemia    Other malaise and fatigue 06/17/2010   Pain in joint, site unspecified    Pathologic fracture of vertebrae    Reflux esophagitis    Scoliosis (and kyphoscoliosis), idiopathic    Senile osteoporosis    Type II or unspecified type diabetes mellitus without mention of complication, uncontrolled    Unspecified essential hypertension    Unspecified hereditary and idiopathic peripheral neuropathy    Unspecified  vitamin D deficiency    Past Surgical History:  Procedure Laterality Date   APPENDECTOMY     BREAST LUMPECTOMY  02/05/2010   Dr. Gershon Mussel Cornett   CATARACT EXTRACTION W/ INTRAOCULAR LENS  IMPLANT, BILATERAL  04/2013   Dr. Bing Plume   CHOLECYSTECTOMY  2004   Dr. Zella Richer   COLONOSCOPY  2001   Dr. Collene Mares   fibroidectomy  1950    HEMORRHOID SURGERY  1978   KYPHOPLASTY  2010   T10 & T12 Dr. Erline Levine   MEDIAL PARTIAL KNEE REPLACEMENT Left 2004   Dr. Ronnie Derby   SPINE SURGERY     vertebroplasty T10, T12   SPINE SURGERY  11/02/2001    C4-5 & C5-6 Titanium Plate Placed Dr. Carloyn Manner   THYROIDECTOMY, PARTIAL Left 2007   TOTAL KNEE ARTHROPLASTY Right 2009   Dr. Alvan Dame     Allergies  Allergen Reactions   Bactrim     unknown   Betadine [Povidone Iodine]     unknown   Metformin And Related     Hurt stomach   Nutmeg Oil (Myristica Oil)     unknown   Sulfa Antibiotics     unknown    Allergies as of 02/04/2021       Reactions   Bactrim    unknown   Betadine [povidone Iodine]    unknown   Metformin And Related    Hurt stomach   Nutmeg Oil (myristica Oil)    unknown   Sulfa Antibiotics    unknown        Medication List        Accurate as of February 04, 2021 11:59 PM. If you have any questions, ask your nurse or doctor.          acetaminophen 325 MG tablet Commonly known as: TYLENOL Take 650 mg by mouth every 4 (four) hours as needed.   amLODipine 5 MG tablet Commonly known as: NORVASC Take 5 mg by mouth daily.   aspirin 81 MG EC tablet Take 1 tablet (81 mg total) by mouth daily.   betamethasone dipropionate 0.05 % ointment Commonly known as: DIPROLENE Apply topically. Apply BID PRN to active rash on extremities . 1 app immediately after shower. Twice A Day - PRN   diclofenac Sodium 1 % Gel Commonly known as: VOLTAREN Apply 2 g topically 2 (two) times daily as needed (knee pain). Apply to both knees   losartan 50 MG tablet Commonly known as: Cozaar Take 1 tablet (50 mg total) by mouth daily.   metFORMIN 500 MG 24 hr tablet Commonly known as: GLUCOPHAGE-XR Take 250 mg by mouth 2 (two) times daily. Take 1/2 tablet   metoprolol tartrate 25 MG tablet Commonly known as: LOPRESSOR Take 12.5 mg by mouth 2 (two) times daily. Hold Metoprolol for SBP < 110 and DSP <50   multivitamins ther.  w/minerals Tabs tablet Take 1 tablet by mouth daily. 400 mcg tablet   nitroGLYCERIN 0.4 MG SL tablet Commonly known as: NITROSTAT Place 0.4 mg under the tongue every 5 (five) minutes as needed for chest pain. PLACE 0.4 MG UNDER TONGUE EVERY 5 MINUTES X3 FOR CHEST PAIN, IF NO RELIEF, CALL MD OR SEND TO ED FOR FURTHER EVALUATION.        Review of Systems  Constitutional:  Negative for activity change, appetite change and fever.  HENT:  Positive for hearing loss. Negative for congestion and trouble swallowing.   Respiratory:  Negative for cough and shortness of breath.   Cardiovascular:  Positive for leg swelling. Negative for chest pain and palpitations.  Gastrointestinal: Negative.  Negative for abdominal distention, abdominal pain, constipation, diarrhea, nausea, rectal pain and vomiting.       Urge incontinent of bowel.   Genitourinary:  Negative for dysuria and urgency.  Musculoskeletal:  Positive for arthralgias and gait problem.       Left knee pain when walking too much.   Skin:  Negative for color change.  Neurological:  Negative for speech difficulty, weakness and light-headedness.  Psychiatric/Behavioral:  Negative for confusion and sleep disturbance. The patient is not nervous/anxious.    Immunization History  Administered Date(s) Administered   Influenza Whole 03/30/2012, 04/13/2013, 04/04/2018   Influenza, High Dose Seasonal PF 04/02/2019   Influenza-Unspecified 04/17/2014, 03/29/2015   Moderna SARS-COV2 Booster Vaccination 05/08/2020   Moderna Sars-Covid-2 Vaccination 07/02/2019, 07/30/2019, 11/27/2020   Pneumococcal Conjugate-13 12/08/2017   Pneumococcal Polysaccharide-23 06/30/2001   Td 06/30/2001, 12/08/2017   Pertinent  Health Maintenance Due  Topic Date Due   FOOT EXAM  12/21/2015   OPHTHALMOLOGY EXAM  04/02/2016   INFLUENZA VACCINE  01/28/2021   HEMOGLOBIN A1C  05/15/2021   DEXA SCAN  Completed   PNA vac Low Risk Adult  Completed   Fall Risk  11/24/2017  07/09/2017 11/01/2015 07/26/2015 06/21/2015  Falls in the past year? No No No No No   Functional Status Survey:    Vitals:   02/04/21 1404  BP: (!) 150/80  Pulse: 69  Resp: 19  Temp: 97.7 F (36.5 C)  SpO2: 98%  Weight: 117 lb (53.1 kg)  Height: '5\' 3"'$  (1.6 m)   Body mass index is 20.73 kg/m. Physical Exam Vitals and nursing note reviewed.  Constitutional:      Appearance: Normal appearance.  HENT:     Head: Normocephalic and atraumatic.     Mouth/Throat:     Mouth: Mucous membranes are moist.  Eyes:     Extraocular Movements: Extraocular movements intact.     Conjunctiva/sclera: Conjunctivae normal.     Pupils: Pupils are equal, round, and reactive to light.  Cardiovascular:     Rate and Rhythm: Normal rate and regular rhythm.     Heart sounds: No murmur heard. Pulmonary:     Effort: Pulmonary effort is normal.     Breath sounds: No rales.  Abdominal:     General: Bowel sounds are normal. There is no distension.     Palpations: Abdomen is soft.     Tenderness: There is no abdominal tenderness. There is no right CVA tenderness, left CVA tenderness, guarding or rebound.  Genitourinary:    Comments: External hemorrhoids, no injury or bleeding.  Musculoskeletal:     Cervical back: Normal range of motion and neck supple.     Right lower leg: Edema present.     Left lower leg: Edema present.     Comments: Kyphoscoliosis. 1+ edema BLE  Skin:    General: Skin is warm and dry.  Neurological:     General: No focal deficit present.     Mental Status: She is alert. Mental status is at baseline.     Motor: No weakness.     Coordination: Coordination normal.     Gait: Gait abnormal.     Comments: Oriented to person, place.   Psychiatric:        Mood and Affect: Mood normal.        Behavior: Behavior normal.        Thought Content: Thought content normal.  Judgment: Judgment normal.    Labs reviewed: Recent Labs    11/11/20 2052 11/12/20 0547 11/12/20 1500  11/13/20 0628  NA 133* 133*  --  136  K 4.4 4.1  --  4.0  CL 100 104  --  106  CO2 23 23  --  24  GLUCOSE 260* 174*  --  116*  BUN 35* 28*  --  17  CREATININE 1.30* 0.89  --  0.78  CALCIUM 11.7* 10.4* 10.6* 9.9  MG  --   --   --  1.8  PHOS  --   --   --  3.0   Recent Labs    11/07/20 0000 11/11/20 2052 11/12/20 0547 11/13/20 0628  AST '23 21 18  '$ --   ALT '13 23 20  '$ --   ALKPHOS 67 65 55  --   BILITOT  --  0.8 0.7  --   PROT  --  6.9 5.5*  --   ALBUMIN 3.9 4.2 3.3* 3.0*   Recent Labs    03/13/20 0000 11/07/20 0000 11/11/20 2052 11/12/20 0547 11/13/20 0628  WBC 10.9 8.9 11.8* 12.8* 8.7  NEUTROABS 5,216.00 5,376.00 7.0  --   --   HGB 12.5 13.1 13.2 11.8* 11.3*  HCT 36 38 39.1 35.1* 34.2*  MCV  --   --  91.4 91.9 94.0  PLT 284 328 322 271 242   Lab Results  Component Value Date   TSH 0.534 11/12/2020   Lab Results  Component Value Date   HGBA1C 7.8 (H) 11/12/2020   Lab Results  Component Value Date   CHOL 192 03/13/2020   HDL 66 03/13/2020   LDLCALC 107 03/13/2020   TRIG 95 03/13/2020   CHOLHDL 2.4 06/27/2018    Significant Diagnostic Results in last 30 days:  No results found.  Assessment/Plan Incontinence of bowel c/o urge bowel incontinence for a few days, soft stools, denied abd pain, nausea, vomiting, dysuria or urinary incontinence, afebrile. Will administer Metformin after meals, may try diet fiber, binding agents, or GI consult. Update CBC/diff one week.   Hypercalcemia Hypercalcemia, Ca 11.7>>9.9(corrected to 10.7, PTH 35, s/p Zometa x1, PTHrP <2.0 11/11/20,  Hyperparathyroidism, workup with Dr. Chalmers Cater, Ca 10.6 11/22/20  DM type 2 with diabetic peripheral neuropathy ([place])  Hgb a1c 7.8 11/12/20,  takes Metformin  Essential hypertension takes Losartan, Metoprolol, Amlodipine. Bun/creat [id number]  Diastolic CHF (HCC) CHF, chronic, increased edema BLE, diastolic, EF 123456, BNP wnl, will resume Furosemide '20mg'$  MWF, update BMP  Memory  loss Cognitive impairment, at her baseline.     Family/ staff Communication: plan of care reviewed with the patient and charge nurse.   Labs/tests ordered:  CBC/diff, BMP one week  Time spend 40 minutes.

## 2021-02-04 NOTE — Assessment & Plan Note (Signed)
Hypercalcemia, Ca 11.7>>9.9(corrected to 10.7, PTH 35, s/p Zometa x1, PTHrP <2.0 11/11/20,  Hyperparathyroidism, workup with Dr. Chalmers Cater, Ca 10.6 11/22/20

## 2021-02-04 NOTE — Assessment & Plan Note (Signed)
Cognitive impairment, at her baseline.

## 2021-02-04 NOTE — Assessment & Plan Note (Signed)
c/o urge bowel incontinence for a few days, soft stools, denied abd pain, nausea, vomiting, dysuria or urinary incontinence, afebrile. Will administer Metformin after meals, may try diet fiber, binding agents, or GI consult. Update CBC/diff one week.

## 2021-02-04 NOTE — Assessment & Plan Note (Signed)
takes Losartan, Metoprolol, Amlodipine. Bun/creat 14/0.85 11/22/20

## 2021-02-05 ENCOUNTER — Encounter: Payer: Self-pay | Admitting: Nurse Practitioner

## 2021-02-12 LAB — COMPREHENSIVE METABOLIC PANEL: Calcium: 10.5 (ref 8.7–10.7)

## 2021-02-12 LAB — CBC AND DIFFERENTIAL
HCT: 37 (ref 36–46)
Hemoglobin: 12.2 (ref 12.0–16.0)
Neutrophils Absolute: 5290
Platelets: 293 (ref 150–399)
WBC: 10

## 2021-02-12 LAB — BASIC METABOLIC PANEL
BUN: 18 (ref 4–21)
CO2: 29 — AB (ref 13–22)
Chloride: 101 (ref 99–108)
Creatinine: 0.9 (ref 0.5–1.1)
Glucose: 141
Potassium: 4.3 (ref 3.4–5.3)
Sodium: 137 (ref 137–147)

## 2021-02-12 LAB — CBC: RBC: 4.14 (ref 3.87–5.11)

## 2021-05-28 ENCOUNTER — Encounter: Payer: Self-pay | Admitting: Internal Medicine

## 2021-05-28 ENCOUNTER — Non-Acute Institutional Stay: Payer: Medicare Other | Admitting: Internal Medicine

## 2021-05-28 DIAGNOSIS — I10 Essential (primary) hypertension: Secondary | ICD-10-CM | POA: Diagnosis not present

## 2021-05-28 DIAGNOSIS — R4189 Other symptoms and signs involving cognitive functions and awareness: Secondary | ICD-10-CM

## 2021-05-28 DIAGNOSIS — E1142 Type 2 diabetes mellitus with diabetic polyneuropathy: Secondary | ICD-10-CM | POA: Diagnosis not present

## 2021-05-28 DIAGNOSIS — E785 Hyperlipidemia, unspecified: Secondary | ICD-10-CM

## 2021-05-28 DIAGNOSIS — I5032 Chronic diastolic (congestive) heart failure: Secondary | ICD-10-CM

## 2021-05-28 LAB — MAGNESIUM: Magnesium: 2

## 2021-05-28 NOTE — Progress Notes (Signed)
Location:   [facility] Room Number: [place]:  ALF [phone]) Provider:  Veleta Miners MD  Mast, Man X, NP  Patient Care Team: Mast, Man X, NP as PCP - General (Internal Medicine) [place] Mast, Man X, NP as Nurse Practitioner (Nurse Practitioner) Jacelyn Pi, MD as Consulting Physician (Endocrinology) Calvert Cantor, MD as Consulting Physician (Ophthalmology) Vickey Huger, MD as Consulting Physician (Orthopedic Surgery) Suella Broad, MD as Consulting Physician (Physical Medicine and Rehabilitation) Erline Levine, MD as Consulting Physician (Neurosurgery) Juanita Craver, MD as Consulting Physician (Gastroenterology) Martinique, Peter M, MD as Consulting Physician (Cardiology) Megan Salon, MD as Consulting Physician (Gynecology)  Extended Emergency Contact Information Primary Emergency Contact: Ladene Artist of [place] Phone: [phone] Relation: Niece Secondary Emergency Contact: Edmon Crape Mobile Phone: [phone] Relation: Other Preferred language: Cleophus Molt Interpreter needed? No Guardian: Meryl Dare Mobile Phone: [phone] Relation: Legal Guardian Preferred language: English Interpreter needed? No  Code Status:  DNR Goals of care: Advanced Directive information Advanced Directives 02/04/2021  Does Patient Have a Medical Advance Directive? Yes  Type of Paramedic of [place];Living will;Out of facility DNR (pink MOST or yellow form)  Does patient want to make changes to medical advance directive? No - Patient declined  Copy of [place] in Chart? Yes - validated most recent copy scanned in chart (See row information)  Would patient like information on creating a medical advance directive? -  Pre-existing out of facility DNR order (yellow form or pink MOST form) Yellow form placed in chart (order not valid for inpatient use)     Chief Complaint   Patient presents with   Medical Management of Chronic Issues   Quality Metric Gaps    Zoster Vaccines- Shingrix (1 of 2)   FOOT EXAM (Yearly) OPHTHALMOLOGY EXAM (Yearly) URINE MICROALBUMIN (Yearly) HEMOGLOBIN A1C (Every 6 Months)      HPI:  Pt is a 87 y.o. female seen today for medical management of chronic diseases.     Patient has h/o Hypertension, Diabetes Mellitus Type 2 , and Cognitive impairnement. She also has h/o NSTEMI in 12/19 managed Conservatively with EF of 55% Also has h/o Allergic Dermatitis Hyperparathyroidism has had Work up before   Lives in [place]  Did nto have any acute complains today She is stable. No new Nursing issues. No Behavior issues Her weight is stable Walks with her walker No Falls Wt Readings from Last 3 Encounters:  05/29/21 117 lb 9.6 oz (53.3 kg)  05/28/21 117 lb 9.6 oz (53.3 kg)  02/04/21 117 lb (53.1 kg)    Past Medical History:  Diagnosis Date   Contact dermatitis and other eczema, due to unspecified cause    Cortical senile cataract    GERD (gastroesophageal reflux disease) 02/17/2013   Hyperparathyroidism, unspecified ([place]) 03/26/2010   Irritable bowel syndrome    Keratoconjunctivitis sicca, not specified as Sjogren's    Loss of weight    Macular degeneration (senile) of retina, unspecified    Memory loss    Other and unspecified hyperlipidemia    Other malaise and fatigue 06/17/2010   Pain in joint, site unspecified    Pathologic fracture of vertebrae    Reflux esophagitis    Scoliosis (and kyphoscoliosis), idiopathic    Senile osteoporosis    Type II or unspecified type diabetes mellitus without mention of complication, uncontrolled    Unspecified essential hypertension    Unspecified hereditary and idiopathic peripheral neuropathy  Unspecified vitamin D deficiency    Past Surgical History:  Procedure Laterality Date   APPENDECTOMY     BREAST LUMPECTOMY  02/05/2010   Dr. Gershon Mussel Cornett   CATARACT EXTRACTION W/ INTRAOCULAR  LENS  IMPLANT, BILATERAL  04/2013   Dr. Bing Plume   CHOLECYSTECTOMY  2004   Dr. Zella Richer   COLONOSCOPY  2001   Dr. Collene Mares   fibroidectomy  1950   HEMORRHOID SURGERY  1978   KYPHOPLASTY  2010   T10 & T12 Dr. Erline Levine   MEDIAL PARTIAL KNEE REPLACEMENT Left 2004   Dr. Ronnie Derby   SPINE SURGERY     vertebroplasty T10, T12   SPINE SURGERY  11/02/2001    C4-5 & C5-6 Titanium Plate Placed Dr. Carloyn Manner   THYROIDECTOMY, PARTIAL Left 2007   TOTAL KNEE ARTHROPLASTY Right 2009   Dr. Alvan Dame     Allergies  Allergen Reactions   Bactrim     unknown   Betadine [Povidone Iodine]     unknown   Metformin And Related     Hurt stomach   Nutmeg Oil (Myristica Oil)     unknown   Sulfa Antibiotics     unknown    Allergies as of 05/28/2021       Reactions   Bactrim    unknown   Betadine [povidone Iodine]    unknown   Metformin And Related    Hurt stomach   Nutmeg Oil (myristica Oil)    unknown   Sulfa Antibiotics    unknown        Medication List        Accurate as of May 28, 2021 10:38 AM. If you have any questions, ask your nurse or doctor.          acetaminophen 500 MG tablet Commonly known as: TYLENOL Take 1,000 mg by mouth 2 (two) times daily as needed.   amLODipine 5 MG tablet Commonly known as: NORVASC Take 5 mg by mouth daily.   aspirin 81 MG EC tablet Take 1 tablet (81 mg total) by mouth daily.   betamethasone dipropionate 0.05 % ointment Commonly known as: DIPROLENE Apply topically. Apply BID PRN to active rash on extremities . 1 app immediately after shower. Twice A Day - PRN   diclofenac Sodium 1 % Gel Commonly known as: VOLTAREN Apply 2 g topically 2 (two) times daily as needed (knee pain). Apply to both knees   furosemide 20 MG tablet Commonly known as: LASIX Take 20 mg by mouth. Once A Day on Mon, Wed, Fri   losartan 50 MG tablet Commonly known as: Cozaar Take 1 tablet (50 mg total) by mouth daily.   metFORMIN 500 MG 24 hr tablet Commonly known  as: GLUCOPHAGE-XR Take 250 mg by mouth 2 (two) times daily. Take 1/2 tablet   metoprolol tartrate 25 MG tablet Commonly known as: LOPRESSOR Take 12.5 mg by mouth 2 (two) times daily. Hold Metoprolol for SBP < 110 and DSP <50   multivitamins ther. w/minerals Tabs tablet Take 1 tablet by mouth daily. 400 mcg tablet   nitroGLYCERIN 0.4 MG SL tablet Commonly known as: NITROSTAT Place 0.4 mg under the tongue every 5 (five) minutes as needed for chest pain. PLACE 0.4 MG UNDER TONGUE EVERY 5 MINUTES X3 FOR CHEST PAIN, IF NO RELIEF, CALL MD OR SEND TO ED FOR FURTHER EVALUATION.   potassium chloride 10 MEQ CR capsule Commonly known as: MICRO-K Take 10 mEq by mouth. Once A Day on Mon, Wed, Fri  Review of Systems  Constitutional:  Negative for activity change and appetite change.  HENT: Negative.    Respiratory:  Negative for cough and shortness of breath.   Cardiovascular:  Negative for leg swelling.  Gastrointestinal:  Negative for constipation.  Genitourinary: Negative.   Musculoskeletal:  Negative for arthralgias, gait problem and myalgias.  Skin: Negative.   Neurological:  Negative for dizziness and weakness.  Psychiatric/Behavioral:  Positive for confusion. Negative for dysphoric mood and sleep disturbance.    Immunization History  Administered Date(s) Administered   Influenza Whole 03/30/2012, 04/13/2013, 04/04/2018   Influenza, High Dose Seasonal PF 04/02/2019   Influenza-Unspecified 04/17/2014, 03/29/2015, 04/18/2021   Moderna SARS-COV2 Booster Vaccination 05/08/2020   Moderna Sars-Covid-2 Vaccination 07/02/2019, 07/30/2019, 11/27/2020   PFIZER Comirnaty(Gray Top)Covid-19 Tri-Sucrose Vaccine 03/19/2021   Pneumococcal Conjugate-13 12/08/2017   Pneumococcal Polysaccharide-23 06/30/2001   Td 06/30/2001, 12/08/2017   Pertinent  Health Maintenance Due  Topic Date Due   FOOT EXAM  12/21/2015   OPHTHALMOLOGY EXAM  04/02/2016   URINE MICROALBUMIN  02/11/2017    HEMOGLOBIN A1C  05/15/2021   INFLUENZA VACCINE  Completed   DEXA SCAN  Completed   Fall Risk 11/11/2020 11/12/2020 11/12/2020 11/13/2020 11/13/2020  Falls in the past year? - - - - -  Patient Fall Risk Level Low fall risk Moderate fall risk Moderate fall risk Moderate fall risk Moderate fall risk   Functional Status Survey:    Vitals:   05/28/21 1018  BP: 140/70  Pulse: 70  Resp: 20  Temp: 98.2 F (36.8 C)  SpO2: 95%  Weight: 117 lb 9.6 oz (53.3 kg)  Height: 5\' 3"  (1.6 m)   Body mass index is 20.83 kg/m. Physical Exam Vitals reviewed.  Constitutional:      Appearance: Normal appearance.  HENT:     Head: Normocephalic.     Nose: Nose normal.     Mouth/Throat:     Mouth: Mucous membranes are moist.     Pharynx: Oropharynx is clear.  Eyes:     Pupils: Pupils are equal, round, and reactive to light.  Cardiovascular:     Rate and Rhythm: Normal rate and regular rhythm.     Pulses: Normal pulses.     Heart sounds: Normal heart sounds. No murmur heard. Pulmonary:     Effort: Pulmonary effort is normal.     Breath sounds: Normal breath sounds.  Abdominal:     General: Abdomen is flat. Bowel sounds are normal.     Palpations: Abdomen is soft.  Musculoskeletal:        General: No swelling.     Cervical back: Neck supple.  Skin:    General: Skin is warm.  Neurological:     General: No focal deficit present.     Mental Status: She is alert and oriented to person, place, and time.  Psychiatric:        Mood and Affect: Mood normal.        Thought Content: Thought content normal.    Labs reviewed: Recent Labs    11/11/20 2052 11/12/20 0547 11/12/20 1500 11/13/20 0628 11/23/20 0000 02/12/21 0000  NA 133* 133*  --  136 138 137  K 4.4 4.1  --  4.0 4.4 4.3  CL 100 104  --  106 105 101  CO2 23 23  --  24 27* 29*  GLUCOSE 260* 174*  --  116*  --   --   BUN 35* 28*  --  17 14 18   CREATININE  1.30* 0.89  --  0.78 0.9 0.9  CALCIUM 11.7* 10.4*   < > 9.9 10.6 10.5  MG  --    --   --  1.8 2.0  --   PHOS  --   --   --  3.0  --   --    < > = values in this interval not displayed.   Recent Labs    11/11/20 2052 11/12/20 0547 11/13/20 0628 11/23/20 0000  AST 21 18  --  22  ALT 23 20  --  16  ALKPHOS 65 55  --  62  BILITOT 0.8 0.7  --   --   PROT 6.9 5.5*  --   --   ALBUMIN 4.2 3.3* 3.0* 3.8   Recent Labs    11/11/20 2052 11/12/20 0547 11/13/20 0628 11/23/20 0000 02/12/21 0000  WBC 11.8* 12.8* 8.7 9.9 10.0  NEUTROABS 7.0  --   --  6,287.00 5,290.00  HGB 13.2 11.8* 11.3* 12.0 12.2  HCT 39.1 35.1* 34.2* 37 37  MCV 91.4 91.9 94.0  --   --   PLT 322 271 242 284 293   Lab Results  Component Value Date   TSH 0.534 11/12/2020   Lab Results  Component Value Date   HGBA1C 7.8 (H) 11/12/2020   Lab Results  Component Value Date   CHOL 192 03/13/2020   HDL 66 03/13/2020   LDLCALC 107 03/13/2020   TRIG 95 03/13/2020   CHOLHDL 2.4 06/27/2018    Significant Diagnostic Results in last 30 days:  No results found.  Assessment/Plan  DM type 2 with diabetic peripheral neuropathy (HCC) On Low dose of Metformin Order Eye exam  Hypercalcemia PTH in Normal Limits PTHR low. Calcitrol in Normal Limits Has had work up before Repeat CMP Essential hypertension On Cozaar,Norvasc and Lopressor Cognitive impairment Repeat MMSE Very functional Walks with her walker Doing well in [place] Chronic diastolic congestive heart failure (HCC) Low dose of Lasix Repeat CMP Hyperlipidemia,  Not on statin Repeat Lipid panel  Family/ staff Communication:   Labs/tests ordered:  CBC,CMP,LIPid A1C

## 2021-05-29 ENCOUNTER — Encounter: Payer: Self-pay | Admitting: Nurse Practitioner

## 2021-05-29 ENCOUNTER — Non-Acute Institutional Stay (INDEPENDENT_AMBULATORY_CARE_PROVIDER_SITE_OTHER): Payer: Medicare Other | Admitting: Nurse Practitioner

## 2021-05-29 DIAGNOSIS — Z0000 Encounter for general adult medical examination without abnormal findings: Secondary | ICD-10-CM | POA: Diagnosis not present

## 2021-05-30 ENCOUNTER — Encounter: Payer: Self-pay | Admitting: Nurse Practitioner

## 2021-05-30 NOTE — Progress Notes (Addendum)
Subjective:   [name] is a 87 y.o. female who presents for Medicare Annual (Subsequent) preventive examination at [facility].     Objective:    Today's Vitals   05/29/21 1048 05/30/21 1220  BP: 130/70   Pulse: 70   Resp: 20   Temp: (!) 97.4 F (36.3 C)   SpO2: 97%   Weight: 117 lb 9.6 oz (53.3 kg)   Height: 5\' 3"  (1.6 m)   PainSc:  5    Body mass index is 20.83 kg/m.  Advanced Directives 05/29/2021 02/04/2021 01/25/2021 12/24/2020 11/14/2020 11/12/2020 11/12/2020  Does Patient Have a Medical Advance Directive? Yes Yes Yes Yes Yes - Yes  Type of Advance Directive Living will;Out of facility DNR (pink MOST or yellow form) [place];Living will;Out of facility DNR (pink MOST or yellow form) [place];Living will;Out of facility DNR (pink MOST or yellow form) [place];Living will;Out of facility DNR (pink MOST or yellow form) [place];Living will;Out of facility DNR (pink MOST or yellow form) - [place];Living will;Out of facility DNR (pink MOST or yellow form)  Does patient want to make changes to medical advance directive? - No - Patient declined No - Patient declined No - Patient declined No - Patient declined No - Patient declined No - Patient declined  Copy of [place] in Chart? - Yes - validated most recent copy scanned in chart (See row information) Yes - validated most recent copy scanned in chart (See row information) Yes - validated most recent copy scanned in chart (See row information) Yes - validated most recent copy scanned in chart (See row information) - -  Would patient like information on creating a medical advance directive? - - - - - - -  Pre-existing out of facility DNR order (yellow form or pink MOST form) Yellow form placed in chart (order not valid for inpatient use) Yellow form placed in chart (order not valid for  inpatient use) Yellow form placed in chart (order not valid for inpatient use) Yellow form placed in chart (order not valid for inpatient use) Yellow form placed in chart (order not valid for inpatient use) - -    Current Medications (verified) Outpatient Encounter Medications as of 05/29/2021  Medication Sig   acetaminophen (TYLENOL) 500 MG tablet Take 1,000 mg by mouth 2 (two) times daily as needed.   amLODipine (NORVASC) 5 MG tablet Take 5 mg by mouth daily.    aspirin EC 81 MG EC tablet Take 1 tablet (81 mg total) by mouth daily.   betamethasone dipropionate (DIPROLENE) 0.05 % ointment Apply topically. Apply BID PRN to active rash on extremities . 1 app immediately after shower. Twice A Day - PRN   diclofenac Sodium (VOLTAREN) 1 % GEL Apply 2 g topically 2 (two) times daily as needed (knee pain). Apply to both knees   furosemide (LASIX) 20 MG tablet Take 20 mg by mouth. Once A Day on Mon, Wed, Fri   losartan (COZAAR) 50 MG tablet Take 1 tablet (50 mg total) by mouth daily.   metFORMIN (GLUCOPHAGE-XR) 500 MG 24 hr tablet Take 250 mg by mouth 2 (two) times daily. Take 1/2 tablet   metoprolol tartrate (LOPRESSOR) 25 MG tablet Take 12.5 mg by mouth 2 (two) times daily. Hold Metoprolol for SBP < 110 and DSP <50   Multiple Vitamins-Minerals (MULTIVITAMINS THER. W/MINERALS) TABS tablet Take 1 tablet by mouth daily.  400 mcg tablet   nitroGLYCERIN (NITROSTAT) 0.4 MG SL tablet Place 0.4 mg under the tongue every 5 (five) minutes as needed for chest pain. PLACE 0.4 MG UNDER TONGUE EVERY 5 MINUTES X3 FOR CHEST PAIN, IF NO RELIEF, CALL MD OR SEND TO ED FOR FURTHER EVALUATION.   potassium chloride (MICRO-K) 10 MEQ CR capsule Take 10 mEq by mouth. Once A Day on Mon, Wed, Fri   No facility-administered encounter medications on file as of 05/29/2021.    Allergies (verified) Bactrim, Betadine [povidone iodine], Metformin and related, Nutmeg oil (myristica oil), and Sulfa antibiotics   History: Past  Medical History:  Diagnosis Date   Contact dermatitis and other eczema, due to unspecified cause    Cortical senile cataract    GERD (gastroesophageal reflux disease) 02/17/2013   Hyperparathyroidism, unspecified ([place]) 03/26/2010   Irritable bowel syndrome    Keratoconjunctivitis sicca, not specified as Sjogren's    Loss of weight    Macular degeneration (senile) of retina, unspecified    Memory loss    Other and unspecified hyperlipidemia    Other malaise and fatigue 06/17/2010   Pain in joint, site unspecified    Pathologic fracture of vertebrae    Reflux esophagitis    Scoliosis (and kyphoscoliosis), idiopathic    Senile osteoporosis    Type II or unspecified type diabetes mellitus without mention of complication, uncontrolled    Unspecified essential hypertension    Unspecified hereditary and idiopathic peripheral neuropathy    Unspecified vitamin D deficiency    Past Surgical History:  Procedure Laterality Date   APPENDECTOMY     BREAST LUMPECTOMY  02/05/2010   Dr. Gershon Mussel Cornett   CATARACT EXTRACTION W/ INTRAOCULAR LENS  IMPLANT, BILATERAL  04/2013   Dr. Bing Plume   CHOLECYSTECTOMY  2004   Dr. Zella Richer   COLONOSCOPY  2001   Dr. Collene Mares   fibroidectomy  1950   HEMORRHOID SURGERY  1978   KYPHOPLASTY  2010   T10 & T12 Dr. Erline Levine   MEDIAL PARTIAL KNEE REPLACEMENT Left 2004   Dr. Ronnie Derby   SPINE SURGERY     vertebroplasty T10, T12   SPINE SURGERY  11/02/2001    C4-5 & C5-6 Titanium Plate Placed Dr. Carloyn Manner   THYROIDECTOMY, PARTIAL Left 2007   TOTAL KNEE ARTHROPLASTY Right 2009   Dr. Alvan Dame    Family History  Problem Relation Age of Onset   Diabetes Mother    Heart disease Mother        CHF   Stroke Father    Heart disease Sister        MI   Colon cancer Neg Hx    Stomach cancer Neg Hx    Esophageal cancer Neg Hx    Rectal cancer Neg Hx    Liver cancer Neg Hx    Social History   Socioeconomic History   Marital status: Widowed    Spouse name: Not on file   Number of  children: 0   Years of education: Not on file   Highest education level: Not on file  Occupational History   Occupation: retired Marketing executive: RETIRED  Tobacco Use   Smoking status: Never   Smokeless tobacco: Never  Substance and Sexual Activity   Alcohol use: No   Drug use: No   Sexual activity: Never    Partners: Male    Birth control/protection: Post-menopausal  Other Topics Concern   Not on file  Social History Narrative  Lives at [facility] since 02/06/2009   Widowed    No childred   Living Will   Social Determinants of Health   Financial Resource Strain: Not on file  Food Insecurity: Not on file  Transportation Needs: Not on file  Physical Activity: Not on file  Stress: Not on file  Social Connections: Not on file    Tobacco Counseling Counseling given: Not Answered   Clinical Intake:  Pre-visit preparation completed: Yes  Pain : 0-10 Pain Score: 5  Pain Location: Knee Pain Orientation: Right, Left Pain Descriptors / Indicators: Aching, Dull, Nagging Pain Onset: More than a month ago Pain Frequency: Several days a week Pain Relieving Factors: rest, Tyelnol  Pain Relieving Factors: rest, Tyelnol  BMI - recorded: 20.83 Nutritional Status: BMI of 19-24  Normal Nutritional Risks: None Diabetes: Yes CBG done?: No Did pt. bring in CBG monitor from home?: No Glucose Meter Downloaded?: No  How often do you need to have someone help you when you read instructions, pamphlets, or other written materials from your doctor or pharmacy?: 1 - Never What is the last grade level you completed in school?: Master degree  Diabetic?yes  Interpreter Needed?: No  Information entered by :: Adi Seales Bretta Bang NP   Activities of Daily Living In your present state of health, do you have any difficulty performing the following activities: 05/30/2021 11/12/2020  Hearing? N Y  Vision? N Y  Difficulty concentrating or making decisions? N Y   Walking or climbing stairs? Y Y  Dressing or bathing? Y N  Doing errands, shopping? Tempie Donning  Preparing Food and eating ? N -  Using the Toilet? N -  In the past six months, have you accidently leaked urine? N -  Do you have problems with loss of bowel control? N -  Managing your Medications? Y -  Managing your Finances? Y -  Housekeeping or managing your Housekeeping? Y -  Some recent data might be hidden    Patient Care Team: Radie Berges X, NP as PCP - General (Internal Medicine) [place], [facility] [place] X, NP as Nurse Practitioner (Nurse Practitioner) Jacelyn Pi, MD as Consulting Physician (Endocrinology) Calvert Cantor, MD as Consulting Physician (Ophthalmology) Vickey Huger, MD as Consulting Physician (Orthopedic Surgery) Suella Broad, MD as Consulting Physician (Physical Medicine and Rehabilitation) Erline Levine, MD as Consulting Physician (Neurosurgery) Juanita Craver, MD as Consulting Physician (Gastroenterology) [place], Peter M, MD as Consulting Physician (Cardiology) Megan Salon, MD as Consulting Physician (Gynecology)  Indicate any recent Medical Services you may have received from other than [facility] providers in the past year (date may be approximate).     Assessment:   This is a routine wellness examination for Aeron.  Hearing/Vision screen No results found.  Dietary issues and exercise activities discussed: Current Exercise Habits: The patient does not participate in regular exercise at present (ambulates with walker daily), Exercise limited by: cardiac condition(s);orthopedic condition(s)   Goals Addressed             This Visit's Progress    Maintain Mobility and Function       Evidence-based guidance:  Emphasize the importance of physical activity and aerobic exercise as included in treatment plan; assess barriers to adherence; consider patient's abilities and preferences.  Encourage gradual increase in activity or exercise instead of stopping  if pain occurs.  Reinforce individual therapy exercise prescription, such as strengthening, stabilization and stretching programs.  Promote optimal body mechanics to stabilize the spine with lifting  and functional activity.  Encourage activity and mobility modifications to facilitate optimal function, such as using a log roll for bed mobility or dressing from a seated position.  Reinforce individual adaptive equipment recommendations to limit excessive spinal movements, such as a Systems analyst.  Assess adequacy of sleep; encourage use of sleep hygiene techniques, such as bedtime routine; use of white noise; dark, cool bedroom; avoiding daytime naps, heavy meals or exercise before bedtime.  Promote positions and modification to optimize sleep and sexual activity; consider pillows or positioning devices to assist in maintaining neutral spine.  Explore options for applying ergonomic principles at work and home, such as frequent position changes, using ergonomically designed equipment and working at optimal height.  Promote modifications to increase comfort with driving such as lumbar support, optimizing seat and steering wheel position, using cruise control and taking frequent rest stops to stretch and walk.   Notes:        Depression Screen PHQ 2/9 Scores 11/24/2017 07/09/2017 11/01/2015 06/21/2015 12/21/2014 01/03/2014 12/08/2013  PHQ - 2 Score 0 0 0 0 0 0 0    Fall Risk Fall Risk  11/24/2017 07/09/2017 11/01/2015 07/26/2015 06/21/2015  Falls in the past year? No No No No No    FALL RISK PREVENTION PERTAINING TO THE HOME:  Any stairs in or around the home? Yes  If so, are there any without handrails? No  Home free of loose throw rugs in walkways, pet beds, electrical cords, etc? Yes  Adequate lighting in your home to reduce risk of falls? Yes   ASSISTIVE DEVICES UTILIZED TO PREVENT FALLS:  Life alert? No  Use of a cane, walker or w/c? Yes  Grab bars in the bathroom? Yes  Shower chair or  bench in shower? Yes  Elevated toilet seat or a handicapped toilet? Yes   TIMED UP AND GO:  Was the test performed? Yes .  Length of time to ambulate 10 feet: 10 sec.   Gait steady and fast with assistive device  Cognitive Function: MMSE - Mini Mental State Exam 11/09/2017 05/01/2016 06/08/2014 02/17/2013  Orientation to time 3 5 5 4   Orientation to Place 4 5 5 5   Registration 2 3 3 3   Attention/ Calculation 5 5 5 5   Recall 2 2 3 3   Language- name 2 objects 2 2 2 2   Language- repeat 1 1 1 1   Language- follow 3 step command 3 3 3 3   Language- read & follow direction 1 1 1 1   Write a sentence 1 1 1 1   Copy design 0 1 1 1   Total score 24 29 30 29         Immunizations Immunization History  Administered Date(s) Administered   Influenza Whole 03/30/2012, 04/13/2013, 04/04/2018   Influenza, High Dose Seasonal PF 04/02/2019   Influenza-Unspecified 04/17/2014, 03/29/2015, 04/18/2021   Moderna SARS-COV2 Booster Vaccination 05/08/2020   Moderna Sars-Covid-2 Vaccination 07/02/2019, 07/30/2019, 11/27/2020   PFIZER Comirnaty(Gray Top)Covid-19 Tri-Sucrose Vaccine 03/19/2021   Pneumococcal Conjugate-13 12/08/2017   Pneumococcal Polysaccharide-23 06/30/2001   Td 06/30/2001, 12/08/2017    TDAP status: Due, Education has been provided regarding the importance of this vaccine. Advised may receive this vaccine at local pharmacy or Health Dept. Aware to provide a copy of the vaccination record if obtained from local pharmacy or Health Dept. Verbalized acceptance and understanding.  Flu Vaccine status: Up to date  Pneumococcal vaccine status: Up to date  Covid-19 vaccine status: Information provided on how to obtain vaccines.   Qualifies for Shingles  Vaccine? Yes   Zostavax completed No   Shingrix Completed?: No.    Education has been provided regarding the importance of this vaccine. Patient has been advised to call insurance company to determine out of pocket expense if they have not yet  received this vaccine. Advised may also receive vaccine at local pharmacy or Health Dept. Verbalized acceptance and understanding.  Screening Tests Health Maintenance  Topic Date Due   Zoster Vaccines- Shingrix (1 of 2) Never done   FOOT EXAM  12/21/2015   OPHTHALMOLOGY EXAM  04/02/2016   COVID-19 Vaccine (5 - Booster for Moderna series) 05/14/2021   HEMOGLOBIN A1C  05/15/2021   TETANUS/TDAP  12/09/2027   Pneumonia Vaccine 31+ Years old  Completed   INFLUENZA VACCINE  Completed   DEXA SCAN  Completed   HPV VACCINES  Aged Out    Health Maintenance  Health Maintenance Due  Topic Date Due   Zoster Vaccines- Shingrix (1 of 2) Never done   FOOT EXAM  12/21/2015   OPHTHALMOLOGY EXAM  04/02/2016   COVID-19 Vaccine (5 - Booster for Moderna series) 05/14/2021   HEMOGLOBIN A1C  05/15/2021    Colorectal cancer screening: No longer required.   Mammogram status: No longer required due to aged out.  Bone Density status: Ordered  . Pt provided with contact info and advised to call to schedule appt.  Lung Cancer Screening: (Low Dose CT Chest recommended if Age 55-80 years, 30 pack-year currently smoking OR have quit w/in 15years.) does not qualify.   Lung Cancer Screening Referral: none  Additional Screening:  Hepatitis C Screening: does not qualify  Vision Screening: Recommended annual ophthalmology exams for early detection of glaucoma and other disorders of the eye. Is the patient up to date with their annual eye exam?  No  Who is the provider or what is the name of the office in which the patient attends annual eye exams? Will refer to ophthalmology  If pt is not established with a provider, would they like to be referred to a provider to establish care? No .   Dental Screening: Recommended annual dental exams for proper oral hygiene  Community Resource Referral / Chronic Care Management: CRR required this visit?  No   CCM required this visit?  No      Plan:     I have  personally reviewed and noted the following in the patient's chart:   Medical and social history Use of alcohol, tobacco or illicit drugs  Current medications and supplements including opioid prescriptions.  Functional ability and status Nutritional status Physical activity Advanced directives List of other physicians Hospitalizations, surgeries, and ER visits in previous 12 months Vitals Screenings to include cognitive, depression, and falls Referrals and appointments  In addition, I have reviewed and discussed with patient certain preventive protocols, quality metrics, and best practice recommendations. A written personalized care plan for preventive services as well as general preventive health recommendations were provided to patient.   Orders: DEXA, Ophthalmology referral, Shingrix, tdap, MMSE if HPOA consents.   Michaelanthony Kempton X Montserrath Madding, NP   05/30/2021

## 2021-06-07 LAB — HEMOGLOBIN A1C: Hemoglobin A1C: 8

## 2021-06-07 LAB — HEPATIC FUNCTION PANEL
ALT: 10 U/L (ref 7–35)
AST: 17 (ref 13–35)
Alkaline Phosphatase: 47 (ref 25–125)
Bilirubin, Total: 0.5

## 2021-06-07 LAB — BASIC METABOLIC PANEL
BUN: 22 — AB (ref 4–21)
CO2: 25 — AB (ref 13–22)
Chloride: 105 (ref 99–108)
Creatinine: 1.1 (ref 0.5–1.1)
Glucose: 99
Potassium: 4.8 mEq/L (ref 3.5–5.1)
Sodium: 138 (ref 137–147)

## 2021-06-07 LAB — CBC AND DIFFERENTIAL
HCT: 33 — AB (ref 36–46)
Hemoglobin: 11.2 — AB (ref 12.0–16.0)
Neutrophils Absolute: 3552
Platelets: 250 10*3/uL (ref 150–400)
WBC: 7.4

## 2021-06-07 LAB — LIPID PANEL
Cholesterol: 184 (ref 0–200)
HDL: 58 (ref 35–70)
LDL Cholesterol: 111
LDl/HDL Ratio: 3.2
Triglycerides: 68 (ref 40–160)

## 2021-06-07 LAB — COMPREHENSIVE METABOLIC PANEL
Albumin: 3.4 — AB (ref 3.5–5.0)
Calcium: 10.4 (ref 8.7–10.7)
Globulin: 2.3

## 2021-06-07 LAB — CBC: RBC: 3.65 — AB (ref 3.87–5.11)

## 2021-07-26 ENCOUNTER — Encounter: Payer: Self-pay | Admitting: Nurse Practitioner

## 2021-07-26 ENCOUNTER — Non-Acute Institutional Stay: Payer: Medicare Other | Admitting: Nurse Practitioner

## 2021-07-26 DIAGNOSIS — E1142 Type 2 diabetes mellitus with diabetic polyneuropathy: Secondary | ICD-10-CM | POA: Diagnosis not present

## 2021-07-26 DIAGNOSIS — I10 Essential (primary) hypertension: Secondary | ICD-10-CM

## 2021-07-26 DIAGNOSIS — N183 Chronic kidney disease, stage 3 unspecified: Secondary | ICD-10-CM

## 2021-07-26 DIAGNOSIS — R413 Other amnesia: Secondary | ICD-10-CM

## 2021-07-26 DIAGNOSIS — I5032 Chronic diastolic (congestive) heart failure: Secondary | ICD-10-CM | POA: Diagnosis not present

## 2021-07-26 DIAGNOSIS — E1122 Type 2 diabetes mellitus with diabetic chronic kidney disease: Secondary | ICD-10-CM | POA: Diagnosis not present

## 2021-07-26 NOTE — Progress Notes (Addendum)
Location:   [facility] Room Number: 379 Place of Service:  ALF [phone]) Provider:  Brendin Situ X, NP  Melchor Kirchgessner X, NP  Patient Care Team: Daianna Vasques X, NP as PCP - General (Internal Medicine) [place] Iyona Pehrson X, NP as Nurse Practitioner (Nurse Practitioner) Jacelyn Pi, MD as Consulting Physician (Endocrinology) Calvert Cantor, MD as Consulting Physician (Ophthalmology) Vickey Huger, MD as Consulting Physician (Orthopedic Surgery) Suella Broad, MD as Consulting Physician (Physical Medicine and Rehabilitation) Erline Levine, MD as Consulting Physician (Neurosurgery) Juanita Craver, MD as Consulting Physician (Gastroenterology) Martinique, Peter M, MD as Consulting Physician (Cardiology) Megan Salon, MD as Consulting Physician (Gynecology)  Extended Emergency Contact Information Primary Emergency Contact: Ladene Artist of [place] Phone: [phone] Relation: Niece Secondary Emergency Contact: Edmon Crape Mobile Phone: [phone] Relation: Other Preferred language: Cleophus Molt Interpreter needed? No Guardian: Meryl Dare Mobile Phone: [phone] Relation: Legal Guardian Preferred language: English Interpreter needed? No  Code Status:  DNR Goals of care: Advanced Directive information Advanced Directives 07/26/2021  Does Patient Have a Medical Advance Directive? Yes  Type of Advance Directive Living will;Out of facility DNR (pink MOST or yellow form)  Does patient want to make changes to medical advance directive? No - Patient declined  Copy of [place] in Chart? -  Would patient like information on creating a medical advance directive? -  Pre-existing out of facility DNR order (yellow form or pink MOST form) Yellow form placed in chart (order not valid for inpatient use)     Chief Complaint  Patient presents with   Medical Management of Chronic Issues    Routine follow up visit.    Health Maintenance    Shingrix vaccine,foot exam, eye exam, urine microalbumin, 3rd COVID booster, Hemoglobin A1C    HPI:  Pt is a 88 y.o. female seen today for medical management of chronic diseases.      Hypercalcemia, Ca 11.7>>9.9(corrected to 10.7, PTH 35, s/p Zometa x1, PTHrP <2.0 11/11/20,  Hyperparathyroidism, workup with Dr. Chalmers Cater, Ca 10.5 02/12/21             T2DM, Hgb a1c 7.8 11/12/20<< Hgb a1c 8.0 06/06/21, takes Metformin             HTN, takes Losartan, Metoprolol, Amlodipine. Bun/creat 22/1.08 06/06/21  CKD stage 3 Bun/creat 22/1.08 06/06/21             CHF, chronic edema BLE, off diuretics, 04/16/21 Echo EF 19-41%, diastolic dysfunction.              Cognitive impairment, at her baseline.   Past Medical History:  Diagnosis Date   Contact dermatitis and other eczema, due to unspecified cause    Cortical senile cataract    GERD (gastroesophageal reflux disease) 02/17/2013   Hyperparathyroidism, unspecified ([place]) 03/26/2010   Irritable bowel syndrome    Keratoconjunctivitis sicca, not specified as Sjogren's    Loss of weight    Macular degeneration (senile) of retina, unspecified    Memory loss    Other and unspecified hyperlipidemia    Other malaise and fatigue 06/17/2010   Pain in joint, site unspecified    Pathologic fracture of vertebrae    Reflux esophagitis    Scoliosis (and kyphoscoliosis), idiopathic    Senile osteoporosis    Type II or unspecified type diabetes mellitus without mention of complication, uncontrolled    Unspecified essential hypertension    Unspecified hereditary and idiopathic peripheral neuropathy  Unspecified vitamin D deficiency    Past Surgical History:  Procedure Laterality Date   APPENDECTOMY     BREAST LUMPECTOMY  02/05/2010   Dr. Gershon Mussel Cornett   CATARACT EXTRACTION W/ INTRAOCULAR LENS  IMPLANT, BILATERAL  04/2013   Dr. Bing Plume   CHOLECYSTECTOMY  2004   Dr. Zella Richer   COLONOSCOPY  2001   Dr. Collene Mares    fibroidectomy  1950   HEMORRHOID SURGERY  1978   KYPHOPLASTY  2010   T10 & T12 Dr. Erline Levine   MEDIAL PARTIAL KNEE REPLACEMENT Left 2004   Dr. Ronnie Derby   SPINE SURGERY     vertebroplasty T10, T12   SPINE SURGERY  11/02/2001    C4-5 & C5-6 Titanium Plate Placed Dr. Carloyn Manner   THYROIDECTOMY, PARTIAL Left 2007   TOTAL KNEE ARTHROPLASTY Right 2009   Dr. Alvan Dame     Allergies  Allergen Reactions   Bactrim     unknown   Betadine [Povidone Iodine]     unknown   Metformin And Related     Hurt stomach   Nutmeg Oil (Myristica Oil)     unknown   Sulfa Antibiotics     unknown    Allergies as of 07/26/2021       Reactions   Bactrim    unknown   Betadine [povidone Iodine]    unknown   Metformin And Related    Hurt stomach   Nutmeg Oil (myristica Oil)    unknown   Sulfa Antibiotics    unknown        Medication List        Accurate as of July 26, 2021 11:59 PM. If you have any questions, ask your nurse or doctor.          acetaminophen 500 MG tablet Commonly known as: TYLENOL Take 1,000 mg by mouth 2 (two) times daily as needed.   amLODipine 5 MG tablet Commonly known as: NORVASC Take 5 mg by mouth daily.   aspirin 81 MG EC tablet Take 1 tablet (81 mg total) by mouth daily.   betamethasone dipropionate 0.05 % ointment Commonly known as: DIPROLENE Apply topically. Apply BID PRN to active rash on extremities . 1 app immediately after shower. Twice A Day - PRN   diclofenac Sodium 1 % Gel Commonly known as: VOLTAREN Apply 2 g topically 2 (two) times daily as needed (knee pain). Apply to both knees   furosemide 20 MG tablet Commonly known as: LASIX Take 20 mg by mouth. Once A Day on Mon, Wed, Fri   losartan 50 MG tablet Commonly known as: Cozaar Take 1 tablet (50 mg total) by mouth daily.   metFORMIN 500 MG 24 hr tablet Commonly known as: GLUCOPHAGE-XR Take 250 mg by mouth 2 (two) times daily. Take 1/2 tablet   metoprolol tartrate 25 MG  tablet Commonly known as: LOPRESSOR Take 12.5 mg by mouth 2 (two) times daily. Hold Metoprolol for SBP < 110 and DSP <50   multivitamins ther. w/minerals Tabs tablet Take 1 tablet by mouth daily. 400 mcg tablet   nitroGLYCERIN 0.4 MG SL tablet Commonly known as: NITROSTAT Place 0.4 mg under the tongue every 5 (five) minutes as needed for chest pain. PLACE 0.4 MG UNDER TONGUE EVERY 5 MINUTES X3 FOR CHEST PAIN, IF NO RELIEF, CALL MD OR SEND TO ED FOR FURTHER EVALUATION.   potassium chloride 10 MEQ CR capsule Commonly known as: MICRO-K Take 10 mEq by mouth. Once A Day on Mon, Wed, Fri  Review of Systems  Constitutional:  Negative for activity change, appetite change and fever.  HENT:  Positive for hearing loss. Negative for congestion and trouble swallowing.   Respiratory:  Negative for cough and shortness of breath.   Cardiovascular:  Positive for leg swelling. Negative for chest pain and palpitations.  Gastrointestinal: Negative.  Negative for abdominal distention, abdominal pain, constipation, diarrhea, nausea, rectal pain and vomiting.       Urge incontinent of bowel.   Genitourinary:  Negative for dysuria and urgency.  Musculoskeletal:  Positive for arthralgias and gait problem.       Left knee pain when walking too much.   Skin:  Negative for color change.  Neurological:  Negative for speech difficulty, weakness and light-headedness.  Psychiatric/Behavioral:  Negative for confusion and sleep disturbance. The patient is not nervous/anxious.    Immunization History  Administered Date(s) Administered   Influenza Whole 03/30/2012, 04/13/2013, 04/04/2018   Influenza, High Dose Seasonal PF 04/02/2019   Influenza-Unspecified 04/17/2014, 03/29/2015, 04/18/2021   Moderna SARS-COV2 Booster Vaccination 05/08/2020   Moderna Sars-Covid-2 Vaccination 07/02/2019, 07/30/2019, 11/27/2020   PFIZER Comirnaty(Gray Top)Covid-19 Tri-Sucrose Vaccine 03/19/2021   Pneumococcal  Conjugate-13 12/08/2017   Pneumococcal Polysaccharide-23 06/30/2001   Td 06/30/2001, 12/08/2017   Pertinent  Health Maintenance Due  Topic Date Due   FOOT EXAM  12/21/2015   OPHTHALMOLOGY EXAM  04/02/2016   URINE MICROALBUMIN  02/11/2017   HEMOGLOBIN A1C  05/15/2021   INFLUENZA VACCINE  Completed   DEXA SCAN  Completed   Fall Risk 11/11/2020 11/12/2020 11/12/2020 11/13/2020 11/13/2020  Falls in the past year? - - - - -  Patient Fall Risk Level Low fall risk Moderate fall risk Moderate fall risk Moderate fall risk Moderate fall risk   Functional Status Survey:    Vitals:   07/26/21 0942  BP: 140/70  Pulse: 68  Resp: (!) 24  Temp: (!) 97.4 F (36.3 C)  SpO2: 94%  Weight: 116 lb 12.8 oz (53 kg)  Height: 5\' 3"  (1.6 m)   Body mass index is 20.69 kg/m. Physical Exam Vitals and nursing note reviewed.  Constitutional:      Appearance: Normal appearance.  HENT:     Head: Normocephalic and atraumatic.     Mouth/Throat:     Mouth: Mucous membranes are moist.  Eyes:     Extraocular Movements: Extraocular movements intact.     Conjunctiva/sclera: Conjunctivae normal.     Pupils: Pupils are equal, round, and reactive to light.  Cardiovascular:     Rate and Rhythm: Normal rate and regular rhythm.     Heart sounds: No murmur heard.    Comments: DP pulses present R+L Pulmonary:     Effort: Pulmonary effort is normal.     Breath sounds: No rales.  Abdominal:     General: Bowel sounds are normal. There is no distension.     Palpations: Abdomen is soft.     Tenderness: There is no abdominal tenderness. There is no right CVA tenderness, left CVA tenderness, guarding or rebound.  Genitourinary:    Comments: External hemorrhoids, no injury or bleeding.  Musculoskeletal:     Cervical back: Normal range of motion and neck supple.     Right lower leg: Edema present.     Left lower leg: Edema present.     Comments: Kyphoscoliosis. 1+ edema BLE  Skin:    General: Skin is warm  and dry.     Comments: Yellow, thick, long toenails.   Neurological:  General: No focal deficit present.     Mental Status: She is alert. Mental status is at baseline.     Motor: No weakness.     Coordination: Coordination normal.     Gait: Gait abnormal.     Comments: Oriented to person, place.   Psychiatric:        Mood and Affect: Mood normal.        Behavior: Behavior normal.        Thought Content: Thought content normal.        Judgment: Judgment normal.    Labs reviewed: Recent Labs    11/11/20 2052 11/12/20 0547 11/12/20 1500 11/13/20 0628 11/23/20 0000 02/12/21 0000  NA 133* 133*  --  136 138 137  K 4.4 4.1  --  4.0 4.4 4.3  CL 100 104  --  106 105 101  CO2 23 23  --  24 27* 29*  GLUCOSE 260* 174*  --  116*  --   --   BUN 35* 28*  --  17 14 18   CREATININE 1.30* 0.89  --  0.78 0.9 0.9  CALCIUM 11.7* 10.4*   < > 9.9 10.6 10.5  MG  --   --   --  1.8 2.0  --   PHOS  --   --   --  3.0  --   --    < > = values in this interval not displayed.   Recent Labs    11/11/20 2052 11/12/20 0547 11/13/20 0628 11/23/20 0000  AST 21 18  --  22  ALT 23 20  --  16  ALKPHOS 65 55  --  62  BILITOT 0.8 0.7  --   --   PROT 6.9 5.5*  --   --   ALBUMIN 4.2 3.3* 3.0* 3.8   Recent Labs    11/11/20 2052 11/12/20 0547 11/13/20 0628 11/23/20 0000 02/12/21 0000  WBC 11.8* 12.8* 8.7 9.9 10.0  NEUTROABS 7.0  --   --  6,287.00 5,290.00  HGB 13.2 11.8* 11.3* 12.0 12.2  HCT 39.1 35.1* 34.2* 37 37  MCV 91.4 91.9 94.0  --   --   PLT 322 271 242 284 293   Lab Results  Component Value Date   TSH 0.534 11/12/2020   Lab Results  Component Value Date   HGBA1C 7.8 (H) 11/12/2020   Lab Results  Component Value Date   CHOL 192 03/13/2020   HDL 66 03/13/2020   LDLCALC 107 03/13/2020   TRIG 95 03/13/2020   CHOLHDL 2.4 06/27/2018    Significant Diagnostic Results in last 30 days:  No results found.  Assessment/Plan Onychomycosis of multiple toenails with type 2 diabetes  mellitus and peripheral neuropathy (HCC) Hgb a1c 7.8 11/12/20<< Hgb a1c 8.0 06/06/21, takes Metformin. Refer to Podiatrist for toe nail care.   Essential hypertension Blood pressure is controlled, takes Losartan, Metoprolol, Amlodipine. Bun/creat 22/1.08 06/06/21  CKD stage 3 due to type 2 diabetes mellitus ([place])  Bun/creat 22/1.08 06/06/21 Albumin/creat 55 08/31/17  Diastolic CHF (HCC) chronic edema BLE, off diuretics, 04/16/21 Echo EF 37-90%, diastolic dysfunction.   Memory loss At her baseline mentation.   Hypercalcemia Ca 11.7>>9.9(corrected to 10.7, PTH 35, s/p Zometa x1, PTHrP <2.0 11/11/20,  Hyperparathyroidism, workup with Dr. Chalmers Cater, Ca 10.5 02/12/21     Family/ staff Communication: plan of care reviewed with the patient and charge nurse.   Labs/tests ordered: urine micro albumin.   Time spend 40 minutes.

## 2021-07-30 ENCOUNTER — Encounter: Payer: Self-pay | Admitting: Nurse Practitioner

## 2021-07-30 NOTE — Assessment & Plan Note (Addendum)
chronic edema BLE, off diuretics, 04/16/21 Echo EF 01-10%, diastolic dysfunction.

## 2021-07-30 NOTE — Assessment & Plan Note (Signed)
At her baseline mentation.

## 2021-07-30 NOTE — Assessment & Plan Note (Addendum)
Bun/creat 22/1.08 06/06/21 Albumin/creat 55 08/06/21

## 2021-07-30 NOTE — Assessment & Plan Note (Signed)
Blood pressure is controlled, takes Losartan, Metoprolol, Amlodipine. Bun/creat 22/1.08 06/06/21

## 2021-07-30 NOTE — Assessment & Plan Note (Signed)
Ca 11.7>>9.9(corrected to 10.7, PTH 35, s/p Zometa x1, PTHrP <2.0 11/11/20,  Hyperparathyroidism, workup with Dr. Chalmers Cater, Ca 10.5 02/12/21

## 2021-07-30 NOTE — Assessment & Plan Note (Addendum)
Hgb a1c 7.8 11/12/20<< Hgb a1c 8.0 06/06/21, takes Metformin. Refer to Podiatrist for toe nail care.

## 2021-10-22 ENCOUNTER — Non-Acute Institutional Stay: Payer: Medicare Other | Admitting: Internal Medicine

## 2021-10-22 ENCOUNTER — Encounter: Payer: Self-pay | Admitting: Internal Medicine

## 2021-10-22 DIAGNOSIS — F418 Other specified anxiety disorders: Secondary | ICD-10-CM

## 2021-10-22 DIAGNOSIS — E1142 Type 2 diabetes mellitus with diabetic polyneuropathy: Secondary | ICD-10-CM | POA: Diagnosis not present

## 2021-10-22 DIAGNOSIS — E785 Hyperlipidemia, unspecified: Secondary | ICD-10-CM

## 2021-10-22 DIAGNOSIS — E1122 Type 2 diabetes mellitus with diabetic chronic kidney disease: Secondary | ICD-10-CM

## 2021-10-22 DIAGNOSIS — I5032 Chronic diastolic (congestive) heart failure: Secondary | ICD-10-CM | POA: Diagnosis not present

## 2021-10-22 DIAGNOSIS — I10 Essential (primary) hypertension: Secondary | ICD-10-CM | POA: Diagnosis not present

## 2021-10-22 DIAGNOSIS — N183 Chronic kidney disease, stage 3 unspecified: Secondary | ICD-10-CM

## 2021-10-22 DIAGNOSIS — R4189 Other symptoms and signs involving cognitive functions and awareness: Secondary | ICD-10-CM

## 2021-10-22 NOTE — Progress Notes (Signed)
?Location:   [facility] [place]  ?Nursing Home Room Number: 530 ?Place of Service:  ALF (13) ?Provider:  Veleta Miners MD  ? ?Mast, Man X, NP ? ?Patient Care Team: ?Mast, Man X, NP as PCP - General (Internal Medicine) ?[place] ?Mast, Man X, NP as Nurse Practitioner (Nurse Practitioner) ?Jacelyn Pi, MD as Consulting Physician (Endocrinology) ?Calvert Cantor, MD as Consulting Physician (Ophthalmology) ?Vickey Huger, MD as Consulting Physician (Orthopedic Surgery) ?Suella Broad, MD as Consulting Physician (Physical Medicine and Rehabilitation) ?Erline Levine, MD as Consulting Physician (Neurosurgery) ?Juanita Craver, MD as Consulting Physician (Gastroenterology) ?Martinique, Peter M, MD as Consulting Physician (Cardiology) ?Megan Salon, MD as Consulting Physician (Gynecology) ? ?Extended Emergency Contact Information ?Primary Emergency Contact: Meryl Dare ?[place] ?Mobile Phone: [phone] ?Relation: Niece ?Secondary Emergency Contact: Edmon Crape ?Mobile Phone: [phone] ?Relation: Other ?Preferred language: English ?Interpreter needed? No ?Guardian: Meryl Dare ?Mobile Phone: [phone] ?Relation: Legal Guardian ?Preferred language: English ?Interpreter needed? No ? ?Code Status:  DNR ?Goals of care: Advanced Directive information ? ?  10/22/2021  ? 12:07 PM  ?Advanced Directives  ?Does Patient Have a Medical Advance Directive? Yes  ?Type of Advance Directive Out of facility DNR (pink MOST or yellow form);[place];Living will  ?Does patient want to make changes to medical advance directive? No - Patient declined  ?Copy of [place] in Chart? Yes - validated most recent copy scanned in chart (See row information)  ?Pre-existing out of facility DNR order (yellow form or pink MOST form) Yellow form placed in chart (order not valid for inpatient use)  ? ? ? ?Chief Complaint  ?Patient presents with  ? Medical Management of Chronic  Issues  ? Quality Metric Gaps  ?  Verified [facility] and [facility] patient is due for: ?Zoster Vaccines- Shingrix (1 of 2)   ?FOOT EXAM (Yearly) ?OPHTHALMOLOGY EXAM (Yearly) ?URINE MICROALBUMIN (Yearly) ?HEMOGLOBIN A1C   ? ? ?HPI:  ?Pt is a 88 y.o. female seen today for medical management of chronic diseases.   ? ?Patient has h/o Hypertension, Diabetes Mellitus Type 2 , and Cognitive impairnement. ?She also has h/o NSTEMI in 12/19 managed Conservatively with EF of 55% ?Also has h/o Allergic Dermatitis ?Hyperparathyroidism has had Work up before ? ?  ? ?She is stable. No new Nursing issues. No Behavior issues ?Her weight is stable ?Walks with her walker ?No Falls ?Wt Readings from Last 3 Encounters:  ?10/22/21 118 lb 3.2 oz (53.6 kg)  ?07/26/21 116 lb 12.8 oz (53 kg)  ?05/29/21 117 lb 9.6 oz (53.3 kg)  ? ? ?Past Medical History:  ?Diagnosis Date  ? Contact dermatitis and other eczema, due to unspecified cause   ? Cortical senile cataract   ? GERD (gastroesophageal reflux disease) 02/17/2013  ? Hyperparathyroidism, unspecified ([place]) 03/26/2010  ? Irritable bowel syndrome   ? Keratoconjunctivitis sicca, not specified as Sjogren's   ? Loss of weight   ? Macular degeneration (senile) of retina, unspecified   ? Memory loss   ? Other and unspecified hyperlipidemia   ? Other malaise and fatigue 06/17/2010  ? Pain in joint, site unspecified   ? Pathologic fracture of vertebrae   ? Reflux esophagitis   ? Scoliosis (and kyphoscoliosis), idiopathic   ? Senile osteoporosis   ? Type II or unspecified type diabetes mellitus without mention of complication, uncontrolled   ? Unspecified essential hypertension   ? Unspecified hereditary and idiopathic peripheral neuropathy   ? Unspecified vitamin D deficiency   ? ?  Past Surgical History:  ?Procedure Laterality Date  ? APPENDECTOMY    ? BREAST LUMPECTOMY  02/05/2010  ? Dr. Gershon Mussel Cornett  ? CATARACT EXTRACTION W/ INTRAOCULAR LENS  IMPLANT, BILATERAL  04/2013  ? Dr. Bing Plume  ? CHOLECYSTECTOMY  2004  ?  Dr. Zella Richer  ? COLONOSCOPY  2001  ? Dr. Collene Mares  ? fibroidectomy  1950  ? [place]  ? KYPHOPLASTY  2010  ? T10 & T12 Dr. Erline Levine  ? MEDIAL PARTIAL KNEE REPLACEMENT Left 2004  ? Dr. Ronnie Derby  ? SPINE SURGERY    ? vertebroplasty T10, T12  ? SPINE SURGERY  11/02/2001   ? C4-5 & C5-6 Titanium Plate Placed Dr. Carloyn Manner  ? THYROIDECTOMY, PARTIAL Left 2007  ? TOTAL KNEE ARTHROPLASTY Right 2009  ? Dr. Alvan Dame   ? ? ?Allergies  ?Allergen Reactions  ? Bactrim   ?  unknown  ? Betadine [Povidone Iodine]   ?  unknown  ? Metformin And Related   ?  Hurt stomach  ? Nutmeg Oil (Myristica Oil)   ?  unknown  ? Sulfa Antibiotics   ?  unknown  ? ? ?Allergies as of 10/22/2021   ? ?   Reactions  ? Bactrim   ? unknown  ? Betadine [povidone Iodine]   ? unknown  ? Metformin And Related   ? Hurt stomach  ? Nutmeg Oil (myristica Oil)   ? unknown  ? Sulfa Antibiotics   ? unknown  ? ?  ? ?  ?Medication List  ?  ? ?  ? Accurate as of October 22, 2021 12:08 PM. If you have any questions, ask your nurse or doctor.  ?  ?  ? ?  ? ?acetaminophen 500 MG tablet ?Commonly known as: TYLENOL ?Take 1,000 mg by mouth 2 (two) times daily as needed. ?  ?amLODipine 5 MG tablet ?Commonly known as: NORVASC ?Take 5 mg by mouth daily. ?  ?aspirin 81 MG EC tablet ?Take 1 tablet (81 mg total) by mouth daily. ?  ?betamethasone dipropionate 0.05 % ointment ?Commonly known as: DIPROLENE ?Apply topically. Apply BID PRN to active rash on extremities . 1 app immediately after shower. ?Twice A Day - PRN ?  ?diclofenac Sodium 1 % Gel ?Commonly known as: VOLTAREN ?Apply 2 g topically 2 (two) times daily as needed (knee pain). Apply to both knees ?  ?furosemide 20 MG tablet ?Commonly known as: LASIX ?Take 20 mg by mouth. Once A Day on Mon, Wed, Fri ?  ?losartan 50 MG tablet ?Commonly known as: Cozaar ?Take 1 tablet (50 mg total) by mouth daily. ?  ?metFORMIN 500 MG 24 hr tablet ?Commonly known as: GLUCOPHAGE-XR ?Take 250 mg by mouth 2 (two) times daily. Take 1/2 tablet ?   ?metoprolol tartrate 25 MG tablet ?Commonly known as: LOPRESSOR ?Take 12.5 mg by mouth 2 (two) times daily. Hold Metoprolol for SBP < 110 and DSP <50 ?  ?multivitamins ther. w/minerals Tabs tablet ?Take 1 tablet by mouth daily. 400 mcg tablet ?  ?nitroGLYCERIN 0.4 MG SL tablet ?Commonly known as: NITROSTAT ?Place 0.4 mg under the tongue every 5 (five) minutes as needed for chest pain. PLACE 0.4 MG UNDER TONGUE EVERY 5 MINUTES X3 FOR CHEST PAIN, IF ?NO RELIEF, CALL MD OR SEND TO ED FOR FURTHER EVALUATION. ?  ?potassium chloride 10 MEQ CR capsule ?Commonly known as: MICRO-K ?Take 10 mEq by mouth. Once A Day on Mon, Wed, Fri ?  ? ?  ? ? ?Review  of Systems  ?Constitutional:  Negative for activity change and appetite change.  ?HENT: Negative.    ?Respiratory:  Negative for cough and shortness of breath.   ?Cardiovascular:  Negative for leg swelling.  ?Gastrointestinal:  Negative for constipation.  ?Genitourinary: Negative.   ?Musculoskeletal:  Negative for arthralgias, gait problem and myalgias.  ?Skin: Negative.   ?Neurological:  Negative for dizziness and weakness.  ?Psychiatric/Behavioral:  Positive for confusion. Negative for dysphoric mood and sleep disturbance.   ? ?Immunization History  ?Administered Date(s) Administered  ? Influenza Whole 03/30/2012, 04/13/2013, 04/04/2018  ? Influenza, High Dose Seasonal PF 04/02/2019  ? Influenza-Unspecified 04/17/2014, 03/29/2015, 04/18/2021  ? Moderna SARS-COV2 Booster Vaccination 05/08/2020  ? Moderna Sars-Covid-2 Vaccination 07/02/2019, 07/30/2019, 11/27/2020  ? PFIZER Comirnaty(Gray Top)Covid-19 Tri-Sucrose Vaccine 03/19/2021  ? Pneumococcal Conjugate-13 12/08/2017  ? Pneumococcal Polysaccharide-23 06/30/2001  ? Td 06/30/2001, 12/08/2017  ? ?Pertinent  Health Maintenance Due  ?Topic Date Due  ? FOOT EXAM  12/21/2015  ? OPHTHALMOLOGY EXAM  04/02/2016  ? URINE MICROALBUMIN  02/11/2017  ? HEMOGLOBIN A1C  12/06/2021  ? INFLUENZA VACCINE  01/28/2022  ? DEXA SCAN  Completed   ? ? ?  11/11/2020  ?  8:26 PM 11/12/2020  ?  2:00 AM 11/12/2020  ?  7:45 AM 11/13/2020  ?  2:00 AM 11/13/2020  ? 10:00 AM  ?Fall Risk  ?Patient Fall Risk Level Low fall risk Moderate fall risk Moderate fall ri

## 2021-10-24 LAB — TSH: TSH: 0.71 (ref 0.41–5.90)

## 2021-10-24 LAB — COMPREHENSIVE METABOLIC PANEL
Albumin: 4 (ref 3.5–5.0)
Calcium: 10.8 — AB (ref 8.7–10.7)
Globulin: 2.4
eGFR: 52

## 2021-10-24 LAB — CBC AND DIFFERENTIAL
HCT: 40 (ref 36–46)
Hemoglobin: 13 (ref 12.0–16.0)
Neutrophils Absolute: 5002
Platelets: 290 10*3/uL (ref 150–400)
WBC: 9.6

## 2021-10-24 LAB — CBC: RBC: 4.29 (ref 3.87–5.11)

## 2021-10-24 LAB — BASIC METABOLIC PANEL
BUN: 20 (ref 4–21)
CO2: 30 — AB (ref 13–22)
Chloride: 103 (ref 99–108)
Creatinine: 1 (ref 0.5–1.1)
Glucose: 127
Potassium: 4.4 mEq/L (ref 3.5–5.1)
Sodium: 139 (ref 137–147)

## 2021-10-24 LAB — HEPATIC FUNCTION PANEL
ALT: 13 U/L (ref 7–35)
AST: 19 (ref 13–35)
Alkaline Phosphatase: 60 (ref 25–125)
Bilirubin, Total: 0.6

## 2021-10-24 LAB — HEMOGLOBIN A1C: Hemoglobin A1C: 8.3

## 2021-10-29 ENCOUNTER — Encounter: Payer: Self-pay | Admitting: Internal Medicine

## 2021-11-05 ENCOUNTER — Emergency Department (HOSPITAL_COMMUNITY): Payer: Medicare Other

## 2021-11-05 ENCOUNTER — Emergency Department (HOSPITAL_COMMUNITY)
Admission: EM | Admit: 2021-11-05 | Discharge: 2021-11-06 | Disposition: A | Discharge status: Home / Self Care | Payer: Medicare Other | Attending: Emergency Medicine | Admitting: Emergency Medicine

## 2021-11-05 ENCOUNTER — Encounter (HOSPITAL_COMMUNITY): Payer: Self-pay | Admitting: Emergency Medicine

## 2021-11-05 DIAGNOSIS — Z7982 Long term (current) use of aspirin: Secondary | ICD-10-CM | POA: Insufficient documentation

## 2021-11-05 DIAGNOSIS — Z7984 Long term (current) use of oral hypoglycemic drugs: Secondary | ICD-10-CM | POA: Insufficient documentation

## 2021-11-05 DIAGNOSIS — S0232XA Fracture of orbital floor, left side, initial encounter for closed fracture: Secondary | ICD-10-CM

## 2021-11-05 DIAGNOSIS — S42292A Other displaced fracture of upper end of left humerus, initial encounter for closed fracture: Secondary | ICD-10-CM

## 2021-11-05 DIAGNOSIS — S0512XA Contusion of eyeball and orbital tissues, left eye, initial encounter: Secondary | ICD-10-CM | POA: Diagnosis not present

## 2021-11-05 DIAGNOSIS — Z79899 Other long term (current) drug therapy: Secondary | ICD-10-CM | POA: Diagnosis not present

## 2021-11-05 DIAGNOSIS — W19XXXA Unspecified fall, initial encounter: Secondary | ICD-10-CM | POA: Diagnosis not present

## 2021-11-05 DIAGNOSIS — R519 Headache, unspecified: Secondary | ICD-10-CM | POA: Diagnosis not present

## 2021-11-05 DIAGNOSIS — M25522 Pain in left elbow: Secondary | ICD-10-CM | POA: Diagnosis present

## 2021-11-05 DIAGNOSIS — M25512 Pain in left shoulder: Secondary | ICD-10-CM | POA: Insufficient documentation

## 2021-11-05 DIAGNOSIS — S02401A Maxillary fracture, unspecified, initial encounter for closed fracture: Secondary | ICD-10-CM

## 2021-11-05 DIAGNOSIS — S51012A Laceration without foreign body of left elbow, initial encounter: Secondary | ICD-10-CM | POA: Diagnosis not present

## 2021-11-05 LAB — CBC WITH DIFFERENTIAL/PLATELET
Abs Immature Granulocytes: 0.08 10*3/uL — ABNORMAL HIGH (ref 0.00–0.07)
Basophils Absolute: 0.1 10*3/uL (ref 0.0–0.1)
Basophils Relative: 0 %
Eosinophils Absolute: 0.4 10*3/uL (ref 0.0–0.5)
Eosinophils Relative: 3 %
HCT: 32.8 % — ABNORMAL LOW (ref 36.0–46.0)
Hemoglobin: 11 g/dL — ABNORMAL LOW (ref 12.0–15.0)
Immature Granulocytes: 1 %
Lymphocytes Relative: 13 %
Lymphs Abs: 2.1 10*3/uL (ref 0.7–4.0)
MCH: 30.7 pg (ref 26.0–34.0)
MCHC: 33.5 g/dL (ref 30.0–36.0)
MCV: 91.6 fL (ref 80.0–100.0)
Monocytes Absolute: 1 10*3/uL (ref 0.1–1.0)
Monocytes Relative: 6 %
Neutro Abs: 12.2 10*3/uL — ABNORMAL HIGH (ref 1.7–7.7)
Neutrophils Relative %: 77 %
Platelets: 233 10*3/uL (ref 150–400)
RBC: 3.58 MIL/uL — ABNORMAL LOW (ref 3.87–5.11)
RDW: 13.5 % (ref 11.5–15.5)
WBC: 15.7 10*3/uL — ABNORMAL HIGH (ref 4.0–10.5)
nRBC: 0 % (ref 0.0–0.2)

## 2021-11-05 LAB — COMPREHENSIVE METABOLIC PANEL
ALT: 19 U/L (ref 0–44)
AST: 27 U/L (ref 15–41)
Albumin: 3.5 g/dL (ref 3.5–5.0)
Alkaline Phosphatase: 52 U/L (ref 38–126)
Anion gap: 8 (ref 5–15)
BUN: 20 mg/dL (ref 8–23)
CO2: 24 mmol/L (ref 22–32)
Calcium: 10.7 mg/dL — ABNORMAL HIGH (ref 8.9–10.3)
Chloride: 104 mmol/L (ref 98–111)
Creatinine, Ser: 1.24 mg/dL — ABNORMAL HIGH (ref 0.44–1.00)
GFR, Estimated: 40 mL/min — ABNORMAL LOW (ref >60)
Glucose, Bld: 296 mg/dL — ABNORMAL HIGH (ref 70–99)
Potassium: 4.8 mmol/L (ref 3.5–5.1)
Sodium: 136 mmol/L (ref 135–145)
Total Bilirubin: 0.8 mg/dL (ref 0.3–1.2)
Total Protein: 6 g/dL — ABNORMAL LOW (ref 6.5–8.1)

## 2021-11-05 LAB — TROPONIN I (HIGH SENSITIVITY): Troponin I (High Sensitivity): 6 ng/L

## 2021-11-05 MED ORDER — SODIUM CHLORIDE 0.9 % IV BOLUS
500.0000 mL | Freq: Once | INTRAVENOUS | Status: AC
  Administered 2021-11-05: 500 mL via INTRAVENOUS

## 2021-11-05 MED ORDER — AMOXICILLIN-POT CLAVULANATE 875-125 MG PO TABS
1.0000 | ORAL_TABLET | Freq: Once | ORAL | Status: AC
  Administered 2021-11-05: 1 via ORAL
  Filled 2021-11-05: qty 1

## 2021-11-05 MED ORDER — AMOXICILLIN-POT CLAVULANATE 875-125 MG PO TABS: 1.0000 | ORAL_TABLET | Freq: Two times a day (BID) | ORAL | 0 refills | Status: DC

## 2021-11-05 MED ORDER — OXYCODONE-ACETAMINOPHEN 5-325 MG PO TABS: 1.0000 | ORAL_TABLET | Freq: Four times a day (QID) | ORAL | 0 refills | Status: DC | PRN

## 2021-11-05 MED ORDER — FENTANYL CITRATE PF 50 MCG/ML IJ SOSY
25.0000 ug | PREFILLED_SYRINGE | Freq: Once | INTRAMUSCULAR | Status: AC
Start: 2021-11-05 — End: 2021-11-05
  Administered 2021-11-05: 25 ug via INTRAVENOUS
  Filled 2021-11-05: qty 1

## 2021-11-05 NOTE — ED Notes (Signed)
Patient to imaging.

## 2021-11-05 NOTE — Discharge Instructions (Signed)
You have maxillary sinus and orbital fracture.  Please take Augmentin to prevent infection.  Please avoid sneezing too hard.  You need to follow-up with ENT doctor ? ?You also have left humerus fracture.  Please wear the sling and follow-up with orthopedic doctor. ? ?Please take Augmentin twice daily for a week. ? ?You will be in significant pain so please take Percocet 1 tablet every 6 hours as needed. ? ?Return to ER if you have severe pain, trouble breathing, headache, vomiting. ?

## 2021-11-05 NOTE — ED Provider Notes (Signed)
?[facility] ?Provider Note ? ? ?CSN: [id number] ?Arrival date & time: 11/05/21  1928 ? ?  ? ?History ? ?Chief Complaint  ?Patient presents with  ? Fall  ? ? ?[name] is a 88 y.o. female here presenting with fall.  [facility]  She is not sure how she fell but was noted to have hematoma around the left and also complains of left shoulder pain.  Denies any chest pain or shortness of breath.  ? ?The history is provided by the patient.  ? ?  ? ?Home Medications ?Prior to Admission medications   ?Medication Sig Start Date End Date Taking? Authorizing Provider  ?acetaminophen (TYLENOL) 500 MG tablet Take 1,000 mg by mouth 2 (two) times daily as needed.    [provider]  ?amLODipine (NORVASC) 5 MG tablet Take 5 mg by mouth daily.     [provider]  ?aspirin EC 81 MG EC tablet Take 1 tablet (81 mg total) by mouth daily. 06/29/18   Dixie Dials, MD  ?atorvastatin (LIPITOR) 10 MG tablet Take 10 mg by mouth daily.    [provider]  ?betamethasone dipropionate (DIPROLENE) 0.05 % ointment Apply topically. Apply BID PRN to active rash on extremities . 1 app immediately after shower. ?Twice A Day - PRN    [provider]  ?diclofenac Sodium (VOLTAREN) 1 % GEL Apply 2 g topically 2 (two) times daily as needed (knee pain). Apply to both knees    [provider]  ?furosemide (LASIX) 20 MG tablet Take 20 mg by mouth. Once A Day on Mon, Wed, Fri    [provider]  ?losartan (COZAAR) 50 MG tablet Take 1 tablet (50 mg total) by mouth daily. 11/13/20 10/22/21  Mercy Riding, MD  ?metFORMIN (GLUCOPHAGE-XR) 500 MG 24 hr tablet Take 500 mg by mouth 2 (two) times daily. Take 1/2 tablet    [provider]  ?metoprolol tartrate (LOPRESSOR) 25 MG tablet Take 12.5 mg by mouth 2 (two) times daily. Hold Metoprolol for SBP < 110 and DSP <50    [provider]  ?Multiple Vitamins-Minerals (MULTIVITAMINS THER.  W/MINERALS) TABS tablet Take 1 tablet by mouth daily. 400 mcg tablet    [provider]  ?nitroGLYCERIN (NITROSTAT) 0.4 MG SL tablet Place 0.4 mg under the tongue every 5 (five) minutes as needed for chest pain. PLACE 0.4 MG UNDER TONGUE EVERY 5 MINUTES X3 FOR CHEST PAIN, IF ?NO RELIEF, CALL MD OR SEND TO ED FOR FURTHER EVALUATION.    [provider]  ?potassium chloride (MICRO-K) 10 MEQ CR capsule Take 10 mEq by mouth. Once A Day on Mon, Wed, Fri    [provider]  ?   ? ?Allergies    ?Bactrim, Betadine [povidone iodine], Metformin and related, Nutmeg oil (myristica oil), and Sulfa antibiotics   ? ?Review of Systems   ?Review of Systems  ?Musculoskeletal:   ?     Left shoulder pain  ?All other systems reviewed and are negative. ? ?Physical Exam ?Updated Vital Signs ?BP (!) 154/67   Pulse 74   Temp 98 ?F (36.7 ?C) (Oral)   Resp 18   Ht '5\' 6"'$  (1.676 m)   Wt 52.2 kg   SpO2 96%   BMI 18.56 kg/m?  ?Physical Exam ?Vitals and nursing note reviewed.  ?Constitutional:   ?   Comments: Uncomfortable  ?HENT:  ?   Head: Normocephalic.  ?   Comments: Ecchymosis  below the left orbit.  Patient's extraocular movements are intact ?   Nose: Nose normal.  ?   Mouth/Throat:  ?   Mouth: Mucous membranes are moist.  ?Cardiovascular:  ?   Rate and Rhythm: Normal rate and regular rhythm.  ?   Pulses: Normal pulses.  ?   Heart sounds: Normal heart sounds.  ?Pulmonary:  ?   Effort: Pulmonary effort is normal.  ?   Breath sounds: Normal breath sounds.  ?Abdominal:  ?   General: Abdomen is flat.  ?   Palpations: Abdomen is soft.  ?Musculoskeletal:  ?   Cervical back: Normal range of motion and neck supple.  ?   Comments: Tenderness on the proximal humerus. Skin tear L elbow. No midline spinal tenderness. Nl ROM bilateral hips, no other extremity trauma   ?Neurological:  ?   General: No focal deficit present.  ?   Mental Status: She is alert and oriented to person, place, and time.  ?Psychiatric:     ?   Mood  and Affect: Mood normal.     ?   Behavior: Behavior normal.  ? ? ?ED Results / Procedures / Treatments   ?Labs ?(all labs ordered are listed, but only abnormal results are displayed) ?Labs Reviewed  ?CBC WITH DIFFERENTIAL/PLATELET  ?COMPREHENSIVE METABOLIC PANEL  ?TROPONIN I (HIGH SENSITIVITY)  ? ? ?EKG ?EKG Interpretation ? ?Date/Time:  Tuesday Nov 05 2021 19:47:09 EDT ?Ventricular Rate:  73 ?PR Interval:  162 ?QRS Duration: 95 ?QT Interval:  381 ?QTC Calculation: 420 ?R Axis:   27 ?Text Interpretation: Sinus rhythm No significant change since last tracing Confirmed by Wandra Arthurs [phone]) on 11/05/2021 8:15:48 PM ? ?Radiology ?DG Pelvis 1-2 Views ? ?Result Date: 11/05/2021 ?CLINICAL DATA:  Fall, pelvic injury EXAM: PELVIS - 1-2 VIEW COMPARISON:  None Available. FINDINGS: Normal alignment. No acute fracture or dislocation. Moderate bilateral degenerative hip arthritis with joint space narrowing noted. Densely calcified fibroid noted within the pelvis. Soft tissues are otherwise unremarkable. IMPRESSION: No acute fracture or dislocation. Electronically Signed   By: Fidela Salisbury M.D.   On: 11/05/2021 20:42  ? ?CT HEAD WO CONTRAST (5MM) ? ?Result Date: 11/05/2021 ?CLINICAL DATA:  Head trauma, intracranial venous injury suspected, fall EXAM: CT HEAD WITHOUT CONTRAST TECHNIQUE: Contiguous axial images were obtained from the base of the skull through the vertex without intravenous contrast. RADIATION DOSE REDUCTION: This exam was performed according to the departmental dose-optimization program which includes automated exposure control, adjustment of the mA and/or kV according to patient size and/or use of iterative reconstruction technique. COMPARISON:  None Available. FINDINGS: Brain: There is atrophy and chronic small vessel disease changes. No acute intracranial abnormality. Specifically, no hemorrhage, hydrocephalus, mass lesion, acute infarction, or significant intracranial injury. Vascular: No hyperdense vessel or  unexpected calcification. Skull: No acute calvarial abnormality. Sinuses/Orbits: Fractures through the anterior wall of the left maxillary sinus and floor of the left orbit. Left lateral orbital wall fracture. Fracture through the posterior wall of the left maxillary sinus. Blood seen within the left maxillary sinus. Other: None IMPRESSION: No acute intracranial abnormality. Atrophy, chronic small vessel disease. Fractures through the anterior and posterior wall of the maxillary sinus. Fractures through the lateral wall and floor the left orbit. Electronically Signed   By: Rolm Baptise M.D.   On: 11/05/2021 20:23  ? ?CT Cervical Spine Wo Contrast ? ?Result Date: 11/05/2021 ?CLINICAL DATA:  Neck trauma (Age >= 65y).  Fall. EXAM: CT CERVICAL SPINE WITHOUT CONTRAST TECHNIQUE: Multidetector  CT imaging of the cervical spine was performed without intravenous contrast. Multiplanar CT image reconstructions were also generated. RADIATION DOSE REDUCTION: This exam was performed according to the departmental dose-optimization program which includes automated exposure control, adjustment of the mA and/or kV according to patient size and/or use of iterative reconstruction technique. COMPARISON:  None Available. FINDINGS: Alignment: Grade 1 degenerative anterolisthesis of C6 on C7 and C7 on T1. Skull base and vertebrae: No acute fracture. No primary bone lesion or focal pathologic process. Soft tissues and spinal canal: Choose 1 Disc levels: Prior anterior fusion from C4-C6. Advanced degenerative disc disease at C6-7 and C2-3. Bilateral moderate degenerative facet disease. Upper chest: Biapical scarring. Other: None IMPRESSION: Degenerative disc and facet disease. Prior ACDF C4-C6. No acute bony abnormality. Electronically Signed   By: Rolm Baptise M.D.   On: 11/05/2021 20:27  ? ?DG Humerus Left ? ?Result Date: 11/05/2021 ?CLINICAL DATA:  Fall, left arm pain EXAM: LEFT HUMERUS - 2+ VIEW COMPARISON:  None Available. FINDINGS: There is  an acute fracture involving the greater tuberosity of the left humeral head which appears posterolaterally displaced. There is a subtle lucency extending across the surgical neck of the humerus and a nondisplaced

## 2021-11-05 NOTE — Progress Notes (Signed)
Orthopedic Tech Progress Note ?Patient Details:  ?[name] ?Oct 18, 1925 ?[id number] ? ?Ortho Devices ?Type of Ortho Device: Sling immobilizer ?Ortho Device/Splint Location: lue ?Ortho Device/Splint Interventions: Ordered, Application, Adjustment ?  ?Post Interventions ?Patient Tolerated: Well ? ?Edwina Barth ?11/05/2021, 9:08 PM ? ?

## 2021-11-05 NOTE — ED Notes (Signed)
Report given to Matt Holmes at [facility]  ?

## 2021-11-05 NOTE — ED Notes (Signed)
[facility] contacted 

## 2021-11-05 NOTE — ED Notes (Signed)
MD Yao at bedside 

## 2021-11-05 NOTE — ED Notes (Signed)
Patient assisted on and off bedpan.

## 2021-11-05 NOTE — ED Notes (Signed)
Patient's guardian given update over the phone.  ?

## 2021-11-05 NOTE — ED Triage Notes (Signed)
Patient BIB [facility] from [facility] c/o a fall this evening.  Patient reports she's unsure how she fell, and does not remember how she fell.  Patient c/o left shoulder and arm pain.  Patient denies back and neck pain. ?

## 2021-11-06 ENCOUNTER — Non-Acute Institutional Stay: Payer: Medicare Other | Admitting: Orthopedic Surgery

## 2021-11-06 ENCOUNTER — Encounter: Payer: Self-pay | Admitting: Orthopedic Surgery

## 2021-11-06 ENCOUNTER — Other Ambulatory Visit: Payer: Self-pay | Admitting: Orthopedic Surgery

## 2021-11-06 DIAGNOSIS — S42209G Unspecified fracture of upper end of unspecified humerus, subsequent encounter for fracture with delayed healing: Secondary | ICD-10-CM | POA: Diagnosis not present

## 2021-11-06 DIAGNOSIS — S0232XD Fracture of orbital floor, left side, subsequent encounter for fracture with routine healing: Secondary | ICD-10-CM

## 2021-11-06 DIAGNOSIS — S02401D Maxillary fracture, unspecified, subsequent encounter for fracture with routine healing: Secondary | ICD-10-CM

## 2021-11-06 DIAGNOSIS — E1122 Type 2 diabetes mellitus with diabetic chronic kidney disease: Secondary | ICD-10-CM

## 2021-11-06 DIAGNOSIS — K5903 Drug induced constipation: Secondary | ICD-10-CM

## 2021-11-06 DIAGNOSIS — I10 Essential (primary) hypertension: Secondary | ICD-10-CM

## 2021-11-06 DIAGNOSIS — N183 Chronic kidney disease, stage 3 unspecified: Secondary | ICD-10-CM

## 2021-11-06 DIAGNOSIS — R4189 Other symptoms and signs involving cognitive functions and awareness: Secondary | ICD-10-CM

## 2021-11-06 DIAGNOSIS — I5032 Chronic diastolic (congestive) heart failure: Secondary | ICD-10-CM | POA: Diagnosis not present

## 2021-11-06 DIAGNOSIS — E1142 Type 2 diabetes mellitus with diabetic polyneuropathy: Secondary | ICD-10-CM

## 2021-11-06 MED ORDER — SENNA 8.6 MG PO CAPS: 2.0000 | ORAL_CAPSULE | Freq: Every evening | ORAL | 0 refills | Status: DC

## 2021-11-06 NOTE — Progress Notes (Signed)
?Location:  [facility] ?Nursing Home Room Number: [id number] ?Place of Service:  ALF (13) ?Provider: Yvonna Alanis, NP ? ? ?Patient Care Team: ?Mast, Man X, NP as PCP - General (Internal Medicine) ?[place] ?Mast, Man X, NP as Nurse Practitioner (Nurse Practitioner) ?Jacelyn Pi, MD as Consulting Physician (Endocrinology) ?Calvert Cantor, MD as Consulting Physician (Ophthalmology) ?Vickey Huger, MD as Consulting Physician (Orthopedic Surgery) ?Suella Broad, MD as Consulting Physician (Physical Medicine and Rehabilitation) ?Erline Levine, MD as Consulting Physician (Neurosurgery) ?Juanita Craver, MD as Consulting Physician (Gastroenterology) ?Martinique, Peter M, MD as Consulting Physician (Cardiology) ?Megan Salon, MD as Consulting Physician (Gynecology) ? ?Extended Emergency Contact Information ?Primary Emergency Contact: Meryl Dare ?[place] ?Mobile Phone: [phone] ?Relation: Niece ?Secondary Emergency Contact: Edmon Crape ?Mobile Phone: [phone] ?Relation: Other ?Preferred language: English ?Interpreter needed? No ?Guardian: Meryl Dare ?Mobile Phone: [phone] ?Relation: Legal Guardian ?Preferred language: English ?Interpreter needed? No ? ?Code Status:  DNR ?Goals of care: Advanced Directive information ? ?  11/06/2021  ?  1:02 PM  ?Advanced Directives  ?Does Patient Have a Medical Advance Directive? Yes  ?Type of Advance Directive Out of facility DNR (pink MOST or yellow form);[place];Living will  ?Does patient want to make changes to medical advance directive? No - Patient declined  ?Copy of [place] in Chart? Yes - validated most recent copy scanned in chart (See row information)  ?Pre-existing out of facility DNR order (yellow form or pink MOST form) Yellow form placed in chart (order not valid for inpatient use)  ? ? ? ?Chief Complaint  ?Patient presents with  ? Hospitalization Follow-up  ?  Hospital follow up   ? Quality Metric Gaps  ?  Discuss the need for Shingrix vaccine and eye exam or post pone if patient refuses.   ? ? ?HPI:  ?Pt is a 88 y.o. Howard seen today s/p f/u ED visit 05/09-05/10.  ? ?05/09 she was found by nursing staff laying flat on the floor, head down and partially under her bed. She c/o left shoulder pain and had a hematoma to her head. She was sent to the ED for further evaluation. CT head negative for intracranial abnormality, she was noted to have fractures to her anterior & posterior maxillary sinus, fractures to lateral wall and floor of left orbit noted. CT spine negative for acute fracture. Xray left arm revealed fracture to proximal end of humerus. WBC 15.7, other labs unremarkable. She was discharged back to AL with Augmentin and Percocet. Advised to follow up with orthopedics and ENT.  ? ?Today, left arm pain rated 5/10. Pain increased with movement. Left arm remains in sling. She has increased bruising and swelling to left eye. Denies changes to vision or eye pain. Overall, requiring a lot of assistance since returning to AL, plan to admit to SNF later on today.  ? ?CHF- LVEF 65-70% 10/2020, remains on lasix ?HTN- BUN/creat 20/1.24 11/05/2021, remains on losartan, amlodipine and metoprolol ?T2DM- A1c 8.3 10/24/2021, metformin recently increased, remains on carb mod diet ?Cognitive impairment- no behavioral outbursts, was doing well in AL prior to fall ?CKD- BUN/creat 20/1.24 11/05/2021 ?Hypercalcemia- calcium 10.7 11/05/2021, was 10.8, PTH normal, PTHR low, Calcitrol normal  ? ? ?Past Medical History:  ?Diagnosis Date  ? Contact dermatitis and other eczema, due to unspecified cause   ? Cortical senile cataract   ? GERD (gastroesophageal reflux disease) 02/17/2013  ? Hyperparathyroidism, unspecified ([place]) 03/26/2010  ? Irritable bowel syndrome   ?  Keratoconjunctivitis sicca, not specified as Sjogren's   ? Loss of weight   ? Macular degeneration (senile) of retina, unspecified   ? Memory loss    ? Other and unspecified hyperlipidemia   ? Other malaise and fatigue 06/17/2010  ? Pain in joint, site unspecified   ? Pathologic fracture of vertebrae   ? Reflux esophagitis   ? Scoliosis (and kyphoscoliosis), idiopathic   ? Senile osteoporosis   ? Type II or unspecified type diabetes mellitus without mention of complication, uncontrolled   ? Unspecified essential hypertension   ? Unspecified hereditary and idiopathic peripheral neuropathy   ? Unspecified vitamin D deficiency   ? ?Past Surgical History:  ?Procedure Laterality Date  ? APPENDECTOMY    ? BREAST LUMPECTOMY  02/05/2010  ? Dr. Gershon Mussel Cornett  ? CATARACT EXTRACTION W/ INTRAOCULAR LENS  IMPLANT, BILATERAL  04/2013  ? Dr. Bing Plume  ? CHOLECYSTECTOMY  2004  ? Dr. Zella Richer  ? COLONOSCOPY  2001  ? Dr. Collene Mares  ? fibroidectomy  1950  ? [place]  ? KYPHOPLASTY  2010  ? T10 & T12 Dr. Erline Levine  ? MEDIAL PARTIAL KNEE REPLACEMENT Left 2004  ? Dr. Ronnie Derby  ? SPINE SURGERY    ? vertebroplasty T10, T12  ? SPINE SURGERY  11/02/2001   ? C4-5 & C5-6 Titanium Plate Placed Dr. Carloyn Manner  ? THYROIDECTOMY, PARTIAL Left 2007  ? TOTAL KNEE ARTHROPLASTY Right 2009  ? Dr. Alvan Dame   ? ? ?Allergies  ?Allergen Reactions  ? Bactrim   ?  unknown  ? Betadine [Povidone Iodine]   ?  unknown  ? Metformin And Related   ?  Hurt stomach  ? Nutmeg Oil (Myristica Oil)   ?  unknown  ? Sulfa Antibiotics   ?  unknown  ? ? ?Outpatient Encounter Medications as of 11/06/2021  ?Medication Sig  ? acetaminophen (TYLENOL) 325 MG tablet Take 650 mg by mouth every 6 (six) hours as needed for mild pain.  ? amLODipine (NORVASC) 5 MG tablet Take 5 mg by mouth daily.   ? amoxicillin-clavulanate (AUGMENTIN) 875-125 MG tablet Take 1 tablet by mouth 2 (two) times daily. One po bid x 7 days  ? aspirin EC 81 MG EC tablet Take 1 tablet (81 mg total) by mouth daily.  ? atorvastatin (LIPITOR) 10 MG tablet Take 10 mg by mouth daily.  ? betamethasone dipropionate (DIPROLENE) 0.05 % ointment Apply topically. Apply BID PRN  to active rash on extremities . 1 app immediately after shower. ?Twice A Day - PRN  ? diclofenac Sodium (VOLTAREN) 1 % GEL Apply 2 g topically 3 (three) times daily as needed (knee pain). Apply to both knees  ? furosemide (LASIX) 20 MG tablet Take 20 mg by mouth. Once A Day on Mon, Wed, Fri  ? losartan (COZAAR) 50 MG tablet Take 1 tablet (50 mg total) by mouth daily.  ? metFORMIN (GLUCOPHAGE-XR) 500 MG 24 hr tablet Take 500 mg by mouth 2 (two) times daily. Take 1/2 tablet  ? metoprolol tartrate (LOPRESSOR) 25 MG tablet Take 12.5 mg by mouth 2 (two) times daily. Hold Metoprolol for SBP < 110 and DSP <50  ? Multiple Vitamins-Minerals (MULTIVITAMINS THER. W/MINERALS) TABS tablet Take 1 tablet by mouth daily. 400 mcg tablet  ? nitroGLYCERIN (NITROSTAT) 0.4 MG SL tablet Place 0.4 mg under the tongue every 5 (five) minutes as needed for chest pain. PLACE 0.4 MG UNDER TONGUE EVERY 5 MINUTES X3 FOR CHEST PAIN, IF ?NO RELIEF, CALL  MD OR SEND TO ED FOR FURTHER EVALUATION.  ? oxyCODONE-acetaminophen (PERCOCET/ROXICET) 5-325 MG tablet Take 1 tablet by mouth every 6 (six) hours as needed for severe pain.  ? potassium chloride (MICRO-K) 10 MEQ CR capsule Take 10 mEq by mouth. Once A Day on Mon, Wed, Fri  ? saccharomyces boulardii (FLORASTOR) 250 MG capsule Take 250 mg by mouth 2 (two) times daily.  ? [DISCONTINUED] acetaminophen (TYLENOL) 500 MG tablet Take 1,000 mg by mouth 2 (two) times daily as needed.  ? ?No facility-administered encounter medications on file as of 11/06/2021.  ? ? ?Review of Systems  ?Constitutional:  Negative for activity change, appetite change, chills and fever.  ?HENT:  Positive for facial swelling. Negative for congestion, ear pain, sinus pressure and sinus pain.   ?Eyes:  Negative for pain, redness and visual disturbance.  ?Respiratory:  Negative for cough, shortness of breath and wheezing.   ?Cardiovascular:  Negative for chest pain and leg swelling.  ?Gastrointestinal:  Negative for abdominal  distention, abdominal pain, constipation, diarrhea, nausea and vomiting.  ?Genitourinary:  Negative for dysuria, frequency and hematuria.  ?Musculoskeletal:  Positive for gait problem and joint swelling.  ?Sk

## 2021-11-07 ENCOUNTER — Non-Acute Institutional Stay: Payer: Medicare Other | Admitting: Internal Medicine

## 2021-11-07 ENCOUNTER — Encounter: Payer: Self-pay | Admitting: Internal Medicine

## 2021-11-07 DIAGNOSIS — I5032 Chronic diastolic (congestive) heart failure: Secondary | ICD-10-CM

## 2021-11-07 DIAGNOSIS — E785 Hyperlipidemia, unspecified: Secondary | ICD-10-CM

## 2021-11-07 DIAGNOSIS — S0232XD Fracture of orbital floor, left side, subsequent encounter for fracture with routine healing: Secondary | ICD-10-CM | POA: Diagnosis not present

## 2021-11-07 DIAGNOSIS — N183 Chronic kidney disease, stage 3 unspecified: Secondary | ICD-10-CM

## 2021-11-07 DIAGNOSIS — S02401D Maxillary fracture, unspecified, subsequent encounter for fracture with routine healing: Secondary | ICD-10-CM

## 2021-11-07 DIAGNOSIS — S42209G Unspecified fracture of upper end of unspecified humerus, subsequent encounter for fracture with delayed healing: Secondary | ICD-10-CM

## 2021-11-07 DIAGNOSIS — R4189 Other symptoms and signs involving cognitive functions and awareness: Secondary | ICD-10-CM

## 2021-11-07 DIAGNOSIS — E1142 Type 2 diabetes mellitus with diabetic polyneuropathy: Secondary | ICD-10-CM

## 2021-11-07 DIAGNOSIS — I10 Essential (primary) hypertension: Secondary | ICD-10-CM

## 2021-11-07 DIAGNOSIS — F418 Other specified anxiety disorders: Secondary | ICD-10-CM

## 2021-11-07 DIAGNOSIS — E1122 Type 2 diabetes mellitus with diabetic chronic kidney disease: Secondary | ICD-10-CM

## 2021-11-07 NOTE — Progress Notes (Signed)
?Provider:  Veleta Miners MD ?Location:   [facility] [place] ?Nursing Home Room Number: 72 ?Place of Service:  ALF (13) ? ?PCP: Mast, Man X, NP ?Patient Care Team: ?Mast, Man X, NP as PCP - General (Internal Medicine) ?[place] ?Mast, Man X, NP as Nurse Practitioner (Nurse Practitioner) ?Jacelyn Pi, MD as Consulting Physician (Endocrinology) ?Calvert Cantor, MD as Consulting Physician (Ophthalmology) ?Vickey Huger, MD as Consulting Physician (Orthopedic Surgery) ?Suella Broad, MD as Consulting Physician (Physical Medicine and Rehabilitation) ?Erline Levine, MD as Consulting Physician (Neurosurgery) ?Juanita Craver, MD as Consulting Physician (Gastroenterology) ?Martinique, Peter M, MD as Consulting Physician (Cardiology) ?Megan Salon, MD as Consulting Physician (Gynecology) ? ?Extended Emergency Contact Information ?Primary Emergency Contact: Meryl Dare ?[place] ?Mobile Phone: [phone] ?Relation: Niece ?Secondary Emergency Contact: Edmon Crape ?Mobile Phone: [phone] ?Relation: Other ?Preferred language: English ?Interpreter needed? No ?Guardian: Meryl Dare ?Mobile Phone: [phone] ?Relation: Legal Guardian ?Preferred language: English ?Interpreter needed? No ? ?Code Status: DNR ?Goals of Care: Advanced Directive information ? ?  11/07/2021  ?  3:31 PM  ?Advanced Directives  ?Does Patient Have a Medical Advance Directive? Yes  ?Type of Advance Directive Out of facility DNR (pink MOST or yellow form);[place];Living will  ?Does patient want to make changes to medical advance directive? No - Patient declined  ?Copy of Susan Moore in Chart? Yes - validated most recent copy scanned in chart (See row information)  ?Pre-existing out of facility DNR order (yellow form or pink MOST form) Yellow form placed in chart (order not valid for inpatient use)  ? ? ? ? ?Chief Complaint  ?Patient presents with  ? Readmit To SNF  ?  Re  admission to SNF   ? ? ?HPI: Patient is a 88 y.o. female seen today for admission to SNF ? ?Patient has h/o Hypertension, Diabetes Mellitus Type 2 , and Cognitive impairnement. ?She also has h/o NSTEMI in 12/19 managed Conservatively with EF of 55% ?Also has h/o Allergic Dermatitis ?Hyperparathyroidism has had Work up before ? ?Patient lives in [place] and walks with her walker ?Fell in her room.  Denies any dizziness seems like a mechanical fall.  Was sent to the hospital ?Was found to have a left humeral head fracture and also left lateral orbital wall and left floor of orbit fracture ?Was started on Augmentin for sinus infection prevention. ?And now is on shoulder immobilizer ?Patient was unable to do her ADLs in AL and was not transferred to SNF ?Did not have any acute complaints today.  Is needing help with her dressing.  Is not able to walk with her walker ?Denied any pain wants to know when she can go back to her room [place] ? ? ?Past Medical History:  ?Diagnosis Date  ? Contact dermatitis and other eczema, due to unspecified cause   ? Cortical senile cataract   ? GERD (gastroesophageal reflux disease) 02/17/2013  ? Hyperparathyroidism, unspecified ([place]) 03/26/2010  ? Irritable bowel syndrome   ? Keratoconjunctivitis sicca, not specified as Sjogren's   ? Loss of weight   ? Macular degeneration (senile) of retina, unspecified   ? Memory loss   ? Other and unspecified hyperlipidemia   ? Other malaise and fatigue 06/17/2010  ? Pain in joint, site unspecified   ? Pathologic fracture of vertebrae   ? Reflux esophagitis   ? Scoliosis (and kyphoscoliosis), idiopathic   ? Senile osteoporosis   ? Type II or unspecified type diabetes mellitus  without mention of complication, uncontrolled   ? Unspecified essential hypertension   ? Unspecified hereditary and idiopathic peripheral neuropathy   ? Unspecified vitamin D deficiency   ? ?Past Surgical History:  ?Procedure Laterality Date  ? APPENDECTOMY    ? BREAST LUMPECTOMY   02/05/2010  ? Dr. Gershon Mussel Cornett  ? CATARACT EXTRACTION W/ INTRAOCULAR LENS  IMPLANT, BILATERAL  04/2013  ? Dr. Bing Plume  ? CHOLECYSTECTOMY  2004  ? Dr. Zella Richer  ? COLONOSCOPY  2001  ? Dr. Collene Mares  ? fibroidectomy  1950  ? [facility]  ? KYPHOPLASTY  2010  ? T10 & T12 Dr. Erline Levine  ? MEDIAL PARTIAL KNEE REPLACEMENT Left 2004  ? Dr. Ronnie Derby  ? SPINE SURGERY    ? vertebroplasty T10, T12  ? SPINE SURGERY  11/02/2001   ? C4-5 & C5-6 Titanium Plate Placed Dr. Carloyn Manner  ? THYROIDECTOMY, PARTIAL Left 2007  ? TOTAL KNEE ARTHROPLASTY Right 2009  ? Dr. Alvan Dame   ? ? reports that she has never smoked. She has never used smokeless tobacco. She reports that she does not drink alcohol and does not use drugs. ?Social History  ? ?Socioeconomic History  ? Marital status: Widowed  ?  Spouse name: Not on file  ? Number of children: 0  ? Years of education: Not on file  ? Highest education level: Not on file  ?Occupational History  ? Occupation: retired Photographer   ?  Employer: RETIRED  ?Tobacco Use  ? Smoking status: Never  ? Smokeless tobacco: Never  ?Vaping Use  ? Vaping Use: Never used  ?Substance and Sexual Activity  ? Alcohol use: No  ? Drug use: No  ? Sexual activity: Never  ?  Partners: Male  ?  Birth control/protection: Post-menopausal  ?Other Topics Concern  ? Not on file  ?Social History Narrative  ? Lives at [place] since 02/06/2009  ? Widowed   ? No childred  ? Living Will  ? ?Social Determinants of Health  ? ?Financial Resource Strain: Not on file  ?Food Insecurity: Not on file  ?Transportation Needs: Not on file  ?Physical Activity: Not on file  ?Stress: Not on file  ?Social Connections: Not on file  ?Intimate Partner Violence: Not on file  ? ? ?Functional Status Survey: ?  ? ?Family History  ?Problem Relation Age of Onset  ? Diabetes Mother   ? Heart disease Mother   ?     CHF  ? Stroke Father   ? Heart disease Sister   ?     MI  ? Colon cancer Neg Hx   ? Stomach cancer Neg Hx   ? Esophageal  cancer Neg Hx   ? Rectal cancer Neg Hx   ? Liver cancer Neg Hx   ? ? ?Health Maintenance  ?Topic Date Due  ? Zoster Vaccines- Shingrix (1 of 2) Never done  ? OPHTHALMOLOGY EXAM  04/02/2016  ? COVID-19 Vaccine (5 - Booster for Moderna series) 05/29/2022 (Originally 05/14/2021)  ? INFLUENZA VACCINE  01/28/2022  ? HEMOGLOBIN A1C  04/25/2022  ? FOOT EXAM  10/23/2022  ? TETANUS/TDAP  12/09/2027  ? Pneumonia Vaccine 35+ Years old  Completed  ? DEXA SCAN  Completed  ? HPV VACCINES  Aged Out  ? ? ?Allergies  ?Allergen Reactions  ? Bactrim   ?  unknown  ? Betadine [Povidone Iodine]   ?  unknown  ? Metformin And Related   ?  Hurt stomach  ?  Nutmeg Oil (Myristica Oil)   ?  unknown  ? Sulfa Antibiotics   ?  unknown  ? ? ?Allergies as of 11/07/2021   ? ?   Reactions  ? Bactrim   ? unknown  ? Betadine [povidone Iodine]   ? unknown  ? Metformin And Related   ? Hurt stomach  ? Nutmeg Oil (myristica Oil)   ? unknown  ? Sulfa Antibiotics   ? unknown  ? ?  ? ?  ?Medication List  ?  ? ?  ? Accurate as of Nov 07, 2021  3:31 PM. If you have any questions, ask your nurse or doctor.  ?  ?  ? ?  ? ?acetaminophen 325 MG tablet ?Commonly known as: TYLENOL ?Take 650 mg by mouth every 6 (six) hours as needed for mild pain. ?  ?amLODipine 5 MG tablet ?Commonly known as: NORVASC ?Take 5 mg by mouth daily. ?  ?amoxicillin-clavulanate 875-125 MG tablet ?Commonly known as: Augmentin ?Take 1 tablet by mouth 2 (two) times daily. One po bid x 7 days ?  ?aspirin 81 MG EC tablet ?Take 1 tablet (81 mg total) by mouth daily. ?  ?atorvastatin 10 MG tablet ?Commonly known as: LIPITOR ?Take 10 mg by mouth daily. ?  ?betamethasone dipropionate 0.05 % ointment ?Commonly known as: DIPROLENE ?Apply topically. Apply BID PRN to active rash on extremities . 1 app immediately after shower. ?Twice A Day - PRN ?  ?diclofenac Sodium 1 % Gel ?Commonly known as: VOLTAREN ?Apply 2 g topically 3 (three) times daily as needed (knee pain). Apply to both knees ?  ?furosemide 20  MG tablet ?Commonly known as: LASIX ?Take 20 mg by mouth. Once A Day on Mon, Wed, Fri ?  ?losartan 50 MG tablet ?Commonly known as: Cozaar ?Take 1 tablet (50 mg total) by mouth daily. ?  ?metFORMIN 500 MG 24 hr tabl

## 2021-11-12 ENCOUNTER — Other Ambulatory Visit: Payer: Self-pay | Admitting: Internal Medicine

## 2021-11-12 ENCOUNTER — Other Ambulatory Visit: Payer: Self-pay

## 2021-11-12 ENCOUNTER — Other Ambulatory Visit: Payer: Self-pay | Admitting: Adult Health

## 2021-11-12 DIAGNOSIS — S0232XD Fracture of orbital floor, left side, subsequent encounter for fracture with routine healing: Secondary | ICD-10-CM

## 2021-11-12 DIAGNOSIS — S02401D Maxillary fracture, unspecified, subsequent encounter for fracture with routine healing: Secondary | ICD-10-CM

## 2021-11-12 MED ORDER — OXYCODONE-ACETAMINOPHEN 5-325 MG PO TABS: 1.0000 | ORAL_TABLET | Freq: Four times a day (QID) | ORAL | 0 refills | Status: DC | PRN

## 2021-11-12 NOTE — Telephone Encounter (Signed)
Incoming fax received from [facility] for Oxycodone 5-325 , take 1 by mouth every 6 hours as needed for severe pain. This request is for an assisted living resident at [facility]  ? ?Message sent to Bard Herbert who is covering for [facility], however patient seen Dr.Gupta last week so I cc'd her in the request as well.  ?

## 2021-11-12 NOTE — Telephone Encounter (Signed)
Ignore the request. I will take care of it ?

## 2021-11-15 LAB — BASIC METABOLIC PANEL
BUN: 25 — AB (ref 4–21)
CO2: 19 (ref 13–22)
Chloride: 104 (ref 99–108)
Creatinine: 1.1 (ref 0.5–1.1)
Glucose: 126
Potassium: 4.4 mEq/L (ref 3.5–5.1)
Sodium: 136 — AB (ref 137–147)

## 2021-11-15 LAB — CBC AND DIFFERENTIAL
HCT: 29 — AB (ref 36–46)
Hemoglobin: 9.9 — AB (ref 12.0–16.0)
Neutrophils Absolute: 7513
Platelets: 419 10*3/uL — AB (ref 150–400)
WBC: 11.4

## 2021-11-15 LAB — COMPREHENSIVE METABOLIC PANEL: Calcium: 10.3 (ref 8.7–10.7)

## 2021-11-15 LAB — CBC: RBC: 3.19 — AB (ref 3.87–5.11)

## 2021-11-19 ENCOUNTER — Non-Acute Institutional Stay (SKILLED_NURSING_FACILITY): Payer: Medicare Other | Admitting: Internal Medicine

## 2021-11-19 ENCOUNTER — Encounter: Payer: Self-pay | Admitting: Internal Medicine

## 2021-11-19 DIAGNOSIS — S0232XD Fracture of orbital floor, left side, subsequent encounter for fracture with routine healing: Secondary | ICD-10-CM | POA: Diagnosis not present

## 2021-11-19 DIAGNOSIS — S02401D Maxillary fracture, unspecified, subsequent encounter for fracture with routine healing: Secondary | ICD-10-CM | POA: Diagnosis not present

## 2021-11-19 DIAGNOSIS — S42209G Unspecified fracture of upper end of unspecified humerus, subsequent encounter for fracture with delayed healing: Secondary | ICD-10-CM

## 2021-11-19 DIAGNOSIS — R634 Abnormal weight loss: Secondary | ICD-10-CM

## 2021-11-19 NOTE — Progress Notes (Signed)
Location:   [facility] Room Number: 55 Place of Service:  SNF (31) Provider:  Veleta Miners MD  Mast, Man X, NP  Patient Care Team: Mast, Man X, NP as PCP - General (Internal Medicine) [place] Mast, Man X, NP as Nurse Practitioner (Nurse Practitioner) Jacelyn Pi, MD as Consulting Physician (Endocrinology) Calvert Cantor, MD as Consulting Physician (Ophthalmology) Vickey Huger, MD as Consulting Physician (Orthopedic Surgery) Suella Broad, MD as Consulting Physician (Physical Medicine and Rehabilitation) Erline Levine, MD as Consulting Physician (Neurosurgery) Juanita Craver, MD as Consulting Physician (Gastroenterology) Martinique, Peter M, MD as Consulting Physician (Cardiology) Megan Salon, MD as Consulting Physician (Gynecology)  Extended Emergency Contact Information Primary Emergency Contact: Ladene Artist of [employer] Phone: [phone] Relation: Niece Secondary Emergency Contact: Edmon Crape Mobile Phone: [phone] Relation: Other Preferred language: Cleophus Molt Interpreter needed? No Guardian: Meryl Dare Mobile Phone: [phone] Relation: Legal Guardian Preferred language: English Interpreter needed? No  Code Status:  DNR Goals of care: Advanced Directive information    11/19/2021    4:01 PM  Advanced Directives  Does Patient Have a Medical Advance Directive? Yes  Type of Advance Directive Out of facility DNR (pink MOST or yellow form);[place];Living will  Does patient want to make changes to medical advance directive? No - Patient declined  Copy of [place] in Chart? Yes - validated most recent copy scanned in chart (See row information)  Pre-existing out of facility DNR order (yellow form or pink MOST form) Yellow form placed in chart (order not valid for inpatient use)     Chief Complaint  Patient presents with   Acute Visit    Poor Appetite      HPI:  Pt is a 88 y.o. female seen today for an acute visit for Poor Appetite and Pain   Patient lives in [place] and walks with her walker [place] in her room.  Denies any dizziness seems like a mechanical fall.  Was sent to the hospital Was found to have a left humeral head fracture and also left lateral orbital wall and left floor of orbit fracture Was started on Augmentin for sinus infection prevention. And now is on shoulder immobilizer Patient was unable to do her ADLs in [place] and was transferred to SNF  Patient is having difficult time to chew now She says she cannot chew from her left side Nurse have downgraded her to Mechanical soft She has lost some weight and her they are wondering if she needs to be on Remeron again Also Patient c/o Pain in her Shoulder. Mood Is good Sleeping well  Other issues Patient has h/o Hypertension, Diabetes Mellitus Type 2 , and Cognitive impairnement. She also has h/o NSTEMI in 12/19 managed Conservatively with EF of 55% Also has h/o Allergic Dermatitis Hyperparathyroidism has had Work up before Past Medical History:  Diagnosis Date   Contact dermatitis and other eczema, due to unspecified cause    Cortical senile cataract    GERD (gastroesophageal reflux disease) 02/17/2013   Hyperparathyroidism, unspecified ([place]) 03/26/2010   Irritable bowel syndrome    Keratoconjunctivitis sicca, not specified as Sjogren's    Loss of weight    Macular degeneration (senile) of retina, unspecified    Memory loss    Other and unspecified hyperlipidemia    Other malaise and fatigue 06/17/2010   Pain in joint, site unspecified    Pathologic fracture of vertebrae    Reflux esophagitis  Scoliosis (and kyphoscoliosis), idiopathic    Senile osteoporosis    Type II or unspecified type diabetes mellitus without mention of complication, uncontrolled    Unspecified essential hypertension    Unspecified hereditary and idiopathic peripheral neuropathy    Unspecified  vitamin D deficiency    Past Surgical History:  Procedure Laterality Date   APPENDECTOMY     BREAST LUMPECTOMY  02/05/2010   Dr. Gershon Mussel Cornett   CATARACT EXTRACTION W/ INTRAOCULAR LENS  IMPLANT, BILATERAL  04/2013   Dr. Bing Plume   CHOLECYSTECTOMY  2004   Dr. Zella Richer   COLONOSCOPY  2001   Dr. Collene Mares   fibroidectomy  1950   HEMORRHOID SURGERY  1978   KYPHOPLASTY  2010   T10 & T12 Dr. Erline Levine   MEDIAL PARTIAL KNEE REPLACEMENT Left 2004   Dr. Ronnie Derby   SPINE SURGERY     vertebroplasty T10, T12   SPINE SURGERY  11/02/2001    C4-5 & C5-6 Titanium Plate Placed Dr. Carloyn Manner   THYROIDECTOMY, PARTIAL Left 2007   TOTAL KNEE ARTHROPLASTY Right 2009   Dr. Alvan Dame     Allergies  Allergen Reactions   Bactrim     unknown   Betadine [Povidone Iodine]     unknown   Metformin And Related     Hurt stomach   Nutmeg Oil (Myristica Oil)     unknown   Sulfa Antibiotics     unknown    Allergies as of 11/19/2021       Reactions   Bactrim    unknown   Betadine [povidone Iodine]    unknown   Metformin And Related    Hurt stomach   Nutmeg Oil (myristica Oil)    unknown   Sulfa Antibiotics    unknown        Medication List        Accurate as of Nov 19, 2021  4:02 PM. If you have any questions, ask your nurse or doctor.          STOP taking these medications    amoxicillin-clavulanate 875-125 MG tablet Commonly known as: Augmentin Stopped by: Virgie Dad, MD   saccharomyces boulardii 250 MG capsule Commonly known as: FLORASTOR Stopped by: Virgie Dad, MD       TAKE these medications    acetaminophen 325 MG tablet Commonly known as: TYLENOL Take 650 mg by mouth every 6 (six) hours as needed for mild pain.   amLODipine 5 MG tablet Commonly known as: NORVASC Take 5 mg by mouth daily.   aspirin EC 81 MG tablet Take 1 tablet (81 mg total) by mouth daily.   atorvastatin 10 MG tablet Commonly known as: LIPITOR Take 10 mg by mouth daily.   betamethasone  dipropionate 0.05 % ointment Commonly known as: DIPROLENE Apply topically. Apply BID PRN to active rash on extremities . 1 app immediately after shower. Twice A Day - PRN   diclofenac Sodium 1 % Gel Commonly known as: VOLTAREN Apply 2 g topically 3 (three) times daily as needed (knee pain). Apply to both knees   furosemide 20 MG tablet Commonly known as: LASIX Take 20 mg by mouth. Once A Day on Mon, Wed, Fri   losartan 50 MG tablet Commonly known as: Cozaar Take 1 tablet (50 mg total) by mouth daily.   metFORMIN 500 MG 24 hr tablet Commonly known as: GLUCOPHAGE-XR Take 500 mg by mouth 2 (two) times daily. Take 1/2 tablet   metoprolol tartrate 25 MG tablet Commonly  known as: LOPRESSOR Take 12.5 mg by mouth 2 (two) times daily. Hold Metoprolol for SBP < 110 and DSP <50   multivitamins ther. w/minerals Tabs tablet Take 1 tablet by mouth daily. 400 mcg tablet   nitroGLYCERIN 0.4 MG SL tablet Commonly known as: NITROSTAT Place 0.4 mg under the tongue every 5 (five) minutes as needed for chest pain. PLACE 0.4 MG UNDER TONGUE EVERY 5 MINUTES X3 FOR CHEST PAIN, IF NO RELIEF, CALL MD OR SEND TO ED FOR FURTHER EVALUATION.   oxyCODONE-acetaminophen 5-325 MG tablet Commonly known as: PERCOCET/ROXICET Take 1 tablet by mouth every 6 (six) hours as needed for severe pain.   potassium chloride 10 MEQ CR capsule Commonly known as: MICRO-K Take 10 mEq by mouth. Once A Day on Mon, Wed, Fri   Senna 8.6 MG Caps Take 2 tablets by mouth at bedtime.        Review of Systems  Constitutional:  Positive for activity change and appetite change.  HENT: Negative.    Respiratory:  Negative for cough and shortness of breath.   Cardiovascular:  Negative for leg swelling.  Gastrointestinal:  Negative for constipation.  Genitourinary: Negative.   Musculoskeletal:  Positive for gait problem. Negative for arthralgias and myalgias.  Skin: Negative.   Neurological:  Negative for dizziness and  weakness.  Psychiatric/Behavioral:  Positive for confusion. Negative for dysphoric mood and sleep disturbance.    Immunization History  Administered Date(s) Administered   Influenza Whole 03/30/2012, 04/13/2013, 04/04/2018   Influenza, High Dose Seasonal PF 04/02/2019   Influenza-Unspecified 04/17/2014, 03/29/2015, 04/18/2021   Moderna SARS-COV2 Booster Vaccination 05/08/2020   Moderna Sars-Covid-2 Vaccination 07/02/2019, 07/30/2019, 11/27/2020   PFIZER Comirnaty(Gray Top)Covid-19 Tri-Sucrose Vaccine 03/19/2021   Pneumococcal Conjugate-13 12/08/2017   Pneumococcal Polysaccharide-23 06/30/2001   Td 06/30/2001, 12/08/2017   Pertinent  Health Maintenance Due  Topic Date Due   OPHTHALMOLOGY EXAM  04/02/2016   INFLUENZA VACCINE  01/28/2022   HEMOGLOBIN A1C  04/25/2022   FOOT EXAM  10/23/2022   DEXA SCAN  Completed      11/12/2020    2:00 AM 11/12/2020    7:45 AM 11/13/2020    2:00 AM 11/13/2020   10:00 AM 11/05/2021    7:37 PM  Fall Risk  Patient Fall Risk Level Moderate fall risk Moderate fall risk Moderate fall risk Moderate fall risk High fall risk   Functional Status Survey:    Vitals:   11/19/21 1549  BP: (!) 110/91  Pulse: 79  Resp: 17  Temp: (!) 97.1 F (36.2 C)  SpO2: 97%  Weight: 116 lb 4.8 oz (52.8 kg)  Height: '5\' 3"'$  (1.6 m)   Body mass index is 20.6 kg/m. Physical Exam Vitals reviewed.  Constitutional:      Appearance: Normal appearance.  HENT:     Head: Normocephalic.     Nose: Nose normal.     Mouth/Throat:     Mouth: Mucous membranes are moist.     Pharynx: Oropharynx is clear.  Eyes:     Pupils: Pupils are equal, round, and reactive to light.  Cardiovascular:     Rate and Rhythm: Normal rate and regular rhythm.     Pulses: Normal pulses.     Heart sounds: Normal heart sounds. No murmur heard. Pulmonary:     Effort: Pulmonary effort is normal.     Breath sounds: Normal breath sounds.  Abdominal:     General: Abdomen is flat. Bowel sounds are  normal.     Palpations: Abdomen is  soft.  Musculoskeletal:        General: No swelling.     Cervical back: Neck supple.  Skin:    General: Skin is warm.  Neurological:     General: No focal deficit present.     Mental Status: She is alert.  Psychiatric:        Mood and Affect: Mood normal.        Thought Content: Thought content normal.    Labs reviewed: Recent Labs    11/23/20 0000 02/12/21 0000 10/24/21 0000 11/05/21 2039 11/15/21 0000  NA 138   < > 139 136 136*  K 4.4   < > 4.4 4.8 4.4  CL 105   < > 103 104 104  CO2 27*   < > 30* 24 19  GLUCOSE  --   --   --  296*  --   BUN 14   < > 20 20 25*  CREATININE 0.9   < > 1.0 1.24* 1.1  CALCIUM 10.6   < > 10.8* 10.7* 10.3  MG 2.0  --   --   --   --    < > = values in this interval not displayed.   Recent Labs    06/07/21 0000 10/24/21 0000 11/05/21 2039  AST '17 19 27  '$ ALT '10 13 19  '$ ALKPHOS 47 60 52  BILITOT  --   --  0.8  PROT  --   --  6.0*  ALBUMIN 3.4* 4.0 3.5   Recent Labs    10/24/21 0000 11/05/21 2039 11/15/21 0000  WBC 9.6 15.7* 11.4  NEUTROABS 5,002.00 12.2* 7,513.00  HGB 13.0 11.0* 9.9*  HCT 40 32.8* 29*  MCV  --  91.6  --   PLT 290 233 419*   Lab Results  Component Value Date   TSH 0.71 10/24/2021   Lab Results  Component Value Date   HGBA1C 8.3 10/24/2021   Lab Results  Component Value Date   CHOL 184 06/07/2021   HDL 58 06/07/2021   LDLCALC 111 06/07/2021   TRIG 68 06/07/2021   CHOLHDL 2.4 06/27/2018    Significant Diagnostic Results in last 30 days:  DG Chest 1 View  Result Date: 11/05/2021 CLINICAL DATA:  Fall, left arm pain EXAM: CHEST  1 VIEW COMPARISON:  None Available. FINDINGS: Lungs are clear. No pneumothorax or pleural effusion. Cardiac size within normal limits. Pulmonary vascularity is normal. Acute three-part fracture of the left humeral head is seen with fractures involving the greater tuberosity and surgical neck of the humerus. T10 and T12 vertebroplasty has been  performed. IMPRESSION: Acute three-part fracture of the left humeral head. No radiographic evidence of acute cardiopulmonary disease. Electronically Signed   By: Fidela Salisbury M.D.   On: 11/05/2021 20:45   DG Pelvis 1-2 Views  Result Date: 11/05/2021 CLINICAL DATA:  Fall, pelvic injury EXAM: PELVIS - 1-2 VIEW COMPARISON:  None Available. FINDINGS: Normal alignment. No acute fracture or dislocation. Moderate bilateral degenerative hip arthritis with joint space narrowing noted. Densely calcified fibroid noted within the pelvis. Soft tissues are otherwise unremarkable. IMPRESSION: No acute fracture or dislocation. Electronically Signed   By: Fidela Salisbury M.D.   On: 11/05/2021 20:42   DG Forearm Left  Result Date: 11/05/2021 CLINICAL DATA:  Fall, left arm pain EXAM: LEFT FOREARM - 2 VIEW COMPARISON:  None Available. FINDINGS: Normal alignment. No acute fracture or dislocation. Chondrocalcinosis within the radiocarpal articulation is likely degenerative in nature. Soft tissues are unremarkable.  IMPRESSION: No acute fracture or dislocation. Electronically Signed   By: Fidela Salisbury M.D.   On: 11/05/2021 20:43   CT HEAD WO CONTRAST (5MM)  Result Date: 11/05/2021 CLINICAL DATA:  Head trauma, intracranial venous injury suspected, fall EXAM: CT HEAD WITHOUT CONTRAST TECHNIQUE: Contiguous axial images were obtained from the base of the skull through the vertex without intravenous contrast. RADIATION DOSE REDUCTION: This exam was performed according to the departmental dose-optimization program which includes automated exposure control, adjustment of the mA and/or kV according to patient size and/or use of iterative reconstruction technique. COMPARISON:  None Available. FINDINGS: Brain: There is atrophy and chronic small vessel disease changes. No acute intracranial abnormality. Specifically, no hemorrhage, hydrocephalus, mass lesion, acute infarction, or significant intracranial injury. Vascular: No hyperdense  vessel or unexpected calcification. Skull: No acute calvarial abnormality. Sinuses/Orbits: Fractures through the anterior wall of the left maxillary sinus and floor of the left orbit. Left lateral orbital wall fracture. Fracture through the posterior wall of the left maxillary sinus. Blood seen within the left maxillary sinus. Other: None IMPRESSION: No acute intracranial abnormality. Atrophy, chronic small vessel disease. Fractures through the anterior and posterior wall of the maxillary sinus. Fractures through the lateral wall and floor the left orbit. Electronically Signed   By: Rolm Baptise M.D.   On: 11/05/2021 20:23   CT Cervical Spine Wo Contrast  Result Date: 11/05/2021 CLINICAL DATA:  Neck trauma (Age >= 65y).  Fall. EXAM: CT CERVICAL SPINE WITHOUT CONTRAST TECHNIQUE: Multidetector CT imaging of the cervical spine was performed without intravenous contrast. Multiplanar CT image reconstructions were also generated. RADIATION DOSE REDUCTION: This exam was performed according to the departmental dose-optimization program which includes automated exposure control, adjustment of the mA and/or kV according to patient size and/or use of iterative reconstruction technique. COMPARISON:  None Available. FINDINGS: Alignment: Grade 1 degenerative anterolisthesis of C6 on C7 and C7 on T1. Skull base and vertebrae: No acute fracture. No primary bone lesion or focal pathologic process. Soft tissues and spinal canal: Choose 1 Disc levels: Prior anterior fusion from C4-C6. Advanced degenerative disc disease at C6-7 and C2-3. Bilateral moderate degenerative facet disease. Upper chest: Biapical scarring. Other: None IMPRESSION: Degenerative disc and facet disease. Prior ACDF C4-C6. No acute bony abnormality. Electronically Signed   By: Rolm Baptise M.D.   On: 11/05/2021 20:27   DG Humerus Left  Result Date: 11/05/2021 CLINICAL DATA:  Fall, left arm pain EXAM: LEFT HUMERUS - 2+ VIEW COMPARISON:  None Available.  FINDINGS: There is an acute fracture involving the greater tuberosity of the left humeral head which appears posterolaterally displaced. There is a subtle lucency extending across the surgical neck of the humerus and a nondisplaced fracture of the surgical neck is not excluded. Humeral head appears seated within the glenoid fossa. The distal humerus is intact. IMPRESSION: Fractures of the greater tuberosity of the left humeral head and possibly the surgical neck of the left humerus. This could be better assessed with dedicated CT imaging. Electronically Signed   By: Fidela Salisbury M.D.   On: 11/05/2021 20:41   CT Maxillofacial Wo Contrast  Result Date: 11/05/2021 CLINICAL DATA:  Facial trauma, blunt.  Fall. EXAM: CT MAXILLOFACIAL WITHOUT CONTRAST TECHNIQUE: Multidetector CT imaging of the maxillofacial structures was performed. Multiplanar CT image reconstructions were also generated. RADIATION DOSE REDUCTION: This exam was performed according to the departmental dose-optimization program which includes automated exposure control, adjustment of the mA and/or kV according to patient size and/or use of iterative  reconstruction technique. COMPARISON:  None Available. FINDINGS: Osseous: Depressed fracture through the anterior wall of the left maxillary sinus. Fracture also noted through the posterior wall of the left maxillary sinus. Orbits: Fracture through the floor of the left orbit and lateral wall of the left orbit. Globes are intact. No orbital emphysema. Sinuses: Blood seen within the left maxillary sinus. Soft tissues: Soft tissue swelling over the left face and orbit. Limited intracranial: See head CT report IMPRESSION: Fractures through the left lateral orbital wall and floor of the left orbit. Fractures through the anterior and posterior walls of the left maxillary sinus. Left fills the left maxillary sinus. Electronically Signed   By: Rolm Baptise M.D.   On: 11/05/2021 20:25    Assessment/Plan Closed  fracture of maxillary sinus with routine healing, subsequent encounter Doing well Closed fracture of left orbital floor with routine healing, subsequent encounter [facility] Pain Controlled Closed fracture of proximal end of humerus with delayed healing, unspecified fracture  Saw [facility] for 4 weeks Working with therapy Will schedule her Tylenol 650 TID for pain Control  Weight loss At this time she has Lost  few pounds but will continue to monitor Speech to evaluate to see if she needs more modification of her Diet  Other issues  Chronic diastolic congestive heart failure (HCC) On low-dose of Lasix Essential hypertension Continue amlodipine and Cozaar and Metoprolol DM type 2 with diabetic peripheral neuropathy (HCC) On metformin Cognitive impairment Eventually wants to go back to her [place] apartment CKD stage 3 due to type 2 diabetes mellitus (HCC) Creatinine stable Hyperlipidemia, unspecified hyperlipidemia type On statin H/o NSTEMi Follows with Dr Doylene Canard Hypercalcemia PTH in Normal Limits PTHR low. Calcitrol in Normal Limits Has had work up before [employer] staff Communication:   Labs/tests ordered:

## 2021-11-20 ENCOUNTER — Non-Acute Institutional Stay (SKILLED_NURSING_FACILITY): Payer: Medicare Other | Admitting: Orthopedic Surgery

## 2021-11-20 ENCOUNTER — Encounter: Payer: Self-pay | Admitting: Orthopedic Surgery

## 2021-11-20 DIAGNOSIS — S42209G Unspecified fracture of upper end of unspecified humerus, subsequent encounter for fracture with delayed healing: Secondary | ICD-10-CM | POA: Diagnosis not present

## 2021-11-20 DIAGNOSIS — S02401D Maxillary fracture, unspecified, subsequent encounter for fracture with routine healing: Secondary | ICD-10-CM

## 2021-11-20 DIAGNOSIS — S0232XD Fracture of orbital floor, left side, subsequent encounter for fracture with routine healing: Secondary | ICD-10-CM | POA: Diagnosis not present

## 2021-11-20 DIAGNOSIS — N183 Chronic kidney disease, stage 3 unspecified: Secondary | ICD-10-CM

## 2021-11-20 DIAGNOSIS — I5032 Chronic diastolic (congestive) heart failure: Secondary | ICD-10-CM

## 2021-11-20 DIAGNOSIS — R4189 Other symptoms and signs involving cognitive functions and awareness: Secondary | ICD-10-CM

## 2021-11-20 DIAGNOSIS — E1122 Type 2 diabetes mellitus with diabetic chronic kidney disease: Secondary | ICD-10-CM

## 2021-11-20 DIAGNOSIS — R300 Dysuria: Secondary | ICD-10-CM

## 2021-11-20 DIAGNOSIS — E1142 Type 2 diabetes mellitus with diabetic polyneuropathy: Secondary | ICD-10-CM

## 2021-11-20 DIAGNOSIS — I10 Essential (primary) hypertension: Secondary | ICD-10-CM

## 2021-11-20 DIAGNOSIS — R634 Abnormal weight loss: Secondary | ICD-10-CM

## 2021-11-20 NOTE — Progress Notes (Signed)
Location:  [facility] Room Number: N055/A Place of Service:  SNF (31) Provider: Yvonna Alanis, NP   Patient Care Team: Mast, Man X, NP as PCP - General (Internal Medicine) [place] Mast, Man X, NP as Nurse Practitioner (Nurse Practitioner) Jacelyn Pi, MD as Consulting Physician (Endocrinology) Calvert Cantor, MD as Consulting Physician (Ophthalmology) Vickey Huger, MD as Consulting Physician (Orthopedic Surgery) Suella Broad, MD as Consulting Physician (Physical Medicine and Rehabilitation) Erline Levine, MD as Consulting Physician (Neurosurgery) Juanita Craver, MD as Consulting Physician (Gastroenterology) Martinique, Peter M, MD as Consulting Physician (Cardiology) Megan Salon, MD as Consulting Physician (Gynecology)  Extended Emergency Contact Information Primary Emergency Contact: Ladene Artist of [place] Phone: [phone] Relation: Niece Secondary Emergency Contact: Edmon Crape Mobile Phone: [phone] Relation: Other Preferred language: Cleophus Molt Interpreter needed? No Guardian: Meryl Dare Mobile Phone: [phone] Relation: Legal Guardian Preferred language: English Interpreter needed? No  Code Status:  DNR Goals of care: Advanced Directive information    11/20/2021    1:51 PM  Advanced Directives  Does Patient Have a Medical Advance Directive? Yes  Type of Advance Directive Out of facility DNR (pink MOST or yellow form);[place];Living will  Does patient want to make changes to medical advance directive? No - Patient declined  Copy of [place] in Chart? Yes - validated most recent copy scanned in chart (See row information)  Pre-existing out of facility DNR order (yellow form or pink MOST form) Yellow form placed in chart (order not valid for inpatient use)     Chief Complaint  Patient presents with   Medical Management of Chronic Issues    Routine  visit.    Quality Metric Gaps    Discuss the need for Shingrix vaccine and eye exam, or post pone if patient refuses.     HPI:  Pt is a 88 y.o. female seen today for acute visit due to dysuria.   She reports increased burning with urination x 2 days. Describes urine as concentrated with odor. Denies fever or flank pain. She was seen by Dr. Lyndel Safe yesterday regarding weight loss/ poor appetite. Recommended ST evaluation and monthly weights.   Fracture to maxillary sinus/ left orbital- pain controlled, bruising slowly subsiding, completed Augmentin recently- sinus infection prevention,  remains on Percocet and tylenol prn Fracture to humerus- followed by [facility], remains in soft cast x 4 weeks, 05/10 she was moved [place]F [place] for [place]/OT CHF- LVEF 65-70% 10/2020, remains on lasix HTN- BUN/creat 25/1.1 11/15/2021, remains on losartan, amlodipine and metoprolol T2DM- A1c 8.3 10/24/2021, metformin recently increased, remains on carb mod diet Cognitive impairment- no behavioral outbursts, was doing well in [place] prior to fall CKD- BUN/creat 25/1.1 11/15/2021 Hypercalcemia- calcium 10.3 (05/16) 10.7 (05/09), was 10.8, PTH normal, PTHR low, Calcitrol normal        Past Medical History:  Diagnosis Date   Contact dermatitis and other eczema, due to unspecified cause    Cortical senile cataract    GERD (gastroesophageal reflux disease) 02/17/2013   Hyperparathyroidism, unspecified ([place]) 03/26/2010   Irritable bowel syndrome    Keratoconjunctivitis sicca, not specified as Sjogren's    Loss of weight    Macular degeneration (senile) of retina, unspecified    Memory loss    Other and unspecified hyperlipidemia    Other malaise and fatigue 06/17/2010   Pain in joint, site unspecified    Pathologic fracture of vertebrae    Reflux esophagitis  Scoliosis (and kyphoscoliosis), idiopathic    Senile osteoporosis    Type II or unspecified type diabetes mellitus without mention of complication,  uncontrolled    Unspecified essential hypertension    Unspecified hereditary and idiopathic peripheral neuropathy    Unspecified vitamin D deficiency    Past Surgical History:  Procedure Laterality Date   APPENDECTOMY     BREAST LUMPECTOMY  02/05/2010   Dr. Gershon Mussel Cornett   CATARACT EXTRACTION W/ INTRAOCULAR LENS  IMPLANT, BILATERAL  04/2013   Dr. Bing Plume   CHOLECYSTECTOMY  2004   Dr. Zella Richer   COLONOSCOPY  2001   Dr. Collene Mares   fibroidectomy  1950   HEMORRHOID SURGERY  1978   KYPHOPLASTY  2010   T10 & T12 Dr. Erline Levine   MEDIAL PARTIAL KNEE REPLACEMENT Left 2004   Dr. Ronnie Derby   SPINE SURGERY     vertebroplasty T10, T12   SPINE SURGERY  11/02/2001    C4-5 & C5-6 Titanium Plate Placed Dr. Carloyn Manner   THYROIDECTOMY, PARTIAL Left 2007   TOTAL KNEE ARTHROPLASTY Right 2009   Dr. Alvan Dame     Allergies  Allergen Reactions   Bactrim     unknown   Betadine [Povidone Iodine]     unknown   Metformin And Related     Hurt stomach   Nutmeg Oil (Myristica Oil)     unknown   Sulfa Antibiotics     unknown    Outpatient Encounter Medications as of 11/20/2021  Medication Sig   acetaminophen (TYLENOL) 325 MG tablet Take 650 mg by mouth every 6 (six) hours as needed for mild pain. Three times a day   amLODipine (NORVASC) 5 MG tablet Take 5 mg by mouth daily.    aspirin EC 81 MG EC tablet Take 1 tablet (81 mg total) by mouth daily.   atorvastatin (LIPITOR) 10 MG tablet Take 10 mg by mouth daily.   betamethasone dipropionate (DIPROLENE) 0.05 % ointment Apply topically. Apply BID PRN to active rash on extremities . 1 app immediately after shower. Twice A Day - PRN   diclofenac Sodium (VOLTAREN) 1 % GEL Apply 2 g topically 3 (three) times daily as needed (knee pain). Apply to both knees   furosemide (LASIX) 20 MG tablet Take 20 mg by mouth. Once A Day on Mon, Wed, Fri   losartan (COZAAR) 50 MG tablet Take 1 tablet (50 mg total) by mouth daily.   metFORMIN (GLUCOPHAGE-XR) 500 MG 24 hr tablet Take 500 mg  by mouth 2 (two) times daily. Take 1/2 tablet   metoprolol tartrate (LOPRESSOR) 25 MG tablet Take 12.5 mg by mouth 2 (two) times daily. Hold Metoprolol for SBP < 110 and DSP <50   Multiple Vitamins-Minerals (MULTIVITAMINS THER. W/MINERALS) TABS tablet Take 1 tablet by mouth daily. 400 mcg tablet   nitroGLYCERIN (NITROSTAT) 0.4 MG SL tablet Place 0.4 mg under the tongue every 5 (five) minutes as needed for chest pain. PLACE 0.4 MG UNDER TONGUE EVERY 5 MINUTES X3 FOR CHEST PAIN, IF NO RELIEF, CALL MD OR SEND TO ED FOR FURTHER EVALUATION.   oxyCODONE-acetaminophen (PERCOCET/ROXICET) 5-325 MG tablet Take 1 tablet by mouth every 6 (six) hours as needed for severe pain.   potassium chloride (MICRO-K) 10 MEQ CR capsule Take 10 mEq by mouth. Once A Day on Mon, Wed, Fri   Sennosides (SENNA) 8.6 MG CAPS Take 2 tablets by mouth at bedtime.   No facility-administered encounter medications on file as of 11/20/2021.    Review of Systems  Constitutional:  Negative for activity change, appetite change, chills, fatigue and fever.  HENT:  Negative for congestion and trouble swallowing.   Eyes:  Negative for visual disturbance.  Respiratory:  Negative for cough, shortness of breath and wheezing.   Cardiovascular:  Negative for chest pain and leg swelling.  Gastrointestinal:  Negative for abdominal distention, abdominal pain, constipation, diarrhea, nausea and vomiting.  Genitourinary:  Positive for dysuria and frequency. Negative for hematuria.  Musculoskeletal:  Positive for arthralgias, back pain, gait problem and joint swelling.  Skin:  Positive for wound.  Neurological:  Positive for weakness. Negative for dizziness and headaches.  Psychiatric/Behavioral:  Positive for confusion and dysphoric mood. Negative for sleep disturbance. The patient is not nervous/anxious.    Immunization History  Administered Date(s) Administered   Influenza Whole 03/30/2012, 04/13/2013, 04/04/2018   Influenza, High Dose  Seasonal PF 04/02/2019   Influenza-Unspecified 04/17/2014, 03/29/2015, 04/18/2021   Moderna SARS-COV2 Booster Vaccination 05/08/2020   Moderna Sars-Covid-2 Vaccination 07/02/2019, 07/30/2019, 11/27/2020   PFIZER Comirnaty(Gray Top)Covid-19 Tri-Sucrose Vaccine 03/19/2021   Pneumococcal Conjugate-13 12/08/2017   Pneumococcal Polysaccharide-23 06/30/2001   Td 06/30/2001, 12/08/2017   Pertinent  Health Maintenance Due  Topic Date Due   OPHTHALMOLOGY EXAM  04/02/2016   INFLUENZA VACCINE  01/28/2022   HEMOGLOBIN A1C  04/25/2022   FOOT EXAM  10/23/2022   DEXA SCAN  Completed      11/12/2020    2:00 AM 11/12/2020    7:45 AM 11/13/2020    2:00 AM 11/13/2020   10:00 AM 11/05/2021    7:37 PM  Fall Risk  Patient Fall Risk Level Moderate fall risk Moderate fall risk Moderate fall risk Moderate fall risk High fall risk   Functional Status Survey:    Vitals:   11/20/21 1347  BP: 126/74  Pulse: 65  Resp: 18  Temp: (!) 97.1 F (36.2 C)  SpO2: 95%  Weight: 116 lb 4.8 oz (52.8 kg)  Height: '5\' 3"'$  (1.6 m)   Body mass index is 20.6 kg/m. Physical Exam Vitals reviewed.  Constitutional:      General: She is not in acute distress. HENT:     Head: Normocephalic.  Eyes:     General:        Right eye: No discharge.        Left eye: No discharge.  Cardiovascular:     Rate and Rhythm: Normal rate and regular rhythm.     Pulses: Normal pulses.     Heart sounds: Normal heart sounds.  Pulmonary:     Effort: Pulmonary effort is normal. No respiratory distress.     Breath sounds: Normal breath sounds. No wheezing or rales.  Abdominal:     General: Bowel sounds are normal. There is no distension.     Palpations: Abdomen is soft.     Tenderness: There is no abdominal tenderness.  Musculoskeletal:     Cervical back: Neck supple.     Right lower leg: No edema.     Left lower leg: No edema.  Skin:    General: Skin is warm and dry.     Capillary Refill: Capillary refill takes less than 2  seconds.  Neurological:     General: No focal deficit present.     Mental Status: She is alert. Mental status is at baseline.     Motor: Weakness present.     Gait: Gait abnormal.  Psychiatric:        Mood and Affect: Mood normal.  Behavior: Behavior normal.    Labs reviewed: Recent Labs    11/23/20 0000 02/12/21 0000 10/24/21 0000 11/05/21 2039 11/15/21 0000  NA 138   < > 139 136 136*  K 4.4   < > 4.4 4.8 4.4  CL 105   < > 103 104 104  CO2 27*   < > 30* 24 19  GLUCOSE  --   --   --  296*  --   BUN 14   < > 20 20 25*  CREATININE 0.9   < > 1.0 1.24* 1.1  CALCIUM 10.6   < > 10.8* 10.7* 10.3  MG 2.0  --   --   --   --    < > = values in this interval not displayed.   Recent Labs    06/07/21 0000 10/24/21 0000 11/05/21 2039  AST '17 19 27  '$ ALT '10 13 19  '$ ALKPHOS 47 60 52  BILITOT  --   --  0.8  PROT  --   --  6.0*  ALBUMIN 3.4* 4.0 3.5   Recent Labs    10/24/21 0000 11/05/21 2039 11/15/21 0000  WBC 9.6 15.7* 11.4  NEUTROABS 5,002.00 12.2* 7,513.00  HGB 13.0 11.0* 9.9*  HCT 40 32.8* 29*  MCV  --  91.6  --   PLT 290 233 419*   Lab Results  Component Value Date   TSH 0.71 10/24/2021   Lab Results  Component Value Date   HGBA1C 8.3 10/24/2021   Lab Results  Component Value Date   CHOL 184 06/07/2021   HDL 58 06/07/2021   LDLCALC 111 06/07/2021   TRIG 68 06/07/2021   CHOLHDL 2.4 06/27/2018    Significant Diagnostic Results in last 30 days:  DG Chest 1 View  Result Date: 11/05/2021 CLINICAL DATA:  Fall, left arm pain EXAM: CHEST  1 VIEW COMPARISON:  None Available. FINDINGS: Lungs are clear. No pneumothorax or pleural effusion. Cardiac size within normal limits. Pulmonary vascularity is normal. Acute three-part fracture of the left humeral head is seen with fractures involving the greater tuberosity and surgical neck of the humerus. T10 and T12 vertebroplasty has been performed. IMPRESSION: Acute three-part fracture of the left humeral head. No  radiographic evidence of acute cardiopulmonary disease. Electronically Signed   By: Fidela Salisbury M.D.   On: 11/05/2021 20:45   DG Pelvis 1-2 Views  Result Date: 11/05/2021 CLINICAL DATA:  Fall, pelvic injury EXAM: PELVIS - 1-2 VIEW COMPARISON:  None Available. FINDINGS: Normal alignment. No acute fracture or dislocation. Moderate bilateral degenerative hip arthritis with joint space narrowing noted. Densely calcified fibroid noted within the pelvis. Soft tissues are otherwise unremarkable. IMPRESSION: No acute fracture or dislocation. Electronically Signed   By: Fidela Salisbury M.D.   On: 11/05/2021 20:42   DG Forearm Left  Result Date: 11/05/2021 CLINICAL DATA:  Fall, left arm pain EXAM: LEFT FOREARM - 2 VIEW COMPARISON:  None Available. FINDINGS: Normal alignment. No acute fracture or dislocation. Chondrocalcinosis within the radiocarpal articulation is likely degenerative in nature. Soft tissues are unremarkable. IMPRESSION: No acute fracture or dislocation. Electronically Signed   By: Fidela Salisbury M.D.   On: 11/05/2021 20:43   CT HEAD WO CONTRAST (5MM)  Result Date: 11/05/2021 CLINICAL DATA:  Head trauma, intracranial venous injury suspected, fall EXAM: CT HEAD WITHOUT CONTRAST TECHNIQUE: Contiguous axial images were obtained from the base of the skull through the vertex without intravenous contrast. RADIATION DOSE REDUCTION: This exam was performed according to  the departmental dose-optimization program which includes automated exposure control, adjustment of the mA and/or kV according to patient size and/or use of iterative reconstruction technique. COMPARISON:  None Available. FINDINGS: Brain: There is atrophy and chronic small vessel disease changes. No acute intracranial abnormality. Specifically, no hemorrhage, hydrocephalus, mass lesion, acute infarction, or significant intracranial injury. Vascular: No hyperdense vessel or unexpected calcification. Skull: No acute calvarial abnormality.  Sinuses/Orbits: Fractures through the anterior wall of the left maxillary sinus and floor of the left orbit. Left lateral orbital wall fracture. Fracture through the posterior wall of the left maxillary sinus. Blood seen within the left maxillary sinus. Other: None IMPRESSION: No acute intracranial abnormality. Atrophy, chronic small vessel disease. Fractures through the anterior and posterior wall of the maxillary sinus. Fractures through the lateral wall and floor the left orbit. Electronically Signed   By: Rolm Baptise M.D.   On: 11/05/2021 20:23   CT Cervical Spine Wo Contrast  Result Date: 11/05/2021 CLINICAL DATA:  Neck trauma (Age >= 65y).  Fall. EXAM: CT CERVICAL SPINE WITHOUT CONTRAST TECHNIQUE: Multidetector CT imaging of the cervical spine was performed without intravenous contrast. Multiplanar CT image reconstructions were also generated. RADIATION DOSE REDUCTION: This exam was performed according to the departmental dose-optimization program which includes automated exposure control, adjustment of the mA and/or kV according to patient size and/or use of iterative reconstruction technique. COMPARISON:  None Available. FINDINGS: Alignment: Grade 1 degenerative anterolisthesis of C6 on C7 and C7 on T1. Skull base and vertebrae: No acute fracture. No primary bone lesion or focal pathologic process. Soft tissues and spinal canal: Choose 1 Disc levels: Prior anterior fusion from C4-C6. Advanced degenerative disc disease at C6-7 and C2-3. Bilateral moderate degenerative facet disease. Upper chest: Biapical scarring. Other: None IMPRESSION: Degenerative disc and facet disease. Prior ACDF C4-C6. No acute bony abnormality. Electronically Signed   By: Rolm Baptise M.D.   On: 11/05/2021 20:27   DG Humerus Left  Result Date: 11/05/2021 CLINICAL DATA:  Fall, left arm pain EXAM: LEFT HUMERUS - 2+ VIEW COMPARISON:  None Available. FINDINGS: There is an acute fracture involving the greater tuberosity of the left  humeral head which appears posterolaterally displaced. There is a subtle lucency extending across the surgical neck of the humerus and a nondisplaced fracture of the surgical neck is not excluded. Humeral head appears seated within the glenoid fossa. The distal humerus is intact. IMPRESSION: Fractures of the greater tuberosity of the left humeral head and possibly the surgical neck of the left humerus. This could be better assessed with dedicated CT imaging. Electronically Signed   By: Fidela Salisbury M.D.   On: 11/05/2021 20:41   CT Maxillofacial Wo Contrast  Result Date: 11/05/2021 CLINICAL DATA:  Facial trauma, blunt.  Fall. EXAM: CT MAXILLOFACIAL WITHOUT CONTRAST TECHNIQUE: Multidetector CT imaging of the maxillofacial structures was performed. Multiplanar CT image reconstructions were also generated. RADIATION DOSE REDUCTION: This exam was performed according to the departmental dose-optimization program which includes automated exposure control, adjustment of the mA and/or kV according to patient size and/or use of iterative reconstruction technique. COMPARISON:  None Available. FINDINGS: Osseous: Depressed fracture through the anterior wall of the left maxillary sinus. Fracture also noted through the posterior wall of the left maxillary sinus. Orbits: Fracture through the floor of the left orbit and lateral wall of the left orbit. Globes are intact. No orbital emphysema. Sinuses: Blood seen within the left maxillary sinus. Soft tissues: Soft tissue swelling over the left face and orbit. Limited  intracranial: See head CT report IMPRESSION: Fractures through the left lateral orbital wall and floor of the left orbit. Fractures through the anterior and posterior walls of the left maxillary sinus. Left fills the left maxillary sinus. Electronically Signed   By: Rolm Baptise M.D.   On: 11/05/2021 20:25    Assessment/Plan 1. Dysuria - onset 2 days ago - also reports increased frequency and abnormal urine  odor - afebrile, no CVA pain - Ua/culture - advised to increase hydration with water  2. Closed fracture of maxillary sinus with routine healing, subsequent encounter - pain controlled with percocet and tylenol prn - completed Augmentin  3. Closed fracture of left orbital floor with routine healing, subsequent encounter - bruising is subsiding - see above  4. Closed fracture of proximal end of humerus with delayed healing, unspecified fracture morphology, unspecified laterality, subsequent encounter - Followed by [facility] - in soft cast x 4 weeks - cont percocet and tylenol prn  5. Weight loss - eating less due to pain with chewing - recently changed to mechanical soft diet - ST consult ordered - cont monthly weights  6. Chronic diastolic congestive heart failure (HCC) - cont furosemide  7. Essential hypertension - controlled with metoprolol, losartan and amlodipine  8. DM type 2 with diabetic peripheral neuropathy (HCC) - A1c stable - cont metformin, asa, statin, ARB  9. Cognitive impairment - no behavioral outbursts - plan to move to [place] when recovered  10. CKD stage 3 due to type 2 diabetes mellitus ([place]) - avoid nephrotoxic drugs like NSAIDS and dose adjust medications to be renally excreted -encourage hydration with water   11. Hypercalcemia - Calcium 10.3 11/15/2021    Family/ staff Communication: plan discussed with patient and nurse  Labs/tests ordered:  UA/culture

## 2021-11-26 LAB — HEMOGLOBIN A1C: Hemoglobin A1C: 7.2

## 2022-01-02 ENCOUNTER — Non-Acute Institutional Stay (SKILLED_NURSING_FACILITY): Payer: Medicare Other | Admitting: Adult Health

## 2022-01-02 ENCOUNTER — Encounter: Payer: Self-pay | Admitting: Adult Health

## 2022-01-02 DIAGNOSIS — S42202S Unspecified fracture of upper end of left humerus, sequela: Secondary | ICD-10-CM | POA: Diagnosis not present

## 2022-01-02 DIAGNOSIS — I5032 Chronic diastolic (congestive) heart failure: Secondary | ICD-10-CM | POA: Diagnosis not present

## 2022-01-02 DIAGNOSIS — S0232XD Fracture of orbital floor, left side, subsequent encounter for fracture with routine healing: Secondary | ICD-10-CM

## 2022-01-02 DIAGNOSIS — I10 Essential (primary) hypertension: Secondary | ICD-10-CM | POA: Diagnosis not present

## 2022-01-02 DIAGNOSIS — E1142 Type 2 diabetes mellitus with diabetic polyneuropathy: Secondary | ICD-10-CM | POA: Diagnosis not present

## 2022-01-02 DIAGNOSIS — I25118 Atherosclerotic heart disease of native coronary artery with other forms of angina pectoris: Secondary | ICD-10-CM

## 2022-01-02 NOTE — Progress Notes (Unsigned)
Location:  [place] Room Number: N055/A Place of Service:  SNF (31) Provider:  Durenda Age, DNP, FNP-BC  Patient Care Team: Mast, Man X, NP as PCP - General (Internal Medicine) [place] Mast, Man X, NP as Nurse Practitioner (Nurse Practitioner) Jacelyn Pi, MD as Consulting Physician (Endocrinology) Calvert Cantor, MD as Consulting Physician (Ophthalmology) Vickey Huger, MD as Consulting Physician (Orthopedic Surgery) Suella Broad, MD as Consulting Physician (Physical Medicine and Rehabilitation) Erline Levine, MD as Consulting Physician (Neurosurgery) Juanita Craver, MD as Consulting Physician (Gastroenterology) Martinique, Peter M, MD as Consulting Physician (Cardiology) Megan Salon, MD as Consulting Physician (Gynecology)  Extended Emergency Contact Information Primary Emergency Contact: Ladene Artist of [place] Phone: [phone] Relation: Niece Secondary Emergency Contact: Edmon Crape Mobile Phone: [phone] Relation: Other Preferred language: Cleophus Molt Interpreter needed? No Guardian: Meryl Dare Mobile Phone: [phone] Relation: Legal Guardian Preferred language: English Interpreter needed? No  Code Status:  DNR  Goals of care: Advanced Directive information    01/02/2022    3:56 PM  Advanced Directives  Does Patient Have a Medical Advance Directive? Yes  Type of Advance Directive Out of facility DNR (pink MOST or yellow form);[place];Living will  Does patient want to make changes to medical advance directive? No - Patient declined  Copy of [place] in Chart? Yes - validated most recent copy scanned in chart (See row information)  Pre-existing out of facility DNR order (yellow form or pink MOST form) Yellow form placed in chart (order not valid for inpatient use)     Chief Complaint  Patient presents with   Discharge Note    For  discharge to [facility]    HPI:  Pt is a 88 y.o. female who is for discharge to [facility] on 01/03/22.  She was admitted to [facility] on 11/06/2021 post ED visit 11/05/21 due to a mechanical fall hitting her left shoulder and face.  Imaging showed left humeral head fracture and lateral orbital wall and floor of the left orbit fracture.  She was given an immobilizer and had outpatient follow-up.  She followed up with [facility] and left shoulder fracture was treated conservatively with a sling x 4 weeks.  Patient was admitted to this facility for short-term rehabilitation after the patient's recent fall.  Patient has completed SNF rehabilitation and therapy has cleared the patient for discharge.   Past Medical History:  Diagnosis Date   Contact dermatitis and other eczema, due to unspecified cause    Cortical senile cataract    GERD (gastroesophageal reflux disease) 02/17/2013   Hyperparathyroidism, unspecified ([place]) 03/26/2010   Irritable bowel syndrome    Keratoconjunctivitis sicca, not specified as Sjogren's    Loss of weight    Macular degeneration (senile) of retina, unspecified    Memory loss    Other and unspecified hyperlipidemia    Other malaise and fatigue 06/17/2010   Pain in joint, site unspecified    Pathologic fracture of vertebrae    Reflux esophagitis    Scoliosis (and kyphoscoliosis), idiopathic    Senile osteoporosis    Type II or unspecified type diabetes mellitus without mention of complication, uncontrolled    Unspecified essential hypertension    Unspecified hereditary and idiopathic peripheral neuropathy    Unspecified vitamin D deficiency    Past Surgical History:  Procedure Laterality Date   APPENDECTOMY     BREAST LUMPECTOMY  02/05/2010   Dr.  Tom Cornett   CATARACT EXTRACTION W/ INTRAOCULAR LENS  IMPLANT, BILATERAL  04/2013   Dr. Bing Plume   CHOLECYSTECTOMY  2004   Dr. Zella Richer   COLONOSCOPY  2001   Dr. Collene Mares   fibroidectomy  1950    HEMORRHOID SURGERY  1978   KYPHOPLASTY  2010   T10 & T12 Dr. Erline Levine   MEDIAL PARTIAL KNEE REPLACEMENT Left 2004   Dr. Ronnie Derby   SPINE SURGERY     vertebroplasty T10, T12   SPINE SURGERY  11/02/2001    C4-5 & C5-6 Titanium Plate Placed Dr. Carloyn Manner   THYROIDECTOMY, PARTIAL Left 2007   TOTAL KNEE ARTHROPLASTY Right 2009   Dr. Alvan Dame     Allergies  Allergen Reactions   Bactrim     unknown   Betadine [Povidone Iodine]     unknown   Metformin And Related     Hurt stomach   Nutmeg Oil (Myristica Oil)     unknown   Sulfa Antibiotics     unknown    Outpatient Encounter Medications as of 01/02/2022  Medication Sig   acetaminophen (TYLENOL) 325 MG tablet Take 650 mg by mouth every 6 (six) hours as needed for mild pain. Three times a day   amLODipine (NORVASC) 5 MG tablet Take 5 mg by mouth daily.    aspirin EC 81 MG EC tablet Take 1 tablet (81 mg total) by mouth daily.   atorvastatin (LIPITOR) 10 MG tablet Take 10 mg by mouth daily.   betamethasone dipropionate (DIPROLENE) 0.05 % ointment Apply topically. Apply BID PRN to active rash on extremities . 1 app immediately after shower. Twice A Day - PRN   diclofenac Sodium (VOLTAREN) 1 % GEL Apply 2 g topically 3 (three) times daily as needed (knee pain). Apply to both knees   furosemide (LASIX) 20 MG tablet Take 20 mg by mouth. Once A Day on Mon, Wed, Fri   losartan (COZAAR) 50 MG tablet Take 1 tablet (50 mg total) by mouth daily.   metFORMIN (GLUCOPHAGE-XR) 500 MG 24 hr tablet Take 500 mg by mouth 2 (two) times daily. Take 1/2 tablet   metoprolol tartrate (LOPRESSOR) 25 MG tablet Take 12.5 mg by mouth 2 (two) times daily. Hold Metoprolol for SBP < 110 and DSP <50   Multiple Vitamins-Minerals (MULTIVITAMINS THER. W/MINERALS) TABS tablet Take 1 tablet by mouth daily. 400 mcg tablet   nitroGLYCERIN (NITROSTAT) 0.4 MG SL tablet Place 0.4 mg under the tongue every 5 (five) minutes as needed for chest pain. PLACE 0.4 MG UNDER TONGUE EVERY 5 MINUTES  X3 FOR CHEST PAIN, IF NO RELIEF, CALL MD OR SEND TO ED FOR FURTHER EVALUATION.   oxyCODONE-acetaminophen (PERCOCET/ROXICET) 5-325 MG tablet Take 1 tablet by mouth every 6 (six) hours as needed for severe pain.   potassium chloride (MICRO-K) 10 MEQ CR capsule Take 10 mEq by mouth. Once A Day on Mon, Wed, Fri   [DISCONTINUED] Sennosides (SENNA) 8.6 MG CAPS Take 2 tablets by mouth at bedtime.   No facility-administered encounter medications on file as of 01/02/2022.    Review of Systems  Constitutional:  Negative for appetite change, chills, fatigue and fever.  HENT:  Negative for congestion, hearing loss, rhinorrhea and sore throat.   Eyes: Negative.   Respiratory:  Negative for cough, shortness of breath and wheezing.   Cardiovascular:  Negative for chest pain, palpitations and leg swelling.  Gastrointestinal:  Negative for abdominal pain, constipation, diarrhea, nausea and vomiting.  Genitourinary:  Negative for dysuria.  Musculoskeletal:  Negative for arthralgias, back pain and myalgias.  Skin:  Negative for color change, rash and wound.  Neurological:  Negative for dizziness, weakness and headaches.  Psychiatric/Behavioral:  Negative for behavioral problems. The patient is not nervous/anxious.     Immunization History  Administered Date(s) Administered   Influenza Whole 03/30/2012, 04/13/2013, 04/04/2018   Influenza, High Dose Seasonal PF 04/02/2019   Influenza-Unspecified 04/17/2014, 03/29/2015, 04/18/2021   Moderna SARS-COV2 Booster Vaccination 05/08/2020   Moderna Sars-Covid-2 Vaccination 07/02/2019, 07/30/2019, 11/27/2020   PFIZER Comirnaty(Gray Top)Covid-19 Tri-Sucrose Vaccine 03/19/2021   Pneumococcal Conjugate-13 12/08/2017   Pneumococcal Polysaccharide-23 06/30/2001   Td 06/30/2001, 12/08/2017   Pertinent  Health Maintenance Due  Topic Date Due   OPHTHALMOLOGY EXAM  04/02/2016   INFLUENZA VACCINE  01/28/2022   HEMOGLOBIN A1C  05/29/2022   FOOT EXAM  10/23/2022   DEXA  SCAN  Completed      11/12/2020    2:00 AM 11/12/2020    7:45 AM 11/13/2020    2:00 AM 11/13/2020   10:00 AM 11/05/2021    7:37 PM  Fall Risk  Patient Fall Risk Level Moderate fall risk Moderate fall risk Moderate fall risk Moderate fall risk High fall risk     Vitals:   01/02/22 1552  BP: (!) 155/76  Pulse: 74  Resp: 20  Temp: (!) 97 F (36.1 C)  SpO2: 98%  Weight: 103 lb 9.6 oz (47 kg)  Height: '5\' 3"'$  (1.6 m)   Body mass index is 18.35 kg/m.  Physical Exam Constitutional:      General: She is not in acute distress. HENT:     Head: Normocephalic and atraumatic.     Nose: Nose normal.     Mouth/Throat:     Mouth: Mucous membranes are moist.  Eyes:     Conjunctiva/sclera: Conjunctivae normal.  Cardiovascular:     Rate and Rhythm: Normal rate and regular rhythm.  Pulmonary:     Effort: Pulmonary effort is normal.     Breath sounds: Normal breath sounds.  Abdominal:     General: Bowel sounds are normal.     Palpations: Abdomen is soft.  Musculoskeletal:        General: Normal range of motion.     Cervical back: Normal range of motion.  Skin:    General: Skin is warm and dry.  Neurological:     General: No focal deficit present.     Mental Status: She is alert and oriented to person, place, and time.  Psychiatric:        Mood and Affect: Mood normal.        Behavior: Behavior normal.        Thought Content: Thought content normal.        Judgment: Judgment normal.        Labs reviewed: Recent Labs    10/24/21 0000 11/05/21 2039 11/15/21 0000  NA 139 136 136*  K 4.4 4.8 4.4  CL 103 104 104  CO2 30* 24 19  GLUCOSE  --  296*  --   BUN 20 20 25*  CREATININE 1.0 1.24* 1.1  CALCIUM 10.8* 10.7* 10.3   Recent Labs    06/07/21 0000 10/24/21 0000 11/05/21 2039  AST '17 19 27  '$ ALT '10 13 19  '$ ALKPHOS 47 60 52  BILITOT  --   --  0.8  PROT  --   --  6.0*  ALBUMIN 3.4* 4.0 3.5   Recent Labs    10/24/21 0000 11/05/21  2039 11/15/21 0000  WBC 9.6 15.7*  11.4  NEUTROABS 5,002.00 12.2* 7,513.00  HGB 13.0 11.0* 9.9*  HCT 40 32.8* 29*  MCV  --  91.6  --   PLT 290 233 419*   Lab Results  Component Value Date   TSH 0.71 10/24/2021   Lab Results  Component Value Date   HGBA1C 7.2 11/26/2021   Lab Results  Component Value Date   CHOL 184 06/07/2021   HDL 58 06/07/2021   LDLCALC 111 06/07/2021   TRIG 68 06/07/2021   CHOLHDL 2.4 06/27/2018    Significant Diagnostic Results in last 30 days:  No results found.  Assessment/Plan  1. Closed fracture of proximal end of left humerus, unspecified fracture morphology, sequela Closed fracture of left orbital floor with routine healing, subsequent encounter -Treated conservatively with sling to left arm X 4 week - Follow-up with orthopedics -   Continue Percocet 5-325 mg every 6 hours PRN  2. Chronic diastolic congestive heart failure (HCC) -   Continue Lasix 20 mg daily  3. Essential hypertension -   Continue amlodipine 5 mg daily, losartan 50 mg daily and metoprolol tartrate 12.5 mg twice a day  4. DM type 2 with diabetic peripheral neuropathy (HCC) Lab Results  Component Value Date   HGBA1C 7.2 11/26/2021   -   Continue metformin 500 mg twice a day  5. Atherosclerosis of native coronary artery of native heart with stable angina pectoris (HCC) -   Continue NTG as needed, Lipitor 10 mg daily and aspirin 81 mg daily     I have filled out patient's discharge paperwork.  DME provided:  None  Total discharge time: Greater than 30 minutes  Greater than 50% was spent in counseling and coordination of care.   Discharge time involved coordination of the discharge process with social worker, nursing staff and therapy department.     Durenda Age, DNP, MSN, FNP-BC [facility] [phone] (Monday-Friday 8:00 a.m. - 5:00 p.m.) [phone] (after hours)

## 2022-01-15 ENCOUNTER — Encounter: Payer: Self-pay | Admitting: Orthopedic Surgery

## 2022-01-15 ENCOUNTER — Non-Acute Institutional Stay: Payer: Medicare Other | Admitting: Orthopedic Surgery

## 2022-01-15 DIAGNOSIS — R634 Abnormal weight loss: Secondary | ICD-10-CM

## 2022-01-15 DIAGNOSIS — E1142 Type 2 diabetes mellitus with diabetic polyneuropathy: Secondary | ICD-10-CM

## 2022-01-15 DIAGNOSIS — I5032 Chronic diastolic (congestive) heart failure: Secondary | ICD-10-CM | POA: Diagnosis not present

## 2022-01-15 DIAGNOSIS — R4189 Other symptoms and signs involving cognitive functions and awareness: Secondary | ICD-10-CM

## 2022-01-15 DIAGNOSIS — I10 Essential (primary) hypertension: Secondary | ICD-10-CM | POA: Diagnosis not present

## 2022-01-15 DIAGNOSIS — E1122 Type 2 diabetes mellitus with diabetic chronic kidney disease: Secondary | ICD-10-CM

## 2022-01-15 DIAGNOSIS — N183 Chronic kidney disease, stage 3 unspecified: Secondary | ICD-10-CM

## 2022-01-15 NOTE — Progress Notes (Signed)
Location:  [place] Room Number: AL/814/A Place of Service:  SNF (31) Provider:  Windell Moulding, NP  Mast, Man X, NP  Patient Care Team: Mast, Man X, NP as PCP - General (Internal Medicine) [place] Mast, Man X, NP as Nurse Practitioner (Nurse Practitioner) Jacelyn Pi, MD as Consulting Physician (Endocrinology) Calvert Cantor, MD as Consulting Physician (Ophthalmology) Vickey Huger, MD as Consulting Physician (Orthopedic Surgery) Suella Broad, MD as Consulting Physician (Physical Medicine and Rehabilitation) Erline Levine, MD as Consulting Physician (Neurosurgery) Juanita Craver, MD as Consulting Physician (Gastroenterology) Martinique, Peter M, MD as Consulting Physician (Cardiology) Megan Salon, MD as Consulting Physician (Gynecology)  Extended Emergency Contact Information Primary Emergency Contact: Ladene Artist of [place] Phone: [phone] Relation: Niece Secondary Emergency Contact: Edmon Crape Mobile Phone: [phone] Relation: Other Preferred language: Cleophus Molt Interpreter needed? No Guardian: Meryl Dare Mobile Phone: [phone] Relation: Legal Guardian Preferred language: English Interpreter needed? No  Code Status:  DNR Goals of care: Advanced Directive information    01/15/2022    2:19 PM  Advanced Directives  Does Patient Have a Medical Advance Directive? Yes  Type of Advance Directive Out of facility DNR (pink MOST or yellow form);[place];Living will  Does patient want to make changes to medical advance directive? No - Patient declined  Copy of [place] in Chart? Yes - validated most recent copy scanned in chart (See row information)  Pre-existing out of facility DNR order (yellow form or pink MOST form) Yellow form placed in chart (order not valid for inpatient use)     Chief Complaint  Patient presents with   Acute Visit    Patient is here  for weight loss    HPI:  [name] is a 88 y.o. female seen today for an acute visit for weight loss.   Progressive weight loss x 3 months. See trends below. 07/18 Boost BID started. Eating about 75-100% of breakfast, < 50% of lunch and dinner. Albumin 4.0 10/25/2021.   CHF- LVEF 65-70% 10/2020, denies sob/edema, remains on lasix HTN- BUN/creat 25/1.1 11/15/2021, remains on losartan, amlodipine and metoprolol T2DM- A1c 7.2 11/26/2021, metformin recently increased, remains on carb mod diet and metformin, asa, ARB, statin Cognitive impairment- no behavioral outbursts, 10/2021 CT head noted chronic small vessel disease changes, doing well in AL  CKD- BUN/creat 25/1.1 11/15/2021  Recent weight trends:  07/19- 99.8 lbs  07/07- 103 lbs  05/01- 117.4 lbs  04/01- 118.2 lbs      Past Medical History:  Diagnosis Date   Contact dermatitis and other eczema, due to unspecified cause    Cortical senile cataract    GERD (gastroesophageal reflux disease) 02/17/2013   Hyperparathyroidism, unspecified ([place]) 03/26/2010   Irritable bowel syndrome    Keratoconjunctivitis sicca, not specified as Sjogren's    Loss of weight    Macular degeneration (senile) of retina, unspecified    Memory loss    Other and unspecified hyperlipidemia    Other malaise and fatigue 06/17/2010   Pain in joint, site unspecified    Pathologic fracture of vertebrae    Reflux esophagitis    Scoliosis (and kyphoscoliosis), idiopathic    Senile osteoporosis    Type II or unspecified type diabetes mellitus without mention of complication, uncontrolled    Unspecified essential hypertension    Unspecified hereditary and idiopathic peripheral neuropathy    Unspecified vitamin D deficiency    Past Surgical History:  Procedure Laterality Date  APPENDECTOMY     BREAST LUMPECTOMY  02/05/2010   Dr. Gershon Mussel Cornett   CATARACT EXTRACTION W/ INTRAOCULAR LENS  IMPLANT, BILATERAL  04/2013   Dr. Bing Plume   CHOLECYSTECTOMY  2004   Dr. Zella Richer    COLONOSCOPY  2001   Dr. Collene Mares   fibroidectomy  1950   HEMORRHOID SURGERY  1978   KYPHOPLASTY  2010   T10 & T12 Dr. Erline Levine   MEDIAL PARTIAL KNEE REPLACEMENT Left 2004   Dr. Ronnie Derby   SPINE SURGERY     vertebroplasty T10, T12   SPINE SURGERY  11/02/2001    C4-5 & C5-6 Titanium Plate Placed Dr. Carloyn Manner   THYROIDECTOMY, PARTIAL Left 2007   TOTAL KNEE ARTHROPLASTY Right 2009   Dr. Alvan Dame     Allergies  Allergen Reactions   Bactrim     unknown   Betadine [Povidone Iodine]     unknown   Metformin And Related     Hurt stomach   Nutmeg Oil (Myristica Oil)     unknown   Sulfa Antibiotics     unknown    Outpatient Encounter Medications as of 01/15/2022  Medication Sig   acetaminophen (TYLENOL) 325 MG tablet Take 650 mg by mouth every 6 (six) hours as needed for mild pain. Three times a day   amLODipine (NORVASC) 5 MG tablet Take 5 mg by mouth daily.    aspirin EC 81 MG EC tablet Take 1 tablet (81 mg total) by mouth daily.   atorvastatin (LIPITOR) 10 MG tablet Take 10 mg by mouth daily.   betamethasone dipropionate (DIPROLENE) 0.05 % ointment Apply topically. Apply BID PRN to active rash on extremities . 1 app immediately after shower. Twice A Day - PRN   diclofenac Sodium (VOLTAREN) 1 % GEL Apply 2 g topically 3 (three) times daily as needed (knee pain). Apply to both knees   furosemide (LASIX) 20 MG tablet Take 20 mg by mouth. Once A Day on Mon, Wed, Fri   losartan (COZAAR) 50 MG tablet Take 1 tablet (50 mg total) by mouth daily.   metFORMIN (GLUCOPHAGE-XR) 500 MG 24 hr tablet Take 500 mg by mouth 2 (two) times daily. Take 1/2 tablet   metoprolol tartrate (LOPRESSOR) 25 MG tablet Take 12.5 mg by mouth 2 (two) times daily. Hold Metoprolol for SBP < 110 and DSP <50   Multiple Vitamins-Minerals (MULTIVITAMINS THER. W/MINERALS) TABS tablet Take 1 tablet by mouth daily. 400 mcg tablet   nitroGLYCERIN (NITROSTAT) 0.4 MG SL tablet Place 0.4 mg under the tongue every 5 (five) minutes as  needed for chest pain. PLACE 0.4 MG UNDER TONGUE EVERY 5 MINUTES X3 FOR CHEST PAIN, IF NO RELIEF, CALL MD OR SEND TO ED FOR FURTHER EVALUATION.   oxyCODONE-acetaminophen (PERCOCET/ROXICET) 5-325 MG tablet Take 1 tablet by mouth every 6 (six) hours as needed for severe pain.   potassium chloride (MICRO-K) 10 MEQ CR capsule Take 10 mEq by mouth. Once A Day on Mon, Wed, Fri   No facility-administered encounter medications on file as of 01/15/2022.    Review of Systems  Constitutional:  Positive for unexpected weight change. Negative for activity change, appetite change, fatigue and fever.  HENT:  Negative for congestion, dental problem and trouble swallowing.   Eyes:  Negative for visual disturbance.  Respiratory:  Negative for cough, shortness of breath and wheezing.   Cardiovascular:  Negative for chest pain and leg swelling.  Gastrointestinal:  Negative for abdominal distention, abdominal pain, constipation, diarrhea, nausea and vomiting.  Genitourinary:  Negative for dysuria, frequency and hematuria.  Musculoskeletal:  Positive for arthralgias and gait problem.  Skin:  Negative for wound.  Neurological:  Negative for dizziness, weakness and headaches.  Psychiatric/Behavioral:  Positive for confusion. Negative for dysphoric mood and sleep disturbance. The patient is not nervous/anxious.     Immunization History  Administered Date(s) Administered   Influenza Whole 03/30/2012, 04/13/2013, 04/04/2018   Influenza, High Dose Seasonal PF 04/02/2019   Influenza-Unspecified 04/17/2014, 03/29/2015, 04/18/2021   Moderna SARS-COV2 Booster Vaccination 05/08/2020   Moderna Sars-Covid-2 Vaccination 07/02/2019, 07/30/2019, 11/27/2020   PFIZER Comirnaty(Gray Top)Covid-19 Tri-Sucrose Vaccine 03/19/2021   Pneumococcal Conjugate-13 12/08/2017   Pneumococcal Polysaccharide-23 06/30/2001   Td 06/30/2001, 12/08/2017   Pertinent  Health Maintenance Due  Topic Date Due   OPHTHALMOLOGY EXAM  04/02/2016    INFLUENZA VACCINE  01/28/2022   HEMOGLOBIN A1C  05/29/2022   FOOT EXAM  10/23/2022   DEXA SCAN  Completed      11/12/2020    2:00 AM 11/12/2020    7:45 AM 11/13/2020    2:00 AM 11/13/2020   10:00 AM 11/05/2021    7:37 PM  Fall Risk  Patient Fall Risk Level Moderate fall risk Moderate fall risk Moderate fall risk Moderate fall risk High fall risk   Functional Status Survey:    Vitals:   01/15/22 1418  BP: 110/60  Pulse: 76  Resp: 20  Temp: (!) 97.5 F (36.4 C)  SpO2: 95%  Weight: 99 lb 12.8 oz (45.3 kg)  Height: '5\' 2"'$  (1.575 m)   Body mass index is 18.25 kg/m. Physical Exam Vitals reviewed.  Constitutional:      Appearance: She is underweight.  HENT:     Head: Normocephalic.  Eyes:     General:        Right eye: No discharge.        Left eye: No discharge.  Cardiovascular:     Rate and Rhythm: Normal rate and regular rhythm.     Pulses: Normal pulses.     Heart sounds: Normal heart sounds.  Pulmonary:     Effort: Pulmonary effort is normal. No respiratory distress.     Breath sounds: Normal breath sounds. No wheezing.  Abdominal:     General: Bowel sounds are normal. There is no distension.     Palpations: Abdomen is soft.     Tenderness: There is no abdominal tenderness.  Musculoskeletal:     Cervical back: Neck supple.     Right lower leg: No edema.     Left lower leg: No edema.  Skin:    General: Skin is warm and dry.     Capillary Refill: Capillary refill takes less than 2 seconds.  Neurological:     General: No focal deficit present.     Mental Status: She is alert. Mental status is at baseline.     Motor: Weakness present.     Gait: Gait abnormal.     Comments: rolator  Psychiatric:        Mood and Affect: Mood normal.        Behavior: Behavior normal.     Labs reviewed: Recent Labs    10/24/21 0000 11/05/21 2039 11/15/21 0000  NA 139 136 136*  K 4.4 4.8 4.4  CL 103 104 104  CO2 30* 24 19  GLUCOSE  --  296*  --   BUN 20 20 25*   CREATININE 1.0 1.24* 1.1  CALCIUM 10.8* 10.7* 10.3   Recent Labs  06/07/21 0000 10/24/21 0000 11/05/21 2039  AST '17 19 27  '$ ALT '10 13 19  '$ ALKPHOS 47 60 52  BILITOT  --   --  0.8  PROT  --   --  6.0*  ALBUMIN 3.4* 4.0 3.5   Recent Labs    10/24/21 0000 11/05/21 2039 11/15/21 0000  WBC 9.6 15.7* 11.4  NEUTROABS 5,002.00 12.2* 7,513.00  HGB 13.0 11.0* 9.9*  HCT 40 32.8* 29*  MCV  --  91.6  --   PLT 290 233 419*   Lab Results  Component Value Date   TSH 0.71 10/24/2021   Lab Results  Component Value Date   HGBA1C 7.2 11/26/2021   Lab Results  Component Value Date   CHOL 184 06/07/2021   HDL 58 06/07/2021   LDLCALC 111 06/07/2021   TRIG 68 06/07/2021   CHOLHDL 2.4 06/27/2018    Significant Diagnostic Results in last 30 days:  No results found.  Assessment/Plan 1. Weight loss - down 20 lbs in 3 months - BMI 18.25 - followed by dietary - Boost BID started 07/18 - start Remeron 7.5 po qhs - cont weekly weights  2. Chronic diastolic congestive heart failure (HCC) - compensated - cont furosemide  3. Essential hypertension - controlled with amlodipine and losartan  4. DM type 2 with diabetic peripheral neuropathy (HCC) - A1c 7.2 11/26/2021 - cont metformin, asa, ARB, statin  5. Cognitive impairment - no behaviors - ambulates with walker - cont AL care  6. CKD stage 3 due to type 2 diabetes mellitus ([place]) - avoid nephrotoxic drugs like NSAIDS and dose adjust medications to be renally excreted -encourage hydration with water      Family/ staff Communication: plan discussed with patient and nurse  Labs/tests ordered:  none

## 2022-01-27 ENCOUNTER — Non-Acute Institutional Stay: Payer: Medicare Other | Admitting: Nurse Practitioner

## 2022-01-27 ENCOUNTER — Encounter: Payer: Self-pay | Admitting: Nurse Practitioner

## 2022-01-27 DIAGNOSIS — R634 Abnormal weight loss: Secondary | ICD-10-CM | POA: Diagnosis not present

## 2022-01-27 DIAGNOSIS — I5032 Chronic diastolic (congestive) heart failure: Secondary | ICD-10-CM

## 2022-01-27 DIAGNOSIS — I10 Essential (primary) hypertension: Secondary | ICD-10-CM

## 2022-01-27 DIAGNOSIS — R4189 Other symptoms and signs involving cognitive functions and awareness: Secondary | ICD-10-CM | POA: Diagnosis not present

## 2022-01-27 DIAGNOSIS — E1122 Type 2 diabetes mellitus with diabetic chronic kidney disease: Secondary | ICD-10-CM

## 2022-01-27 DIAGNOSIS — N183 Chronic kidney disease, stage 3 unspecified: Secondary | ICD-10-CM

## 2022-01-27 DIAGNOSIS — I25118 Atherosclerotic heart disease of native coronary artery with other forms of angina pectoris: Secondary | ICD-10-CM | POA: Diagnosis not present

## 2022-01-27 DIAGNOSIS — E785 Hyperlipidemia, unspecified: Secondary | ICD-10-CM | POA: Diagnosis not present

## 2022-01-27 DIAGNOSIS — E0841 Diabetes mellitus due to underlying condition with diabetic mononeuropathy: Secondary | ICD-10-CM

## 2022-01-27 DIAGNOSIS — D649 Anemia, unspecified: Secondary | ICD-10-CM | POA: Insufficient documentation

## 2022-01-27 NOTE — Assessment & Plan Note (Signed)
takes Metformin, Hgb a1c 7.2 11/26/21

## 2022-01-27 NOTE — Progress Notes (Signed)
Location:   [facility] Room Number: 784 A Place of Service:  ALF [phone]) Provider:  Mast, Man X, NP  Mast, Man X, NP  Patient Care Team: Mast, Man X, NP as PCP - General (Internal Medicine) [place] Mast, Man X, NP as Nurse Practitioner (Nurse Practitioner) Jacelyn Pi, MD as Consulting Physician (Endocrinology) Calvert Cantor, MD as Consulting Physician (Ophthalmology) Vickey Huger, MD as Consulting Physician (Orthopedic Surgery) Suella Broad, MD as Consulting Physician (Physical Medicine and Rehabilitation) Erline Levine, MD as Consulting Physician (Neurosurgery) Juanita Craver, MD as Consulting Physician (Gastroenterology) Martinique, Peter M, MD as Consulting Physician (Cardiology) Megan Salon, MD as Consulting Physician (Gynecology)  Extended Emergency Contact Information Primary Emergency Contact: Ladene Artist of [place] Phone: [phone] Relation: Niece Secondary Emergency Contact: Edmon Crape Mobile Phone: [phone] Relation: Other Preferred language: Cleophus Molt Interpreter needed? No Guardian: Meryl Dare Mobile Phone: [phone] Relation: Legal Guardian Preferred language: English Interpreter needed? No  Code Status:  DNR Goals of care: Advanced Directive information    01/27/2022    3:33 PM  Advanced Directives  Does Patient Have a Medical Advance Directive? Yes  Type of Paramedic of [place];Out of facility DNR (pink MOST or yellow form);Living will  Does patient want to make changes to medical advance directive? No - Patient declined  Copy of [place] in Chart? Yes - validated most recent copy scanned in chart (See row information)  Pre-existing out of facility DNR order (yellow form or pink MOST form) Yellow form placed in chart (order not valid for inpatient use)     Chief Complaint  Patient presents with   Medical Management of Chronic  Issues    Routine follow up.   Immunizations    Shingrix vaccine due   Quality Metric Gaps    Eye exam due    HPI:  Pt is a 88 y.o. female seen today for medical management of chronic diseases.     Past Medical History:  Diagnosis Date   Contact dermatitis and other eczema, due to unspecified cause    Cortical senile cataract    GERD (gastroesophageal reflux disease) 02/17/2013   Hyperparathyroidism, unspecified ([place]) 03/26/2010   Irritable bowel syndrome    Keratoconjunctivitis sicca, not specified as Sjogren's    Loss of weight    Macular degeneration (senile) of retina, unspecified    Memory loss    Other and unspecified hyperlipidemia    Other malaise and fatigue 06/17/2010   Pain in joint, site unspecified    Pathologic fracture of vertebrae    Reflux esophagitis    Scoliosis (and kyphoscoliosis), idiopathic    Senile osteoporosis    Type II or unspecified type diabetes mellitus without mention of complication, uncontrolled    Unspecified essential hypertension    Unspecified hereditary and idiopathic peripheral neuropathy    Unspecified vitamin D deficiency    Past Surgical History:  Procedure Laterality Date   APPENDECTOMY     BREAST LUMPECTOMY  02/05/2010   Dr. Gershon Mussel Cornett   CATARACT EXTRACTION W/ INTRAOCULAR LENS  IMPLANT, BILATERAL  04/2013   Dr. Bing Plume   CHOLECYSTECTOMY  2004   Dr. Zella Richer   COLONOSCOPY  2001   Dr. Collene Mares   fibroidectomy  1950   HEMORRHOID SURGERY  1978   KYPHOPLASTY  2010   T10 & T12 Dr. Erline Levine   MEDIAL PARTIAL KNEE REPLACEMENT Left 2004   Dr. Ronnie Derby   SPINE SURGERY  vertebroplasty T10, T12   SPINE SURGERY  11/02/2001    C4-5 & C5-6 Titanium Plate Placed Dr. Carloyn Manner   THYROIDECTOMY, PARTIAL Left 2007   TOTAL KNEE ARTHROPLASTY Right 2009   Dr. Alvan Dame     Allergies  Allergen Reactions   Bactrim     unknown   Betadine [Povidone Iodine]     unknown   Metformin And Related     Hurt stomach   Nutmeg Oil (Myristica Oil)      unknown   Sulfa Antibiotics     unknown    Allergies as of 01/27/2022       Reactions   Bactrim    unknown   Betadine [povidone Iodine]    unknown   Metformin And Related    Hurt stomach   Nutmeg Oil (myristica Oil)    unknown   Sulfa Antibiotics    unknown        Medication List        Accurate as of January 27, 2022  3:34 PM. If you have any questions, ask your nurse or doctor.          acetaminophen 325 MG tablet Commonly known as: TYLENOL Take 650 mg by mouth every 6 (six) hours as needed for mild pain. Three times a day   acetaminophen 325 MG tablet Commonly known as: TYLENOL Take 650 mg by mouth as needed.   amLODipine 5 MG tablet Commonly known as: NORVASC Take 5 mg by mouth daily.   aspirin EC 81 MG tablet Take 1 tablet (81 mg total) by mouth daily.   atorvastatin 10 MG tablet Commonly known as: LIPITOR Take 10 mg by mouth daily.   betamethasone dipropionate 0.05 % ointment Commonly known as: DIPROLENE Apply topically. Apply BID PRN to active rash on extremities . 1 app immediately after shower. Twice A Day - PRN   diclofenac Sodium 1 % Gel Commonly known as: VOLTAREN Apply 2 g topically 3 (three) times daily as needed (knee pain). Apply to both knees   furosemide 20 MG tablet Commonly known as: LASIX Take 20 mg by mouth. Once A Day on Mon, Wed, Fri   losartan 50 MG tablet Commonly known as: Cozaar Take 1 tablet (50 mg total) by mouth daily.   metFORMIN 500 MG 24 hr tablet Commonly known as: GLUCOPHAGE-XR Take 500 mg by mouth 2 (two) times daily. Take 1/2 tablet   metoprolol tartrate 25 MG tablet Commonly known as: LOPRESSOR Take 12.5 mg by mouth 2 (two) times daily. Hold Metoprolol for SBP < 110 and DSP <50   multivitamins ther. w/minerals Tabs tablet Take 1 tablet by mouth daily. 400 mcg tablet   nitroGLYCERIN 0.4 MG SL tablet Commonly known as: NITROSTAT Place 0.4 mg under the tongue every 5 (five) minutes as needed for chest  pain. PLACE 0.4 MG UNDER TONGUE EVERY 5 MINUTES X3 FOR CHEST PAIN, IF NO RELIEF, CALL MD OR SEND TO ED FOR FURTHER EVALUATION.   oxyCODONE-acetaminophen 5-325 MG tablet Commonly known as: PERCOCET/ROXICET Take 1 tablet by mouth every 6 (six) hours as needed for severe pain.   potassium chloride 10 MEQ CR capsule Commonly known as: MICRO-K Take 10 mEq by mouth. Once A Day on Mon, Wed, Fri        Review of Systems  Immunization History  Administered Date(s) Administered   Influenza Whole 03/30/2012, 04/13/2013, 04/04/2018   Influenza, High Dose Seasonal PF 04/02/2019   Influenza-Unspecified 04/17/2014, 03/29/2015, 04/18/2021   Moderna SARS-COV2 Booster Vaccination  05/08/2020   Moderna Sars-Covid-2 Vaccination 07/02/2019, 07/30/2019, 11/27/2020   PFIZER Comirnaty(Gray Top)Covid-19 Tri-Sucrose Vaccine 03/19/2021   Pneumococcal Conjugate-13 12/08/2017   Pneumococcal Polysaccharide-23 06/30/2001   Td 06/30/2001, 12/08/2017   Pertinent  Health Maintenance Due  Topic Date Due   OPHTHALMOLOGY EXAM  04/02/2016   INFLUENZA VACCINE  01/28/2022   HEMOGLOBIN A1C  05/29/2022   FOOT EXAM  10/23/2022   DEXA SCAN  Completed      11/12/2020    2:00 AM 11/12/2020    7:45 AM 11/13/2020    2:00 AM 11/13/2020   10:00 AM 11/05/2021    7:37 PM  Fall Risk  Patient Fall Risk Level Moderate fall risk Moderate fall risk Moderate fall risk Moderate fall risk High fall risk   Functional Status Survey:    Vitals:   01/27/22 1520  BP: 118/70  Pulse: 72  Resp: 18  Temp: 97.8 F (36.6 C)  SpO2: 98%  Weight: 100 lb 3.2 oz (45.5 kg)  Height: '5\' 2"'$  (1.575 m)   Body mass index is 18.33 kg/m. Physical Exam  Labs reviewed: Recent Labs    10/24/21 0000 11/05/21 2039 11/15/21 0000  NA 139 136 136*  K 4.4 4.8 4.4  CL 103 104 104  CO2 30* 24 19  GLUCOSE  --  296*  --   BUN 20 20 25*  CREATININE 1.0 1.24* 1.1  CALCIUM 10.8* 10.7* 10.3   Recent Labs    06/07/21 0000 10/24/21 0000  11/05/21 2039  AST '17 19 27  '$ ALT '10 13 19  '$ ALKPHOS 47 60 52  BILITOT  --   --  0.8  PROT  --   --  6.0*  ALBUMIN 3.4* 4.0 3.5   Recent Labs    10/24/21 0000 11/05/21 2039 11/15/21 0000  WBC 9.6 15.7* 11.4  NEUTROABS 5,002.00 12.2* 7,513.00  HGB 13.0 11.0* 9.9*  HCT 40 32.8* 29*  MCV  --  91.6  --   PLT 290 233 419*   Lab Results  Component Value Date   TSH 0.71 10/24/2021   Lab Results  Component Value Date   HGBA1C 7.2 11/26/2021   Lab Results  Component Value Date   CHOL 184 06/07/2021   HDL 58 06/07/2021   LDLCALC 111 06/07/2021   TRIG 68 06/07/2021   CHOLHDL 2.4 06/27/2018    Significant Diagnostic Results in last 30 days:  No results found.  Assessment/Plan There are no diagnoses linked to this encounter.   Family/ staff Communication:   Labs/tests ordered:

## 2022-01-27 NOTE — Assessment & Plan Note (Signed)
Hgb 9.9 11/15/21, update CBC/diff.

## 2022-01-27 NOTE — Assessment & Plan Note (Signed)
Ca 11.7>>9.9(corrected to 10.7, PTH 35, s/p Zometa x1, PTHrP <2.0 11/11/20,  Hyperparathyroidism, workup with Dr. Chalmers Cater, Ca 10.3 11/15/21

## 2022-01-27 NOTE — Progress Notes (Addendum)
Location:   [facility] Room Number: 103 A Place of Service:  ALF (13) Provider: Lennie Odor Saajan Willmon NP  Demetrick Eichenberger X, NP  Patient Care Team: Locklan Canoy X, NP as PCP - General (Internal Medicine) [place] Chee Kinslow X, NP as Nurse Practitioner (Nurse Practitioner) Jacelyn Pi, MD as Consulting Physician (Endocrinology) Calvert Cantor, MD as Consulting Physician (Ophthalmology) Vickey Huger, MD as Consulting Physician (Orthopedic Surgery) Suella Broad, MD as Consulting Physician (Physical Medicine and Rehabilitation) Erline Levine, MD as Consulting Physician (Neurosurgery) Juanita Craver, MD as Consulting Physician (Gastroenterology) Martinique, Peter M, MD as Consulting Physician (Cardiology) Megan Salon, MD as Consulting Physician (Gynecology)  Extended Emergency Contact Information Primary Emergency Contact: Ladene Artist of [place] Phone: [phone] Relation: Niece Secondary Emergency Contact: Edmon Crape Mobile Phone: [phone] Relation: Other Preferred language: Cleophus Molt Interpreter needed? No Guardian: Meryl Dare Mobile Phone: [phone] Relation: Legal Guardian Preferred language: English Interpreter needed? No  Code Status:  DNR Goals of care: Advanced Directive information    01/27/2022    3:33 PM  Advanced Directives  Does Patient Have a Medical Advance Directive? Yes  Type of Paramedic of [place];Out of facility DNR (pink MOST or yellow form);Living will  Does patient want to make changes to medical advance directive? No - Patient declined  Copy of [place] in Chart? Yes - validated most recent copy scanned in chart (See row information)  Pre-existing out of facility DNR order (yellow form or pink MOST form) Yellow form placed in chart (order not valid for inpatient use)     Chief Complaint  Patient presents with  . Medical Management of Chronic Issues    Routine  follow up.  . Immunizations    Shingrix vaccine due  . Quality Metric Gaps    Eye exam due    HPI:  Pt is a 88 y.o. female seen today for medical management of chronic diseases.     Hypercalcemia, Ca 11.7>>9.9(corrected to 10.7, PTH 35, s/p Zometa x1, PTHrP <2.0 11/11/20,  Hyperparathyroidism, workup with Dr. Chalmers Cater, Ca 10.3 11/15/21             T2DM, takes Metformin, Hgb a1c 7.2 11/26/21             HTN, takes Losartan, Metoprolol, Amlodipine. Bun/creat 22/1.08 06/06/21             CKD stage 3 Bun/creat 25/1.1 11/15/21             CHF, chronic edema BLE, 04/16/21 Echo EF 83-33%, diastolic dysfunction. on Furosemide             Cognitive impairment, at her baseline.   Weight loss, placed on Mirtazapine 7.$RemoveBeforeDE'5mg'hsLiHCFTFFmUJPL$  qd 01/15/22, too soon to eval. TSH 0.71 10/24/21  Hyperlipidemia, takes Atorvastatin, LDL 111 06/07/21  Anemia, Hgb 9.9 11/15/21  Past Medical History:  Diagnosis Date  . Contact dermatitis and other eczema, due to unspecified cause   . Cortical senile cataract   . GERD (gastroesophageal reflux disease) 02/17/2013  . Hyperparathyroidism, unspecified ([place]) 03/26/2010  . Irritable bowel syndrome   . Keratoconjunctivitis sicca, not specified as Sjogren's   . Loss of weight   . Macular degeneration (senile) of retina, unspecified   . Memory loss   . Other and unspecified hyperlipidemia   . Other malaise and fatigue 06/17/2010  . Pain in joint, site unspecified   . Pathologic fracture of vertebrae   . Reflux esophagitis   .  Scoliosis (and kyphoscoliosis), idiopathic   . Senile osteoporosis   . Type II or unspecified type diabetes mellitus without mention of complication, uncontrolled   . Unspecified essential hypertension   . Unspecified hereditary and idiopathic peripheral neuropathy   . Unspecified vitamin D deficiency    Past Surgical History:  Procedure Laterality Date  . APPENDECTOMY    . BREAST LUMPECTOMY  02/05/2010   Dr. Christie Beckers  . CATARACT EXTRACTION W/ INTRAOCULAR LENS   IMPLANT, BILATERAL  04/2013   Dr. Bing Plume  . CHOLECYSTECTOMY  2004   Dr. Zella Richer  . COLONOSCOPY  2001   Dr. Collene Mares  . fibroidectomy  1950  . [place]  . KYPHOPLASTY  2010   T10 & T12 Dr. Erline Levine  . MEDIAL PARTIAL KNEE REPLACEMENT Left 2004   Dr. Ronnie Derby  . SPINE SURGERY     vertebroplasty T10, T12  . SPINE SURGERY  11/02/2001    C4-5 & C5-6 Titanium Plate Placed Dr. Carloyn Manner  . THYROIDECTOMY, PARTIAL Left 2007  . TOTAL KNEE ARTHROPLASTY Right 2009   Dr. Alvan Dame     Allergies  Allergen Reactions  . Bactrim     unknown  . Betadine [Povidone Iodine]     unknown  . Metformin And Related     Hurt stomach  . Nutmeg Oil (Myristica Oil)     unknown  . Sulfa Antibiotics     unknown    Allergies as of 01/27/2022       Reactions   Bactrim    unknown   Betadine [povidone Iodine]    unknown   Metformin And Related    Hurt stomach   Nutmeg Oil (myristica Oil)    unknown   Sulfa Antibiotics    unknown        Medication List        Accurate as of January 27, 2022 11:59 PM. If you have any questions, ask your nurse or doctor.          acetaminophen 325 MG tablet Commonly known as: TYLENOL Take 650 mg by mouth every 6 (six) hours as needed for mild pain. Three times a day   acetaminophen 325 MG tablet Commonly known as: TYLENOL Take 650 mg by mouth as needed.   amLODipine 5 MG tablet Commonly known as: NORVASC Take 5 mg by mouth daily.   aspirin EC 81 MG tablet Take 1 tablet (81 mg total) by mouth daily.   atorvastatin 10 MG tablet Commonly known as: LIPITOR Take 10 mg by mouth daily.   betamethasone dipropionate 0.05 % ointment Commonly known as: DIPROLENE Apply topically. Apply BID PRN to active rash on extremities . 1 app immediately after shower. Twice A Day - PRN   diclofenac Sodium 1 % Gel Commonly known as: VOLTAREN Apply 2 g topically 3 (three) times daily as needed (knee pain). Apply to both knees   furosemide 20 MG tablet Commonly  known as: LASIX Take 20 mg by mouth. Once A Day on Mon, Wed, Fri   losartan 50 MG tablet Commonly known as: Cozaar Take 1 tablet (50 mg total) by mouth daily.   metFORMIN 500 MG 24 hr tablet Commonly known as: GLUCOPHAGE-XR Take 500 mg by mouth 2 (two) times daily. Take 1/2 tablet   metoprolol tartrate 25 MG tablet Commonly known as: LOPRESSOR Take 12.5 mg by mouth 2 (two) times daily. Hold Metoprolol for SBP < 110 and DSP <50   multivitamins ther. w/minerals Tabs tablet Take 1  tablet by mouth daily. 400 mcg tablet   nitroGLYCERIN 0.4 MG SL tablet Commonly known as: NITROSTAT Place 0.4 mg under the tongue every 5 (five) minutes as needed for chest pain. PLACE 0.4 MG UNDER TONGUE EVERY 5 MINUTES X3 FOR CHEST PAIN, IF NO RELIEF, CALL MD OR SEND TO ED FOR FURTHER EVALUATION.   oxyCODONE-acetaminophen 5-325 MG tablet Commonly known as: PERCOCET/ROXICET Take 1 tablet by mouth every 6 (six) hours as needed for severe pain.   potassium chloride 10 MEQ CR capsule Commonly known as: MICRO-K Take 10 mEq by mouth. Once A Day on Mon, Wed, Fri        Review of Systems  Constitutional:  Positive for appetite change, fatigue and unexpected weight change. Negative for fever.  HENT:  Positive for hearing loss. Negative for congestion and trouble swallowing.   Respiratory:  Negative for cough and shortness of breath.   Cardiovascular:  Positive for leg swelling. Negative for chest pain and palpitations.  Gastrointestinal: Negative.  Negative for abdominal distention, abdominal pain, constipation, diarrhea, nausea, rectal pain and vomiting.       Urge incontinent of bowel.   Genitourinary:  Negative for dysuria and urgency.  Musculoskeletal:  Positive for arthralgias and gait problem.       Left knee pain when walking too much.   Skin:  Negative for color change.  Neurological:  Negative for speech difficulty, weakness and light-headedness.  Psychiatric/Behavioral:  Negative for confusion  and sleep disturbance. The patient is not nervous/anxious.     Immunization History  Administered Date(s) Administered  . Influenza Whole 03/30/2012, 04/13/2013, 04/04/2018  . Influenza, High Dose Seasonal PF 04/02/2019  . Influenza-Unspecified 04/17/2014, 03/29/2015, 04/18/2021  . Moderna SARS-COV2 Booster Vaccination 05/08/2020  . Moderna Sars-Covid-2 Vaccination 07/02/2019, 07/30/2019, 11/27/2020  . PFIZER Comirnaty(Gray Top)Covid-19 Tri-Sucrose Vaccine 03/19/2021  . Pneumococcal Conjugate-13 12/08/2017  . Pneumococcal Polysaccharide-23 06/30/2001  . Td 06/30/2001, 12/08/2017   Pertinent  Health Maintenance Due  Topic Date Due  . OPHTHALMOLOGY EXAM  04/02/2016  . INFLUENZA VACCINE  01/28/2022  . HEMOGLOBIN A1C  05/29/2022  . FOOT EXAM  10/23/2022  . DEXA SCAN  Completed      11/12/2020    2:00 AM 11/12/2020    7:45 AM 11/13/2020    2:00 AM 11/13/2020   10:00 AM 11/05/2021    7:37 PM  Fall Risk  Patient Fall Risk Level Moderate fall risk Moderate fall risk Moderate fall risk Moderate fall risk High fall risk   Functional Status Survey:    Vitals:   01/27/22 1520  BP: 118/70  Pulse: 72  Resp: 18  Temp: 97.8 F (36.6 C)  SpO2: 98%  Weight: 100 lb 3.2 oz (45.5 kg)  Height: $Remove'5\' 2"'NtKpdbL$  (1.575 m)   Body mass index is 18.33 kg/m. Physical Exam Vitals and nursing note reviewed.  Constitutional:      Appearance: Normal appearance.  HENT:     Head: Normocephalic and atraumatic.     Mouth/Throat:     Mouth: Mucous membranes are moist.  Eyes:     Extraocular Movements: Extraocular movements intact.     Conjunctiva/sclera: Conjunctivae normal.     Pupils: Pupils are equal, round, and reactive to light.  Cardiovascular:     Rate and Rhythm: Normal rate and regular rhythm.     Heart sounds: No murmur heard.    Comments: DP pulses present R+L Pulmonary:     Effort: Pulmonary effort is normal.     Breath sounds: No rales.  Abdominal:     General: Bowel sounds are normal.  There is no distension.     Palpations: Abdomen is soft.     Tenderness: There is no abdominal tenderness. There is no right CVA tenderness, left CVA tenderness, guarding or rebound.  Genitourinary:    Comments: External hemorrhoids, no injury or bleeding.  Musculoskeletal:     Cervical back: Normal range of motion and neck supple.     Right lower leg: Edema present.     Left lower leg: Edema present.     Comments: Kyphoscoliosis. Minimal edema BLE  Skin:    General: Skin is warm and dry.     Comments: Yellow, thick, long toenails.   Neurological:     General: No focal deficit present.     Mental Status: She is alert. Mental status is at baseline.     Motor: No weakness.     Coordination: Coordination normal.     Gait: Gait abnormal.     Comments: Oriented to person, place.   Psychiatric:        Mood and Affect: Mood normal.        Behavior: Behavior normal.        Thought Content: Thought content normal.        Judgment: Judgment normal.    Labs reviewed: Recent Labs    10/24/21 0000 11/05/21 2039 11/15/21 0000  NA 139 136 136*  K 4.4 4.8 4.4  CL 103 104 104  CO2 30* 24 19  GLUCOSE  --  296*  --   BUN 20 20 25*  CREATININE 1.0 1.24* 1.1  CALCIUM 10.8* 10.7* 10.3   Recent Labs    06/07/21 0000 10/24/21 0000 11/05/21 2039  AST $Re'17 19 27  'JyC$ ALT $R'10 13 19  'px$ ALKPHOS 47 60 52  BILITOT  --   --  0.8  PROT  --   --  6.0*  ALBUMIN 3.4* 4.0 3.5   Recent Labs    10/24/21 0000 11/05/21 2039 11/15/21 0000  WBC 9.6 15.7* 11.4  NEUTROABS 5,002.00 12.2* 7,513.00  HGB 13.0 11.0* 9.9*  HCT 40 32.8* 29*  MCV  --  91.6  --   PLT 290 233 419*   Lab Results  Component Value Date   TSH 0.71 10/24/2021   Lab Results  Component Value Date   HGBA1C 7.2 11/26/2021   Lab Results  Component Value Date   CHOL 184 06/07/2021   HDL 58 06/07/2021   LDLCALC 111 06/07/2021   TRIG 68 06/07/2021   CHOLHDL 2.4 06/27/2018    Significant Diagnostic Results in last 30 days:  No  results found.  Assessment/Plan  Hyperlipidemia  takes Atorvastatin, LDL 111 06/07/21, update lipid panel.   Weight loss placed on Mirtazapine 7.$RemoveBeforeDE'5mg'owRsqcPQJwknDgN$  qd 01/15/22, too soon to eval. TSH 0.71 10/24/21, update TSH  Cognitive impairment at her baseline, functional in [facility]  Diastolic CHF (HCC)  chronic edema BLE, 04/16/21 Echo EF 09-81%, diastolic dysfunction. on Furosemide  CKD stage 3 due to type 2 diabetes mellitus ([place]) Bun/creat 25/1.1 11/15/21, update CMP/eGFR  Essential hypertension takes Losartan, Metoprolol, Amlodipine. Bun/creat 22/1.08 06/06/21  Diabetic neuropathy takes Metformin, Hgb a1c 7.2 11/26/21  Hypercalcemia Ca 11.7>>9.9(corrected to 10.7, PTH 35, s/p Zometa x1, PTHrP <2.0 11/11/20,  Hyperparathyroidism, workup with Dr. Chalmers Cater, Ca 10.3 11/15/21  Anemia Hgb 9.9 11/15/21, update CBC/diff.    Family/ staff Communication: plan of care reviewed with the patient and charge nurse.   Labs/tests ordered:  CBC/diff, CMP/eGFR,  TSH, lipid panel.  Time spend 40 minutes.

## 2022-01-27 NOTE — Assessment & Plan Note (Signed)
chronic edema BLE, 04/16/21 Echo EF 40-45%, diastolic dysfunction. on Furosemide 

## 2022-01-27 NOTE — Assessment & Plan Note (Signed)
Bun/creat 25/1.1 11/15/21, update CMP/eGFR

## 2022-01-27 NOTE — Assessment & Plan Note (Signed)
takes Losartan, Metoprolol, Amlodipine. Bun/creat 22/1.08 06/06/21

## 2022-01-27 NOTE — Assessment & Plan Note (Signed)
at her baseline, functional in [facility]

## 2022-01-27 NOTE — Assessment & Plan Note (Signed)
placed on Mirtazapine 7.'5mg'$  qd 01/15/22, too soon to eval. TSH 0.71 10/24/21, update TSH

## 2022-01-27 NOTE — Assessment & Plan Note (Signed)
takes Atorvastatin, LDL 111 06/07/21, update lipid panel.

## 2022-01-30 LAB — BASIC METABOLIC PANEL
BUN: 27 — AB (ref 4–21)
CO2: 22 (ref 13–22)
Chloride: 111 — AB (ref 99–108)
Creatinine: 1.2 — AB (ref 0.5–1.1)
Glucose: 72
Potassium: 5.1 mEq/L (ref 3.5–5.1)
Sodium: 139 (ref 137–147)

## 2022-01-30 LAB — COMPREHENSIVE METABOLIC PANEL
Albumin: 3.5 (ref 3.5–5.0)
Calcium: 10.3 (ref 8.7–10.7)
Globulin: 1.8
eGFR: 44

## 2022-01-30 LAB — CBC AND DIFFERENTIAL
HCT: 30 — AB (ref 36–46)
Hemoglobin: 10.4 — AB (ref 12.0–16.0)
Neutrophils Absolute: 3423
WBC: 7

## 2022-01-30 LAB — LIPID PANEL
Cholesterol: 105 (ref 0–200)
HDL: 48 (ref 35–70)
LDL Cholesterol: 39
Triglycerides: 94 (ref 40–160)

## 2022-01-30 LAB — HEPATIC FUNCTION PANEL
ALT: 12 U/L (ref 7–35)
AST: 17 (ref 13–35)
Alkaline Phosphatase: 58 (ref 25–125)
Bilirubin, Total: 0.3

## 2022-01-30 LAB — TSH: TSH: 0.65 (ref 0.41–5.90)

## 2022-01-30 LAB — CBC: RBC: 3.17 — AB (ref 3.87–5.11)

## 2022-04-01 ENCOUNTER — Non-Acute Institutional Stay: Payer: Medicare Other | Admitting: Family Medicine

## 2022-04-01 ENCOUNTER — Encounter: Payer: Self-pay | Admitting: Family Medicine

## 2022-04-01 DIAGNOSIS — R4189 Other symptoms and signs involving cognitive functions and awareness: Secondary | ICD-10-CM | POA: Diagnosis not present

## 2022-04-01 DIAGNOSIS — I25118 Atherosclerotic heart disease of native coronary artery with other forms of angina pectoris: Secondary | ICD-10-CM

## 2022-04-01 DIAGNOSIS — N183 Chronic kidney disease, stage 3 unspecified: Secondary | ICD-10-CM

## 2022-04-01 DIAGNOSIS — E1122 Type 2 diabetes mellitus with diabetic chronic kidney disease: Secondary | ICD-10-CM | POA: Diagnosis not present

## 2022-04-01 DIAGNOSIS — E0841 Diabetes mellitus due to underlying condition with diabetic mononeuropathy: Secondary | ICD-10-CM

## 2022-04-01 DIAGNOSIS — I10 Essential (primary) hypertension: Secondary | ICD-10-CM

## 2022-04-01 DIAGNOSIS — K219 Gastro-esophageal reflux disease without esophagitis: Secondary | ICD-10-CM

## 2022-04-01 NOTE — Progress Notes (Signed)
No chief complaint on file.   HPI This is a routine assisted living visit [facility] for management of chronic problems including diabetes mellitus, some cognitive decline, congestive heart failure, weight loss, and hyperlipidemia. She has no new complaints today.  Reports that her appetite is good related to the food.  She did show me a candy bar that she is eating today in spite of her diabetes.  Cognitively, she did not recall what she ate for supper last night. 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 She takes metformin for her diabetes and most recent A1c was 7.2.  Blood pressure is controlled with combination losartan and metoprolol.  She takes atorvastatin for her lipids.  Last LDL was 111  Past Medical History:  Diagnosis Date   Contact dermatitis and other eczema, due to unspecified cause    Cortical senile cataract    GERD (gastroesophageal reflux disease) 02/17/2013   Hyperparathyroidism, unspecified ([place]) 03/26/2010   Irritable bowel syndrome    Keratoconjunctivitis sicca, not specified as Sjogren's    Loss of weight    Macular degeneration (senile) of retina, unspecified    Memory loss    Other and unspecified hyperlipidemia    Other malaise and fatigue 06/17/2010   Pain in joint, site unspecified    Pathologic fracture of vertebrae    Reflux esophagitis    Scoliosis (and kyphoscoliosis), idiopathic    Senile osteoporosis    Type II or unspecified type diabetes mellitus without mention of complication, uncontrolled    Unspecified essential hypertension    Unspecified hereditary and idiopathic peripheral neuropathy    Unspecified vitamin D deficiency     Social History   Socioeconomic History   Marital status: Widowed    Spouse name: Not on file   Number of children: 0   Years of education: Not on file   Highest education level: Not on file  Occupational History   Occupation: retired Marketing executive:  RETIRED  Tobacco Use   Smoking status: Never   Smokeless tobacco: Never  Vaping Use   Vaping Use: Never used  Substance and Sexual Activity   Alcohol use: No   Drug use: No   Sexual activity: Never    Partners: Male    Birth control/protection: Post-menopausal  Other Topics Concern   Not on file  Social History Narrative   Lives at [facility] since 02/06/2009   Widowed    No childred   Living Will   Social Determinants of Health   Financial Resource Strain: [place]  (11/24/2017)   Overall Financial Resource Strain (CARDIA)    Difficulty of Paying Living Expenses: Not hard at all  Food Insecurity: No Food Insecurity (11/24/2017)   Hunger Vital Sign    Worried About Running Out of Food in the Last Year: Never true    [place] in the Last Year: Never true  Transportation Needs: No Transportation Needs (11/24/2017)   PRAPARE - Hydrologist (Medical): No    Lack of Transportation (Non-Medical): No  Physical Activity: Insufficiently Active (11/24/2017)   Exercise Vital Sign    Days of Exercise per Week: 7 days    Minutes of Exercise per Session: 20 min  Stress: No Stress Concern Present (11/24/2017)   [place]    Feeling of Stress : Only a little  Social Connections: Moderately Isolated (11/24/2017)  Social Licensed conveyancer [NHANES]    Frequency of Communication with Friends and Family: More than three times a week    Frequency of Social Gatherings with Friends and Family: More than three times a week    Attends Religious Services: Never    Marine scientist or Organizations: No    Attends Archivist Meetings: Never    Marital Status: Widowed  Intimate Partner Violence: Not At Risk (11/24/2017)   Humiliation, Afraid, Rape, and Kick questionnaire    Fear of Current or Ex-Partner: No    Emotionally Abused: No    Physically Abused: No     Sexually Abused: No    Family History  Problem Relation Age of Onset   Diabetes Mother    Heart disease Mother        CHF   Stroke Father    Heart disease Sister        MI   Colon cancer Neg Hx    Stomach cancer Neg Hx    Esophageal cancer Neg Hx    Rectal cancer Neg Hx    Liver cancer Neg Hx     Review of Systems  Constitutional:  Positive for appetite change and unexpected weight change.  Eyes: Negative.   Respiratory: Negative.    Cardiovascular:  Positive for leg swelling.  Gastrointestinal: Negative.   Genitourinary: Negative.   Psychiatric/Behavioral:  Positive for confusion.   All other systems reviewed and are negative.   Physical Exam Vitals and nursing note reviewed.  Constitutional:      Appearance: Normal appearance.  HENT:     Head: Normocephalic.  Cardiovascular:     Rate and Rhythm: Normal rate and regular rhythm.     Heart sounds: Normal heart sounds.  Pulmonary:     Effort: Pulmonary effort is normal.     Breath sounds: Normal breath sounds.  Musculoskeletal:     Left lower leg: Edema present.     Comments: There is a new sore on the lateral aspect of her left ankle.  Given her diabetes I would like to treat this with topical antibiotic  Neurological:     General: No focal deficit present.     Mental Status: She is alert and oriented to person, place, and time.    Assessment:1. CKD stage 3 due to type 2 diabetes mellitus (HCC) Creatinine and BUN were 1.1 and 25 respectively 4 months ago  2. Cognitive impairment Functions well in this environment.  Independent with ADLs  3. Diabetic mononeuropathy associated with diabetes mellitus due to underlying condition ([place]) Takes only metformin and A1c is at goal for her age  88. Essential hypertension Blood pressure 118/70 pulse 72.  Continue same metoprolol, amlodipine, and losartan    Lillette Boxer. [place], [place] [address] [place], [place] Office [phone]

## 2022-04-02 NOTE — Addendum Note (Signed)
Addended by: Wardell Honour on: 04/02/2022 02:40 PM   Modules accepted: Level of Service

## 2022-06-16 ENCOUNTER — Non-Acute Institutional Stay: Payer: Medicare Other | Admitting: Nurse Practitioner

## 2022-06-16 ENCOUNTER — Encounter: Payer: Self-pay | Admitting: Nurse Practitioner

## 2022-06-16 DIAGNOSIS — I10 Essential (primary) hypertension: Secondary | ICD-10-CM | POA: Diagnosis not present

## 2022-06-16 DIAGNOSIS — R4189 Other symptoms and signs involving cognitive functions and awareness: Secondary | ICD-10-CM

## 2022-06-16 DIAGNOSIS — I25118 Atherosclerotic heart disease of native coronary artery with other forms of angina pectoris: Secondary | ICD-10-CM | POA: Diagnosis not present

## 2022-06-16 DIAGNOSIS — E1122 Type 2 diabetes mellitus with diabetic chronic kidney disease: Secondary | ICD-10-CM

## 2022-06-16 DIAGNOSIS — N183 Chronic kidney disease, stage 3 unspecified: Secondary | ICD-10-CM

## 2022-06-16 DIAGNOSIS — E785 Hyperlipidemia, unspecified: Secondary | ICD-10-CM

## 2022-06-16 DIAGNOSIS — I5032 Chronic diastolic (congestive) heart failure: Secondary | ICD-10-CM

## 2022-06-16 DIAGNOSIS — R634 Abnormal weight loss: Secondary | ICD-10-CM

## 2022-06-16 NOTE — Assessment & Plan Note (Signed)
Ca 11.7>>9.9(corrected to 10.7, PTH 35, s/p Zometa x1, PTHrP <2.0 11/11/20,  Hyperparathyroidism, workup with Dr. Chalmers Cater, Ca 10.3 01/30/22

## 2022-06-16 NOTE — Assessment & Plan Note (Signed)
takes Atorvastatin, LDL 39 01/30/22

## 2022-06-16 NOTE — Assessment & Plan Note (Signed)
stable, on Mirtazapine 7.'5mg'$  qd 01/15/22, too soon to eval. TSH 0.65 01/30/22

## 2022-06-16 NOTE — Assessment & Plan Note (Signed)
chronic edema BLE, 04/16/21 Echo EF 65-79%, diastolic dysfunction. on Furosemide

## 2022-06-16 NOTE — Assessment & Plan Note (Signed)
Hgb 10.4 01/30/22

## 2022-06-16 NOTE — Assessment & Plan Note (Signed)
at her baseline.

## 2022-06-16 NOTE — Assessment & Plan Note (Signed)
takes Losartan, Metoprolol, Amlodipine.

## 2022-06-16 NOTE — Progress Notes (Signed)
Location:   [facility] Room Number: 885 A Place of Service:  ALF (13) Provider:  Jackey Housey Darlina Rumpf, NP  Shalise Rosado X, NP  Patient Care Team: Catheryn Slifer X, NP as PCP - General (Internal Medicine) [place], [facility] Kadian Barcellos X, NP as Nurse Practitioner (Nurse Practitioner) Jacelyn Pi, MD as Consulting Physician (Endocrinology) Calvert Cantor, MD as Consulting Physician (Ophthalmology) Vickey Huger, MD as Consulting Physician (Orthopedic Surgery) Suella Broad, MD as Consulting Physician (Physical Medicine and Rehabilitation) Erline Levine, MD as Consulting Physician (Neurosurgery) Juanita Craver, MD as Consulting Physician (Gastroenterology) [place], Peter M, MD as Consulting Physician (Cardiology) Megan Salon, MD as Consulting Physician (Gynecology)  Extended Emergency Contact Information Primary Emergency Contact: Ladene Artist of [place] Phone: [phone] Relation: Niece Secondary Emergency Contact: Edmon Crape Mobile Phone: [phone] Relation: Other Preferred language: Cleophus Molt Interpreter needed? No Guardian: Meryl Dare Mobile Phone: [phone] Relation: Legal Guardian Preferred language: English Interpreter needed? No  Code Status:  DNR Goals of care: Advanced Directive information    06/17/2022    9:10 AM  Advanced Directives  Does Patient Have a Medical Advance Directive? Yes  Type of Paramedic of [place];Living will;Out of facility DNR (pink MOST or yellow form)  Copy of [place] in Chart? Yes - validated most recent copy scanned in chart (See row information)  Pre-existing out of facility DNR order (yellow form or pink MOST form) Yellow form placed in chart (order not valid for inpatient use)     Chief Complaint  Patient presents with   Medical Management of Chronic Issues    Routine follow up visit   Immunizations    Shingrix, flu,and current COVID  vaccine due.   Quality Metric Gaps    Eye exam, Hemoglobin A1C and medicare annual wellness visit due.    HPI:  Pt is a 88 y.o. female seen today for medical management of chronic diseases.     Hypercalcemia, Ca 11.7>>9.9(corrected to 10.7, PTH 35, s/p Zometa x1, PTHrP <2.0 11/11/20,  Hyperparathyroidism, workup with Dr. Chalmers Cater, Ca 10.3 01/30/22             T2DM, takes Metformin, Hgb a1c 7.2 11/26/21             HTN, takes Losartan, Metoprolol, Amlodipine.              CKD stage 3 Bun/creat 27/1.2 01/30/22             CHF, chronic edema BLE, 04/16/21 Echo EF 76-54%, diastolic dysfunction. on Furosemide             Cognitive impairment, at her baseline.              Weight loss, stable, on Mirtazapine 7.'5mg'$  qd 01/15/22, too soon to eval. TSH 0.65 01/30/22             Hyperlipidemia, takes Atorvastatin, LDL 39 01/30/22             Anemia, Hgb 10.4 01/30/22  Past Medical History:  Diagnosis Date   Contact dermatitis and other eczema, due to unspecified cause    Cortical senile cataract    GERD (gastroesophageal reflux disease) 02/17/2013   Hyperparathyroidism, unspecified ([place]) 03/26/2010   Irritable bowel syndrome    Keratoconjunctivitis sicca, not specified as Sjogren's    Loss of weight    Macular degeneration (senile) of retina, unspecified    Memory loss    Other and unspecified hyperlipidemia  Other malaise and fatigue 06/17/2010   Pain in joint, site unspecified    Pathologic fracture of vertebrae    Reflux esophagitis    Scoliosis (and kyphoscoliosis), idiopathic    Senile osteoporosis    Type II or unspecified type diabetes mellitus without mention of complication, uncontrolled    Unspecified essential hypertension    Unspecified hereditary and idiopathic peripheral neuropathy    Unspecified vitamin D deficiency    Past Surgical History:  Procedure Laterality Date   APPENDECTOMY     BREAST LUMPECTOMY  02/05/2010   Dr. Gershon Mussel Cornett   CATARACT EXTRACTION W/ INTRAOCULAR LENS  IMPLANT,  BILATERAL  04/2013   Dr. Bing Plume   CHOLECYSTECTOMY  2004   Dr. Zella Richer   COLONOSCOPY  2001   Dr. Collene Mares   fibroidectomy  1950   HEMORRHOID SURGERY  1978   KYPHOPLASTY  2010   T10 & T12 Dr. Erline Levine   MEDIAL PARTIAL KNEE REPLACEMENT Left 2004   Dr. Ronnie Derby   SPINE SURGERY     vertebroplasty T10, T12   SPINE SURGERY  11/02/2001    C4-5 & C5-6 Titanium Plate Placed Dr. Carloyn Manner   THYROIDECTOMY, PARTIAL Left 2007   TOTAL KNEE ARTHROPLASTY Right 2009   Dr. Alvan Dame     Allergies  Allergen Reactions   Bactrim     unknown   Betadine [Povidone Iodine]     unknown   Metformin And Related     Hurt stomach   Nutmeg Oil (Myristica Oil)     unknown   Sulfa Antibiotics     unknown    Allergies as of 06/16/2022       Reactions   Bactrim    unknown   Betadine [povidone Iodine]    unknown   Metformin And Related    Hurt stomach   Nutmeg Oil (myristica Oil)    unknown   Sulfa Antibiotics    unknown        Medication List        Accurate as of June 16, 2022 11:59 PM. If you have any questions, ask your nurse or doctor.          STOP taking these medications    oxyCODONE-acetaminophen 5-325 MG tablet Commonly known as: PERCOCET/ROXICET Stopped by: Christinna Sprung X Young Brim, NP       TAKE these medications    acetaminophen 325 MG tablet Commonly known as: TYLENOL Take 650 mg by mouth every 6 (six) hours as needed for mild pain. Three times a day What changed: Another medication with the same name was removed. Continue taking this medication, and follow the directions you see here. Changed by: Daylin Eads X Thania Woodlief, NP   amLODipine 5 MG tablet Commonly known as: NORVASC Take 5 mg by mouth daily.   aspirin EC 81 MG tablet Take 1 tablet (81 mg total) by mouth daily.   atorvastatin 10 MG tablet Commonly known as: LIPITOR Take 10 mg by mouth daily.   betamethasone dipropionate 0.05 % ointment Commonly known as: DIPROLENE Apply topically. Apply BID PRN to active rash on extremities .  1 app immediately after shower. Twice A Day - PRN   diclofenac Sodium 1 % Gel Commonly known as: VOLTAREN Apply 2 g topically 3 (three) times daily as needed (knee pain). Apply to both knees   furosemide 20 MG tablet Commonly known as: LASIX Take 20 mg by mouth. Once A Day on Mon, Wed, Fri   losartan 50 MG tablet Commonly known as: Cozaar Take  1 tablet (50 mg total) by mouth daily.   metFORMIN 500 MG 24 hr tablet Commonly known as: GLUCOPHAGE-XR Take 500 mg by mouth 2 (two) times daily. Take 1/2 tablet   metoprolol tartrate 25 MG tablet Commonly known as: LOPRESSOR Take 12.5 mg by mouth 2 (two) times daily. Hold Metoprolol for SBP < 110 and DSP <50   mirtazapine 7.5 MG tablet Commonly known as: REMERON Take 7.5 mg by mouth at bedtime. 1/2 tablet   multivitamins ther. w/minerals Tabs tablet Take 1 tablet by mouth daily. 400 mcg tablet   nitroGLYCERIN 0.4 MG SL tablet Commonly known as: NITROSTAT Place 0.4 mg under the tongue every 5 (five) minutes as needed for chest pain. PLACE 0.4 MG UNDER TONGUE EVERY 5 MINUTES X3 FOR CHEST PAIN, IF NO RELIEF, CALL MD OR SEND TO ED FOR FURTHER EVALUATION.   potassium chloride 10 MEQ CR capsule Commonly known as: MICRO-K Take 10 mEq by mouth. Once A Day on Mon, Wed, Fri        Review of Systems  Constitutional:  Negative for appetite change, fatigue, fever and unexpected weight change.  HENT:  Positive for hearing loss. Negative for congestion and trouble swallowing.   Eyes:  Negative for visual disturbance.  Respiratory:  Negative for cough and shortness of breath.   Cardiovascular:  Positive for leg swelling. Negative for chest pain and palpitations.  Gastrointestinal: Negative.  Negative for abdominal pain, constipation and diarrhea.       Urge incontinent of bowel.   Genitourinary:  Negative for dysuria and urgency.  Musculoskeletal:  Positive for arthralgias and gait problem.       Left knee pain when walking too much.    Skin:  Negative for color change.  Neurological:  Negative for speech difficulty, weakness and light-headedness.  Psychiatric/Behavioral:  Negative for confusion and sleep disturbance. The patient is not nervous/anxious.     Immunization History  Administered Date(s) Administered   Influenza Whole 03/30/2012, 04/13/2013, 04/04/2018   Influenza, High Dose Seasonal PF 04/02/2019   Influenza-Unspecified 04/17/2014, 03/29/2015, 04/18/2021, 04/21/2022   Moderna SARS-COV2 Booster Vaccination 05/08/2020   Moderna Sars-Covid-2 Vaccination 07/02/2019, 07/30/2019, 11/27/2020   PFIZER Comirnaty(Gray Top)Covid-19 Tri-Sucrose Vaccine 03/19/2021   Pneumococcal Conjugate-13 12/08/2017   Pneumococcal Polysaccharide-23 06/30/2001   Td 06/30/2001, 12/08/2017   Unspecified SARS-COV-2 Vaccination 05/01/2022   Pertinent  Health Maintenance Due  Topic Date Due   OPHTHALMOLOGY EXAM  04/02/2016   HEMOGLOBIN A1C  05/29/2022   FOOT EXAM  10/23/2022   INFLUENZA VACCINE  Completed   DEXA SCAN  Completed      11/12/2020    7:45 AM 11/13/2020    2:00 AM 11/13/2020   10:00 AM 11/05/2021    7:37 PM 06/17/2022    9:10 AM  Fall Risk  Falls in the past year?     0  Was there an injury with Fall?     0  Fall Risk Category Calculator     0  Fall Risk Category     Low  Patient Fall Risk Level Moderate fall risk Moderate fall risk Moderate fall risk High fall risk Low fall risk  Patient at Risk for Falls Due to     No Fall Risks  Fall risk Follow up     Falls evaluation completed   Functional Status Survey:    Vitals:   06/16/22 1110  BP: 136/61  Pulse: 70  Resp: 18  Temp: 97.6 F (36.4 C)  SpO2: 98%  Weight: 105  lb 12.8 oz (48 kg)  Height: '5\' 2"'$  (1.575 m)   Body mass index is 19.35 kg/m. Physical Exam Vitals and nursing note reviewed.  Constitutional:      Appearance: Normal appearance.  HENT:     Head: Normocephalic and atraumatic.     Mouth/Throat:     Mouth: Mucous membranes are moist.   Eyes:     Extraocular Movements: Extraocular movements intact.     Conjunctiva/sclera: Conjunctivae normal.     Pupils: Pupils are equal, round, and reactive to light.  Cardiovascular:     Rate and Rhythm: Normal rate and regular rhythm.     Heart sounds: No murmur heard.    Comments: DP pulses present R+L Pulmonary:     Effort: Pulmonary effort is normal.     Breath sounds: No rales.  Abdominal:     General: Bowel sounds are normal. There is no distension.     Palpations: Abdomen is soft.     Tenderness: There is no abdominal tenderness. There is no right CVA tenderness, left CVA tenderness, guarding or rebound.  Genitourinary:    Comments: External hemorrhoids, no injury or bleeding.  Musculoskeletal:     Cervical back: Normal range of motion and neck supple.     Right lower leg: Edema present.     Left lower leg: Edema present.     Comments: Kyphoscoliosis. Minimal edema BLE  Skin:    General: Skin is warm and dry.     Comments: Yellow, thick, long toenails.   Neurological:     General: No focal deficit present.     Mental Status: She is alert. Mental status is at baseline.     Motor: No weakness.     Coordination: Coordination normal.     Gait: Gait abnormal.     Comments: Oriented to person, place.   Psychiatric:        Mood and Affect: Mood normal.        Behavior: Behavior normal.        Thought Content: Thought content normal.        Judgment: Judgment normal.     Labs reviewed: Recent Labs    11/05/21 2039 11/15/21 0000 01/30/22 0000  NA 136 136* 139  K 4.8 4.4 5.1  CL 104 104 111*  CO2 '24 19 22  '$ GLUCOSE 296*  --   --   BUN 20 25* 27*  CREATININE 1.24* 1.1 1.2*  CALCIUM 10.7* 10.3 10.3   Recent Labs    10/24/21 0000 11/05/21 2039 01/30/22 0000  AST '19 27 17  '$ ALT '13 19 12  '$ ALKPHOS 60 52 58  BILITOT  --  0.8  --   PROT  --  6.0*  --   ALBUMIN 4.0 3.5 3.5   Recent Labs    10/24/21 0000 11/05/21 2039 11/15/21 0000 01/30/22 0000  WBC 9.6  15.7* 11.4 7.0  NEUTROABS 5,002.00 12.2* 7,513.00 3,423.00  HGB 13.0 11.0* 9.9* 10.4*  HCT 40 32.8* 29* 30*  MCV  --  91.6  --   --   PLT 290 233 419*  --    Lab Results  Component Value Date   TSH 0.65 01/30/2022   Lab Results  Component Value Date   HGBA1C 7.2 11/26/2021   Lab Results  Component Value Date   CHOL 105 01/30/2022   HDL 48 01/30/2022   LDLCALC 39 01/30/2022   TRIG 94 01/30/2022   CHOLHDL 2.4 06/27/2018    Significant Diagnostic Results  in last 30 days:  No results found.  Assessment/Plan CKD stage 3 due to type 2 diabetes mellitus ([place])  Bun/creat 27/1.2 01/30/22, takes Metformin, Hgb a1c 7.2 11/26/21, update Hgb A1c, due eye exam  Hypercalcemia Ca 11.7>>9.9(corrected to 10.7, PTH 35, s/p Zometa x1, PTHrP <2.0 11/11/20,  Hyperparathyroidism, workup with Dr. Chalmers Cater, Ca 10.3 01/30/22  Essential hypertension takes Losartan, Metoprolol, Amlodipine.   Diastolic CHF (HCC) chronic edema BLE, 04/16/21 Echo EF 85-88%, diastolic dysfunction. on Furosemide  Cognitive impairment at her baseline.   Weight loss stable, on Mirtazapine 7.'5mg'$  qd 01/15/22, too soon to eval. TSH 0.65 01/30/22  Hyperlipidemia takes Atorvastatin, LDL 39 01/30/22  Anemia Hgb 10.4 01/30/22  Due eye exam, Shingrix, Flu, updated COVID vaccine if HPOA desires, then make plans.    Family/ staff Communication: plan of care reviewed with the patient and charge nurse.   Labs/tests ordered:  Hgb a1c  Time spend 40 minutes.

## 2022-06-16 NOTE — Assessment & Plan Note (Signed)
Bun/creat 27/1.2 01/30/22, takes Metformin, Hgb a1c 7.2 11/26/21, update Hgb A1c, due eye exam

## 2022-06-17 ENCOUNTER — Non-Acute Institutional Stay: Payer: Medicare Other | Admitting: Nurse Practitioner

## 2022-06-17 ENCOUNTER — Encounter: Payer: Self-pay | Admitting: Nurse Practitioner

## 2022-06-17 DIAGNOSIS — Z0000 Encounter for general adult medical examination without abnormal findings: Secondary | ICD-10-CM | POA: Diagnosis not present

## 2022-06-17 NOTE — Progress Notes (Signed)
Subjective:   [name] is a 88 y.o. female who presents for Medicare Annual (Subsequent) preventive examination @ [facility] Cardiac Risk Factors include: advanced age (>76mn, >>81women);diabetes mellitus;dyslipidemia;hypertension     Objective:    Today's Vitals   06/17/22 0912 06/17/22 1500  BP: 132/60   Pulse: 70   Resp: 18   Temp: 97.6 F (36.4 C)   SpO2: 98%   Weight: 105 lb (47.6 kg)   Height: '5\' 2"'$  (1.575 m)   PainSc:  2    Body mass index is 19.2 kg/m.     06/17/2022    9:10 AM 06/16/2022   11:13 AM 01/27/2022    3:33 PM 01/15/2022    2:19 PM 01/02/2022    3:56 PM 11/20/2021    1:51 PM 11/19/2021    4:01 PM  Advanced Directives  Does Patient Have a Medical Advance Directive? Yes Yes Yes Yes Yes Yes Yes  Type of AParamedicof APrairie VillageLiving will;Out of facility DNR (pink MOST or yellow form) HS[place]alandLiving will;Out of facility DNR (pink MOST or yellow form) HEnterpriseOut of facility DNR (pink MOST or yellow form);Living will Out of facility DNR (pink MOST or yellow form);HHomeacre-LyndoraLiving will Out of facility DNR (pink MOST or yellow form);HRieg[place]villeLiving will Out of facility DNR (pink MOST or yellow form);HSweden ValleyLiving will Out of facility DNR (pink MOST or yellow form);HMerinoLiving will  Does patient want to make changes to medical advance directive?  No - Patient declined No - Patient declined No - Patient declined No - Patient declined No - Patient declined No - Patient declined  Copy of [place] Chart? Yes - validated most recent copy scanned in chart (See row information) Yes - validated most recent copy scanned in chart (See row information) Yes - validated most recent copy scanned in chart (See row information) Yes - validated most recent copy scanned in chart (See row information) Yes - validated  most recent copy scanned in chart (See row information) Yes - validated most recent copy scanned in chart (See row information) Yes - validated most recent copy scanned in chart (See row information)  Pre-existing out of facility DNR order (yellow form or pink MOST form) Yellow form placed in chart (order not valid for inpatient use) Yellow form placed in chart (order not valid for inpatient use) Yellow form placed in chart (order not valid for inpatient use) Yellow form placed in chart (order not valid for inpatient use) Yellow form placed in chart (order not valid for inpatient use) Yellow form placed in chart (order not valid for inpatient use) Yellow form placed in chart (order not valid for inpatient use)    Current Medications (verified) Outpatient Encounter Medications as of 06/17/2022  Medication Sig   acetaminophen (TYLENOL) 325 MG tablet Take 650 mg by mouth every 6 (six) hours as needed for mild pain. Three times a day   amLODipine (NORVASC) 5 MG tablet Take 5 mg by mouth daily.    aspirin EC 81 MG EC tablet Take 1 tablet (81 mg total) by mouth daily.   atorvastatin (LIPITOR) 10 MG tablet Take 10 mg by mouth daily.   betamethasone dipropionate (DIPROLENE) 0.05 % ointment Apply topically. Apply BID PRN to active rash on extremities . 1 app immediately after shower. Twice A Day - PRN   diclofenac Sodium (VOLTAREN) 1 % GEL Apply  2 g topically 3 (three) times daily as needed (knee pain). Apply to both knees   furosemide (LASIX) 20 MG tablet Take 20 mg by mouth. Once A Day on Mon, Wed, Fri   losartan (COZAAR) 50 MG tablet Take 1 tablet (50 mg total) by mouth daily.   metFORMIN (GLUCOPHAGE-XR) 500 MG 24 hr tablet Take 500 mg by mouth 2 (two) times daily. Take 1/2 tablet   metoprolol tartrate (LOPRESSOR) 25 MG tablet Take 12.5 mg by mouth 2 (two) times daily. Hold Metoprolol for SBP < 110 and DSP <50   mirtazapine (REMERON) 7.5 MG tablet Take 7.5 mg by mouth at bedtime. 1/2 tablet   Multiple  Vitamins-Minerals (MULTIVITAMINS THER. W/MINERALS) TABS tablet Take 1 tablet by mouth daily. 400 mcg tablet   nitroGLYCERIN (NITROSTAT) 0.4 MG SL tablet Place 0.4 mg under the tongue every 5 (five) minutes as needed for chest pain. PLACE 0.4 MG UNDER TONGUE EVERY 5 MINUTES X3 FOR CHEST PAIN, IF NO RELIEF, CALL MD OR SEND TO ED FOR FURTHER EVALUATION.   potassium chloride (MICRO-K) 10 MEQ CR capsule Take 10 mEq by mouth. Once A Day on Mon, Wed, Fri   No facility-administered encounter medications on file as of 06/17/2022.    Allergies (verified) Bactrim, Betadine [povidone iodine], Metformin and related, Nutmeg oil (myristica oil), and Sulfa antibiotics   History: Past Medical History:  Diagnosis Date   Contact dermatitis and other eczema, due to unspecified cause    Cortical senile cataract    GERD (gastroesophageal reflux disease) 02/17/2013   Hyperparathyroidism, unspecified ([place]) 03/26/2010   Irritable bowel syndrome    Keratoconjunctivitis sicca, not specified as Sjogren's    Loss of weight    Macular degeneration (senile) of retina, unspecified    Memory loss    Other and unspecified hyperlipidemia    Other malaise and fatigue 06/17/2010   Pain in joint, site unspecified    Pathologic fracture of vertebrae    Reflux esophagitis    Scoliosis (and kyphoscoliosis), idiopathic    Senile osteoporosis    Type II or unspecified type diabetes mellitus without mention of complication, uncontrolled    Unspecified essential hypertension    Unspecified hereditary and idiopathic peripheral neuropathy    Unspecified vitamin D deficiency    Past Surgical History:  Procedure Laterality Date   APPENDECTOMY     BREAST LUMPECTOMY  02/05/2010   Dr. Gershon Mussel Cornett   CATARACT EXTRACTION W/ INTRAOCULAR LENS  IMPLANT, BILATERAL  04/2013   Dr. Bing Plume   CHOLECYSTECTOMY  2004   Dr. Zella Richer   COLONOSCOPY  2001   Dr. Collene Mares   fibroidectomy  1950   HEMORRHOID SURGERY  1978   KYPHOPLASTY  2010   T10  & T12 Dr. Erline Levine   MEDIAL PARTIAL KNEE REPLACEMENT Left 2004   Dr. Ronnie Derby   SPINE SURGERY     vertebroplasty T10, T12   SPINE SURGERY  11/02/2001    C4-5 & C5-6 Titanium Plate Placed Dr. Carloyn Manner   THYROIDECTOMY, PARTIAL Left 2007   TOTAL KNEE ARTHROPLASTY Right 2009   Dr. Alvan Dame    Family History  Problem Relation Age of Onset   Diabetes Mother    Heart disease Mother        CHF   Stroke Father    Heart disease Sister        MI   Colon cancer Neg Hx    Stomach cancer Neg Hx    Esophageal cancer Neg Hx    Rectal  cancer Neg Hx    Liver cancer Neg Hx    Social History   Socioeconomic History   Marital status: Widowed    Spouse name: Not on file   Number of children: 0   Years of education: Not on file   Highest education level: Not on file  Occupational History   Occupation: retired Marketing executive: RETIRED  Tobacco Use   Smoking status: Never   Smokeless tobacco: Never  Vaping Use   Vaping Use: Never used  Substance and Sexual Activity   Alcohol use: No   Drug use: No   Sexual activity: Never    Partners: Male    Birth control/protection: Post-menopausal  Other Topics Concern   Not on file  Social History Narrative   Lives at [facility] since 02/06/2009   Widowed    No childred   Living Will   Social Determinants of Health   Financial Resource Strain: [place]  (11/24/2017)   Overall Financial Resource Strain (CARDIA)    Difficulty of Paying Living Expenses: Not hard at all  Food Insecurity: No Food Insecurity (11/24/2017)   Hunger Vital Sign    Worried About Running Out of Food in the Last Year: Never true    [place] in the Last Year: Never true  Transportation Needs: No Transportation Needs (11/24/2017)   PRAPARE - Hydrologist (Medical): No    Lack of Transportation (Non-Medical): No  Physical Activity: Insufficiently Active (11/24/2017)   Exercise Vital Sign    Days of Exercise per  Week: 7 days    Minutes of Exercise per Session: 20 min  Stress: No Stress Concern Present (11/24/2017)   [place]    Feeling of Stress : Only a little  Social Connections: Moderately Isolated (11/24/2017)   Social Connection and Isolation Panel [NHANES]    Frequency of Communication with Friends and Family: More than three times a week    Frequency of Social Gatherings with Friends and Family: More than three times a week    Attends Religious Services: Never    Marine scientist or Organizations: No    Attends Archivist Meetings: Never    Marital Status: Widowed    Tobacco Counseling Counseling given: Not Answered   Clinical Intake:  Pre-visit preparation completed: Yes  Pain : 0-10 Pain Score: 2  Pain Type: Chronic pain Pain Location: Back Pain Orientation: Mid Pain Descriptors / Indicators: Aching, Dull Pain Onset: More than a month ago Pain Frequency: Occasional Pain Relieving Factors: rest Effect of Pain on Daily Activities: none  Pain Relieving Factors: rest  BMI - recorded: 19.2 Nutritional Status: BMI of 19-24  Normal Nutritional Risks: None Diabetes: Yes CBG done?: Yes CBG resulted in Enter/ Edit results?: Yes Did pt. bring in CBG monitor from home?: Yes Glucose Meter Downloaded?: No  How often do you need to have someone help you when you read instructions, pamphlets, or other written materials from your doctor or pharmacy?: 2 - Rarely What is the last grade level you completed in school?: college  Diabetic?yes  Interpreter Needed?: No  Information entered by :: Allena Pietila Bretta Bang NP   Activities of Daily Living    06/17/2022    3:02 PM  In your present state of health, do you have any difficulty performing the following activities:  Hearing? 0  Vision? 0  Difficulty concentrating  or making decisions? 1  Walking or climbing stairs? 0  Dressing or bathing? 0  Doing  errands, shopping? 1  Preparing Food and eating ? N  Using the Toilet? N  In the past six months, have you accidently leaked urine? Y  Do you have problems with loss of bowel control? N  Managing your Medications? Y  Managing your Finances? Y  Housekeeping or managing your Housekeeping? Y    Patient Care Team: Jarica Plass X, NP as PCP - General (Internal Medicine) [place] Mariaceleste Herrera X, NP as Nurse Practitioner (Nurse Practitioner) Jacelyn Pi, MD as Consulting Physician (Endocrinology) Calvert Cantor, MD as Consulting Physician (Ophthalmology) Vickey Huger, MD as Consulting Physician (Orthopedic Surgery) Suella Broad, MD as Consulting Physician (Physical Medicine and Rehabilitation) Erline Levine, MD as Consulting Physician (Neurosurgery) Juanita Craver, MD as Consulting Physician (Gastroenterology) [place], Peter M, MD as Consulting Physician (Cardiology) Megan Salon, MD as Consulting Physician (Gynecology)  Indicate any recent Medical Services you may have received from other than [facility] providers in the past year (date may be approximate).     Assessment:   This is a routine wellness examination for Lisseth.  Hearing/Vision screen No results found.  Dietary issues and exercise activities discussed: Current Exercise Habits: The patient does not participate in regular exercise at present, Exercise limited by: cardiac condition(s)   Goals Addressed             This Visit's Progress    Cognitive Function Enhanced       Evidence-based guidance:  Assess changes in cognitive function using standardized assessment when available.  Encourage and refer for services that stimulate thinking, problem-solving and memory, such as cognitive training and cognitive rehabilitation.  Encourage physical activity or exercise based on ability and tolerance.  Encourage enjoyable activities that provide general stimulation for thinking, concentration and memory in a social  setting or small group.   Notes:        Depression Screen    11/24/2017   10:57 AM 07/09/2017    2:23 PM 11/01/2015    3:49 PM 06/21/2015    2:04 PM 12/21/2014    2:07 PM 01/03/2014    2:05 PM 12/08/2013    3:43 PM  PHQ 2/9 Scores  PHQ - 2 Score 0 0 0 0 0 0 0    Fall Risk    06/17/2022    9:10 AM 11/24/2017   10:57 AM 07/09/2017    2:23 PM 11/01/2015    3:49 PM 07/26/2015    3:14 PM  Fall Risk   Falls in the past year? 0 No No No No  Number falls in past yr: 0      Injury with Fall? 0      Risk for fall due to : No Fall Risks      Follow up Falls evaluation completed        [place]:  Any stairs in or around the home? Yes  If so, are there any without handrails? No  Home free of loose throw rugs in walkways, pet beds, electrical cords, etc? Yes  Adequate lighting in your home to reduce risk of falls? Yes   ASSISTIVE DEVICES UTILIZED TO PREVENT FALLS:  Life alert? No  Use of a cane, walker or w/c? yes Grab bars in the bathroom? Yes  Shower chair or bench in shower? Yes  Elevated toilet seat or a handicapped toilet? Yes   TIMED UP AND GO:  Was the test performed? No . Ambulates with walker    Gait steady and fast with assistive device  Cognitive Function:    11/09/2017    8:44 AM 05/01/2016    2:37 PM 06/08/2014    3:45 PM 02/17/2013    2:52 PM  MMSE - Mini Mental State Exam  Orientation to time '3 5 5 4  '$ Orientation to Place '4 5 5 5  '$ Registration '2 3 3 3  '$ Attention/ Calculation '5 5 5 5  '$ Recall '2 2 3 3  '$ Language- name 2 objects '2 2 2 2  '$ Language- repeat '1 1 1 1  '$ Language- follow 3 step command '3 3 3 3  '$ Language- read & follow direction '1 1 1 1  '$ Write a sentence '1 1 1 1  '$ Copy design 0 '1 1 1  '$ Total score '24 29 30 29        '$ Immunizations Immunization History  Administered Date(s) Administered   Influenza Whole 03/30/2012, 04/13/2013, 04/04/2018   Influenza, High Dose Seasonal PF 04/02/2019   Influenza-Unspecified  04/17/2014, 03/29/2015, 04/18/2021, 04/21/2022   Moderna SARS-COV2 Booster Vaccination 05/08/2020   Moderna Sars-Covid-2 Vaccination 07/02/2019, 07/30/2019, 11/27/2020   PFIZER Comirnaty(Gray Top)Covid-19 Tri-Sucrose Vaccine 03/19/2021   Pneumococcal Conjugate-13 12/08/2017   Pneumococcal Polysaccharide-23 06/30/2001   Td 06/30/2001, 12/08/2017   Unspecified SARS-COV-2 Vaccination 05/01/2022    TDAP status: Up to date  Flu Vaccine status: Up to date  Pneumococcal vaccine status: Up to date  Covid-19 vaccine status: Completed vaccines  Qualifies for Shingles Vaccine? Yes   Zostavax completed Yes   Shingrix Completed?: No.    Education has been provided regarding the importance of this vaccine. Patient has been advised to call insurance company to determine out of pocket expense if they have not yet received this vaccine. Advised may also receive vaccine at local pharmacy or Health Dept. Verbalized acceptance and understanding.  Screening Tests Health Maintenance  Topic Date Due   Zoster Vaccines- Shingrix (1 of 2) Never done   OPHTHALMOLOGY EXAM  04/02/2016   HEMOGLOBIN A1C  05/29/2022   COVID-19 Vaccine (7 - 2023-24 season) 06/26/2022   FOOT EXAM  10/23/2022   Medicare Annual Wellness (AWV)  06/18/2023   DTaP/Tdap/Td (3 - Tdap) 12/09/2027   Pneumonia Vaccine 51+ Years old  Completed   INFLUENZA VACCINE  Completed   DEXA SCAN  Completed   HPV VACCINES  Aged Out    Health Maintenance  Health Maintenance Due  Topic Date Due   Zoster Vaccines- Shingrix (1 of 2) Never done   OPHTHALMOLOGY EXAM  04/02/2016   HEMOGLOBIN A1C  05/29/2022    Colorectal cancer screening: No longer required.   Mammogram status: No longer required due to aged out.  Bone Density status: Ordered 06/17/22. Pt provided with contact info and advised to call to schedule appt.  Lung Cancer Screening: (Low Dose CT Chest recommended if Age 51-80 years, 30 pack-year currently smoking OR have quit w/in  15years.) does not qualify.   Lung Cancer Screening Referral:   Additional Screening:  Hepatitis C Screening: does not qualify; Completed   Vision Screening: Recommended annual ophthalmology exams for early detection of glaucoma and other disorders of the eye. Is the patient up to date with their annual eye exam?  Yes  Who is the provider or what is the name of the office in which the patient attends annual eye exams? [facility] will provide If pt is not established with a provider, would they like to be referred  to a provider to establish care? No .   Dental Screening: Recommended annual dental exams for proper oral hygiene  Community Resource Referral / Chronic Care Management: CRR required this visit?  No   CCM required this visit?  No      Plan:   Obtain Hgb A1c, MMSE DEXA, Shingrix, Eye exam if [facility] desires.  I have personally reviewed and noted the following in the patient's chart:   Medical and social history Use of alcohol, tobacco or illicit drugs  Current medications and supplements including opioid prescriptions. Patient is not currently taking opioid prescriptions. Functional ability and status Nutritional status Physical activity Advanced directives List of other physicians Hospitalizations, surgeries, and ER visits in previous 12 months Vitals Screenings to include cognitive, depression, and falls Referrals and appointments  In addition, I have reviewed and discussed with patient certain preventive protocols, quality metrics, and best practice recommendations. A written personalized care plan for preventive services as well as general preventive health recommendations were provided to patient.     Rosan Calbert X Deven Audi, NP   06/17/2022

## 2022-10-14 ENCOUNTER — Non-Acute Institutional Stay: Payer: Medicare Other | Admitting: Family Medicine

## 2022-10-14 DIAGNOSIS — E0821 Diabetes mellitus due to underlying condition with diabetic nephropathy: Secondary | ICD-10-CM | POA: Insufficient documentation

## 2022-10-14 DIAGNOSIS — R4189 Other symptoms and signs involving cognitive functions and awareness: Secondary | ICD-10-CM

## 2022-10-14 DIAGNOSIS — R634 Abnormal weight loss: Secondary | ICD-10-CM

## 2022-10-14 DIAGNOSIS — N183 Chronic kidney disease, stage 3 unspecified: Secondary | ICD-10-CM

## 2022-10-14 DIAGNOSIS — I10 Essential (primary) hypertension: Secondary | ICD-10-CM

## 2022-10-14 DIAGNOSIS — E1122 Type 2 diabetes mellitus with diabetic chronic kidney disease: Secondary | ICD-10-CM | POA: Diagnosis not present

## 2022-10-14 DIAGNOSIS — R2681 Unsteadiness on feet: Secondary | ICD-10-CM

## 2022-10-14 NOTE — Progress Notes (Signed)
Provider:  Jacalyn Lefevre, MD Location:      Place of Service:     PCP: Mast, Man X, NP Patient Care Team: Mast, Man X, NP as PCP - General (Internal Medicine) [place] Mast, Man X, NP as Nurse Practitioner (Nurse Practitioner) Dorisann Frames, MD as Consulting Physician (Endocrinology) Nelson Chimes, MD as Consulting Physician (Ophthalmology) Dannielle Huh, MD as Consulting Physician (Orthopedic Surgery) Sheran Luz, MD as Consulting Physician (Physical Medicine and Rehabilitation) Maeola Harman, MD as Consulting Physician (Neurosurgery) Charna Elizabeth, MD as Consulting Physician (Gastroenterology) [place], Peter M, MD as Consulting Physician (Cardiology) Jerene Bears, MD as Consulting Physician (Gynecology)  Extended Emergency Contact Information Primary Emergency Contact: Iris Pert of [place] Phone: [phone] Relation: Niece Secondary Emergency Contact: Kizzie Bane Mobile Phone: [phone] Relation: Other Preferred language: [place] Interpreter needed? No Guardian: Rosanne Sack Mobile Phone: [phone] Relation: Legal Guardian Preferred language: English Interpreter needed? No  Code Status:  Goals of Care: Advanced Directive information    06/17/2022    9:10 AM  Advanced Directives  Does Patient Have a Medical Advance Directive? Yes  Type of Estate agent of [place];Living will;Out of facility DNR (pink MOST or yellow form)  Copy of Healthcare Power of Attorney in Chart? Yes - validated most recent copy scanned in chart (See row information)  Pre-existing out of facility DNR order (yellow form or pink MOST form) Yellow form placed in chart (order not valid for inpatient use)      No chief complaint on file.   HPI: Patient is a 89 y.o. female seen today for medical management of chronic problems including hyperparathyroidism, weight loss, senile dementia, type 2 diabetes with nephropathy,  hypertension, and congestive heart failure.  I met her in her room today as she was munching on a candy bar.  She acted as if she did not want me to see her eating the candy bar in view of her diabetes. She voices no complaints.  Cognitively she does well.  She did repeat herself several times but I would estimate this within normal limits for someone 88 years old. She continues to take metformin for diabetes most recent hemoglobin A1c was 7.211 months ago.  Blood pressure is managed with combination losartan metoprolol and amlodipine. She denies any dependent edema or shortness of breath.   Past Medical History:  Diagnosis Date   Contact dermatitis and other eczema, due to unspecified cause    Cortical senile cataract    GERD (gastroesophageal reflux disease) 02/17/2013   Hyperparathyroidism, unspecified (HCC) 03/26/2010   Irritable bowel syndrome    Keratoconjunctivitis sicca, not specified as Sjogren's    Loss of weight    Macular degeneration (senile) of retina, unspecified    Memory loss    Other and unspecified hyperlipidemia    Other malaise and fatigue 06/17/2010   Pain in joint, site unspecified    Pathologic fracture of vertebrae    Reflux esophagitis    Scoliosis (and kyphoscoliosis), idiopathic    Senile osteoporosis    Type II or unspecified type diabetes mellitus without mention of complication, uncontrolled    Unspecified essential hypertension    Unspecified hereditary and idiopathic peripheral neuropathy    Unspecified vitamin D deficiency    Past Surgical History:  Procedure Laterality Date   APPENDECTOMY     BREAST LUMPECTOMY  02/05/2010   Dr. Elijah Birk Cornett   CATARACT EXTRACTION W/ INTRAOCULAR LENS  IMPLANT, BILATERAL  04/2013   Dr. Hazle Quant  CHOLECYSTECTOMY  2004   Dr. Abbey Chatters   COLONOSCOPY  2001   Dr. Loreta Ave   fibroidectomy  1950   HEMORRHOID SURGERY  1978   KYPHOPLASTY  2010   T10 & T12 Dr. Maeola Harman   MEDIAL PARTIAL KNEE REPLACEMENT Left 2004   Dr. Sherlean Foot    SPINE SURGERY     vertebroplasty T10, T12   SPINE SURGERY  11/02/2001    C4-5 & C5-6 Titanium Plate Placed Dr. Channing Mutters   THYROIDECTOMY, PARTIAL Left 2007   TOTAL KNEE ARTHROPLASTY Right 2009   Dr. Charlann Boxer     reports that she has never smoked. She has never used smokeless tobacco. She reports that she does not drink alcohol and does not use drugs. Social History   Socioeconomic History   Marital status: Widowed    Spouse name: Not on file   Number of children: 0   Years of education: Not on file   Highest education level: Not on file  Occupational History   Occupation: retired Insurance underwriter: RETIRED  Tobacco Use   Smoking status: Never   Smokeless tobacco: Never  Vaping Use   Vaping Use: Never used  Substance and Sexual Activity   Alcohol use: No   Drug use: No   Sexual activity: Never    Partners: Male    Birth control/protection: Post-menopausal  Other Topics Concern   Not on file  Social History Narrative   Lives at [facility] [place] since 02/06/2009   Widowed    No childred   Living Will   Social Determinants of Health   Financial Resource Strain: Low Risk  (11/24/2017)   Overall Financial Resource Strain (CARDIA)    Difficulty of Paying Living Expenses: Not hard at all  Food Insecurity: No Food Insecurity (11/24/2017)   Hunger Vital Sign    Worried About Running Out of Food in the Last Year: Never true    Ran Out of Food in the Last Year: Never true  Transportation Needs: No Transportation Needs (11/24/2017)   PRAPARE - Administrator, Civil Service (Medical): No    Lack of Transportation (Non-Medical): No  Physical Activity: Insufficiently Active (11/24/2017)   Exercise Vital Sign    Days of Exercise per Week: 7 days    Minutes of Exercise per Session: 20 min  Stress: No Stress Concern Present (11/24/2017)   [employer] of Occupational Health - Occupational Stress Questionnaire    Feeling of Stress : Only a little   Social Connections: Moderately Isolated (11/24/2017)   Social Connection and Isolation Panel [NHANES]    Frequency of Communication with Friends and Family: More than three times a week    Frequency of Social Gatherings with Friends and Family: More than three times a week    Attends Religious Services: Never    Database administrator or Organizations: No    Attends Banker Meetings: Never    Marital Status: Widowed  Intimate Partner Violence: Not At Risk (11/24/2017)   Humiliation, Afraid, Rape, and Kick questionnaire    Fear of Current or Ex-Partner: No    Emotionally Abused: No    Physically Abused: No    Sexually Abused: No    Functional Status Survey:    Family History  Problem Relation Age of Onset   Diabetes Mother    Heart disease Mother        CHF   Stroke Father    Heart disease Sister  MI   Colon cancer Neg Hx    Stomach cancer Neg Hx    Esophageal cancer Neg Hx    Rectal cancer Neg Hx    Liver cancer Neg Hx     Health Maintenance  Topic Date Due   HEMOGLOBIN A1C  05/29/2022   COVID-19 Vaccine (7 - 2023-24 season) 06/26/2022   OPHTHALMOLOGY EXAM  08/29/2022   FOOT EXAM  10/23/2022   INFLUENZA VACCINE  01/29/2023   Medicare Annual Wellness (AWV)  06/18/2023   DTaP/Tdap/Td (3 - Tdap) 12/09/2027   Pneumonia Vaccine 90+ Years old  Completed   DEXA SCAN  Completed   HPV VACCINES  Aged Out   Zoster Vaccines- Shingrix  Discontinued    Allergies  Allergen Reactions   Bactrim     unknown   Betadine [Povidone Iodine]     unknown   Metformin And Related     Hurt stomach   Nutmeg Oil (Myristica Oil)     unknown   Sulfa Antibiotics     unknown    Outpatient Encounter Medications as of 10/14/2022  Medication Sig   acetaminophen (TYLENOL) 325 MG tablet Take 650 mg by mouth every 6 (six) hours as needed for mild pain. Three times a day   amLODipine (NORVASC) 5 MG tablet Take 5 mg by mouth daily.    aspirin EC 81 MG EC tablet Take 1  tablet (81 mg total) by mouth daily.   atorvastatin (LIPITOR) 10 MG tablet Take 10 mg by mouth daily.   betamethasone dipropionate (DIPROLENE) 0.05 % ointment Apply topically. Apply BID PRN to active rash on extremities . 1 app immediately after shower. Twice A Day - PRN   diclofenac Sodium (VOLTAREN) 1 % GEL Apply 2 g topically 3 (three) times daily as needed (knee pain). Apply to both knees   furosemide (LASIX) 20 MG tablet Take 20 mg by mouth. Once A Day on Mon, Wed, Fri   losartan (COZAAR) 50 MG tablet Take 1 tablet (50 mg total) by mouth daily.   metFORMIN (GLUCOPHAGE-XR) 500 MG 24 hr tablet Take 500 mg by mouth 2 (two) times daily. Take 1/2 tablet   metoprolol tartrate (LOPRESSOR) 25 MG tablet Take 12.5 mg by mouth 2 (two) times daily. Hold Metoprolol for SBP < 110 and DSP <50   mirtazapine (REMERON) 7.5 MG tablet Take 7.5 mg by mouth at bedtime. 1/2 tablet   Multiple Vitamins-Minerals (MULTIVITAMINS THER. W/MINERALS) TABS tablet Take 1 tablet by mouth daily. 400 mcg tablet   nitroGLYCERIN (NITROSTAT) 0.4 MG SL tablet Place 0.4 mg under the tongue every 5 (five) minutes as needed for chest pain. PLACE 0.4 MG UNDER TONGUE EVERY 5 MINUTES X3 FOR CHEST PAIN, IF NO RELIEF, CALL MD OR SEND TO ED FOR FURTHER EVALUATION.   potassium chloride (MICRO-K) 10 MEQ CR capsule Take 10 mEq by mouth. Once A Day on Mon, Wed, Fri   No facility-administered encounter medications on file as of 10/14/2022.    Review of Systems  Constitutional: Negative.   HENT: Negative.    Respiratory: Negative.    Cardiovascular: Negative.   Gastrointestinal: Negative.   Genitourinary: Negative.   Musculoskeletal:  Positive for gait problem.  Psychiatric/Behavioral: Negative.    All other systems reviewed and are negative.   There were no vitals filed for this visit. There is no height or weight on file to calculate BMI. Physical Exam Vitals and nursing note reviewed.  Constitutional:      Appearance: Normal  appearance.  HENT:     Mouth/Throat:     Mouth: Mucous membranes are moist.     Pharynx: Oropharynx is clear.  Cardiovascular:     Rate and Rhythm: Normal rate and regular rhythm.  Pulmonary:     Effort: Pulmonary effort is normal.     Breath sounds: Rhonchi present.  Abdominal:     General: Bowel sounds are normal.     Palpations: Abdomen is soft.  Neurological:     General: No focal deficit present.     Mental Status: She is alert.  Psychiatric:        Mood and Affect: Mood normal.        Behavior: Behavior normal.     Labs reviewed: Basic Metabolic Panel: Recent Labs    11/05/21 2039 11/15/21 0000 01/30/22 0000  NA 136 136* 139  K 4.8 4.4 5.1  CL 104 104 111*  CO2 GLUCOSE 296*  --   --   BUN 20 25* 27*  CREATININE 1.24* 1.1 1.2*  CALCIUM 10.7* 10.3 10.3   Liver Function Tests: Recent Labs    10/24/21 0000 11/05/21 2039 01/30/22 0000  AST ALT ALKPHOS 60 52 58  BILITOT  --  0.8  --   PROT  --  6.0*  --   ALBUMIN 4.0 3.5 3.5   No results for input(s): "LIPASE", "AMYLASE" in the last 8760 hours. No results for input(s): "AMMONIA" in the last 8760 hours. CBC: Recent Labs    10/24/21 0000 11/05/21 2039 11/15/21 0000 01/30/22 0000  WBC 9.6 15.7* 11.4 7.0  NEUTROABS 5,002.00 12.2* 7,513.00 3,423.00  HGB 13.0 11.0* 9.9* 10.4*  HCT 40 32.8* 29* 30*  MCV  --  91.6  --   --   PLT 290 233 419*  --    Cardiac Enzymes: No results for input(s): "CKTOTAL", "CKMB", "CKMBINDEX", "TROPONINI" in the last 8760 hours. BNP: Invalid input(s): "POCBNP" Lab Results  Component Value Date   HGBA1C 7.2 11/26/2021   Lab Results  Component Value Date   TSH 0.65 01/30/2022   Lab Results  Component Value Date   VITAMINB12 784 11/13/2020   Lab Results  Component Value Date   FOLATE 44.1 11/13/2020   Lab Results  Component Value Date   IRON 70 11/13/2020   TIBC 307 11/13/2020   FERRITIN 22 11/13/2020    Imaging and Procedures  obtained prior to SNF admission: DG Chest 1 View  Result Date: 11/05/2021 CLINICAL DATA:  Fall, left arm pain EXAM: CHEST  1 VIEW COMPARISON:  None Available. FINDINGS: Lungs are clear. No pneumothorax or pleural effusion. Cardiac size within normal limits. Pulmonary vascularity is normal. Acute three-part fracture of the left humeral head is seen with fractures involving the greater tuberosity and surgical neck of the humerus. T10 and T12 vertebroplasty has been performed. IMPRESSION: Acute three-part fracture of the left humeral head. No radiographic evidence of acute cardiopulmonary disease. Electronically Signed   By: Helyn Numbers M.D.   On: 11/05/2021 20:45   DG Forearm Left  Result Date: 11/05/2021 CLINICAL DATA:  Fall, left arm pain EXAM: LEFT FOREARM - 2 VIEW COMPARISON:  None Available. FINDINGS: Normal alignment. No acute fracture or dislocation. Chondrocalcinosis within the radiocarpal articulation is likely degenerative in nature. Soft tissues are unremarkable. IMPRESSION: No acute fracture or dislocation. Electronically Signed   By: Helyn Numbers M.D.   On: 11/05/2021 20:43   DG Pelvis 1-2 Views  Result Date: 11/05/2021 CLINICAL DATA:  Fall, pelvic injury EXAM: PELVIS - 1-2 VIEW COMPARISON:  None Available. FINDINGS: Normal alignment. No acute fracture or dislocation. Moderate bilateral degenerative hip arthritis with joint space narrowing noted. Densely calcified fibroid noted within the pelvis. Soft tissues are otherwise unremarkable. IMPRESSION: No acute fracture or dislocation. Electronically Signed   By: Helyn Numbers M.D.   On: 11/05/2021 20:42   DG Humerus Left  Result Date: 11/05/2021 CLINICAL DATA:  Fall, left arm pain EXAM: LEFT HUMERUS - 2+ VIEW COMPARISON:  None Available. FINDINGS: There is an acute fracture involving the greater tuberosity of the left humeral head which appears posterolaterally displaced. There is a subtle lucency extending across the surgical neck of the  humerus and a nondisplaced fracture of the surgical neck is not excluded. Humeral head appears seated within the glenoid fossa. The distal humerus is intact. IMPRESSION: Fractures of the greater tuberosity of the left humeral head and possibly the surgical neck of the left humerus. This could be better assessed with dedicated CT imaging. Electronically Signed   By: Helyn Numbers M.D.   On: 11/05/2021 20:41   CT Cervical Spine Wo Contrast  Result Date: 11/05/2021 CLINICAL DATA:  Neck trauma (Age >= 65y).  Fall. EXAM: CT CERVICAL SPINE WITHOUT CONTRAST TECHNIQUE: Multidetector CT imaging of the cervical spine was performed without intravenous contrast. Multiplanar CT image reconstructions were also generated. RADIATION DOSE REDUCTION: This exam was performed according to the departmental dose-optimization program which includes automated exposure control, adjustment of the mA and/or kV according to patient size and/or use of iterative reconstruction technique. COMPARISON:  None Available. FINDINGS: Alignment: Grade 1 degenerative anterolisthesis of C6 on C7 and C7 on T1. Skull base and vertebrae: No acute fracture. No primary bone lesion or focal pathologic process. Soft tissues and spinal canal: Choose 1 Disc levels: Prior anterior fusion from C4-C6. Advanced degenerative disc disease at C6-7 and C2-3. Bilateral moderate degenerative facet disease. Upper chest: Biapical scarring. Other: None IMPRESSION: Degenerative disc and facet disease. Prior ACDF C4-C6. No acute bony abnormality. Electronically Signed   By: Charlett Nose M.D.   On: 11/05/2021 20:27   CT Maxillofacial Wo Contrast  Result Date: 11/05/2021 CLINICAL DATA:  Facial trauma, blunt.  Fall. EXAM: CT MAXILLOFACIAL WITHOUT CONTRAST TECHNIQUE: Multidetector CT imaging of the maxillofacial structures was performed. Multiplanar CT image reconstructions were also generated. RADIATION DOSE REDUCTION: This exam was performed according to the departmental  dose-optimization program which includes automated exposure control, adjustment of the mA and/or kV according to patient size and/or use of iterative reconstruction technique. COMPARISON:  None Available. FINDINGS: Osseous: Depressed fracture through the anterior wall of the left maxillary sinus. Fracture also noted through the posterior wall of the left maxillary sinus. Orbits: Fracture through the floor of the left orbit and lateral wall of the left orbit. Globes are intact. No orbital emphysema. Sinuses: Blood seen within the left maxillary sinus. Soft tissues: Soft tissue swelling over the left face and orbit. Limited intracranial: See head CT report IMPRESSION: Fractures through the left lateral orbital wall and floor of the left orbit. Fractures through the anterior and posterior walls of the left maxillary sinus. Left fills the left maxillary sinus. Electronically Signed   By: Charlett Nose M.D.   On: 11/05/2021 20:25   CT HEAD WO CONTRAST ( )  Result Date: 11/05/2021 CLINICAL DATA:  Head trauma, intracranial venous injury suspected, fall EXAM: CT HEAD WITHOUT CONTRAST TECHNIQUE: Contiguous axial images were obtained from the base  of the skull through the vertex without intravenous contrast. RADIATION DOSE REDUCTION: This exam was performed according to the departmental dose-optimization program which includes automated exposure control, adjustment of the mA and/or kV according to patient size and/or use of iterative reconstruction technique. COMPARISON:  None Available. FINDINGS: Brain: There is atrophy and chronic small vessel disease changes. No acute intracranial abnormality. Specifically, no hemorrhage, hydrocephalus, mass lesion, acute infarction, or significant intracranial injury. Vascular: No hyperdense vessel or unexpected calcification. Skull: No acute calvarial abnormality. Sinuses/Orbits: Fractures through the anterior wall of the left maxillary sinus and floor of the left orbit. Left lateral  orbital wall fracture. Fracture through the posterior wall of the left maxillary sinus. Blood seen within the left maxillary sinus. Other: None IMPRESSION: No acute intracranial abnormality. Atrophy, chronic small vessel disease. Fractures through the anterior and posterior wall of the maxillary sinus. Fractures through the lateral wall and floor the left orbit. Electronically Signed   By: Charlett Nose M.D.   On: 11/05/2021 20:23    Assessment/Plan 1. CKD stage 3 due to type 2 diabetes mellitus Renal function is stable at last check with creatinine around 1.2.  Will need to follow creatinine for continued use of metformin  2. Cognitive impairment This is mild.  We had a normal conversation.  She did repeat herself several times and made a few mistakes but generally cognitive function is good  3. Essential hypertension Blood pressure is managed on combination of losartan and amlodipine and metoprolol  4. Hypercalcemia Calcium's have been in normal range, corrected, for about the past year  5. Unsteady gait Patient dependent on her walker but she goes most to where she wants to with a walker  6. Weight loss Weight has been stable over the past 6 months on mirtazapine    Family/ staff Communication:   Labs/tests ordered:  Bertram Millard. Hyacinth Meeker, MD [facility] [address] [place], [place] [address] Office [phone]

## 2022-12-24 LAB — LIPID PANEL
Cholesterol: 133 (ref 0–200)
HDL: 59 (ref 35–70)
LDL Cholesterol: 58
LDl/HDL Ratio: 2.3
Triglycerides: 80 (ref 40–160)

## 2022-12-24 LAB — COMPREHENSIVE METABOLIC PANEL
Albumin: 4.1 (ref 3.5–5.0)
Calcium: 10.9 — AB (ref 8.7–10.7)
Globulin: 2.2

## 2022-12-24 LAB — HEMOGLOBIN A1C: Hemoglobin A1C: 6.9

## 2022-12-24 LAB — BASIC METABOLIC PANEL
BUN: 25 — AB (ref 4–21)
CO2: 26 — AB (ref 13–22)
Chloride: 104 (ref 99–108)
Creatinine: 1.2 — AB (ref 0.5–1.1)
Glucose: 113
Potassium: 4.5 mEq/L (ref 3.5–5.1)
Sodium: 139 (ref 137–147)

## 2022-12-24 LAB — VITAMIN B12: Vitamin B-12: 601

## 2022-12-24 LAB — HEPATIC FUNCTION PANEL
ALT: 15 U/L (ref 7–35)
AST: 21 (ref 13–35)
Alkaline Phosphatase: 57 (ref 25–125)
Bilirubin, Total: 0.6

## 2023-01-06 ENCOUNTER — Encounter: Payer: Self-pay | Admitting: Nurse Practitioner

## 2023-01-06 ENCOUNTER — Non-Acute Institutional Stay: Payer: Self-pay | Admitting: Nurse Practitioner

## 2023-01-06 DIAGNOSIS — I10 Essential (primary) hypertension: Secondary | ICD-10-CM | POA: Diagnosis not present

## 2023-01-06 DIAGNOSIS — I5032 Chronic diastolic (congestive) heart failure: Secondary | ICD-10-CM

## 2023-01-06 DIAGNOSIS — E785 Hyperlipidemia, unspecified: Secondary | ICD-10-CM

## 2023-01-06 DIAGNOSIS — R4189 Other symptoms and signs involving cognitive functions and awareness: Secondary | ICD-10-CM

## 2023-01-06 DIAGNOSIS — D649 Anemia, unspecified: Secondary | ICD-10-CM

## 2023-01-06 DIAGNOSIS — E0821 Diabetes mellitus due to underlying condition with diabetic nephropathy: Secondary | ICD-10-CM

## 2023-01-06 NOTE — Assessment & Plan Note (Signed)
chronic edema BLE, 04/16/21 Echo EF 40-45%, diastolic dysfunction. on Furosemide 

## 2023-01-06 NOTE — Assessment & Plan Note (Signed)
at her baseline. °

## 2023-01-06 NOTE — Assessment & Plan Note (Signed)
Ca 11.7>>9.9(corrected to 10.7, PTH 35, s/p Zometa x1, PTHrP <2.0 11/11/20,  Hyperparathyroidism, workup with Dr. Talmage Nap, Ca 10.3 01/30/22<<10.9 12/24/22, off MVI with minerals 01/06/23

## 2023-01-06 NOTE — Progress Notes (Signed)
Location:  [facility] Room Number: [id number] Place of Service:  ALF (13) Provider:  Tirso Laws X, NP  Patient Care Team: Pearlee Arvizu X, NP as PCP - General (Internal Medicine) [place] Aldous Housel X, NP as Nurse Practitioner (Nurse Practitioner) Dorisann Frames, MD as Consulting Physician (Endocrinology) Nelson Chimes, MD as Consulting Physician (Ophthalmology) Dannielle Huh, MD as Consulting Physician (Orthopedic Surgery) Sheran Luz, MD as Consulting Physician (Physical Medicine and Rehabilitation) Maeola Harman, MD as Consulting Physician (Neurosurgery) Charna Elizabeth, MD as Consulting Physician (Gastroenterology) [place], Peter M, MD as Consulting Physician (Cardiology) Jerene Bears, MD as Consulting Physician (Gynecology)  Extended Emergency Contact Information Primary Emergency Contact: Iris Pert of [place] Phone: [phone] Relation: Niece Secondary Emergency Contact: Kizzie Bane Mobile Phone: [phone] Relation: Other Preferred language: [place] Interpreter needed? No Guardian: Rosanne Sack Mobile Phone: [phone] Relation: Legal Guardian Preferred language: English Interpreter needed? No  Code Status:  DNR Goals of care: Advanced Directive information    01/06/2023   11:26 AM  Advanced Directives  Does Patient Have a Medical Advance Directive? Yes  Type of Estate agent of [place];Out of facility DNR (pink MOST or yellow form);Living will  Does patient want to make changes to medical advance directive? No - Patient declined  Copy of Healthcare Power of Attorney in Chart? Yes - validated most recent copy scanned in chart (See row information)  Pre-existing out of facility DNR order (yellow form or pink MOST form) Yellow form placed in chart (order not valid for inpatient use)     Chief Complaint  Patient presents with   Medical Management of Chronic Issues    Routine  visit   Quality Metric Gaps    Needs to discuss eye and foot exam, Hemoglobin A1C, and Covid vaccine.    HPI:  Pt is a 89 y.o. female seen today for medical management of chronic diseases.    Hypercalcemia, Ca 11.7>>9.9(corrected to 10.7, PTH 35, s/p Zometa x1, PTHrP <2.0 11/11/20,  Hyperparathyroidism, workup with Dr. Talmage Nap, Ca 10.3 01/30/22<<10.9 12/24/22, off MVI with minerals 01/06/23             T2DM, takes Metformin, Hgb a1c 7.2 11/26/21>>6.9 12/24/22             HTN, takes Losartan, Metoprolol, Amlodipine.              CKD stage 3 Bun/creat 25/1.2 12/24/22             CHF, chronic edema BLE, 04/16/21 Echo EF 40-45%, diastolic dysfunction. on Furosemide             Cognitive impairment, at her baseline.              Weight loss, stable, on Mirtazapine 7.5mg  qd 01/15/22, too soon to eval. TSH 0.65 01/30/22             Hyperlipidemia, takes Atorvastatin, LDL 58 12/24/22             Anemia, Hgb 10.4 01/30/22    Past Medical History:  Diagnosis Date   Contact dermatitis and other eczema, due to unspecified cause    Cortical senile cataract    GERD (gastroesophageal reflux disease) 02/17/2013   Hyperparathyroidism, unspecified (HCC) 03/26/2010   Irritable bowel syndrome    Keratoconjunctivitis sicca, not specified as Sjogren's    Loss of weight    Macular degeneration (senile) of retina, unspecified    Memory loss    Other  and unspecified hyperlipidemia    Other malaise and fatigue 06/17/2010   Pain in joint, site unspecified    Pathologic fracture of vertebrae    Reflux esophagitis    Scoliosis (and kyphoscoliosis), idiopathic    Senile osteoporosis    Type II or unspecified type diabetes mellitus without mention of complication, uncontrolled    Unspecified essential hypertension    Unspecified hereditary and idiopathic peripheral neuropathy    Unspecified vitamin D deficiency    Past Surgical History:  Procedure Laterality Date   APPENDECTOMY     BREAST LUMPECTOMY  02/05/2010   Dr. Elijah Birk  [place]   CATARACT EXTRACTION W/ INTRAOCULAR LENS  IMPLANT, BILATERAL  04/2013   Dr. Hazle Quant   CHOLECYSTECTOMY  2004   Dr. Abbey Chatters   COLONOSCOPY  2001   Dr. Loreta Ave   fibroidectomy  1950   HEMORRHOID SURGERY  1978   KYPHOPLASTY  2010   T10 & T12 Dr. Maeola Harman   MEDIAL PARTIAL KNEE REPLACEMENT Left 2004   Dr. Sherlean Foot   SPINE SURGERY     vertebroplasty T10, T12   SPINE SURGERY  11/02/2001    C4-5 & C5-6 Titanium Plate Placed Dr. Channing Mutters   THYROIDECTOMY, PARTIAL Left 2007   TOTAL KNEE ARTHROPLASTY Right 2009   Dr. Charlann Boxer     Allergies  Allergen Reactions   Bactrim     unknown   Betadine [Povidone Iodine]     unknown   Metformin And Related     Hurt stomach   Nutmeg Oil (Myristica Oil)     unknown   Sulfa Antibiotics     unknown    Outpatient Encounter Medications as of 01/06/2023  Medication Sig   acetaminophen (TYLENOL) 325 MG tablet Take 650 mg by mouth every 6 (six) hours as needed for mild pain. Three times a day   amLODipine (NORVASC) 5 MG tablet Take 5 mg by mouth daily.    aspirin EC 81 MG EC tablet Take 1 tablet (81 mg total) by mouth daily.   atorvastatin (LIPITOR) 10 MG tablet Take 10 mg by mouth daily.   betamethasone dipropionate (DIPROLENE) 0.05 % ointment Apply topically. Apply BID PRN to active rash on extremities . 1 app immediately after shower. Twice A Day - PRN   diclofenac Sodium (VOLTAREN) 1 % GEL Apply 2 g topically 3 (three) times daily as needed (knee pain). Apply to both knees   furosemide (LASIX) 20 MG tablet Take 20 mg by mouth. Once A Day on Mon, Wed, Fri   losartan (COZAAR) 50 MG tablet Take 1 tablet (50 mg total) by mouth daily.   metFORMIN (GLUCOPHAGE-XR) 500 MG 24 hr tablet Take 500 mg by mouth 2 (two) times daily. Take 1/2 tablet   metoprolol tartrate (LOPRESSOR) 25 MG tablet Take 12.5 mg by mouth 2 (two) times daily. Hold Metoprolol for SBP < 110 and DSP <50   mirtazapine (REMERON) 7.5 MG tablet Take 7.5 mg by mouth at bedtime. 1/2 tablet    Multiple Vitamins-Minerals (MULTIVITAMINS THER. W/MINERALS) TABS tablet Take 1 tablet by mouth daily. 400 mcg tablet   nitroGLYCERIN (NITROSTAT) 0.4 MG SL tablet Place 0.4 mg under the tongue every 5 (five) minutes as needed for chest pain. PLACE 0.4 MG UNDER TONGUE EVERY 5 MINUTES X3 FOR CHEST PAIN, IF NO RELIEF, CALL MD OR SEND TO ED FOR FURTHER EVALUATION.   potassium chloride (MICRO-K) 10 MEQ CR capsule Take 10 mEq by mouth. Once A Day on Mon, Wed, Fri   No  facility-administered encounter medications on file as of 01/06/2023.    Review of Systems  Constitutional:  Negative for appetite change, fatigue, fever and unexpected weight change.  HENT:  Positive for hearing loss. Negative for congestion and trouble swallowing.   Eyes:  Negative for visual disturbance.  Respiratory:  Negative for cough and shortness of breath.   Cardiovascular:  Positive for leg swelling. Negative for chest pain and palpitations.  Gastrointestinal: Negative.  Negative for abdominal pain, constipation and diarrhea.       Urge incontinent of bowel.   Genitourinary:  Negative for dysuria and urgency.  Musculoskeletal:  Positive for arthralgias and gait problem.       Left knee pain when walking too much.   Skin:  Negative for color change.  Neurological:  Negative for speech difficulty, weakness and light-headedness.  Psychiatric/Behavioral:  Negative for confusion and sleep disturbance. The patient is not nervous/anxious.     Immunization History  Administered Date(s) Administered   Influenza Whole 03/30/2012, 04/13/2013, 04/04/2018   Influenza, High Dose Seasonal PF 04/02/2019   Influenza-Unspecified 04/17/2014, 03/29/2015, 04/18/2021, 04/21/2022   Moderna SARS-COV2 Booster Vaccination 05/08/2020   Moderna Sars-Covid-2 Vaccination 07/02/2019, 07/30/2019, 11/27/2020   PFIZER Comirnaty(Gray Top)Covid-19 Tri-Sucrose Vaccine 03/19/2021   Pfizer Covid-19 Vaccine Bivalent Booster 15yrs & up 05/01/2022    Pneumococcal Conjugate-13 12/08/2017   Pneumococcal Polysaccharide-23 06/30/2001   Td 06/30/2001, 12/08/2017   Unspecified SARS-COV-2 Vaccination 05/01/2022   Pertinent  Health Maintenance Due  Topic Date Due   OPHTHALMOLOGY EXAM  08/29/2022   FOOT EXAM  10/23/2022   INFLUENZA VACCINE  01/29/2023   HEMOGLOBIN A1C  06/25/2023   DEXA SCAN  Completed      11/12/2020    7:45 AM 11/13/2020    2:00 AM 11/13/2020   10:00 AM 11/05/2021    7:37 PM 06/17/2022    9:10 AM  Fall Risk  Falls in the past year?     0  Was there an injury with Fall?     0  Fall Risk Category Calculator     0  Fall Risk Category (Retired)     Low  (RETIRED) Patient Fall Risk Level Moderate fall risk Moderate fall risk Moderate fall risk High fall risk Low fall risk  Patient at Risk for Falls Due to     No Fall Risks  Fall risk Follow up     Falls evaluation completed   Functional Status Survey:    Vitals:   01/06/23 1121  BP: (!) 130/58  Pulse: 65  Resp: 20  Temp: (!) 97.5 F (36.4 C)  SpO2: 98%  Weight: 105 lb (47.6 kg)  Height: 5\' 2"  (1.575 m)   Body mass index is 19.2 kg/m. Physical Exam Vitals and nursing note reviewed.  Constitutional:      Appearance: Normal appearance.  HENT:     Head: Normocephalic and atraumatic.     Mouth/Throat:     Mouth: Mucous membranes are moist.  Eyes:     Extraocular Movements: Extraocular movements intact.     Conjunctiva/sclera: Conjunctivae normal.     Pupils: Pupils are equal, round, and reactive to light.  Cardiovascular:     Rate and Rhythm: Normal rate and regular rhythm.     Heart sounds: No murmur heard.    Comments: DP pulses present R+L Pulmonary:     Effort: Pulmonary effort is normal.     Breath sounds: No rales.  Abdominal:     General: Bowel sounds are normal. There is  no distension.     Palpations: Abdomen is soft.     Tenderness: There is no abdominal tenderness. There is no right CVA tenderness, left CVA tenderness, guarding or rebound.   Genitourinary:    Comments: External hemorrhoids, no injury or bleeding.  Musculoskeletal:     Cervical back: Normal range of motion and neck supple.     Right lower leg: Edema present.     Left lower leg: Edema present.     Comments: Kyphoscoliosis. Minimal edema BLE  Skin:    General: Skin is warm and dry.     Comments: Yellow, thick, long toenails.   Neurological:     General: No focal deficit present.     Mental Status: She is alert. Mental status is at baseline.     Motor: No weakness.     Coordination: Coordination normal.     Gait: Gait abnormal.     Comments: Oriented to person, place.   Psychiatric:        Mood and Affect: Mood normal.        Behavior: Behavior normal.        Thought Content: Thought content normal.        Judgment: Judgment normal.     Labs reviewed: Recent Labs    01/30/22 0000 12/24/22 0000  NA 139 139  K 5.1 4.5  CL 111* 104  CO2 22 26*  BUN 27* 25*  CREATININE 1.2* 1.2*  CALCIUM 10.3 10.9*   Recent Labs    01/30/22 0000 12/24/22 0000  AST 17 21  ALT 12 15  ALKPHOS 58 57  ALBUMIN 3.5 4.1   Recent Labs    01/30/22 0000  WBC 7.0  NEUTROABS 3,423.00  HGB 10.4*  HCT 30*   Lab Results  Component Value Date   TSH 0.65 01/30/2022   Lab Results  Component Value Date   HGBA1C 6.9 12/24/2022   Lab Results  Component Value Date   CHOL 133 12/24/2022   HDL 59 12/24/2022   LDLCALC 58 12/24/2022   TRIG 80 12/24/2022   CHOLHDL 2.4 06/27/2018    Significant Diagnostic Results in last 30 days:  No results found.  Assessment/Plan Hypercalcemia Ca 11.7>>9.9(corrected to 10.7, PTH 35, s/p Zometa x1, PTHrP <2.0 11/11/20,  Hyperparathyroidism, workup with Dr. Talmage Nap, Ca 10.3 01/30/22<<10.9 12/24/22, off MVI with minerals 01/06/23  Diabetes due to underlying condition w diabetic nephropathy (HCC) takes Metformin, Hgb a1c 7.2 11/26/21>>6.9 12/24/22, Bun/creat 25/1.2 12/24/22  Essential hypertension Blood pressure is running low,  asymptomatic,  takes Losartan, Metoprolol, Amlodipine, f/u cardiology. Bp/P daily x 1 week.   Diastolic CHF (HCC)  chronic edema BLE, 04/16/21 Echo EF 40-45%, diastolic dysfunction. on Furosemide  Cognitive impairment  at her baseline.   Hyperlipidemia  takes Atorvastatin, LDL 58 12/24/22  Anemia Hgb 10.4 01/30/22    Family/ staff Communication: plan of care reviewed with the patient and charge nurse.   Labs/tests ordered:  none  Time spend 40 minutes.

## 2023-01-06 NOTE — Assessment & Plan Note (Signed)
takes Atorvastatin, LDL 58 12/24/22

## 2023-01-06 NOTE — Assessment & Plan Note (Addendum)
Blood pressure is running low, asymptomatic,  takes Losartan, Metoprolol, Amlodipine, f/u cardiology. Bp/P daily x 1 week.

## 2023-01-06 NOTE — Assessment & Plan Note (Signed)
takes Metformin, Hgb a1c 7.2 11/26/21>>6.9 12/24/22, Bun/creat 25/1.2 12/24/22

## 2023-01-06 NOTE — Assessment & Plan Note (Signed)
Hgb 10.4 01/30/22 

## 2023-02-06 ENCOUNTER — Encounter: Payer: Self-pay | Admitting: Family Medicine

## 2023-02-09 ENCOUNTER — Encounter: Payer: Self-pay | Admitting: Family Medicine

## 2023-04-13 ENCOUNTER — Non-Acute Institutional Stay: Payer: Medicare Other | Admitting: Orthopedic Surgery

## 2023-04-13 ENCOUNTER — Encounter: Payer: Self-pay | Admitting: Orthopedic Surgery

## 2023-04-13 DIAGNOSIS — R400 Somnolence: Secondary | ICD-10-CM | POA: Diagnosis not present

## 2023-04-13 DIAGNOSIS — R4189 Other symptoms and signs involving cognitive functions and awareness: Secondary | ICD-10-CM

## 2023-04-13 NOTE — Progress Notes (Signed)
Location:   [facility] Room Number: [id number] Place of Service:  ALF [phone]) Provider:  Hazle Nordmann, NP  PCP: Mast, Man X, NP  Patient Care Team: Mast, Man X, NP as PCP - General (Internal Medicine) [place] Mast, Man X, NP as Nurse Practitioner (Nurse Practitioner) Dorisann Frames, MD as Consulting Physician (Endocrinology) Nelson Chimes, MD as Consulting Physician (Ophthalmology) Dannielle Huh, MD as Consulting Physician (Orthopedic Surgery) Sheran Luz, MD as Consulting Physician (Physical Medicine and Rehabilitation) Maeola Harman, MD as Consulting Physician (Neurosurgery) Charna Elizabeth, MD as Consulting Physician (Gastroenterology) [place], Peter M, MD as Consulting Physician (Cardiology) Jerene Bears, MD as Consulting Physician (Gynecology)  Extended Emergency Contact Information Primary Emergency Contact: Iris Pert of [place] Phone: [phone] Relation: Niece Secondary Emergency Contact: Kizzie Bane Mobile Phone: [phone] Relation: Other Preferred language: [place] Interpreter needed? No Guardian: Rosanne Sack Mobile Phone: [phone] Relation: Legal Guardian Preferred language: English Interpreter needed? No  Code Status:   Goals of care: Advanced Directive information    04/13/2023   11:40 AM  Advanced Directives  Does Patient Have a Medical Advance Directive? Yes  Type of Estate agent of [place];Living will;Out of facility DNR (pink MOST or yellow form)  Does patient want to make changes to medical advance directive? No - Patient declined  Copy of Healthcare Power of Attorney in Chart? Yes - validated most recent copy scanned in chart (See row information)     Chief Complaint  Patient presents with   Acute Visit    Confusion    HPI:  Pt is a 89 y.o. female seen today for acute visit due to increased confusion.   She currently resides on the assisted living  unit at [facility]. PMH: CAD, diastolic CHF, HTN, NSTEMI, COPD, GERD, IBS, CKD, T2DM, OA, malnutrition, hypercalcemia,cognitive impairment, and unstable gait.    Nursing reports increased confusion in afternoon and increased somnolence/ lack of motivation. H/o cognitive impairment. No recent MMSE. She is on Remeron due to malnutrition. She is quite annoyed during our encounter. She will follow commands and able to answer simple questions. Alert to self/place, disoriented to time and situation. She denies pain, fever, fatigue, chest pain or shortness of breath. Afebrile. Vitals stable.   Ambulates with walker. No recent fall per chart review.   Past Medical History:  Diagnosis Date   Contact dermatitis and other eczema, due to unspecified cause    Cortical senile cataract    GERD (gastroesophageal reflux disease) 02/17/2013   Hyperparathyroidism, unspecified (HCC) 03/26/2010   Irritable bowel syndrome    Keratoconjunctivitis sicca, not specified as Sjogren's    Loss of weight    Macular degeneration (senile) of retina, unspecified    Memory loss    Other and unspecified hyperlipidemia    Other malaise and fatigue 06/17/2010   Pain in joint, site unspecified    Pathologic fracture of vertebrae    Reflux esophagitis    Scoliosis (and kyphoscoliosis), idiopathic    Senile osteoporosis    Type II or unspecified type diabetes mellitus without mention of complication, uncontrolled    Unspecified essential hypertension    Unspecified hereditary and idiopathic peripheral neuropathy    Unspecified vitamin D deficiency    Past Surgical History:  Procedure Laterality Date   APPENDECTOMY     BREAST LUMPECTOMY  02/05/2010   Dr. Elijah Birk [place]   CATARACT EXTRACTION W/ INTRAOCULAR LENS  IMPLANT, BILATERAL  04/2013   Dr. Hazle Quant  CHOLECYSTECTOMY  2004   Dr. Abbey Chatters   COLONOSCOPY  2001   Dr. Loreta Ave   fibroidectomy  1950   HEMORRHOID SURGERY  1978   KYPHOPLASTY  2010   T10 & T12 Dr.  Maeola Harman   MEDIAL PARTIAL KNEE REPLACEMENT Left 2004   Dr. Sherlean Foot   SPINE SURGERY     vertebroplasty T10, T12   SPINE SURGERY  11/02/2001    C4-5 & C5-6 Titanium Plate Placed Dr. Channing Mutters   THYROIDECTOMY, PARTIAL Left 2007   TOTAL KNEE ARTHROPLASTY Right 2009   Dr. Charlann Boxer     Allergies  Allergen Reactions   Bactrim     unknown   Betadine [Povidone Iodine]     unknown   Metformin And Related     Hurt stomach   Nutmeg Oil (Myristica Oil)     unknown   Prednisone    Sulfa Antibiotics     unknown    Allergies as of 04/13/2023       Reactions   Bactrim    unknown   Betadine [povidone Iodine]    unknown   Metformin And Related    Hurt stomach   Nutmeg Oil (myristica Oil)    unknown   Prednisone    Sulfa Antibiotics    unknown        Medication List        Accurate as of April 13, 2023 11:42 AM. If you have any questions, ask your nurse or doctor.          STOP taking these medications    multivitamins ther. w/minerals Tabs tablet Stopped by: Luberta Robertson Dozier Berkovich       TAKE these medications    acetaminophen 325 MG tablet Commonly known as: TYLENOL Take 650 mg by mouth every 6 (six) hours as needed for mild pain. Three times a day   amLODipine 5 MG tablet Commonly known as: NORVASC Take 2.5 mg by mouth daily.   aspirin EC 81 MG tablet Take 1 tablet (81 mg total) by mouth daily.   atorvastatin 10 MG tablet Commonly known as: LIPITOR Take 10 mg by mouth daily.   betamethasone dipropionate 0.05 % ointment Commonly known as: DIPROLENE Apply topically. Apply BID PRN to active rash on extremities . 1 app immediately after shower. Twice A Day - PRN   diclofenac Sodium 1 % Gel Commonly known as: VOLTAREN Apply 2 g topically 2 (two) times daily as needed (knee pain). Apply to both knees   furosemide 20 MG tablet Commonly known as: LASIX Take 20 mg by mouth. Once A Day on Mon, Wed, Fri   losartan 50 MG tablet Commonly known as: Cozaar Take 1 tablet (50  mg total) by mouth daily.   metFORMIN 500 MG 24 hr tablet Commonly known as: GLUCOPHAGE-XR Take 500 mg by mouth 2 (two) times daily. Take 1/2 tablet   metoprolol tartrate 25 MG tablet Commonly known as: LOPRESSOR Take 12.5 mg by mouth 2 (two) times daily. Hold Metoprolol for SBP < 110 and DSP <50   mirtazapine 7.5 MG tablet Commonly known as: REMERON Take 7.5 mg by mouth at bedtime. 1/2 tablet   nitroGLYCERIN 0.4 MG SL tablet Commonly known as: NITROSTAT Place 0.4 mg under the tongue every 5 (five) minutes as needed for chest pain. PLACE 0.4 MG UNDER TONGUE EVERY 5 MINUTES X3 FOR CHEST PAIN, IF NO RELIEF, CALL MD OR SEND TO ED FOR FURTHER EVALUATION.   potassium chloride 10 MEQ CR capsule Commonly known  as: MICRO-K Take 10 mEq by mouth. Once A Day on Mon, Wed, Fri        Review of Systems  Constitutional:  Positive for activity change and fatigue. Negative for appetite change.  HENT:  Negative for congestion, sore throat and trouble swallowing.   Respiratory:  Negative for cough, shortness of breath and wheezing.   Cardiovascular:  Negative for chest pain and leg swelling.  Gastrointestinal:  Negative for abdominal distention and abdominal pain.  Genitourinary:  Negative for dysuria and frequency.  Musculoskeletal:  Positive for gait problem.  Skin:  Negative for wound.  Neurological:  Positive for weakness. Negative for dizziness and headaches.  Psychiatric/Behavioral:  Positive for confusion and dysphoric mood. Negative for sleep disturbance. The patient is not nervous/anxious.     Immunization History  Administered Date(s) Administered   Influenza Whole 03/30/2012, 04/13/2013, 04/04/2018   Influenza, High Dose Seasonal PF 04/02/2019   Influenza-Unspecified 04/17/2014, 03/29/2015, 04/18/2021, 04/21/2022   Moderna SARS-COV2 Booster Vaccination 05/08/2020   Moderna Sars-Covid-2 Vaccination 07/02/2019, 07/30/2019, 11/27/2020   PFIZER Comirnaty(Gray Top)Covid-19  Tri-Sucrose Vaccine 03/19/2021   Pfizer Covid-19 Vaccine Bivalent Booster 50yrs & up 05/01/2022   Pneumococcal Conjugate-13 12/08/2017   Pneumococcal Polysaccharide-23 06/30/2001   Td 06/30/2001, 12/08/2017   Unspecified SARS-COV-2 Vaccination 05/01/2022   Pertinent  Health Maintenance Due  Topic Date Due   OPHTHALMOLOGY EXAM  08/29/2022   FOOT EXAM  10/23/2022   INFLUENZA VACCINE  01/29/2023   HEMOGLOBIN A1C  06/25/2023   DEXA SCAN  Completed      11/12/2020    7:45 AM 11/13/2020    2:00 AM 11/13/2020   10:00 AM 11/05/2021    7:37 PM 06/17/2022    9:10 AM  Fall Risk  Falls in the past year?     0  Was there an injury with Fall?     0  Fall Risk Category Calculator     0  Fall Risk Category (Retired)     Low  (RETIRED) Patient Fall Risk Level Moderate fall risk Moderate fall risk Moderate fall risk High fall risk Low fall risk  Patient at Risk for Falls Due to     No Fall Risks  Fall risk Follow up     Falls evaluation completed   Functional Status Survey:    Vitals:   04/13/23 1137  BP: 138/64  Pulse: 73  Resp: 18  Temp: 97.8 F (36.6 C)  SpO2: 95%  Weight: 101 lb 3.2 oz (45.9 kg)  Height: 5\' 2"  (1.575 m)   Body mass index is 18.51 kg/m. Physical Exam Vitals reviewed.  Constitutional:      General: She is not in acute distress.    Appearance: She is underweight.  HENT:     Head: Normocephalic.  Eyes:     General:        Right eye: No discharge.        Left eye: No discharge.  Cardiovascular:     Rate and Rhythm: Normal rate and regular rhythm.     Pulses: Normal pulses.     Heart sounds: Normal heart sounds.  Pulmonary:     Effort: Pulmonary effort is normal. No respiratory distress.     Breath sounds: Normal breath sounds. No wheezing, rhonchi or rales.  Abdominal:     General: Bowel sounds are normal.     Palpations: Abdomen is soft.  Musculoskeletal:     Cervical back: Neck supple.     Right lower leg: No edema.  Left lower leg: No edema.   Skin:    General: Skin is warm.     Capillary Refill: Capillary refill takes less than 2 seconds.  Neurological:     General: No focal deficit present.     Mental Status: She is alert. Mental status is at baseline.     Motor: Weakness present.     Gait: Gait abnormal.  Psychiatric:        Mood and Affect: Mood normal.     Labs reviewed: Recent Labs    12/24/22 0000  NA 139  K 4.5  CL 104  CO2 26*  BUN 25*  CREATININE 1.2*  CALCIUM 10.9*   Recent Labs    12/24/22 0000  AST 21  ALT 15  ALKPHOS 57  ALBUMIN 4.1   No results for input(s): "WBC", "NEUTROABS", "HGB", "HCT", "MCV", "PLT" in the last 8760 hours. Lab Results  Component Value Date   TSH 0.65 01/30/2022   Lab Results  Component Value Date   HGBA1C 6.9 12/24/2022   Lab Results  Component Value Date   CHOL 133 12/24/2022   HDL 59 12/24/2022   LDLCALC 58 12/24/2022   TRIG 80 12/24/2022   CHOLHDL 2.4 06/27/2018    Significant Diagnostic Results in last 30 days:  No results found.  Assessment/Plan 1. Cognitive impairment - increased confusion in afternoon, lack of motivation - exam unremarkable  - alert to self/place, disorientated to time/situation - ? Increased sundowning/ dementia progression  - cbc/diff, bmp and UA/culture to r/o infection   2. Somnolence - see above - alert today - ? Dementia progression versus lack of motivation - on low dose Remeron    Family/ staff Communication: plan discussed with patient and nursing   Labs/tests ordered:  cbc/diff, bmp, UA/culture

## 2023-04-17 ENCOUNTER — Non-Acute Institutional Stay: Payer: Self-pay | Admitting: Adult Health

## 2023-04-17 ENCOUNTER — Other Ambulatory Visit: Payer: Self-pay | Admitting: Adult Health

## 2023-04-17 ENCOUNTER — Encounter: Payer: Self-pay | Admitting: Adult Health

## 2023-04-17 DIAGNOSIS — N390 Urinary tract infection, site not specified: Secondary | ICD-10-CM

## 2023-04-17 MED ORDER — AMOXICILLIN-POT CLAVULANATE 875-125 MG PO TABS: 1.0000 | ORAL_TABLET | Freq: Two times a day (BID) | ORAL | 0 refills | Status: AC

## 2023-04-17 NOTE — Progress Notes (Unsigned)
Location:  [facility] Room Number: 814-A Place of Service:  [phone]) Provider:  Kenard Gower, DNP, FNP-BC  Patient Care Team: Mast, Man X, NP as PCP - General (Internal Medicine) [place] Mast, Man X, NP as Nurse Practitioner (Nurse Practitioner) Dorisann Frames, MD as Consulting Physician (Endocrinology) Nelson Chimes, MD as Consulting Physician (Ophthalmology) Dannielle Huh, MD as Consulting Physician (Orthopedic Surgery) Sheran Luz, MD as Consulting Physician (Physical Medicine and Rehabilitation) Maeola Harman, MD as Consulting Physician (Neurosurgery) Charna Elizabeth, MD as Consulting Physician (Gastroenterology) [place], Peter M, MD as Consulting Physician (Cardiology) Jerene Bears, MD as Consulting Physician (Gynecology)  Extended Emergency Contact Information Primary Emergency Contact: Iris Pert of [place] Phone: [phone] Relation: Niece Secondary Emergency Contact: Kizzie Bane Mobile Phone: [phone] Relation: Other Preferred language: [place] Interpreter needed? No Guardian: Rosanne Sack Mobile Phone: [phone] Relation: Legal Guardian Preferred language: English Interpreter needed? No  Code Status:  DNR  Goals of care: Advanced Directive information    04/17/2023    2:49 PM  Advanced Directives  Does Patient Have a Medical Advance Directive? Yes  Type of Estate agent of [place];Living will;Out of facility DNR (pink MOST or yellow form)  Does patient want to make changes to medical advance directive? No - Patient declined  Copy of Healthcare Power of Attorney in Chart? Yes - validated most recent copy scanned in chart (See row information)     Chief Complaint  Patient presents with   Acute Visit    UTI    HPI:  Pt is a 89 y.o. female seen today for medical management of chronic diseases.  ***   Past Medical History:  Diagnosis Date    Contact dermatitis and other eczema, due to unspecified cause    Cortical senile cataract    GERD (gastroesophageal reflux disease) 02/17/2013   Hyperparathyroidism, unspecified (HCC) 03/26/2010   Irritable bowel syndrome    Keratoconjunctivitis sicca, not specified as Sjogren's    Loss of weight    Macular degeneration (senile) of retina, unspecified    Memory loss    Other and unspecified hyperlipidemia    Other malaise and fatigue 06/17/2010   Pain in joint, site unspecified    Pathologic fracture of vertebrae    Reflux esophagitis    Scoliosis (and kyphoscoliosis), idiopathic    Senile osteoporosis    Type II or unspecified type diabetes mellitus without mention of complication, uncontrolled    Unspecified essential hypertension    Unspecified hereditary and idiopathic peripheral neuropathy    Unspecified vitamin D deficiency    Past Surgical History:  Procedure Laterality Date   APPENDECTOMY     BREAST LUMPECTOMY  02/05/2010   Dr. Elijah Birk Cornett   CATARACT EXTRACTION W/ INTRAOCULAR LENS  IMPLANT, BILATERAL  04/2013   Dr. Hazle Quant   CHOLECYSTECTOMY  2004   Dr. Abbey Chatters   COLONOSCOPY  2001   Dr. Loreta Ave   fibroidectomy  1950   HEMORRHOID SURGERY  1978   KYPHOPLASTY  2010   T10 & T12 Dr. Maeola Harman   MEDIAL PARTIAL KNEE REPLACEMENT Left 2004   Dr. Sherlean Foot   SPINE SURGERY     vertebroplasty T10, T12   SPINE SURGERY  11/02/2001    C4-5 & C5-6 Titanium Plate Placed Dr. Channing Mutters   THYROIDECTOMY, PARTIAL Left 2007   TOTAL KNEE ARTHROPLASTY Right 2009   Dr. Charlann Boxer     Allergies  Allergen Reactions   Bactrim  unknown   Betadine [Povidone Iodine]     unknown   Metformin And Related     Hurt stomach   Nutmeg Oil (Myristica Oil)     unknown   Prednisone    Sulfa Antibiotics     unknown    Outpatient Encounter Medications as of 04/17/2023  Medication Sig   acetaminophen (TYLENOL) 325 MG tablet Take 650 mg by mouth every 6 (six) hours as needed for mild pain. Three times a day    amLODipine (NORVASC) 5 MG tablet Take 2.5 mg by mouth daily.   amoxicillin-clavulanate (AUGMENTIN) 875-125 MG tablet Take 1 tablet by mouth 2 (two) times daily for 5 days.   aspirin EC 81 MG EC tablet Take 1 tablet (81 mg total) by mouth daily.   atorvastatin (LIPITOR) 10 MG tablet Take 10 mg by mouth daily.   betamethasone dipropionate (DIPROLENE) 0.05 % ointment Apply topically. Apply BID PRN to active rash on extremities . 1 app immediately after shower. Twice A Day - PRN   diclofenac Sodium (VOLTAREN) 1 % GEL Apply 2 g topically 2 (two) times daily as needed (knee pain). Apply to both knees   furosemide (LASIX) 20 MG tablet Take 20 mg by mouth. Once A Day on Mon, Wed, Fri   losartan (COZAAR) 50 MG tablet Take 1 tablet (50 mg total) by mouth daily.   metFORMIN (GLUCOPHAGE-XR) 500 MG 24 hr tablet Take 500 mg by mouth 2 (two) times daily. Take 1/2 tablet   metoprolol tartrate (LOPRESSOR) 25 MG tablet Take 12.5 mg by mouth 2 (two) times daily. Hold Metoprolol for SBP < 110 and DSP <50   mirtazapine (REMERON) 7.5 MG tablet Take 7.5 mg by mouth at bedtime. 1/2 tablet   nitroGLYCERIN (NITROSTAT) 0.4 MG SL tablet Place 0.4 mg under the tongue every 5 (five) minutes as needed for chest pain. PLACE 0.4 MG UNDER TONGUE EVERY 5 MINUTES X3 FOR CHEST PAIN, IF NO RELIEF, CALL MD OR SEND TO ED FOR FURTHER EVALUATION.   potassium chloride (MICRO-K) 10 MEQ CR capsule Take 10 mEq by mouth. Once A Day on Mon, Wed, Fri   No facility-administered encounter medications on file as of 04/17/2023.    Review of Systems ***    Immunization History  Administered Date(s) Administered   Fluad Quad(high Dose 65+) 04/01/2023   Influenza Whole 03/30/2012, 04/13/2013, 04/04/2018   Influenza, High Dose Seasonal PF 04/02/2019   Influenza-Unspecified 04/17/2014, 03/29/2015, 04/18/2021, 04/21/2022   Moderna SARS-COV2 Booster Vaccination 05/08/2020   Moderna Sars-Covid-2 Vaccination 07/02/2019, 07/30/2019, 11/27/2020    PFIZER Comirnaty(Gray Top)Covid-19 Tri-Sucrose Vaccine 03/19/2021   Pfizer Covid-19 Vaccine Bivalent Booster 37yrs & up 05/01/2022   Pneumococcal Conjugate-13 12/08/2017   Pneumococcal Polysaccharide-23 06/30/2001   Td 06/30/2001, 12/08/2017   Unspecified SARS-COV-2 Vaccination 05/01/2022   Pertinent  Health Maintenance Due  Topic Date Due   OPHTHALMOLOGY EXAM  08/29/2022   FOOT EXAM  10/23/2022   HEMOGLOBIN A1C  06/25/2023   INFLUENZA VACCINE  Completed   DEXA SCAN  Completed      11/12/2020    7:45 AM 11/13/2020    2:00 AM 11/13/2020   10:00 AM 11/05/2021    7:37 PM 06/17/2022    9:10 AM  Fall Risk  Falls in the past year?     0  Was there an injury with Fall?     0  Fall Risk Category Calculator     0  Fall Risk Category (Retired)     Low  (RETIRED)  Patient Fall Risk Level Moderate fall risk Moderate fall risk Moderate fall risk High fall risk Low fall risk  Patient at Risk for Falls Due to     No Fall Risks  Fall risk Follow up     Falls evaluation completed     Vitals:   04/17/23 1445  BP: 122/60  Pulse: 72  Resp: 16  Temp: 98.1 F (36.7 C)  SpO2: 96%  Weight: 101 lb 3.2 oz (45.9 kg)  Height: 5\' 2"  (1.575 m)   Body mass index is 18.51 kg/m.  Physical Exam     Labs reviewed: Recent Labs    12/24/22 0000  NA 139  K 4.5  CL 104  CO2 26*  BUN 25*  CREATININE 1.2*  CALCIUM 10.9*   Recent Labs    12/24/22 0000  AST 21  ALT 15  ALKPHOS 57  ALBUMIN 4.1   No results for input(s): "WBC", "NEUTROABS", "HGB", "HCT", "MCV", "PLT" in the last 8760 hours. Lab Results  Component Value Date   TSH 0.65 01/30/2022   Lab Results  Component Value Date   HGBA1C 6.9 12/24/2022   Lab Results  Component Value Date   CHOL 133 12/24/2022   HDL 59 12/24/2022   LDLCALC 58 12/24/2022   TRIG 80 12/24/2022   CHOLHDL 2.4 06/27/2018    Significant Diagnostic Results in last 30 days:  No results found.  Assessment/Plan ***   Family/ staff Communication:  Discussed plan of care with resident and charge nurse  Labs/tests ordered:  None    Kenard Gower, DNP, MSN, FNP-BC [facility] [phone] (Monday-Friday 8:00 a.m. - 5:00 p.m.) [phone] (after hours)

## 2023-04-24 ENCOUNTER — Encounter: Payer: Self-pay | Admitting: Sports Medicine

## 2023-04-24 ENCOUNTER — Non-Acute Institutional Stay: Payer: Medicare Other | Admitting: Sports Medicine

## 2023-04-24 DIAGNOSIS — R634 Abnormal weight loss: Secondary | ICD-10-CM

## 2023-04-24 DIAGNOSIS — E0821 Diabetes mellitus due to underlying condition with diabetic nephropathy: Secondary | ICD-10-CM | POA: Diagnosis not present

## 2023-04-24 DIAGNOSIS — I5032 Chronic diastolic (congestive) heart failure: Secondary | ICD-10-CM

## 2023-04-24 DIAGNOSIS — F0390 Unspecified dementia without behavioral disturbance: Secondary | ICD-10-CM | POA: Diagnosis not present

## 2023-04-24 DIAGNOSIS — I10 Essential (primary) hypertension: Secondary | ICD-10-CM

## 2023-04-24 DIAGNOSIS — N183 Chronic kidney disease, stage 3 unspecified: Secondary | ICD-10-CM

## 2023-04-24 DIAGNOSIS — E1122 Type 2 diabetes mellitus with diabetic chronic kidney disease: Secondary | ICD-10-CM

## 2023-04-24 NOTE — Addendum Note (Signed)
Addended by: Venita Sheffield on: 04/24/2023 02:01 PM   Modules accepted: Level of Service

## 2023-04-24 NOTE — Progress Notes (Signed)
Provider: Andree Coss Location:   [facility] [place]   Place of Service:   Assisted living room 814   10.58  PCP: Mast, Man X, NP Patient Care Team: Mast, Man X, NP as PCP - General (Internal Medicine) [place] Mast, Man X, NP as Nurse Practitioner (Nurse Practitioner) Dorisann Frames, MD as Consulting Physician (Endocrinology) Nelson Chimes, MD as Consulting Physician (Ophthalmology) Dannielle Huh, MD as Consulting Physician (Orthopedic Surgery) Sheran Luz, MD as Consulting Physician (Physical Medicine and Rehabilitation) Maeola Harman, MD as Consulting Physician (Neurosurgery) Charna Elizabeth, MD as Consulting Physician (Gastroenterology) [place], Peter M, MD as Consulting Physician (Cardiology) Jerene Bears, MD as Consulting Physician (Gynecology)  Extended Emergency Contact Information Primary Emergency Contact: Iris Pert of [place] Phone: [phone] Relation: Niece Secondary Emergency Contact: Kizzie Bane Mobile Phone: [phone] Relation: Other Preferred language: [place] Interpreter needed? No Guardian: Rosanne Sack Mobile Phone: [phone] Relation: Legal Guardian Preferred language: English Interpreter needed? No  Code Status:  Goals of Care: Advanced Directive information    04/17/2023    2:49 PM  Advanced Directives  Does Patient Have a Medical Advance Directive? Yes  Type of Estate agent of [place];Living will;Out of facility DNR (pink MOST or yellow form)  Does patient want to make changes to medical advance directive? No - Patient declined  Copy of Healthcare Power of Attorney in Chart? Yes - validated most recent copy scanned in chart (See row information)      No chief complaint on file.   HPI: Patient is a 89 y.o. female seen today for routine visit for chronic disease management.  Pt seen and examined in her room, she is sitting comfortably in her chair  watching TV.  She knows her name, knows that she is at [facility]  but not oriented to time . As per nursing staff pt was confused and had UTI 2 weeks ago but back to baseline now. Has intermittent confusion and memory deficits.  She is able to ambulate with her walker and eats her meals at the dining hall. She is able to do most of her ADLS independently but has a sitter with her for couple of hours. No agitation as per nursing staff  She does not eat much, she is currently on rameron. Lost 8 lb since May.    Past Medical History:  Diagnosis Date   Contact dermatitis and other eczema, due to unspecified cause    Cortical senile cataract    GERD (gastroesophageal reflux disease) 02/17/2013   Hyperparathyroidism, unspecified (HCC) 03/26/2010   Irritable bowel syndrome    Keratoconjunctivitis sicca, not specified as Sjogren's    Loss of weight    Macular degeneration (senile) of retina, unspecified    Memory loss    Other and unspecified hyperlipidemia    Other malaise and fatigue 06/17/2010   Pain in joint, site unspecified    Pathologic fracture of vertebrae    Reflux esophagitis    Scoliosis (and kyphoscoliosis), idiopathic    Senile osteoporosis    Type II or unspecified type diabetes mellitus without mention of complication, uncontrolled    Unspecified essential hypertension    Unspecified hereditary and idiopathic peripheral neuropathy    Unspecified vitamin D deficiency    Past Surgical History:  Procedure Laterality Date   APPENDECTOMY     BREAST LUMPECTOMY  02/05/2010   Dr. Elijah Birk Cornett   CATARACT EXTRACTION W/ INTRAOCULAR LENS  IMPLANT, BILATERAL  04/2013   Dr. Hazle Quant  CHOLECYSTECTOMY  2004   Dr. Abbey Chatters   COLONOSCOPY  2001   Dr. Loreta Ave   fibroidectomy  1950   HEMORRHOID SURGERY  1978   KYPHOPLASTY  2010   T10 & T12 Dr. Maeola Harman   MEDIAL PARTIAL KNEE REPLACEMENT Left 2004   Dr. Sherlean Foot   SPINE SURGERY     vertebroplasty T10, T12   SPINE SURGERY  11/02/2001     C4-5 & C5-6 Titanium Plate Placed Dr. Channing Mutters   THYROIDECTOMY, PARTIAL Left 2007   TOTAL KNEE ARTHROPLASTY Right 2009   Dr. Charlann Boxer     reports that she has never smoked. She has never used smokeless tobacco. She reports that she does not drink alcohol and does not use drugs. Social History   Socioeconomic History   Marital status: Widowed    Spouse name: Not on file   Number of children: 0   Years of education: Not on file   Highest education level: Not on file  Occupational History   Occupation: retired Insurance underwriter: RETIRED  Tobacco Use   Smoking status: Never   Smokeless tobacco: Never  Vaping Use   Vaping status: Never Used  Substance and Sexual Activity   Alcohol use: No   Drug use: No   Sexual activity: Never    Partners: Male    Birth control/protection: Post-menopausal  Other Topics Concern   Not on file  Social History Narrative   Lives at [facility] [place] since 02/06/2009   Widowed    No childred   Living Will   Social Determinants of Health   Financial Resource Strain: Low Risk  (11/24/2017)   Overall Financial Resource Strain (CARDIA)    Difficulty of Paying Living Expenses: Not hard at all  Food Insecurity: No Food Insecurity (11/24/2017)   Hunger Vital Sign    Worried About Running Out of Food in the Last Year: Never true    Ran Out of Food in the Last Year: Never true  Transportation Needs: No Transportation Needs (11/24/2017)   PRAPARE - Administrator, Civil Service (Medical): No    Lack of Transportation (Non-Medical): No  Physical Activity: Insufficiently Active (11/24/2017)   Exercise Vital Sign    Days of Exercise per Week: 7 days    Minutes of Exercise per Session: 20 min  Stress: No Stress Concern Present (11/24/2017)   [employer] of Occupational Health - Occupational Stress Questionnaire    Feeling of Stress : Only a little  Social Connections: Moderately Isolated (11/24/2017)   Social Connection and  Isolation Panel [NHANES]    Frequency of Communication with Friends and Family: More than three times a week    Frequency of Social Gatherings with Friends and Family: More than three times a week    Attends Religious Services: Never    Database administrator or Organizations: No    Attends Banker Meetings: Never    Marital Status: Widowed  Intimate Partner Violence: Not At Risk (11/24/2017)   Humiliation, Afraid, Rape, and Kick questionnaire    Fear of Current or Ex-Partner: No    Emotionally Abused: No    Physically Abused: No    Sexually Abused: No    Functional Status Survey:    Family History  Problem Relation Age of Onset   Diabetes Mother    Heart disease Mother        CHF   Stroke Father    Heart disease Sister  MI   Colon cancer Neg Hx    Stomach cancer Neg Hx    Esophageal cancer Neg Hx    Rectal cancer Neg Hx    Liver cancer Neg Hx     Health Maintenance  Topic Date Due   OPHTHALMOLOGY EXAM  08/29/2022   FOOT EXAM  10/23/2022   COVID-19 Vaccine (7 - 2023-24 season) 03/01/2023   Medicare Annual Wellness (AWV)  06/18/2023   HEMOGLOBIN A1C  06/25/2023   DTaP/Tdap/Td (3 - Tdap) 12/09/2027   Pneumonia Vaccine 95+ Years old  Completed   INFLUENZA VACCINE  Completed   DEXA SCAN  Completed   HPV VACCINES  Aged Out   Zoster Vaccines- Shingrix  Discontinued    Allergies  Allergen Reactions   Bactrim     unknown   Betadine [Povidone Iodine]     unknown   Metformin And Related     Hurt stomach   Nutmeg Oil (Myristica Oil)     unknown   Prednisone    Sulfa Antibiotics     unknown    Outpatient Encounter Medications as of 04/24/2023  Medication Sig   acetaminophen (TYLENOL) 325 MG tablet Take 650 mg by mouth every 6 (six) hours as needed for mild pain. Three times a day   amLODipine (NORVASC) 5 MG tablet Take 2.5 mg by mouth daily.   aspirin EC 81 MG EC tablet Take 1 tablet (81 mg total) by mouth daily.   atorvastatin (LIPITOR) 10 MG  tablet Take 10 mg by mouth daily.   betamethasone dipropionate (DIPROLENE) 0.05 % ointment Apply topically. Apply BID PRN to active rash on extremities . 1 app immediately after shower. Twice A Day - PRN   diclofenac Sodium (VOLTAREN) 1 % GEL Apply 2 g topically 2 (two) times daily as needed (knee pain). Apply to both knees   furosemide (LASIX) 20 MG tablet Take 20 mg by mouth. Once A Day on Mon, Wed, Fri   losartan (COZAAR) 50 MG tablet Take 1 tablet (50 mg total) by mouth daily.   metFORMIN (GLUCOPHAGE-XR) 500 MG 24 hr tablet Take 500 mg by mouth 2 (two) times daily. Take 1/2 tablet   metoprolol tartrate (LOPRESSOR) 25 MG tablet Take 12.5 mg by mouth 2 (two) times daily. Hold Metoprolol for SBP < 110 and DSP <50   mirtazapine (REMERON) 7.5 MG tablet Take 7.5 mg by mouth at bedtime. 1/2 tablet   nitroGLYCERIN (NITROSTAT) 0.4 MG SL tablet Place 0.4 mg under the tongue every 5 (five) minutes as needed for chest pain. PLACE 0.4 MG UNDER TONGUE EVERY 5 MINUTES X3 FOR CHEST PAIN, IF NO RELIEF, CALL MD OR SEND TO ED FOR FURTHER EVALUATION.   potassium chloride (MICRO-K) 10 MEQ CR capsule Take 10 mEq by mouth. Once A Day on Mon, Wed, Fri   No facility-administered encounter medications on file as of 04/24/2023.    Review of Systems  Constitutional:  Negative for fever.  HENT:  Negative for sore throat.   Respiratory:  Negative for cough, shortness of breath and wheezing.   Cardiovascular:  Negative for chest pain, palpitations and leg swelling.  Gastrointestinal:  Negative for abdominal distention, abdominal pain, blood in stool, constipation, diarrhea, nausea and vomiting.  Genitourinary:  Negative for dysuria, frequency and urgency.  Neurological:  Negative for dizziness, weakness and numbness.  Psychiatric/Behavioral:  Positive for confusion. Negative for agitation and hallucinations.     There were no vitals filed for this visit. There is no height or weight  on file to calculate  BMI. Physical Exam Constitutional:      Appearance: Normal appearance.  HENT:     Head: Normocephalic and atraumatic.  Cardiovascular:     Rate and Rhythm: Normal rate and regular rhythm.     Heart sounds: No murmur heard. Pulmonary:     Effort: Pulmonary effort is normal. No respiratory distress.     Breath sounds: Normal breath sounds. No wheezing.  Abdominal:     General: Bowel sounds are normal. There is no distension.     Tenderness: There is no abdominal tenderness. There is no guarding or rebound.  Musculoskeletal:        General: No swelling or tenderness.  Skin:    General: Skin is dry.  Neurological:     Mental Status: She is alert. Mental status is at baseline.     Sensory: No sensory deficit.     Motor: No weakness.     Labs reviewed: Basic Metabolic Panel: Recent Labs    12/24/22 0000  NA 139  K 4.5  CL 104  CO2 26*  BUN 25*  CREATININE 1.2*  CALCIUM 10.9*   Liver Function Tests: Recent Labs    12/24/22 0000  AST 21  ALT 15  ALKPHOS 57  ALBUMIN 4.1   No results for input(s): "LIPASE", "AMYLASE" in the last 8760 hours. No results for input(s): "AMMONIA" in the last 8760 hours. CBC: No results for input(s): "WBC", "NEUTROABS", "HGB", "HCT", "MCV", "PLT" in the last 8760 hours. Cardiac Enzymes: No results for input(s): "CKTOTAL", "CKMB", "CKMBINDEX", "TROPONINI" in the last 8760 hours. BNP: Invalid input(s): "POCBNP" Lab Results  Component Value Date   HGBA1C 6.9 12/24/2022   Lab Results  Component Value Date   TSH 0.65 01/30/2022   Lab Results  Component Value Date   VITAMINB12 601 12/24/2022   Lab Results  Component Value Date   FOLATE 44.1 11/13/2020   Lab Results  Component Value Date   IRON 70 11/13/2020   TIBC 307 11/13/2020   FERRITIN 22 11/13/2020    Imaging and Procedures obtained prior to SNF admission: DG Chest 1 View  Result Date: 11/05/2021 CLINICAL DATA:  Fall, left arm pain EXAM: CHEST  1 VIEW COMPARISON:  None  Available. FINDINGS: Lungs are clear. No pneumothorax or pleural effusion. Cardiac size within normal limits. Pulmonary vascularity is normal. Acute three-part fracture of the left humeral head is seen with fractures involving the greater tuberosity and surgical neck of the humerus. T10 and T12 vertebroplasty has been performed. IMPRESSION: Acute three-part fracture of the left humeral head. No radiographic evidence of acute cardiopulmonary disease. Electronically Signed   By: Helyn Numbers M.D.   On: 11/05/2021 20:45   DG Forearm Left  Result Date: 11/05/2021 CLINICAL DATA:  Fall, left arm pain EXAM: LEFT FOREARM - 2 VIEW COMPARISON:  None Available. FINDINGS: Normal alignment. No acute fracture or dislocation. Chondrocalcinosis within the radiocarpal articulation is likely degenerative in nature. Soft tissues are unremarkable. IMPRESSION: No acute fracture or dislocation. Electronically Signed   By: Helyn Numbers M.D.   On: 11/05/2021 20:43   DG Pelvis 1-2 Views  Result Date: 11/05/2021 CLINICAL DATA:  Fall, pelvic injury EXAM: PELVIS - 1-2 VIEW COMPARISON:  None Available. FINDINGS: Normal alignment. No acute fracture or dislocation. Moderate bilateral degenerative hip arthritis with joint space narrowing noted. Densely calcified fibroid noted within the pelvis. Soft tissues are otherwise unremarkable. IMPRESSION: No acute fracture or dislocation. Electronically Signed   By: Gloris Ham  Ramiro Harvest M.D.   On: 11/05/2021 20:42   DG Humerus Left  Result Date: 11/05/2021 CLINICAL DATA:  Fall, left arm pain EXAM: LEFT HUMERUS - 2+ VIEW COMPARISON:  None Available. FINDINGS: There is an acute fracture involving the greater tuberosity of the left humeral head which appears posterolaterally displaced. There is a subtle lucency extending across the surgical neck of the humerus and a nondisplaced fracture of the surgical neck is not excluded. Humeral head appears seated within the glenoid fossa. The distal humerus is  intact. IMPRESSION: Fractures of the greater tuberosity of the left humeral head and possibly the surgical neck of the left humerus. This could be better assessed with dedicated CT imaging. Electronically Signed   By: Helyn Numbers M.D.   On: 11/05/2021 20:41   CT Cervical Spine Wo Contrast  Result Date: 11/05/2021 CLINICAL DATA:  Neck trauma (Age >= 65y).  Fall. EXAM: CT CERVICAL SPINE WITHOUT CONTRAST TECHNIQUE: Multidetector CT imaging of the cervical spine was performed without intravenous contrast. Multiplanar CT image reconstructions were also generated. RADIATION DOSE REDUCTION: This exam was performed according to the departmental dose-optimization program which includes automated exposure control, adjustment of the mA and/or kV according to patient size and/or use of iterative reconstruction technique. COMPARISON:  None Available. FINDINGS: Alignment: Grade 1 degenerative anterolisthesis of C6 on C7 and C7 on T1. Skull base and vertebrae: No acute fracture. No primary bone lesion or focal pathologic process. Soft tissues and spinal canal: Choose 1 Disc levels: Prior anterior fusion from C4-C6. Advanced degenerative disc disease at C6-7 and C2-3. Bilateral moderate degenerative facet disease. Upper chest: Biapical scarring. Other: None IMPRESSION: Degenerative disc and facet disease. Prior ACDF C4-C6. No acute bony abnormality. Electronically Signed   By: Charlett Nose M.D.   On: 11/05/2021 20:27   CT Maxillofacial Wo Contrast  Result Date: 11/05/2021 CLINICAL DATA:  Facial trauma, blunt.  Fall. EXAM: CT MAXILLOFACIAL WITHOUT CONTRAST TECHNIQUE: Multidetector CT imaging of the maxillofacial structures was performed. Multiplanar CT image reconstructions were also generated. RADIATION DOSE REDUCTION: This exam was performed according to the departmental dose-optimization program which includes automated exposure control, adjustment of the mA and/or kV according to patient size and/or use of iterative  reconstruction technique. COMPARISON:  None Available. FINDINGS: Osseous: Depressed fracture through the anterior wall of the left maxillary sinus. Fracture also noted through the posterior wall of the left maxillary sinus. Orbits: Fracture through the floor of the left orbit and lateral wall of the left orbit. Globes are intact. No orbital emphysema. Sinuses: Blood seen within the left maxillary sinus. Soft tissues: Soft tissue swelling over the left face and orbit. Limited intracranial: See head CT report IMPRESSION: Fractures through the left lateral orbital wall and floor of the left orbit. Fractures through the anterior and posterior walls of the left maxillary sinus. Left fills the left maxillary sinus. Electronically Signed   By: Charlett Nose M.D.   On: 11/05/2021 20:25   CT HEAD WO CONTRAST ( )  Result Date: 11/05/2021 CLINICAL DATA:  Head trauma, intracranial venous injury suspected, fall EXAM: CT HEAD WITHOUT CONTRAST TECHNIQUE: Contiguous axial images were obtained from the base of the skull through the vertex without intravenous contrast. RADIATION DOSE REDUCTION: This exam was performed according to the departmental dose-optimization program which includes automated exposure control, adjustment of the mA and/or kV according to patient size and/or use of iterative reconstruction technique. COMPARISON:  None Available. FINDINGS: Brain: There is atrophy and chronic small vessel disease changes. No acute intracranial  abnormality. Specifically, no hemorrhage, hydrocephalus, mass lesion, acute infarction, or significant intracranial injury. Vascular: No hyperdense vessel or unexpected calcification. Skull: No acute calvarial abnormality. Sinuses/Orbits: Fractures through the anterior wall of the left maxillary sinus and floor of the left orbit. Left lateral orbital wall fracture. Fracture through the posterior wall of the left maxillary sinus. Blood seen within the left maxillary sinus. Other: None  IMPRESSION: No acute intracranial abnormality. Atrophy, chronic small vessel disease. Fractures through the anterior and posterior wall of the maxillary sinus. Fractures through the lateral wall and floor the left orbit. Electronically Signed   By: Charlett Nose M.D.   On: 11/05/2021 20:23    Assessment/Plan  1. Major neurocognitive disorder (HCC) Pt with baseline confusion  No  agitation as per nursing staff Cont with supportive care  Increase physical activity and cognitively engaging activities   2. Weight loss Diet liberalization  Will decrease metformin to 500mg  daily from bid Cont with rameron   3. Essential hypertension At goal 130/ 60 Cont with amlodipine, losartan, metoprolol  4. Diabetes due to underlying condition w diabetic nephropathy (HCC) Lab Results  Component Value Date   HGBA1C 6.9 12/24/2022   HGBA1C 7.2 11/26/2021   HGBA1C 8.3 10/24/2021   Will decrease metformin to 500mg  daily from bid Bg at goal  Will consider stopping at next visit   5. Hypercalcemia  Will check ionized calcium levels   6. CKD stage 3 due to type 2 diabetes mellitus (HCC)    Latest Ref Rng & Units 12/24/2022   12:00 AM 01/30/2022   12:00 AM 11/15/2021   12:00 AM  BMP  BUN 4 - 21 25     27     25       Creatinine 0.5 - 1.1 1.2     1.2     1.1      Sodium 137 - 147 139     139     136      Potassium 3.5 - 5.1 mEq/L 4.5     5.1     4.4      Chloride 99 - 108 104     111     104      CO2 13 - 22 26     22     19       Calcium 8.7 - 10.7 10.9     10.3     10.3         This result is from an external source.   Avoid nephrotoxic meds Increase oral hydration  7. Chronic diastolic congestive heart failure (HCC) Lungs clear  Cont with lasix , potassium     Family/ staff Communication:  care plan discussed with the nursing staff  Labs/tests ordered: ionized calcium levels  I spent greater than  40 minutes for the care of this patient in face to face time, chart review, clinical  documentation, patient education.

## 2023-06-22 ENCOUNTER — Encounter: Payer: Self-pay | Admitting: Nurse Practitioner

## 2023-06-22 ENCOUNTER — Non-Acute Institutional Stay: Payer: Medicare Other | Admitting: Nurse Practitioner

## 2023-06-22 DIAGNOSIS — R413 Other amnesia: Secondary | ICD-10-CM

## 2023-06-22 DIAGNOSIS — Z0000 Encounter for general adult medical examination without abnormal findings: Secondary | ICD-10-CM | POA: Diagnosis not present

## 2023-06-22 NOTE — Progress Notes (Signed)
Subjective:   [name] is a 89 y.o. female who presents for Medicare Annual (Subsequent) preventive examination.  Visit Complete: In person  Patient Medicare AWV questionnaire was completed by the patient on 06/22/23; I have confirmed that all information answered by patient is correct and no changes since this date.  Cardiac Risk Factors include: advanced age (>54men, >49 women);diabetes mellitus;dyslipidemia;hypertension     Objective:    Today's Vitals   06/22/23 1136  BP: (!) 146/50  Pulse: 72  Resp: 16  Temp: 97.7 F (36.5 C)  SpO2: 98%  Weight: 105 lb 9.6 oz (47.9 kg)   Body mass index is 19.31 kg/m.     04/17/2023    2:49 PM 04/13/2023   11:40 AM 01/06/2023   11:26 AM 06/17/2022    9:10 AM 06/16/2022   11:13 AM 01/27/2022    3:33 PM 01/15/2022    2:19 PM  Advanced Directives  Does Patient Have a Medical Advance Directive? Yes Yes Yes Yes Yes Yes Yes  Type of Estate agent of [place];Living will;Out of facility DNR (pink MOST or yellow form) Healthcare Power of [place];Living will;Out of facility DNR (pink MOST or yellow form) Healthcare Power of [place];Out of facility DNR (pink MOST or yellow form);Living will Healthcare Power of [place];Living will;Out of facility DNR (pink MOST or yellow form) Healthcare Power of [place];Living will;Out of facility DNR (pink MOST or yellow form) Healthcare Power of [place];Out of facility DNR (pink MOST or yellow form);Living will Out of facility DNR (pink MOST or yellow form);Healthcare Power of [place];Living will  Does patient want to make changes to medical advance directive? No - Patient declined No - Patient declined No - Patient declined  No - Patient declined No - Patient declined No - Patient declined  Copy of Healthcare Power of Attorney in Chart? Yes - validated most recent copy scanned in chart (See row information) Yes - validated most recent copy scanned in chart (See row information) Yes  - validated most recent copy scanned in chart (See row information) Yes - validated most recent copy scanned in chart (See row information) Yes - validated most recent copy scanned in chart (See row information) Yes - validated most recent copy scanned in chart (See row information) Yes - validated most recent copy scanned in chart (See row information)  Pre-existing out of facility DNR order (yellow form or pink MOST form)   Yellow form placed in chart (order not valid for inpatient use) Yellow form placed in chart (order not valid for inpatient use) Yellow form placed in chart (order not valid for inpatient use) Yellow form placed in chart (order not valid for inpatient use) Yellow form placed in chart (order not valid for inpatient use)    Current Medications (verified) Outpatient Encounter Medications as of 06/22/2023  Medication Sig   acetaminophen (TYLENOL) 325 MG tablet Take 650 mg by mouth every 6 (six) hours as needed for mild pain. Three times a day   amLODipine (NORVASC) 5 MG tablet Take 2.5 mg by mouth daily.   aspirin EC 81 MG EC tablet Take 1 tablet (81 mg total) by mouth daily.   atorvastatin (LIPITOR) 10 MG tablet Take 10 mg by mouth daily.   betamethasone dipropionate (DIPROLENE) 0.05 % ointment Apply topically. Apply BID PRN to active rash on extremities . 1 app immediately after shower. Twice A Day - PRN   diclofenac Sodium (VOLTAREN) 1 % GEL Apply 2 g topically 2 (two) times daily as  needed (knee pain). Apply to both knees   furosemide (LASIX) 20 MG tablet Take 20 mg by mouth. Once A Day on Mon, Wed, Fri   losartan (COZAAR) 50 MG tablet Take 1 tablet (50 mg total) by mouth daily.   metFORMIN (GLUCOPHAGE-XR) 500 MG 24 hr tablet Take 500 mg by mouth 2 (two) times daily. Take 1/2 tablet   metoprolol tartrate (LOPRESSOR) 25 MG tablet Take 12.5 mg by mouth 2 (two) times daily. Hold Metoprolol for SBP < 110 and DSP <50   mirtazapine (REMERON) 7.5 MG tablet Take 7.5 mg by mouth at  bedtime. 1/2 tablet   nitroGLYCERIN (NITROSTAT) 0.4 MG SL tablet Place 0.4 mg under the tongue every 5 (five) minutes as needed for chest pain. PLACE 0.4 MG UNDER TONGUE EVERY 5 MINUTES X3 FOR CHEST PAIN, IF NO RELIEF, CALL MD OR SEND TO ED FOR FURTHER EVALUATION.   potassium chloride (MICRO-K) 10 MEQ CR capsule Take 10 mEq by mouth. Once A Day on Mon, Wed, Fri   No facility-administered encounter medications on file as of 06/22/2023.    Allergies (verified) Bactrim, Betadine [povidone iodine], Metformin and related, Nutmeg oil (myristica oil), Prednisone, and Sulfa antibiotics   History: Past Medical History:  Diagnosis Date   Contact dermatitis and other eczema, due to unspecified cause    Cortical senile cataract    GERD (gastroesophageal reflux disease) 02/17/2013   Hyperparathyroidism, unspecified (HCC) 03/26/2010   Irritable bowel syndrome    Keratoconjunctivitis sicca, not specified as Sjogren's    Loss of weight    Macular degeneration (senile) of retina, unspecified    Memory loss    Other and unspecified hyperlipidemia    Other malaise and fatigue 06/17/2010   Pain in joint, site unspecified    Pathologic fracture of vertebrae    Reflux esophagitis    Scoliosis (and kyphoscoliosis), idiopathic    Senile osteoporosis    Type II or unspecified type diabetes mellitus without mention of complication, uncontrolled    Unspecified essential hypertension    Unspecified hereditary and idiopathic peripheral neuropathy    Unspecified vitamin D deficiency    Past Surgical History:  Procedure Laterality Date   APPENDECTOMY     BREAST LUMPECTOMY  02/05/2010   Dr. Elijah Birk Cornett   CATARACT EXTRACTION W/ INTRAOCULAR LENS  IMPLANT, BILATERAL  04/2013   Dr. Hazle Quant   CHOLECYSTECTOMY  2004   Dr. Abbey Chatters   COLONOSCOPY  2001   Dr. Loreta Ave   fibroidectomy  1950   HEMORRHOID SURGERY  1978   KYPHOPLASTY  2010   T10 & T12 Dr. Maeola Harman   MEDIAL PARTIAL KNEE REPLACEMENT Left 2004   Dr.  Sherlean Foot   SPINE SURGERY     vertebroplasty T10, T12   SPINE SURGERY  11/02/2001    C4-5 & C5-6 Titanium Plate Placed Dr. Channing Mutters   THYROIDECTOMY, PARTIAL Left 2007   TOTAL KNEE ARTHROPLASTY Right 2009   Dr. Charlann Boxer    Family History  Problem Relation Age of Onset   Diabetes Mother    Heart disease Mother        CHF   Stroke Father    Heart disease Sister        MI   Colon cancer Neg Hx    Stomach cancer Neg Hx    Esophageal cancer Neg Hx    Rectal cancer Neg Hx    Liver cancer Neg Hx    Social History   Socioeconomic History   Marital status: Widowed  Spouse name: Not on file   Number of children: 0   Years of education: Not on file   Highest education level: Not on file  Occupational History   Occupation: retired Insurance underwriter: RETIRED  Tobacco Use   Smoking status: Never   Smokeless tobacco: Never  Vaping Use   Vaping status: Never Used  Substance and Sexual Activity   Alcohol use: No   Drug use: No   Sexual activity: Never    Partners: Male    Birth control/protection: Post-menopausal  Other Topics Concern   Not on file  Social History Narrative   Lives at [facility] [place] since 02/06/2009   Widowed    No childred   Living Will   Social Drivers of Health   Financial Resource Strain: Low Risk  (11/24/2017)   Overall Financial Resource Strain (CARDIA)    Difficulty of Paying Living Expenses: Not hard at all  Food Insecurity: No Food Insecurity (11/24/2017)   Hunger Vital Sign    Worried About Running Out of Food in the Last Year: Never true    Ran Out of Food in the Last Year: Never true  Transportation Needs: No Transportation Needs (11/24/2017)   PRAPARE - Administrator, Civil Service (Medical): No    Lack of Transportation (Non-Medical): No  Physical Activity: Insufficiently Active (11/24/2017)   Exercise Vital Sign    Days of Exercise per Week: 7 days    Minutes of Exercise per Session: 20 min  Stress: No Stress  Concern Present (11/24/2017)   [employer] of Occupational Health - Occupational Stress Questionnaire    Feeling of Stress : Only a little  Social Connections: Moderately Isolated (11/24/2017)   Social Connection and Isolation Panel [NHANES]    Frequency of Communication with Friends and Family: More than three times a week    Frequency of Social Gatherings with Friends and Family: More than three times a week    Attends Religious Services: Never    Database administrator or Organizations: No    Attends Banker Meetings: Never    Marital Status: Widowed    Tobacco Counseling Counseling given: Not Answered   Clinical Intake:  Pre-visit preparation completed: Yes  Pain : No/denies pain     BMI - recorded: 19.31 Nutritional Status: BMI of 19-24  Normal Nutritional Risks: Unintentional weight loss, None Diabetes: Yes CBG done?: No Did pt. bring in CBG monitor from home?: No  How often do you need to have someone help you when you read instructions, pamphlets, or other written materials from your doctor or pharmacy?: 3 - Sometimes What is the last grade level you completed in school?: college  Interpreter Needed?: No  Information entered by :: Heather Mckendree Nedra Hai NP   Activities of Daily Living    06/22/2023    2:14 PM  In your present state of health, do you have any difficulty performing the following activities:  Hearing? 0  Vision? 0  Difficulty concentrating or making decisions? 1  Walking or climbing stairs? 1  Dressing or bathing? 1  Doing errands, shopping? 1  Preparing Food and eating ? N  Using the Toilet? N  In the past six months, have you accidently leaked urine? Y  Do you have problems with loss of bowel control? N  Managing your Medications? Y  Managing your Finances? Y  Housekeeping or managing your Housekeeping? Y    Patient Care Team: Lutricia Widjaja,  Neilani Duffee X, NP as PCP - General (Internal Medicine) [place] Riki Gehring X, NP as  Nurse Practitioner (Nurse Practitioner) Dorisann Frames, MD as Consulting Physician (Endocrinology) Nelson Chimes, MD as Consulting Physician (Ophthalmology) Dannielle Huh, MD as Consulting Physician (Orthopedic Surgery) Sheran Luz, MD as Consulting Physician (Physical Medicine and Rehabilitation) Maeola Harman, MD as Consulting Physician (Neurosurgery) Charna Elizabeth, MD as Consulting Physician (Gastroenterology) [place], Peter M, MD as Consulting Physician (Cardiology) Jerene Bears, MD as Consulting Physician (Gynecology)  Indicate any recent Medical Services you may have received from other than [facility] providers in the past year (date may be approximate).     Assessment:   This is a routine wellness examination for [name].  Hearing/Vision screen No results found.   Goals Addressed             This Visit's Progress    Maintain Mobility and Function       Evidence-based guidance:  Acknowledge and validate impact of pain, loss of strength and potential disfigurement (hand osteoarthritis) on mental health and daily life, such as social isolation, anxiety, depression, impaired sexual relationship and   injury from falls.  Anticipate referral to physical or occupational therapy for assessment, therapeutic exercise and recommendation for adaptive equipment or assistive devices; encourage participation.  Assess impact on ability to perform activities of daily living, as well as engage in sports and leisure events or requirements of work or school.  Provide anticipatory guidance and reassurance about the benefit of exercise to maintain function; acknowledge and normalize fear that exercise may worsen symptoms.  Encourage regular exercise, at least 10 minutes at a time for 45 minutes per week; consider yoga, water exercise and proprioceptive exercises; encourage use of wearable activity tracker to increase motivation and adherence.  Encourage maintenance or resumption of daily activities,  including employment, as pain allows and with minimal exposure to trauma.  Assist patient to advocate for adaptations to the work environment.  Consider level of pain and function, gender, age, lifestyle, patient preference, quality of life, readiness and ?ocapacity to benefit? when recommending patients for orthopaedic surgery consultation.  Explore strategies, such as changes to medication regimen or activity that enables patient to anticipate and manage flare-ups that increase deconditioning and disability.  Explore patient preferences; encourage exposure to a broader range of activities that have been avoided for fear of experiencing pain.  Identify barriers to participation in therapy or exercise, such as pain with activity, anticipated or imagined pain.  Monitor postoperative joint replacement or any preexisting joint replacement for ongoing pain and loss of function; provide social support and encouragement throughout recovery.   Notes:        Depression Screen    06/22/2023    4:08 PM 06/22/2023    2:15 PM 11/24/2017   10:57 AM 07/09/2017    2:23 PM 11/01/2015    3:49 PM 06/21/2015    2:04 PM 12/21/2014    2:07 PM  PHQ 2/9 Scores  PHQ - 2 Score 0 0 0 0 0 0 0    Fall Risk    06/17/2022    9:10 AM 11/24/2017   10:57 AM 07/09/2017    2:23 PM 11/01/2015    3:49 PM 07/26/2015    3:14 PM  Fall Risk   Falls in the past year? 0 No No No No  Number falls in past yr: 0      Injury with Fall? 0      Risk for fall due to : No  Fall Risks      Follow up Falls evaluation completed        MEDICARE RISK AT HOME: Medicare Risk at Home Any stairs in or around the home?: Yes If so, are there any without handrails?: No Home free of loose throw rugs in walkways, pet beds, electrical cords, etc?: Yes Adequate lighting in your home to reduce risk of falls?: Yes Life alert?: No Use of a cane, walker or w/c?: Yes Grab bars in the bathroom?: Yes Shower chair or bench in shower?: Yes Elevated  toilet seat or a handicapped toilet?: Yes  TIMED UP AND GO:  Was the test performed?  Yes  Length of time to ambulate 10 feet: 12 sec Gait slow and steady with assistive device    Cognitive Function:    06/18/2022   12:23 PM 11/09/2017    8:44 AM 05/01/2016    2:37 PM 06/08/2014    3:45 PM 02/17/2013    2:52 PM  MMSE - Mini Mental State Exam  Orientation to time 3 3 5 5 4   Orientation to Place 5 4 5 5 5   Registration 3 2 3 3 3   Attention/ Calculation 4 5 5 5 5   Recall 0 2 2 3 3   Language- name 2 objects 2 2 2 2 2   Language- repeat 1 1 1 1 1   Language- follow 3 step command 3 3 3 3 3   Language- read & follow direction 1 1 1 1 1   Write a sentence 1 1 1 1 1   Copy design 0 0 1 1 1   Total score 23 24 29 30 29         Immunizations Immunization History  Administered Date(s) Administered   Fluad Quad(high Dose 65+) 04/01/2023   Influenza Whole 03/30/2012, 04/13/2013, 04/04/2018   Influenza, High Dose Seasonal PF 04/02/2019   Influenza-Unspecified 04/17/2014, 03/29/2015, 04/18/2021, 04/21/2022   Moderna SARS-COV2 Booster Vaccination 05/08/2020, 04/15/2023   Moderna Sars-Covid-2 Vaccination 07/02/2019, 07/30/2019, 11/27/2020   PFIZER Comirnaty(Gray Top)Covid-19 Tri-Sucrose Vaccine 03/19/2021   Pfizer Covid-19 Vaccine Bivalent Booster 45yrs & up 05/01/2022   Pneumococcal Conjugate-13 12/08/2017   Pneumococcal Polysaccharide-23 06/30/2001   Td 06/30/2001, 12/08/2017   Unspecified SARS-COV-2 Vaccination 05/01/2022    TDAP status: Up to date  Flu Vaccine status: Up to date  Pneumococcal vaccine status: Up to date  Covid-19 vaccine status: Completed vaccines  Qualifies for Shingles Vaccine? Yes   Zostavax completed Yes   Shingrix Completed?: Yes  Screening Tests Health Maintenance  Topic Date Due   OPHTHALMOLOGY EXAM  08/29/2022   FOOT EXAM  10/23/2022   COVID-19 Vaccine (8 - 2024-25 season) 06/10/2023   HEMOGLOBIN A1C  06/25/2023   Medicare Annual Wellness (AWV)   06/21/2024   DTaP/Tdap/Td (3 - Tdap) 12/09/2027   Pneumonia Vaccine 5+ Years old  Completed   INFLUENZA VACCINE  Completed   DEXA SCAN  Completed   HPV VACCINES  Aged Out   Zoster Vaccines- Shingrix  Discontinued    Health Maintenance  Health Maintenance Due  Topic Date Due   OPHTHALMOLOGY EXAM  08/29/2022   FOOT EXAM  10/23/2022   COVID-19 Vaccine (8 - 2024-25 season) 06/10/2023   HEMOGLOBIN A1C  06/25/2023    Colorectal cancer screening: No longer required.   Mammogram status: No longer required due to aged out.  Bone Density status: Completed 01/23/2010. Results reflect: Bone density results: OSTEOPOROSIS. Repeat every 2 years.  Lung Cancer Screening: (Low Dose CT Chest recommended if Age 41-80 years, 20 pack-year  currently smoking OR have quit w/in 15years.) does not qualify.  Lung Cancer Screening Referral: NA  Additional Screening:  Hepatitis C Screening: does not qualify;   Vision Screening: Recommended annual ophthalmology exams for early detection of glaucoma and other disorders of the eye. Is the patient up to date with their annual eye exam?  No  Who is the provider or what is the name of the office in which the patient attends annual eye exams? [facility] will provide If pt is not established with a provider, would they like to be referred to a provider to establish care? No .   Dental Screening: Recommended annual dental exams for proper oral hygiene  Diabetic Foot Exam: 06/22/23  Community Resource Referral / Chronic Care Management: CRR required this visit?  No   CCM required this visit?  No     Plan:     I have personally reviewed and noted the following in the patient's chart:   Medical and social history Use of alcohol, tobacco or illicit drugs  Current medications and supplements including opioid prescriptions. Patient is not currently taking opioid prescriptions. Functional ability and status Nutritional status Physical activity Advanced  directives List of other physicians Hospitalizations, surgeries, and ER visits in previous 12 months Vitals Screenings to include cognitive, depression, and falls Referrals and appointments  In addition, I have reviewed and discussed with patient certain preventive protocols, quality metrics, and best practice recommendations. A written personalized care plan for preventive services as well as general preventive health recommendations were provided to patient.     Maria Armendarez X Montario Zilka, NP   06/29/2023   After Visit Summary: (In Person-Declined) Patient declined AVS at this time.

## 2023-07-02 ENCOUNTER — Encounter: Payer: Self-pay | Admitting: Nurse Practitioner

## 2023-07-02 ENCOUNTER — Non-Acute Institutional Stay: Payer: Medicare Other | Admitting: Nurse Practitioner

## 2023-07-02 DIAGNOSIS — N183 Chronic kidney disease, stage 3 unspecified: Secondary | ICD-10-CM

## 2023-07-02 DIAGNOSIS — R413 Other amnesia: Secondary | ICD-10-CM | POA: Diagnosis not present

## 2023-07-02 DIAGNOSIS — R634 Abnormal weight loss: Secondary | ICD-10-CM

## 2023-07-02 DIAGNOSIS — I10 Essential (primary) hypertension: Secondary | ICD-10-CM

## 2023-07-02 DIAGNOSIS — I5032 Chronic diastolic (congestive) heart failure: Secondary | ICD-10-CM

## 2023-07-02 DIAGNOSIS — E1122 Type 2 diabetes mellitus with diabetic chronic kidney disease: Secondary | ICD-10-CM

## 2023-07-02 DIAGNOSIS — Z66 Do not resuscitate: Secondary | ICD-10-CM | POA: Diagnosis not present

## 2023-07-02 DIAGNOSIS — D649 Anemia, unspecified: Secondary | ICD-10-CM

## 2023-07-02 DIAGNOSIS — E0821 Diabetes mellitus due to underlying condition with diabetic nephropathy: Secondary | ICD-10-CM

## 2023-07-02 DIAGNOSIS — E785 Hyperlipidemia, unspecified: Secondary | ICD-10-CM

## 2023-07-02 NOTE — Assessment & Plan Note (Addendum)
 Blood pressure is runs low Dbo 45, will reduce Losartan 25/50mg  qd, continue  Metoprolol, Amlodipine.  Bp daily.

## 2023-07-02 NOTE — Assessment & Plan Note (Signed)
 Bun/creat 29/1.27 01/20/23

## 2023-07-02 NOTE — Assessment & Plan Note (Signed)
 at her baseline. Vit B 601 12/24/22, declined further eye exam and DEXA

## 2023-07-02 NOTE — Assessment & Plan Note (Signed)
Hgb 10.4 01/30/22 

## 2023-07-02 NOTE — Assessment & Plan Note (Signed)
 takes Metformin, Hgb a1c 7.2 11/26/21>>6.9 12/24/22

## 2023-07-02 NOTE — Assessment & Plan Note (Signed)
stable, on Mirtazapine 7.'5mg'$  qd 01/15/22, too soon to eval. TSH 0.65 01/30/22

## 2023-07-02 NOTE — Assessment & Plan Note (Signed)
Ca 11.7>>9.9(corrected to 10.7, PTH 35, s/p Zometa x1, PTHrP <2.0 11/11/20,  Hyperparathyroidism, workup with Dr. Talmage Nap, Ca 10.3 01/30/22<<10.9 12/24/22, off MVI with minerals 01/06/23

## 2023-07-02 NOTE — Assessment & Plan Note (Signed)
chronic edema BLE, 04/16/21 Echo EF 40-45%, diastolic dysfunction. on Furosemide 

## 2023-07-02 NOTE — Assessment & Plan Note (Signed)
takes Atorvastatin, LDL 58 12/24/22

## 2023-07-02 NOTE — Progress Notes (Signed)
 Location:  [facility] Room Number: 814-A Place of Service:  ALF [phone]) Provider:  Sarahanne Novakowski ,Jazzlyn Huizenga X,NP  Juliana Boling X, NP  Patient Care Team: Jemya Depierro X, NP as PCP - General (Internal Medicine) [place] Standre X, NP as Nurse Practitioner (Nurse Practitioner) Tommas Pears, MD as Consulting Physician (Endocrinology) Camillo Golas, MD as Consulting Physician (Ophthalmology) Rubie Kemps, MD as Consulting Physician (Orthopedic Surgery) Bonner Ade, MD as Consulting Physician (Physical Medicine and Rehabilitation) Unice Pac, MD as Consulting Physician (Neurosurgery) Kristie Lamprey, MD as Consulting Physician (Gastroenterology) Jordan, Peter M, MD as Consulting Physician (Cardiology) Cleotilde Ronal RAMAN, MD as Consulting Physician (Gynecology)  Extended Emergency Contact Information Primary Emergency Contact: Jama Mom [facility] [place] Mobile Phone: [phone] Relation: Niece Secondary Emergency Contact: Joan Arrant Mobile Phone: [phone] Relation: Other Preferred language: Isadora Interpreter needed? No Guardian: Jama Mom Mobile Phone: [phone] Relation: Legal Guardian Preferred language: English Interpreter needed? No  Code Status:  DNR Goals of care: Advanced Directive information    07/02/2023    3:14 PM  Advanced Directives  Does Patient Have a Medical Advance Directive? Yes  Type of Estate Agent of [place];Living will;Out of facility DNR (pink MOST or yellow form)  Does patient want to make changes to medical advance directive? No - Patient declined  Copy of Healthcare Power of Attorney in Chart? Yes - validated most recent copy scanned in chart (See row information)  Pre-existing out of facility DNR order (yellow form or pink MOST form) Yellow form placed in chart (order not valid for inpatient use)     Chief Complaint  Patient presents with   Medical Management of Chronic Issues     Routine visit and discuss eye exam,foot exam,Hemoglobin A1C,and covid vaccine.    HPI:  Pt is a 89 y.o. female seen today for medical management of chronic diseases.     Hypercalcemia, Ca 11.7>>9.9(corrected to 10.7, PTH 35, s/p Zometa  x1, PTHrP <2.0 11/11/20,  Hyperparathyroidism, workup with Dr. Tommas, Ca 10.3 01/30/22<<10.9 12/24/22, off MVI with minerals 01/06/23             T2DM, takes Metformin , Hgb a1c 7.2 11/26/21>>6.9 12/24/22             HTN, takes Losartan , Metoprolol , Amlodipine .              CKD stage 3 Bun/creat 29/1.27 01/20/23             CHF, chronic edema BLE, 04/16/21 Echo EF 40-45%, diastolic dysfunction. on Furosemide             Cognitive impairment, at her baseline. Vit B 601 12/24/22, declined further eye exam and DEXA             Weight loss, stable, on Mirtazapine 7.5mg  qd 01/15/22, too soon to eval. TSH 0.65 01/30/22             Hyperlipidemia, takes Atorvastatin , LDL 58 12/24/22             Anemia, Hgb 10.4 01/30/22     Past Medical History:  Diagnosis Date   Contact dermatitis and other eczema, due to unspecified cause    Cortical senile cataract    GERD (gastroesophageal reflux disease) 02/17/2013   Hyperparathyroidism, unspecified (HCC) 03/26/2010   Irritable bowel syndrome    Keratoconjunctivitis sicca, not specified as Sjogren's    Loss of weight    Macular degeneration (senile) of retina, unspecified    Memory loss  Other and unspecified hyperlipidemia    Other malaise and fatigue 06/17/2010   Pain in joint, site unspecified    Pathologic fracture of vertebrae    Reflux esophagitis    Scoliosis (and kyphoscoliosis), idiopathic    Senile osteoporosis    Type II or unspecified type diabetes mellitus without mention of complication, uncontrolled    Unspecified essential hypertension    Unspecified hereditary and idiopathic peripheral neuropathy    Unspecified vitamin D deficiency    Past Surgical History:  Procedure Laterality Date   APPENDECTOMY      BREAST LUMPECTOMY  02/05/2010   Dr. Charlena Cornett   CATARACT EXTRACTION W/ INTRAOCULAR LENS  IMPLANT, BILATERAL  04/2013   Dr. Camillo   CHOLECYSTECTOMY  2004   Dr. Lily   COLONOSCOPY  2001   Dr. Kristie   fibroidectomy  1950   HEMORRHOID SURGERY  1978   KYPHOPLASTY  2010   T10 & T12 Dr. Fairy Levels   MEDIAL PARTIAL KNEE REPLACEMENT Left 2004   Dr. Rubie   SPINE SURGERY     vertebroplasty T10, T12   SPINE SURGERY  11/02/2001    C4-5 & C5-6 Titanium Plate Placed Dr. Gaither   THYROIDECTOMY, PARTIAL Left 2007   TOTAL KNEE ARTHROPLASTY Right 2009   Dr. Ernie     Allergies  Allergen Reactions   Bactrim     unknown   Betadine [Povidone Iodine]     unknown   Metformin  And Related     Hurt stomach   Nutmeg Oil (Myristica Oil)     unknown   Prednisone     Sulfa Antibiotics     unknown    Outpatient Encounter Medications as of 07/02/2023  Medication Sig   acetaminophen  (TYLENOL ) 325 MG tablet Take 650 mg by mouth every 6 (six) hours as needed for mild pain. Three times a day   amLODipine  (NORVASC ) 5 MG tablet Take 2.5 mg by mouth daily.   aspirin  EC 81 MG EC tablet Take 1 tablet (81 mg total) by mouth daily.   atorvastatin  (LIPITOR) 10 MG tablet Take 10 mg by mouth daily.   betamethasone  dipropionate (DIPROLENE ) 0.05 % ointment Apply topically. Apply BID PRN to active rash on extremities . 1 app immediately after shower. Twice A Day - PRN   diclofenac Sodium (VOLTAREN) 1 % GEL Apply 2 g topically 2 (two) times daily as needed (knee pain). Apply to both knees   furosemide (LASIX) 20 MG tablet Take 20 mg by mouth. Once A Day on Mon, Wed, Fri   losartan  (COZAAR ) 50 MG tablet Take 1 tablet (50 mg total) by mouth daily.   metFORMIN  (GLUCOPHAGE -XR) 500 MG 24 hr tablet Take 500 mg by mouth 2 (two) times daily. Take 1/2 tablet   metoprolol  tartrate (LOPRESSOR ) 25 MG tablet Take 12.5 mg by mouth 2 (two) times daily. Hold Metoprolol  for SBP < 110 and DSP <50   mirtazapine (REMERON) 7.5 MG tablet  Take 7.5 mg by mouth at bedtime. 1/2 tablet   nitroGLYCERIN  (NITROSTAT ) 0.4 MG SL tablet Place 0.4 mg under the tongue every 5 (five) minutes as needed for chest pain. PLACE 0.4 MG UNDER TONGUE EVERY 5 MINUTES X3 FOR CHEST PAIN, IF NO RELIEF, CALL MD OR SEND TO ED FOR FURTHER EVALUATION.   potassium chloride (MICRO-K) 10 MEQ CR capsule Take 10 mEq by mouth. Once A Day on Mon, Wed, Fri   No facility-administered encounter medications on file as of 07/02/2023.    Review of Systems  Constitutional:  Negative for fatigue, fever and unexpected weight change.  HENT:  Positive for hearing loss. Negative for congestion and trouble swallowing.   Eyes:  Negative for visual disturbance.  Respiratory:  Negative for cough and shortness of breath.   Cardiovascular:  Positive for leg swelling. Negative for chest pain and palpitations.  Gastrointestinal: Negative.  Negative for abdominal pain, constipation and diarrhea.       Urge incontinent of bowel.   Genitourinary:  Negative for dysuria and urgency.  Musculoskeletal:  Positive for arthralgias and gait problem.       Left knee pain when walking too much.   Skin:  Negative for color change.  Neurological:  Negative for speech difficulty, weakness and light-headedness.  Psychiatric/Behavioral:  Negative for confusion and sleep disturbance. The patient is not nervous/anxious.     Immunization History  Administered Date(s) Administered   Fluad Quad(high Dose 65+) 04/01/2023   Influenza Whole 03/30/2012, 04/13/2013, 04/04/2018   Influenza, High Dose Seasonal PF 04/02/2019   Influenza-Unspecified 04/17/2014, 03/29/2015, 04/18/2021, 04/21/2022   Moderna SARS-COV2 Booster Vaccination 05/08/2020, 04/15/2023   Moderna Sars-Covid-2 Vaccination 07/02/2019, 07/30/2019, 11/27/2020   PFIZER Comirnaty(Gray Top)Covid-19 Tri-Sucrose Vaccine 03/19/2021   Pfizer Covid-19 Vaccine Bivalent Booster 3yrs & up 05/01/2022   Pneumococcal Conjugate-13 12/08/2017    Pneumococcal Polysaccharide-23 06/30/2001   Td 06/30/2001, 12/08/2017   Unspecified SARS-COV-2 Vaccination 05/01/2022   Pertinent  Health Maintenance Due  Topic Date Due   OPHTHALMOLOGY EXAM  08/29/2022   FOOT EXAM  10/23/2022   HEMOGLOBIN A1C  06/25/2023   INFLUENZA VACCINE  Completed   DEXA SCAN  Completed      11/12/2020    7:45 AM 11/13/2020    2:00 AM 11/13/2020   10:00 AM 11/05/2021    7:37 PM 06/17/2022    9:10 AM  Fall Risk  Falls in the past year?     0  Was there an injury with Fall?     0  Fall Risk Category Calculator     0  Fall Risk Category (Retired)     Low  (RETIRED) Patient Fall Risk Level Moderate fall risk Moderate fall risk Moderate fall risk High fall risk Low fall risk  Patient at Risk for Falls Due to     No Fall Risks  Fall risk Follow up     Falls evaluation completed   Functional Status Survey:    Vitals:   07/02/23 1517 07/03/23 1145  BP: (!) 126/50 (!) 139/45  Pulse: 75   Resp: 18   Temp: (!) 97.5 F (36.4 C)   SpO2: 98%   Weight: 103 lb 9.6 oz (47 kg)   Height: 5' 2 (1.575 m)    Body mass index is 18.95 kg/m. Physical Exam Vitals and nursing note reviewed.  Constitutional:      Appearance: Normal appearance.  HENT:     Head: Normocephalic and atraumatic.     Mouth/Throat:     Mouth: Mucous membranes are moist.  Eyes:     Extraocular Movements: Extraocular movements intact.     Conjunctiva/sclera: Conjunctivae normal.     Pupils: Pupils are equal, round, and reactive to light.  Cardiovascular:     Rate and Rhythm: Normal rate and regular rhythm.     Heart sounds: No murmur heard.    Comments: DP pulses present R+L Pulmonary:     Effort: Pulmonary effort is normal.     Breath sounds: No rales.  Abdominal:     General: Bowel sounds are  normal. There is no distension.     Palpations: Abdomen is soft.     Tenderness: There is no abdominal tenderness. There is no right CVA tenderness, left CVA tenderness, guarding or rebound.   Genitourinary:    Comments: External hemorrhoids, no injury or bleeding.  Musculoskeletal:     Cervical back: Normal range of motion and neck supple.     Right lower leg: Edema present.     Left lower leg: Edema present.     Comments: Kyphoscoliosis. Minimal edema BLE  Skin:    General: Skin is warm and dry.     Comments: Yellow, thick, long toenails.   Neurological:     General: No focal deficit present.     Mental Status: She is alert. Mental status is at baseline.     Motor: No weakness.     Coordination: Coordination normal.     Gait: Gait abnormal.     Comments: Oriented to person, place.   Psychiatric:        Mood and Affect: Mood normal.        Behavior: Behavior normal.        Thought Content: Thought content normal.        Judgment: Judgment normal.     Labs reviewed: Recent Labs    12/24/22 0000  NA 139  K 4.5  CL 104  CO2 26*  BUN 25*  CREATININE 1.2*  CALCIUM  10.9*   Recent Labs    12/24/22 0000  AST 21  ALT 15  ALKPHOS 57  ALBUMIN 4.1   No results for input(s): WBC, NEUTROABS, HGB, HCT, MCV, PLT in the last 8760 hours. Lab Results  Component Value Date   TSH 0.65 01/30/2022   Lab Results  Component Value Date   HGBA1C 6.9 12/24/2022   Lab Results  Component Value Date   CHOL 133 12/24/2022   HDL 59 12/24/2022   LDLCALC 58 12/24/2022   TRIG 80 12/24/2022   CHOLHDL 2.4 06/27/2018    Significant Diagnostic Results in last 30 days:  No results found.  Assessment/Plan Memory loss at her baseline. Vit B 601 12/24/22, declined further eye exam and DEXA  Weight loss stable, on Mirtazapine 7.5mg  qd 01/15/22, too soon to eval. TSH 0.65 01/30/22  Hyperlipidemia takes Atorvastatin , LDL 58 12/24/22  Anemia Hgb 10.4 01/30/22  Diastolic CHF (HCC)  chronic edema BLE, 04/16/21 Echo EF 40-45%, diastolic dysfunction. on Furosemide  CKD stage 3 due to type 2 diabetes mellitus (HCC) Bun/creat 29/1.27 01/20/23  Hypercalcemia  Ca  11.7>>9.9(corrected to 10.7, PTH 35, s/p Zometa  x1, PTHrP <2.0 11/11/20,  Hyperparathyroidism, workup with Dr. Tommas, Ca 10.3 01/30/22<<10.9 12/24/22, off MVI with minerals 01/06/23  Diabetes due to underlying condition w diabetic nephropathy (HCC) takes Metformin , Hgb a1c 7.2 11/26/21>>6.9 12/24/22  Essential hypertension Blood pressure is runs low Dbo 45, will reduce Losartan  25/50mg  qd, continue  Metoprolol , Amlodipine .  Bp daily.      Family/ staff Communication: plan of care reviewed with the patient and charge nurse.   Labs/tests ordered:  none  Time spend 40 minutes.

## 2023-07-08 ENCOUNTER — Encounter: Payer: Self-pay | Admitting: Adult Health

## 2023-07-08 ENCOUNTER — Non-Acute Institutional Stay: Payer: Medicare Other | Admitting: Adult Health

## 2023-07-08 DIAGNOSIS — I10 Essential (primary) hypertension: Secondary | ICD-10-CM | POA: Diagnosis not present

## 2023-07-08 DIAGNOSIS — U071 COVID-19: Secondary | ICD-10-CM | POA: Diagnosis not present

## 2023-07-08 MED ORDER — AMLODIPINE BESYLATE 5 MG PO TABS: 5.0000 mg | ORAL_TABLET | Freq: Every day | ORAL | 3 refills | Status: DC

## 2023-07-08 MED ORDER — AMLODIPINE BESYLATE 5 MG PO TABS: 2.5000 mg | ORAL_TABLET | Freq: Every day | ORAL | Status: DC

## 2023-07-08 MED ORDER — AMLODIPINE BESYLATE 5 MG PO TABS: 2.5000 mg | ORAL_TABLET | Freq: Every day | ORAL | 3 refills | Status: AC

## 2023-07-08 MED ORDER — NIRMATRELVIR/RITONAVIR (PAXLOVID) TABLET (RENAL DOSING): 2.0000 | ORAL_TABLET | Freq: Two times a day (BID) | ORAL | 0 refills | Status: AC

## 2023-07-08 NOTE — Progress Notes (Signed)
 [facility] clinic  Provider:  Jereld Serum DNP  Code Status:  DNR  Goals of Care:     07/02/2023    3:14 PM  Advanced Directives  Does Patient Have a Medical Advance Directive? Yes  Type of Estate Agent of [place];Living will;Out of facility DNR (pink MOST or yellow form)  Does patient want to make changes to medical advance directive? No - Patient declined  Copy of Healthcare Power of Attorney in Chart? Yes - validated most recent copy scanned in chart (See row information)  Pre-existing out of facility DNR order (yellow form or pink MOST form) Yellow form placed in chart (order not valid for inpatient use)     Chief Complaint  Patient presents with   Acute Visit    Fever and elevated  BP    HPI: Patient is a 89 y.o. female seen today for an acute visit for fever and elevated BP. She is a resident of [facility]. She has fever of 99.4. She stated that she has not gone out to walk today because she feels weak. She tested positive for COVID-19. She is currently taking Atorvastatin  for hyperlipidemia and Remeron to increase appetite. She has occasional dry cough.  BP 170/71, denies headache. She takes Losartan , Amlodipine  and Metoprolol  tartrate for hypertension.  Past Medical History:  Diagnosis Date   Contact dermatitis and other eczema, due to unspecified cause    Cortical senile cataract    GERD (gastroesophageal reflux disease) 02/17/2013   Hyperparathyroidism, unspecified (HCC) 03/26/2010   Irritable bowel syndrome    Keratoconjunctivitis sicca, not specified as Sjogren's    Loss of weight    Macular degeneration (senile) of retina, unspecified    Memory loss    Other and unspecified hyperlipidemia    Other malaise and fatigue 06/17/2010   Pain in joint, site unspecified    Pathologic fracture of vertebrae    Reflux esophagitis    Scoliosis (and kyphoscoliosis), idiopathic    Senile osteoporosis    Type II or unspecified type  diabetes mellitus without mention of complication, uncontrolled    Unspecified essential hypertension    Unspecified hereditary and idiopathic peripheral neuropathy    Unspecified vitamin D deficiency     Past Surgical History:  Procedure Laterality Date   APPENDECTOMY     BREAST LUMPECTOMY  02/05/2010   Dr. Charlena Cornett   CATARACT EXTRACTION W/ INTRAOCULAR LENS  IMPLANT, BILATERAL  04/2013   Dr. Camillo   CHOLECYSTECTOMY  2004   Dr. Lily   COLONOSCOPY  2001   Dr. Kristie   fibroidectomy  1950   HEMORRHOID SURGERY  1978   KYPHOPLASTY  2010   T10 & T12 Dr. Fairy Levels   MEDIAL PARTIAL KNEE REPLACEMENT Left 2004   Dr. Rubie   SPINE SURGERY     vertebroplasty T10, T12   SPINE SURGERY  11/02/2001    C4-5 & C5-6 Titanium Plate Placed Dr. Gaither   THYROIDECTOMY, PARTIAL Left 2007   TOTAL KNEE ARTHROPLASTY Right 2009   Dr. Ernie     Allergies  Allergen Reactions   Bactrim     unknown   Betadine [Povidone Iodine]     unknown   Metformin  And Related     Hurt stomach   Nutmeg Oil (Myristica Oil)     unknown   Prednisone     Sulfa Antibiotics     unknown    Outpatient Encounter Medications as of 07/08/2023  Medication Sig   nirmatrelvir /ritonavir ,  renal dosing, (PAXLOVID ) 10 x 150 MG & 10 x 100MG  TABS Take 2 tablets by mouth 2 (two) times daily for 5 days. (Take nirmatrelvir  150 mg one tablet twice daily for 5 days and ritonavir  100 mg one tablet twice daily for 5 days) Patient GFR is 45, 04/14/23   [DISCONTINUED] amLODipine  (NORVASC ) 5 MG tablet Take 0.5 tablets (2.5 mg total) by mouth daily.   acetaminophen  (TYLENOL ) 325 MG tablet Take 650 mg by mouth every 6 (six) hours as needed for mild pain. Three times a day   amLODipine  (NORVASC ) 5 MG tablet Take 0.5 tablets (2.5 mg total) by mouth daily.   aspirin  EC 81 MG EC tablet Take 1 tablet (81 mg total) by mouth daily.   atorvastatin  (LIPITOR) 10 MG tablet Take 10 mg by mouth daily.   betamethasone  dipropionate (DIPROLENE ) 0.05 %  ointment Apply topically. Apply BID PRN to active rash on extremities . 1 app immediately after shower. Twice A Day - PRN   diclofenac Sodium (VOLTAREN) 1 % GEL Apply 2 g topically 2 (two) times daily as needed (knee pain). Apply to both knees   furosemide (LASIX) 20 MG tablet Take 20 mg by mouth. Once A Day on Mon, Wed, Fri   losartan  (COZAAR ) 50 MG tablet Take 1 tablet (50 mg total) by mouth daily.   metFORMIN  (GLUCOPHAGE -XR) 500 MG 24 hr tablet Take 500 mg by mouth 2 (two) times daily. Take 1/2 tablet   metoprolol  tartrate (LOPRESSOR ) 25 MG tablet Take 12.5 mg by mouth 2 (two) times daily. Hold Metoprolol  for SBP < 110 and DSP <50   mirtazapine (REMERON) 7.5 MG tablet Take 7.5 mg by mouth at bedtime. 1/2 tablet   nitroGLYCERIN  (NITROSTAT ) 0.4 MG SL tablet Place 0.4 mg under the tongue every 5 (five) minutes as needed for chest pain. PLACE 0.4 MG UNDER TONGUE EVERY 5 MINUTES X3 FOR CHEST PAIN, IF NO RELIEF, CALL MD OR SEND TO ED FOR FURTHER EVALUATION.   potassium chloride (MICRO-K) 10 MEQ CR capsule Take 10 mEq by mouth. Once A Day on Mon, Wed, Fri   [DISCONTINUED] amLODipine  (NORVASC ) 5 MG tablet Take 5 mg by mouth daily.   [DISCONTINUED] amLODipine  (NORVASC ) 5 MG tablet Take 1 tablet (5 mg total) by mouth daily.   No facility-administered encounter medications on file as of 07/08/2023.    Review of Systems:  Review of Systems  Constitutional:  Positive for appetite change and fever. Negative for chills and fatigue.  HENT:  Negative for congestion, hearing loss, rhinorrhea and sore throat.   Eyes: Negative.   Respiratory:  Positive for cough. Negative for shortness of breath and wheezing.   Cardiovascular:  Negative for chest pain, palpitations and leg swelling.  Gastrointestinal:  Negative for abdominal pain, constipation, diarrhea, nausea and vomiting.  Genitourinary:  Negative for dysuria.  Musculoskeletal:  Negative for arthralgias, back pain and myalgias.  Skin:  Negative for color  change, rash and wound.  Neurological:  Negative for dizziness, weakness and headaches.  Psychiatric/Behavioral:  Negative for behavioral problems. The patient is not nervous/anxious.     Health Maintenance  Topic Date Due   OPHTHALMOLOGY EXAM  08/29/2022   FOOT EXAM  10/23/2022   COVID-19 Vaccine (8 - 2024-25 season) 06/10/2023   HEMOGLOBIN A1C  06/25/2023   Medicare Annual Wellness (AWV)  06/21/2024   DTaP/Tdap/Td (3 - Tdap) 12/09/2027   Pneumonia Vaccine 35+ Years old  Completed   INFLUENZA VACCINE  Completed   DEXA SCAN  Completed  HPV VACCINES  Aged Out   Zoster Vaccines- Shingrix  Discontinued    Physical Exam: Vitals:   07/08/23 1645  BP: (!) 170/71  Pulse: 75  Resp: 18  Temp: (!) 97.5 F (36.4 C)  SpO2: 98%  Weight: 103 lb 9.6 oz (47 kg)  Height: 5' 2 (1.575 m)   Body mass index is 18.95 kg/m. Physical Exam Constitutional:      Appearance: Normal appearance.  HENT:     Head: Normocephalic and atraumatic.     Nose: Nose normal.     Mouth/Throat:     Mouth: Mucous membranes are moist.  Eyes:     Conjunctiva/sclera: Conjunctivae normal.  Cardiovascular:     Rate and Rhythm: Normal rate and regular rhythm.  Pulmonary:     Effort: Pulmonary effort is normal.     Breath sounds: Normal breath sounds.  Abdominal:     General: Bowel sounds are normal.     Palpations: Abdomen is soft.  Musculoskeletal:        General: Normal range of motion.     Cervical back: Normal range of motion.  Skin:    General: Skin is warm and dry.  Neurological:     Mental Status: She is alert.  Psychiatric:        Mood and Affect: Mood normal.        Behavior: Behavior normal.        Thought Content: Thought content normal.        Judgment: Judgment normal.     Labs reviewed: Basic Metabolic Panel: Recent Labs    12/24/22 0000  NA 139  K 4.5  CL 104  CO2 26*  BUN 25*  CREATININE 1.2*  CALCIUM  10.9*   Liver Function Tests: Recent Labs    12/24/22 0000  AST  21  ALT 15  ALKPHOS 57  ALBUMIN 4.1   No results for input(s): LIPASE, AMYLASE in the last 8760 hours. No results for input(s): AMMONIA in the last 8760 hours. CBC: No results for input(s): WBC, NEUTROABS, HGB, HCT, MCV, PLT in the last 8760 hours. Lipid Panel: Recent Labs    12/24/22 0000  CHOL 133  HDL 59  LDLCALC 58  TRIG 80   Lab Results  Component Value Date   HGBA1C 6.9 12/24/2022    Procedures since last visit: No results found.  Assessment/Plan  1. COVID-19 (Primary) - GFR 45, will start on renally dosed Paxlovid  -  hold Atorvastatin  and Remeron X 8 days - nirmatrelvir /ritonavir , renal dosing, (PAXLOVID ) 10 x 150 MG & 10 x 100MG  TABS; Take 2 tablets by mouth 2 (two) times daily for 5 days. (Take nirmatrelvir  150 mg one tablet twice daily for 5 days and ritonavir  100 mg one tablet twice daily for 5 days) Patient GFR is 45, 04/14/23  Dispense: 20 tablet; Refill: 0  2. Essential hypertension -  continue Amlodipine , Losartan  and Metoprolol  tartrate -  will continue current medications since Paxlovid  might increase the effect of Amlodipine  -  Monitor BP   Labs/tests ordered:   COVID-19 test  Next appt:  Visit date not found

## 2023-10-09 ENCOUNTER — Encounter: Payer: Self-pay | Admitting: Nurse Practitioner

## 2023-10-09 ENCOUNTER — Non-Acute Institutional Stay: Payer: Self-pay | Admitting: Nurse Practitioner

## 2023-10-09 DIAGNOSIS — E0821 Diabetes mellitus due to underlying condition with diabetic nephropathy: Secondary | ICD-10-CM

## 2023-10-09 DIAGNOSIS — E785 Hyperlipidemia, unspecified: Secondary | ICD-10-CM

## 2023-10-09 DIAGNOSIS — I10 Essential (primary) hypertension: Secondary | ICD-10-CM | POA: Diagnosis not present

## 2023-10-09 DIAGNOSIS — R5381 Other malaise: Secondary | ICD-10-CM

## 2023-10-09 DIAGNOSIS — D649 Anemia, unspecified: Secondary | ICD-10-CM

## 2023-10-09 DIAGNOSIS — R5383 Other fatigue: Secondary | ICD-10-CM

## 2023-10-09 DIAGNOSIS — R4189 Other symptoms and signs involving cognitive functions and awareness: Secondary | ICD-10-CM

## 2023-10-09 DIAGNOSIS — I5032 Chronic diastolic (congestive) heart failure: Secondary | ICD-10-CM

## 2023-10-09 DIAGNOSIS — F418 Other specified anxiety disorders: Secondary | ICD-10-CM

## 2023-10-09 NOTE — Progress Notes (Signed)
 Location:   [facility] Room Number: 814 Place of Service:  ALF (13) Provider: Abner Hoffman Geralyn Figiel NP  Johnda Billiot X, NP  Patient Care Team: Bonetta Mostek X, NP as PCP - General (Internal Medicine) [place] Christien Berthelot X, NP as Nurse Practitioner (Nurse Practitioner) Tasia Farr, MD as Consulting Physician (Endocrinology) Corie Diamond, MD as Consulting Physician (Ophthalmology) Christie Cox, MD as Consulting Physician (Orthopedic Surgery) Adelaide Adjutant, MD as Consulting Physician (Physical Medicine and Rehabilitation) Manya Sells, MD as Consulting Physician (Neurosurgery) Tami Falcon, MD as Consulting Physician (Gastroenterology) [place], Peter M, MD as Consulting Physician (Cardiology) Lillian Rein, MD as Consulting Physician (Gynecology)  Extended Emergency Contact Information Primary Emergency Contact: Earnest Goad [place] Mobile Phone: [phone] Relation: Niece Secondary Emergency Contact: Dustin Gimenez Mobile Phone: [phone] Relation: Other Preferred language: Andra Banco Interpreter needed? No Guardian: Earnest Goad Mobile Phone: [phone] Relation: Legal Guardian Preferred language: English Interpreter needed? No  Code Status: DNR Goals of care: Advanced Directive information    07/02/2023    3:14 PM  Advanced Directives  Does Patient Have a Medical Advance Directive? Yes  Type of Estate agent of [place];Living will;Out of facility DNR (pink MOST or yellow form)  Does patient want to make changes to medical advance directive? No - Patient declined  Copy of Healthcare Power of Attorney in Chart? Yes - validated most recent copy scanned in chart (See row information)  Pre-existing out of facility DNR order (yellow form or pink MOST form) Yellow form placed in chart (order not valid for inpatient use)     Chief Complaint  Patient presents with   Acute Visit    fatigue    HPI:  Pt is a 89 y.o.  female seen today for an acute visit for c/o fatigue, weakness in legs/feet, have to take a break on her way to dinning room yesterday, denied SOB, chest pain, palpitation, dizziness, or focal weakness. The patient is in her usual state of health today.   Hypercalcemia, Ca 11.7>>9.9(corrected to 10.7, PTH 35, s/p Zometa x1, PTHrP <2.0 11/11/20,  Hyperparathyroidism, workup with Dr. Ronelle Coffee, Ca 10.3 01/30/22<<10.9 12/24/22>>10.5 10/08/23, off MVI with minerals 01/06/23             T2DM, takes Metformin, Hgb a1c 7.2 11/26/21>>6.9 12/24/22             HTN, mild Sbp in 140s, takes Losartan, Metoprolol, Amlodipine.              CKD stage 3 Bun/creat 25/1.39 10/08/23             CHF, chronic edema BLE, 04/16/21 Echo EF 40-45%, diastolic dysfunction. on Furosemide             Cognitive impairment, at her baseline. Vit B 601 12/24/22, declined further eye exam and DEXA             Weight loss, stable, on Mirtazapine 7.5mg  every day, TSH 0.65 01/30/22             Hyperlipidemia, takes Atorvastatin, LDL 58 12/24/22             Anemia, Hgb 10.5 10/08/23                    Past Medical History:  Diagnosis Date   Contact dermatitis and other eczema, due to unspecified cause    Cortical senile cataract    GERD (gastroesophageal reflux disease) 02/17/2013   Hyperparathyroidism, unspecified (HCC)  03/26/2010   Irritable bowel syndrome    Keratoconjunctivitis sicca, not specified as Sjogren's    Loss of weight    Macular degeneration (senile) of retina, unspecified    Memory loss    Other and unspecified hyperlipidemia    Other malaise and fatigue 06/17/2010   Pain in joint, site unspecified    Pathologic fracture of vertebrae    Reflux esophagitis    Scoliosis (and kyphoscoliosis), idiopathic    Senile osteoporosis    Type II or unspecified type diabetes mellitus without mention of complication, uncontrolled    Unspecified essential hypertension    Unspecified hereditary and idiopathic peripheral neuropathy     Unspecified vitamin D deficiency    Past Surgical History:  Procedure Laterality Date   APPENDECTOMY     BREAST LUMPECTOMY  02/05/2010   Dr. Hinton Luis Cornett   CATARACT EXTRACTION W/ INTRAOCULAR LENS  IMPLANT, BILATERAL  04/2013   Dr. Danley Dusky   CHOLECYSTECTOMY  2004   Dr. Lynnell Sato   COLONOSCOPY  2001   Dr. Tova Fresh   fibroidectomy  1950   HEMORRHOID SURGERY  1978   KYPHOPLASTY  2010   T10 & T12 Dr. Manya Sells   MEDIAL PARTIAL KNEE REPLACEMENT Left 2004   Dr. Genevive Ket   SPINE SURGERY     vertebroplasty T10, T12   SPINE SURGERY  11/02/2001    C4-5 & C5-6 Titanium Plate Placed Dr. Joanette Moynahan   THYROIDECTOMY, PARTIAL Left 2007   TOTAL KNEE ARTHROPLASTY Right 2009   Dr. Bernard Brick     Allergies  Allergen Reactions   Bactrim     unknown   Betadine [Povidone Iodine]     unknown   Metformin And Related     Hurt stomach   Nutmeg Oil (Myristica Oil)     unknown   Prednisone    Sulfa Antibiotics     unknown    Allergies as of 10/09/2023       Reactions   Bactrim    unknown   Betadine [povidone Iodine]    unknown   Metformin And Related    Hurt stomach   Nutmeg Oil (myristica Oil)    unknown   Prednisone    Sulfa Antibiotics    unknown        Medication List        Accurate as of October 09, 2023 11:59 PM. If you have any questions, ask your nurse or doctor.          acetaminophen 325 MG tablet Commonly known as: TYLENOL Take 650 mg by mouth every 6 (six) hours as needed for mild pain. Three times a day   amLODipine 5 MG tablet Commonly known as: NORVASC Take 0.5 tablets (2.5 mg total) by mouth daily.   aspirin EC 81 MG tablet Take 1 tablet (81 mg total) by mouth daily.   atorvastatin 10 MG tablet Commonly known as: LIPITOR Take 10 mg by mouth daily.   betamethasone dipropionate 0.05 % ointment Commonly known as: DIPROLENE Apply topically. Apply BID PRN to active rash on extremities . 1 app immediately after shower. Twice A Day - PRN   diclofenac Sodium 1 %  Gel Commonly known as: VOLTAREN Apply 2 g topically 2 (two) times daily as needed (knee pain). Apply to both knees   furosemide 20 MG tablet Commonly known as: LASIX Take 20 mg by mouth. Once A Day on Mon, Wed, Fri   losartan 50 MG tablet Commonly known as: Cozaar Take 1 tablet (50 mg  total) by mouth daily.   metFORMIN 500 MG 24 hr tablet Commonly known as: GLUCOPHAGE-XR Take 500 mg by mouth 2 (two) times daily. Take 1/2 tablet   metoprolol tartrate 25 MG tablet Commonly known as: LOPRESSOR Take 12.5 mg by mouth 2 (two) times daily. Hold Metoprolol for SBP < 110 and DSP <50   mirtazapine 7.5 MG tablet Commonly known as: REMERON Take 7.5 mg by mouth at bedtime. 1/2 tablet   nitroGLYCERIN 0.4 MG SL tablet Commonly known as: NITROSTAT Place 0.4 mg under the tongue every 5 (five) minutes as needed for chest pain. PLACE 0.4 MG UNDER TONGUE EVERY 5 MINUTES X3 FOR CHEST PAIN, IF NO RELIEF, CALL MD OR SEND TO ED FOR FURTHER EVALUATION.   potassium chloride 10 MEQ CR capsule Commonly known as: MICRO-K Take 10 mEq by mouth. Once A Day on Mon, Wed, Fri        Review of Systems  Constitutional:  Negative for fatigue, fever and unexpected weight change.  HENT:  Positive for hearing loss. Negative for congestion and trouble swallowing.   Eyes:  Negative for visual disturbance.  Respiratory:  Negative for cough and shortness of breath.   Cardiovascular:  Positive for leg swelling. Negative for chest pain and palpitations.  Gastrointestinal: Negative.  Negative for abdominal pain, constipation and diarrhea.       Urge incontinent of bowel.   Genitourinary:  Negative for dysuria and urgency.  Musculoskeletal:  Positive for arthralgias and gait problem.       Left knee pain when walking too much.   Skin:  Negative for color change.  Neurological:  Negative for speech difficulty, weakness and light-headedness.  Psychiatric/Behavioral:  Negative for confusion and sleep disturbance. The  patient is not nervous/anxious.     Immunization History  Administered Date(s) Administered   Fluad Quad(high Dose 65+) 04/01/2023   Influenza Whole 03/30/2012, 04/13/2013, 04/04/2018   Influenza, High Dose Seasonal PF 04/02/2019   Influenza-Unspecified 04/17/2014, 03/29/2015, 04/18/2021, 04/21/2022   Moderna SARS-COV2 Booster Vaccination 05/08/2020, 04/15/2023   Moderna Sars-Covid-2 Vaccination 07/02/2019, 07/30/2019, 11/27/2020   PFIZER Comirnaty(Gray Top)Covid-19 Tri-Sucrose Vaccine 03/19/2021   Pfizer Covid-19 Vaccine Bivalent Booster 67yrs & up 05/01/2022   Pneumococcal Conjugate-13 12/08/2017   Pneumococcal Polysaccharide-23 06/30/2001   Td 06/30/2001, 12/08/2017   Unspecified SARS-COV-2 Vaccination 05/01/2022   Pertinent  Health Maintenance Due  Topic Date Due   OPHTHALMOLOGY EXAM  08/29/2022   FOOT EXAM  10/23/2022   HEMOGLOBIN A1C  06/25/2023   INFLUENZA VACCINE  01/29/2024   DEXA SCAN  Completed      11/12/2020    7:45 AM 11/13/2020    2:00 AM 11/13/2020   10:00 AM 11/05/2021    7:37 PM 06/17/2022    9:10 AM  Fall Risk  Falls in the past year?     0  Was there an injury with Fall?     0  Fall Risk Category Calculator     0  Fall Risk Category (Retired)     Low  (RETIRED) Patient Fall Risk Level Moderate fall risk Moderate fall risk Moderate fall risk High fall risk Low fall risk  Patient at Risk for Falls Due to     No Fall Risks  Fall risk Follow up     Falls evaluation completed   Functional Status Survey:    Vitals:   10/09/23 1620 10/12/23 1059  BP: (!) 142/78 (!) 148/60  Pulse: 88   Resp: 18   Temp: 98.9 F (37.2 C)  SpO2: 97%   Weight: 105 lb (47.6 kg)    Body mass index is 19.2 kg/m. Physical Exam Vitals and nursing note reviewed.  Constitutional:      Appearance: Normal appearance.  HENT:     Head: Normocephalic and atraumatic.     Mouth/Throat:     Mouth: Mucous membranes are moist.  Eyes:     Extraocular Movements: Extraocular  movements intact.     Conjunctiva/sclera: Conjunctivae normal.     Pupils: Pupils are equal, round, and reactive to light.  Cardiovascular:     Rate and Rhythm: Normal rate and regular rhythm.     Heart sounds: No murmur heard.    Comments: DP pulses present R+L Pulmonary:     Effort: Pulmonary effort is normal.     Breath sounds: No rales.  Abdominal:     General: Bowel sounds are normal. There is no distension.     Palpations: Abdomen is soft.     Tenderness: There is no abdominal tenderness. There is no right CVA tenderness, left CVA tenderness, guarding or rebound.  Genitourinary:    Comments: External hemorrhoids, no injury or bleeding.  Musculoskeletal:     Cervical back: Normal range of motion and neck supple.     Right lower leg: Edema present.     Left lower leg: Edema present.     Comments: Kyphoscoliosis. Minimal edema BLE  Skin:    General: Skin is warm and dry.     Comments: Yellow, thick, long toenails.   Neurological:     General: No focal deficit present.     Mental Status: She is alert. Mental status is at baseline.     Motor: No weakness.     Coordination: Coordination normal.     Gait: Gait abnormal.     Comments: Oriented to person, place.   Psychiatric:        Mood and Affect: Mood normal.        Behavior: Behavior normal.        Thought Content: Thought content normal.        Judgment: Judgment normal.     Labs reviewed: Recent Labs    12/24/22 0000  NA 139  K 4.5  CL 104  CO2 26*  BUN 25*  CREATININE 1.2*  CALCIUM 10.9*   Recent Labs    12/24/22 0000  AST 21  ALT 15  ALKPHOS 57  ALBUMIN 4.1   No results for input(s): "WBC", "NEUTROABS", "HGB", "HCT", "MCV", "PLT" in the last 8760 hours. Lab Results  Component Value Date   TSH 0.65 01/30/2022   Lab Results  Component Value Date   HGBA1C 6.9 12/24/2022   Lab Results  Component Value Date   CHOL 133 12/24/2022   HDL 59 12/24/2022   LDLCALC 58 12/24/2022   TRIG 80 12/24/2022    CHOLHDL 2.4 06/27/2018    Significant Diagnostic Results in last 30 days:  No results found.  Assessment/Plan: Malaise and fatigue c/o fatigue, weakness in legs/feet, have to take a break on her way to dinning room yesterday, denied SOB, chest pain, palpitation, dizziness, or focal weakness. The patient is in her usual state of health today.  10/08/23 Na 137, K 4.8, Bun 25, creat 1.39, wbc 9.4, Hgb 10.5, plt 290, neutrophil 57.5  Hypercalcemia Ca 11.7>>9.9(corrected to 10.7, PTH 35, s/p Zometa x1, PTHrP <2.0 11/11/20,  Hyperparathyroidism, workup with Dr. Ronelle Coffee, Ca 10.3 01/30/22<<10.9 12/24/22>>10.5 10/08/23, off MVI with minerals 01/06/23  Diabetes due to underlying  condition w diabetic nephropathy (HCC) takes Metformin, Hgb a1c 7.2 11/26/21>>6.9 12/24/22 Bun/creat 25/1.39 10/08/23  Essential hypertension mild Sbp in 140s, takes Losartan, Metoprolol, Amlodipine.   Diastolic CHF (HCC) Compensated,  chronic edema BLE, 04/16/21 Echo EF 40-45%, diastolic dysfunction. on Furosemide  Cognitive impairment  at her baseline. Vit B 601 12/24/22, declined further eye exam and DEXA  Depression with anxiety  stable, on Mirtazapine 7.5mg  every day, TSH 0.65 01/30/22  Hyperlipidemia  takes Atorvastatin, LDL 58 12/24/22    Family/ staff Communication: plan of care reviewed with the patient and charge nurse.   Labs/tests ordered:  CBC/diff, CMP/eGFR done 10/08/23

## 2023-10-12 NOTE — Assessment & Plan Note (Addendum)
 c/o fatigue, weakness in legs/feet, have to take a break on her way to dinning room yesterday, denied SOB, chest pain, palpitation, dizziness, or focal weakness. The patient is in her usual state of health today.  10/08/23 Na 137, K 4.8, Bun 25, creat 1.39, wbc 9.4, Hgb 10.5, plt 290, neutrophil 57.5

## 2023-10-12 NOTE — Assessment & Plan Note (Signed)
 Hgb 10.5 10/08/23

## 2023-10-12 NOTE — Assessment & Plan Note (Signed)
takes Atorvastatin, LDL 58 12/24/22

## 2023-10-12 NOTE — Assessment & Plan Note (Signed)
 Compensated,  chronic edema BLE, 04/16/21 Echo EF 40-45%, diastolic dysfunction. on Furosemide

## 2023-10-12 NOTE — Assessment & Plan Note (Signed)
 stable, on Mirtazapine 7.5mg  every day, TSH 0.65 01/30/22

## 2023-10-12 NOTE — Assessment & Plan Note (Signed)
 Ca 11.7>>9.9(corrected to 10.7, PTH 35, s/p Zometa x1, PTHrP <2.0 11/11/20,  Hyperparathyroidism, workup with Dr. Ronelle Coffee, Ca 10.3 01/30/22<<10.9 12/24/22>>10.5 10/08/23, off MVI with minerals 01/06/23

## 2023-10-12 NOTE — Assessment & Plan Note (Signed)
 mild Sbp in 140s, takes Losartan, Metoprolol, Amlodipine.

## 2023-10-12 NOTE — Assessment & Plan Note (Addendum)
 takes Metformin, Hgb a1c 7.2 11/26/21>>6.9 12/24/22 Bun/creat 25/1.39 10/08/23

## 2023-10-12 NOTE — Assessment & Plan Note (Signed)
 at her baseline. Vit B 601 12/24/22, declined further eye exam and DEXA

## 2023-10-22 ENCOUNTER — Non-Acute Institutional Stay: Payer: Self-pay | Admitting: Sports Medicine

## 2023-10-22 DIAGNOSIS — F0390 Unspecified dementia without behavioral disturbance: Secondary | ICD-10-CM

## 2023-10-22 DIAGNOSIS — I10 Essential (primary) hypertension: Secondary | ICD-10-CM

## 2023-10-22 DIAGNOSIS — E782 Mixed hyperlipidemia: Secondary | ICD-10-CM | POA: Diagnosis not present

## 2023-10-22 DIAGNOSIS — E876 Hypokalemia: Secondary | ICD-10-CM

## 2023-10-22 DIAGNOSIS — I5032 Chronic diastolic (congestive) heart failure: Secondary | ICD-10-CM | POA: Diagnosis not present

## 2023-10-22 DIAGNOSIS — N183 Chronic kidney disease, stage 3 unspecified: Secondary | ICD-10-CM

## 2023-10-22 DIAGNOSIS — E1122 Type 2 diabetes mellitus with diabetic chronic kidney disease: Secondary | ICD-10-CM

## 2023-10-22 DIAGNOSIS — E1142 Type 2 diabetes mellitus with diabetic polyneuropathy: Secondary | ICD-10-CM

## 2023-10-22 DIAGNOSIS — M159 Polyosteoarthritis, unspecified: Secondary | ICD-10-CM

## 2023-10-22 NOTE — Progress Notes (Unsigned)
 Provider:  Dr. Tye Gall Location:  [facility] [place] Place of Service:   Assisted living   PCP: Mast, Man X, NP Patient Care Team: Mast, Man X, NP as PCP - General (Internal Medicine) [place] Mast, Man X, NP as Nurse Practitioner (Nurse Practitioner) Tasia Farr, MD as Consulting Physician (Endocrinology) Corie Diamond, MD as Consulting Physician (Ophthalmology) Christie Cox, MD as Consulting Physician (Orthopedic Surgery) Adelaide Adjutant, MD as Consulting Physician (Physical Medicine and Rehabilitation) Manya Sells, MD as Consulting Physician (Neurosurgery) Tami Falcon, MD as Consulting Physician (Gastroenterology) [place], Peter M, MD as Consulting Physician (Cardiology) Lillian Rein, MD as Consulting Physician (Gynecology)  Extended Emergency Contact Information Primary Emergency Contact: Earnest Goad [facility] [place] Mobile Phone: [phone] Relation: Niece Secondary Emergency Contact: Dustin Gimenez Mobile Phone: [phone] Relation: Other Preferred language: Andra Banco Interpreter needed? No Guardian: Earnest Goad Mobile Phone: [phone] Relation: Legal Guardian Preferred language: English Interpreter needed? No  Goals of Care: Advanced Directive information    07/02/2023    3:14 PM  Advanced Directives  Does Patient Have a Medical Advance Directive? Yes  Type of Estate agent of [place];Living will;Out of facility DNR (pink MOST or yellow form)  Does patient want to make changes to medical advance directive? No - Patient declined  Copy of Healthcare Power of Attorney in Chart? Yes - validated most recent copy scanned in chart (See row information)  Pre-existing out of facility DNR order (yellow form or pink MOST form) Yellow form placed in chart (order not valid for inpatient use)      No chief complaint on file.   Discussed the use of AI scribe software for clinical note transcription  with the patient, who gave verbal consent to proceed.  History of Present Illness  89 year old female with a past medical history of diabetes, hypertension, CKD, CHF, memory loss, hyperlipidemia is evaluated for chronic disease management Pt seen and examined in her room  She is sitting comfortable in her recliner chair Denies headache, nausea, vomiting, dizzy or lightheaded Pt ambulates with a walker, no recent falls C/o intermittent joint pains She enjoys walking to the dining room to eat her meals and enjoys knitting   No concerns as per nursing staff    Past Medical History:  Diagnosis Date   Contact dermatitis and other eczema, due to unspecified cause    Cortical senile cataract    GERD (gastroesophageal reflux disease) 02/17/2013   Hyperparathyroidism, unspecified (HCC) 03/26/2010   Irritable bowel syndrome    Keratoconjunctivitis sicca, not specified as Sjogren's    Loss of weight    Macular degeneration (senile) of retina, unspecified    Memory loss    Other and unspecified hyperlipidemia    Other malaise and fatigue 06/17/2010   Pain in joint, site unspecified    Pathologic fracture of vertebrae    Reflux esophagitis    Scoliosis (and kyphoscoliosis), idiopathic    Senile osteoporosis    Type II or unspecified type diabetes mellitus without mention of complication, uncontrolled    Unspecified essential hypertension    Unspecified hereditary and idiopathic peripheral neuropathy    Unspecified vitamin D deficiency    Past Surgical History:  Procedure Laterality Date   APPENDECTOMY     BREAST LUMPECTOMY  02/05/2010   Dr. Hinton Luis Cornett   CATARACT EXTRACTION W/ INTRAOCULAR LENS  IMPLANT, BILATERAL  04/2013   Dr. Danley Dusky   CHOLECYSTECTOMY  2004   Dr. Lynnell Sato   COLONOSCOPY  2001  Dr. Tova Fresh   fibroidectomy  1950   HEMORRHOID SURGERY  1978   KYPHOPLASTY  2010   T10 & T12 Dr. Manya Sells   MEDIAL PARTIAL KNEE REPLACEMENT Left 2004   Dr. Genevive Ket   SPINE SURGERY      vertebroplasty T10, T12   SPINE SURGERY  11/02/2001    C4-5 & C5-6 Titanium Plate Placed Dr. Joanette Moynahan   THYROIDECTOMY, PARTIAL Left 2007   TOTAL KNEE ARTHROPLASTY Right 2009   Dr. Bernard Brick     reports that she has never smoked. She has never used smokeless tobacco. She reports that she does not drink alcohol  and does not use drugs. Social History   Socioeconomic History   Marital status: Widowed    Spouse name: Not on file   Number of children: 0   Years of education: Not on file   Highest education level: Not on file  Occupational History   Occupation: retired Insurance underwriter: RETIRED  Tobacco Use   Smoking status: Never   Smokeless tobacco: Never  Vaping Use   Vaping status: Never Used  Substance and Sexual Activity   Alcohol  use: No   Drug use: No   Sexual activity: Never    Partners: Male    Birth control/protection: Post-menopausal  Other Topics Concern   Not on file  Social History Narrative   Lives at [facility] [place] since 02/06/2009   Widowed    No childred   Living Will   Social Drivers of Health   Financial Resource Strain: Low Risk  (11/24/2017)   Overall Financial Resource Strain (CARDIA)    Difficulty of Paying Living Expenses: Not hard at all  Food Insecurity: No Food Insecurity (11/24/2017)   Hunger Vital Sign    Worried About Running Out of Food in the Last Year: Never true    Ran Out of Food in the Last Year: Never true  Transportation Needs: No Transportation Needs (11/24/2017)   PRAPARE - Administrator, Civil Service (Medical): No    Lack of Transportation (Non-Medical): No  Physical Activity: Insufficiently Active (11/24/2017)   Exercise Vital Sign    Days of Exercise per Week: 7 days    Minutes of Exercise per Session: 20 min  Stress: No Stress Concern Present (11/24/2017)   [employer] of Occupational Health - Occupational Stress Questionnaire    Feeling of Stress : Only a little  Social Connections:  Moderately Isolated (11/24/2017)   Social Connection and Isolation Panel [NHANES]    Frequency of Communication with Friends and Family: More than three times a week    Frequency of Social Gatherings with Friends and Family: More than three times a week    Attends Religious Services: Never    Database administrator or Organizations: No    Attends Banker Meetings: Never    Marital Status: Widowed  Intimate Partner Violence: Not At Risk (11/24/2017)   Humiliation, Afraid, Rape, and Kick questionnaire    Fear of Current or Ex-Partner: No    Emotionally Abused: No    Physically Abused: No    Sexually Abused: No    Functional Status Survey:    Family History  Problem Relation Age of Onset   Diabetes Mother    Heart disease Mother        CHF   Stroke Father    Heart disease Sister        MI   Colon cancer Neg Hx  Stomach cancer Neg Hx    Esophageal cancer Neg Hx    Rectal cancer Neg Hx    Liver cancer Neg Hx     Health Maintenance  Topic Date Due   OPHTHALMOLOGY EXAM  08/29/2022   FOOT EXAM  10/23/2022   COVID-19 Vaccine (8 - 2024-25 season) 06/10/2023   HEMOGLOBIN A1C  06/25/2023   INFLUENZA VACCINE  01/29/2024   Medicare Annual Wellness (AWV)  06/21/2024   DTaP/Tdap/Td (3 - Tdap) 12/09/2027   Pneumonia Vaccine 9+ Years old  Completed   DEXA SCAN  Completed   HPV VACCINES  Aged Out   Meningococcal B Vaccine  Aged Out   Zoster Vaccines- Shingrix  Discontinued    Allergies  Allergen Reactions   Bactrim     unknown   Betadine [Povidone Iodine]     unknown   Metformin  And Related     Hurt stomach   Nutmeg Oil (Myristica Oil)     unknown   Prednisone     Sulfa Antibiotics     unknown    Outpatient Encounter Medications as of 10/22/2023  Medication Sig   acetaminophen  (TYLENOL ) 325 MG tablet Take 650 mg by mouth every 6 (six) hours as needed for mild pain. Three times a day   amLODipine  (NORVASC ) 5 MG tablet Take 0.5 tablets (2.5 mg total) by  mouth daily.   aspirin  EC 81 MG EC tablet Take 1 tablet (81 mg total) by mouth daily.   atorvastatin  (LIPITOR) 10 MG tablet Take 10 mg by mouth daily.   betamethasone  dipropionate (DIPROLENE ) 0.05 % ointment Apply topically. Apply BID PRN to active rash on extremities . 1 app immediately after shower. Twice A Day - PRN   diclofenac Sodium (VOLTAREN) 1 % GEL Apply 2 g topically 2 (two) times daily as needed (knee pain). Apply to both knees   furosemide (LASIX) 20 MG tablet Take 20 mg by mouth. Once A Day on Mon, Wed, Fri   losartan  (COZAAR ) 50 MG tablet Take 1 tablet (50 mg total) by mouth daily.   metFORMIN  (GLUCOPHAGE -XR) 500 MG 24 hr tablet Take 500 mg by mouth 2 (two) times daily. Take 1/2 tablet   metoprolol  tartrate (LOPRESSOR ) 25 MG tablet Take 12.5 mg by mouth 2 (two) times daily. Hold Metoprolol  for SBP < 110 and DSP <50   mirtazapine (REMERON) 7.5 MG tablet Take 7.5 mg by mouth at bedtime. 1/2 tablet   nitroGLYCERIN  (NITROSTAT ) 0.4 MG SL tablet Place 0.4 mg under the tongue every 5 (five) minutes as needed for chest pain. PLACE 0.4 MG UNDER TONGUE EVERY 5 MINUTES X3 FOR CHEST PAIN, IF NO RELIEF, CALL MD OR SEND TO ED FOR FURTHER EVALUATION.   potassium chloride (MICRO-K) 10 MEQ CR capsule Take 10 mEq by mouth. Once A Day on Mon, Wed, Fri   No facility-administered encounter medications on file as of 10/22/2023.    Review of Systems  Constitutional:  Negative for fever.  Respiratory:  Negative for cough and shortness of breath.   Cardiovascular:  Negative for chest pain and leg swelling.  Gastrointestinal:  Negative for abdominal distention, abdominal pain, blood in stool, constipation, diarrhea, nausea and vomiting.  Genitourinary:  Negative for dysuria.  Neurological:  Negative for dizziness.   Negative unless indicated in HPI.  There were no vitals filed for this visit. There is no height or weight on file to calculate BMI. BP Readings from Last 3 Encounters:  10/12/23 (!)  148/60  07/08/23 (!) 170/71  07/03/23 Aaron Aas)  139/45   Wt Readings from Last 3 Encounters:  10/09/23 105 lb (47.6 kg)  07/08/23 103 lb 9.6 oz (47 kg)  07/02/23 103 lb 9.6 oz (47 kg)   Physical Exam Constitutional:      Appearance: Normal appearance.  HENT:     Head: Normocephalic and atraumatic.  Cardiovascular:     Rate and Rhythm: Normal rate and regular rhythm.  Pulmonary:     Effort: Pulmonary effort is normal. No respiratory distress.     Breath sounds: Normal breath sounds. No wheezing.  Abdominal:     General: Bowel sounds are normal. There is no distension.     Tenderness: There is no abdominal tenderness. There is no guarding.     Comments:    Musculoskeletal:        General: No swelling.  Neurological:     Mental Status: She is alert. Mental status is at baseline.     Motor: No weakness.     Labs reviewed: Basic Metabolic Panel: Recent Labs    12/24/22 0000  NA 139  K 4.5  CL 104  CO2 26*  BUN 25*  CREATININE 1.2*  CALCIUM  10.9*   Liver Function Tests: Recent Labs    12/24/22 0000  AST 21  ALT 15  ALKPHOS 57  ALBUMIN 4.1   No results for input(s): "LIPASE", "AMYLASE" in the last 8760 hours. No results for input(s): "AMMONIA" in the last 8760 hours. CBC: No results for input(s): "WBC", "NEUTROABS", "HGB", "HCT", "MCV", "PLT" in the last 8760 hours. Cardiac Enzymes: No results for input(s): "CKTOTAL", "CKMB", "CKMBINDEX", "TROPONINI" in the last 8760 hours. BNP: Invalid input(s): "POCBNP" Lab Results  Component Value Date   HGBA1C 6.9 12/24/2022   Lab Results  Component Value Date   TSH 0.65 01/30/2022   Lab Results  Component Value Date   VITAMINB12 601 12/24/2022   Lab Results  Component Value Date   FOLATE 44.1 11/13/2020   Lab Results  Component Value Date   IRON 70 11/13/2020   TIBC 307 11/13/2020   FERRITIN 22 11/13/2020    Imaging and Procedures obtained prior to SNF admission: DG Chest 1 View Result Date:  11/05/2021 CLINICAL DATA:  Fall, left arm pain EXAM: CHEST  1 VIEW COMPARISON:  None Available. FINDINGS: Lungs are clear. No pneumothorax or pleural effusion. Cardiac size within normal limits. Pulmonary vascularity is normal. Acute three-part fracture of the left humeral head is seen with fractures involving the greater tuberosity and surgical neck of the humerus. T10 and T12 vertebroplasty has been performed. IMPRESSION: Acute three-part fracture of the left humeral head. No radiographic evidence of acute cardiopulmonary disease. Electronically Signed   By: Worthy Heads M.D.   On: 11/05/2021 20:45   DG Forearm Left Result Date: 11/05/2021 CLINICAL DATA:  Fall, left arm pain EXAM: LEFT FOREARM - 2 VIEW COMPARISON:  None Available. FINDINGS: Normal alignment. No acute fracture or dislocation. Chondrocalcinosis within the radiocarpal articulation is likely degenerative in nature. Soft tissues are unremarkable. IMPRESSION: No acute fracture or dislocation. Electronically Signed   By: Worthy Heads M.D.   On: 11/05/2021 20:43   DG Pelvis 1-2 Views Result Date: 11/05/2021 CLINICAL DATA:  Fall, pelvic injury EXAM: PELVIS - 1-2 VIEW COMPARISON:  None Available. FINDINGS: Normal alignment. No acute fracture or dislocation. Moderate bilateral degenerative hip arthritis with joint space narrowing noted. Densely calcified fibroid noted within the pelvis. Soft tissues are otherwise unremarkable. IMPRESSION: No acute fracture or dislocation. Electronically Signed   By:  Worthy Heads M.D.   On: 11/05/2021 20:42   DG Humerus Left Result Date: 11/05/2021 CLINICAL DATA:  Fall, left arm pain EXAM: LEFT HUMERUS - 2+ VIEW COMPARISON:  None Available. FINDINGS: There is an acute fracture involving the greater tuberosity of the left humeral head which appears posterolaterally displaced. There is a subtle lucency extending across the surgical neck of the humerus and a nondisplaced fracture of the surgical neck is not excluded.  Humeral head appears seated within the glenoid fossa. The distal humerus is intact. IMPRESSION: Fractures of the greater tuberosity of the left humeral head and possibly the surgical neck of the left humerus. This could be better assessed with dedicated CT imaging. Electronically Signed   By: Worthy Heads M.D.   On: 11/05/2021 20:41   CT Cervical Spine Wo Contrast Result Date: 11/05/2021 CLINICAL DATA:  Neck trauma (Age >= 65y).  Fall. EXAM: CT CERVICAL SPINE WITHOUT CONTRAST TECHNIQUE: Multidetector CT imaging of the cervical spine was performed without intravenous contrast. Multiplanar CT image reconstructions were also generated. RADIATION DOSE REDUCTION: This exam was performed according to the departmental dose-optimization program which includes automated exposure control, adjustment of the mA and/or kV according to patient size and/or use of iterative reconstruction technique. COMPARISON:  None Available. FINDINGS: Alignment: Grade 1 degenerative anterolisthesis of C6 on C7 and C7 on T1. Skull base and vertebrae: No acute fracture. No primary bone lesion or focal pathologic process. Soft tissues and spinal canal: Choose 1 Disc levels: Prior anterior fusion from C4-C6. Advanced degenerative disc disease at C6-7 and C2-3. Bilateral moderate degenerative facet disease. Upper chest: Biapical scarring. Other: None IMPRESSION: Degenerative disc and facet disease. Prior ACDF C4-C6. No acute bony abnormality. Electronically Signed   By: Janeece Mechanic M.D.   On: 11/05/2021 20:27   CT Maxillofacial Wo Contrast Result Date: 11/05/2021 CLINICAL DATA:  Facial trauma, blunt.  Fall. EXAM: CT MAXILLOFACIAL WITHOUT CONTRAST TECHNIQUE: Multidetector CT imaging of the maxillofacial structures was performed. Multiplanar CT image reconstructions were also generated. RADIATION DOSE REDUCTION: This exam was performed according to the departmental dose-optimization program which includes automated exposure control, adjustment  of the mA and/or kV according to patient size and/or use of iterative reconstruction technique. COMPARISON:  None Available. FINDINGS: Osseous: Depressed fracture through the anterior wall of the left maxillary sinus. Fracture also noted through the posterior wall of the left maxillary sinus. Orbits: Fracture through the floor of the left orbit and lateral wall of the left orbit. Globes are intact. No orbital emphysema. Sinuses: Blood seen within the left maxillary sinus. Soft tissues: Soft tissue swelling over the left face and orbit. Limited intracranial: See head CT report IMPRESSION: Fractures through the left lateral orbital wall and floor of the left orbit. Fractures through the anterior and posterior walls of the left maxillary sinus. Left fills the left maxillary sinus. Electronically Signed   By: Janeece Mechanic M.D.   On: 11/05/2021 20:25   CT HEAD WO CONTRAST ( ) Result Date: 11/05/2021 CLINICAL DATA:  Head trauma, intracranial venous injury suspected, fall EXAM: CT HEAD WITHOUT CONTRAST TECHNIQUE: Contiguous axial images were obtained from the base of the skull through the vertex without intravenous contrast. RADIATION DOSE REDUCTION: This exam was performed according to the departmental dose-optimization program which includes automated exposure control, adjustment of the mA and/or kV according to patient size and/or use of iterative reconstruction technique. COMPARISON:  None Available. FINDINGS: Brain: There is atrophy and chronic small vessel disease changes. No acute intracranial abnormality. Specifically,  no hemorrhage, hydrocephalus, mass lesion, acute infarction, or significant intracranial injury. Vascular: No hyperdense vessel or unexpected calcification. Skull: No acute calvarial abnormality. Sinuses/Orbits: Fractures through the anterior wall of the left maxillary sinus and floor of the left orbit. Left lateral orbital wall fracture. Fracture through the posterior wall of the left maxillary  sinus. Blood seen within the left maxillary sinus. Other: None IMPRESSION: No acute intracranial abnormality. Atrophy, chronic small vessel disease. Fractures through the anterior and posterior wall of the maxillary sinus. Fractures through the lateral wall and floor the left orbit. Electronically Signed   By: Janeece Mechanic M.D.   On: 11/05/2021 20:23    Assessment and Plan Assessment & Plan  Major neurocognitive disorder Continue with supportive care Increase physical activity, cognitive engaging activities  Hypertension At goal Continue with amlodipine , losartan  Exercise regularly, increase physical activity Avoid salty foods  Diabetes mellitus A1c 6.9 Patient is currently on metformin  500 mg daily Will stop metformin ,  Will check A1c   Hyperlipidemia Continue with Lipitor  CHF Patient is euvolemic on exam Continue with Lasix, potassium supplements Will check CBC, BMP  Hypocalcemia Will check ionized calcium  levels  CKD stage III Avoid nephrotoxic medications Increase oral hydration Will check BMP.      Labs/tests ordered: cbc, bmp, A1c, ionized calcium    30 min Total time spent for obtaining history,  performing a medically appropriate examination and evaluation, reviewing the tests,ordering  tests,  documenting clinical information in the electronic or other health record,  ,care coordination (not separately reported)

## 2023-10-23 ENCOUNTER — Encounter: Payer: Self-pay | Admitting: Sports Medicine

## 2023-10-30 LAB — BASIC METABOLIC PANEL WITH GFR
BUN: 22 — AB (ref 4–21)
CO2: 28 — AB (ref 13–22)
Chloride: 104 (ref 99–108)
Creatinine: 1.4 — AB (ref 0.5–1.1)
Glucose: 111
Potassium: 4.9 meq/L (ref 3.5–5.1)
Sodium: 139 (ref 137–147)

## 2023-10-30 LAB — CBC AND DIFFERENTIAL
HCT: 27 — AB (ref 36–46)
Hemoglobin: 8.6 — AB (ref 12.0–16.0)
Neutrophils Absolute: 3705
Platelets: 301 K/uL (ref 150–400)
WBC: 7.5

## 2023-10-30 LAB — CBC: RBC: 3.13 — AB (ref 3.87–5.11)

## 2023-10-30 LAB — HEMOGLOBIN A1C: Hemoglobin A1C: 8.2

## 2023-10-30 LAB — COMPREHENSIVE METABOLIC PANEL WITH GFR: Calcium: 9.8 (ref 8.7–10.7)

## 2023-11-13 ENCOUNTER — Encounter: Payer: Self-pay | Admitting: Nurse Practitioner

## 2023-11-13 ENCOUNTER — Non-Acute Institutional Stay: Payer: Self-pay | Admitting: Nurse Practitioner

## 2023-11-13 DIAGNOSIS — I10 Essential (primary) hypertension: Secondary | ICD-10-CM | POA: Diagnosis not present

## 2023-11-13 DIAGNOSIS — I5032 Chronic diastolic (congestive) heart failure: Secondary | ICD-10-CM | POA: Diagnosis not present

## 2023-11-13 DIAGNOSIS — E1122 Type 2 diabetes mellitus with diabetic chronic kidney disease: Secondary | ICD-10-CM

## 2023-11-13 DIAGNOSIS — D649 Anemia, unspecified: Secondary | ICD-10-CM | POA: Diagnosis not present

## 2023-11-13 DIAGNOSIS — N183 Chronic kidney disease, stage 3 unspecified: Secondary | ICD-10-CM

## 2023-11-13 DIAGNOSIS — R4189 Other symptoms and signs involving cognitive functions and awareness: Secondary | ICD-10-CM

## 2023-11-13 NOTE — Assessment & Plan Note (Signed)
 takes Metformin , Hgb a1c 7.2 11/26/21>>6.9 12/24/22, Bun/creat 25/1.39 10/08/23

## 2023-11-13 NOTE — Assessment & Plan Note (Signed)
 Cognitive impairment, at her baseline. Vit B 601 12/24/22, declined further eye exam and DEXA

## 2023-11-13 NOTE — Progress Notes (Signed)
 Location:   [facility] Room Number: 814 Place of Service:  ALF (13) Provider: Abner Hoffman Demaree Liberto NP  Jef Futch X, NP  Patient Care Team: Refugio Vandevoorde X, NP as PCP - General (Internal Medicine) [place] Amirrah Quigley X, NP as Nurse Practitioner (Nurse Practitioner) Tasia Farr, MD as Consulting Physician (Endocrinology) Corie Diamond, MD as Consulting Physician (Ophthalmology) Christie Cox, MD as Consulting Physician (Orthopedic Surgery) Adelaide Adjutant, MD as Consulting Physician (Physical Medicine and Rehabilitation) Manya Sells, MD as Consulting Physician (Neurosurgery) Tami Falcon, MD as Consulting Physician (Gastroenterology) [place], Peter M, MD as Consulting Physician (Cardiology) Lillian Rein, MD as Consulting Physician (Gynecology)  Extended Emergency Contact Information Primary Emergency Contact: Earnest Goad [facility] [place] Mobile Phone: [phone] Relation: Niece Secondary Emergency Contact: Dustin Gimenez Mobile Phone: [phone] Relation: Other Preferred language: Andra Banco Interpreter needed? No Guardian: Earnest Goad Mobile Phone: [phone] Relation: Legal Guardian Preferred language: English Interpreter needed? No  Code Status: DNR Goals of care: Advanced Directive information    07/02/2023    3:14 PM  Advanced Directives  Does Patient Have a Medical Advance Directive? Yes  Type of Estate agent of [place];Living will;Out of facility DNR (pink MOST or yellow form)  Does patient want to make changes to medical advance directive? No - Patient declined  Copy of Healthcare Power of Attorney in Chart? Yes - validated most recent copy scanned in chart (See row information)  Pre-existing out of facility DNR order (yellow form or pink MOST form) Yellow form placed in chart (order not valid for inpatient use)     Chief Complaint  Patient presents with   Acute Visit    HPI:  Pt is a 89 y.o. female seen  today for an acute visit for dropping Hgb.    Hgb 10.5 10/08/23 11/12/23 wbc 7.0, Hgb 8.8, plt 220, Iron 338, Vit B12 491, add Iron 325mg  MWF, Protonix 40mg  every day, Miralax daily. CBC/diff one week.   The patient denied upset stomach, nausea, vomiting, abd pain, or blood in urine or stools. Denied headache, dizziness, chest pain/pressure, palpitation, or SOB   Hypercalcemia, Ca 11.7>>9.9(corrected to 10.7, PTH 35, s/p Zometa  x1, PTHrP <2.0 11/11/20,  Hyperparathyroidism, workup with Dr. Ronelle Coffee, Ca 10.3 01/30/22<<10.9 12/24/22>>10.5 10/08/23, off MVI with minerals 01/06/23             T2DM, takes Metformin , Hgb a1c 7.2 11/26/21>>6.9 12/24/22             HTN, occasionally mild Sbp in 140s, takes Losartan , Metoprolol , Amlodipine .              CKD stage 3 Bun/creat 25/1.39 10/08/23             CHF, chronic edema BLE, 04/16/21 Echo EF 40-45%, diastolic dysfunction. on Furosemide             Cognitive impairment, at her baseline. Vit B 601 12/24/22, declined further eye exam and DEXA             Weight loss, stable, on Mirtazapine 7.5mg  every day, TSH 0.65 01/30/22             Hyperlipidemia, takes Atorvastatin , LDL 58 12/24/22             Anemia, Hgb 10.5 10/08/23, 8.8 11/13/23                  Past Medical History:  Diagnosis Date   Contact dermatitis and other eczema, due to unspecified cause  Cortical senile cataract    GERD (gastroesophageal reflux disease) 02/17/2013   Hyperparathyroidism, unspecified (HCC) 03/26/2010   Irritable bowel syndrome    Keratoconjunctivitis sicca, not specified as Sjogren's    Loss of weight    Macular degeneration (senile) of retina, unspecified    Memory loss    Other and unspecified hyperlipidemia    Other malaise and fatigue 06/17/2010   Pain in joint, site unspecified    Pathologic fracture of vertebrae    Reflux esophagitis    Scoliosis (and kyphoscoliosis), idiopathic    Senile osteoporosis    Type II or unspecified type diabetes mellitus without mention of  complication, uncontrolled    Unspecified essential hypertension    Unspecified hereditary and idiopathic peripheral neuropathy    Unspecified vitamin D deficiency    Past Surgical History:  Procedure Laterality Date   APPENDECTOMY     BREAST LUMPECTOMY  02/05/2010   Dr. Hinton Luis Cornett   CATARACT EXTRACTION W/ INTRAOCULAR LENS  IMPLANT, BILATERAL  04/2013   Dr. Danley Dusky   CHOLECYSTECTOMY  2004   Dr. Lynnell Sato   COLONOSCOPY  2001   Dr. Tova Fresh   fibroidectomy  1950   HEMORRHOID SURGERY  1978   KYPHOPLASTY  2010   T10 & T12 Dr. Manya Sells   MEDIAL PARTIAL KNEE REPLACEMENT Left 2004   Dr. Genevive Ket   SPINE SURGERY     vertebroplasty T10, T12   SPINE SURGERY  11/02/2001    C4-5 & C5-6 Titanium Plate Placed Dr. Joanette Moynahan   THYROIDECTOMY, PARTIAL Left 2007   TOTAL KNEE ARTHROPLASTY Right 2009   Dr. Bernard Brick     Allergies  Allergen Reactions   Bactrim     unknown   Betadine [Povidone Iodine]     unknown   Metformin  And Related     Hurt stomach   Nutmeg Oil (Myristica Oil)     unknown   Prednisone     Sulfa Antibiotics     unknown    Allergies as of 11/13/2023       Reactions   Bactrim    unknown   Betadine [povidone Iodine]    unknown   Metformin  And Related    Hurt stomach   Nutmeg Oil (myristica Oil)    unknown   Prednisone     Sulfa Antibiotics    unknown        Medication List        Accurate as of Nov 13, 2023 11:59 PM. If you have any questions, ask your nurse or doctor.          acetaminophen  325 MG tablet Commonly known as: TYLENOL  Take 650 mg by mouth every 6 (six) hours as needed for mild pain. Three times a day   amLODipine  5 MG tablet Commonly known as: NORVASC  Take 0.5 tablets (2.5 mg total) by mouth daily.   aspirin  EC 81 MG tablet Take 1 tablet (81 mg total) by mouth daily.   atorvastatin  10 MG tablet Commonly known as: LIPITOR Take 10 mg by mouth daily.   betamethasone  dipropionate 0.05 % ointment Commonly known as: DIPROLENE  Apply topically.  Apply BID PRN to active rash on extremities . 1 app immediately after shower. Twice A Day - PRN   diclofenac Sodium 1 % Gel Commonly known as: VOLTAREN Apply 2 g topically 2 (two) times daily as needed (knee pain). Apply to both knees   furosemide 20 MG tablet Commonly known as: LASIX Take 20 mg by mouth. Once A Day on Mon, Wed,  Fri   losartan  50 MG tablet Commonly known as: Cozaar  Take 1 tablet (50 mg total) by mouth daily.   metFORMIN  500 MG 24 hr tablet Commonly known as: GLUCOPHAGE -XR Take 500 mg by mouth 2 (two) times daily. Take 1/2 tablet   metoprolol  tartrate 25 MG tablet Commonly known as: LOPRESSOR  Take 12.5 mg by mouth 2 (two) times daily. Hold Metoprolol  for SBP < 110 and DSP <50   mirtazapine 7.5 MG tablet Commonly known as: REMERON Take 7.5 mg by mouth at bedtime. 1/2 tablet   nitroGLYCERIN  0.4 MG SL tablet Commonly known as: NITROSTAT  Place 0.4 mg under the tongue every 5 (five) minutes as needed for chest pain. PLACE 0.4 MG UNDER TONGUE EVERY 5 MINUTES X3 FOR CHEST PAIN, IF NO RELIEF, CALL MD OR SEND TO ED FOR FURTHER EVALUATION.   potassium chloride 10 MEQ CR capsule Commonly known as: MICRO-K Take 10 mEq by mouth. Once A Day on Mon, Wed, Fri        Review of Systems  Constitutional:  Negative for fatigue, fever and unexpected weight change.  HENT:  Positive for hearing loss. Negative for congestion and trouble swallowing.   Eyes:  Negative for visual disturbance.  Respiratory:  Negative for cough and shortness of breath.   Cardiovascular:  Positive for leg swelling. Negative for chest pain and palpitations.  Gastrointestinal: Negative.  Negative for abdominal pain, constipation and diarrhea.       Urge incontinent of bowel.   Genitourinary:  Negative for dysuria and urgency.  Musculoskeletal:  Positive for arthralgias and gait problem.       Left knee pain when walking too much.   Skin:  Negative for color change.  Neurological:  Negative for  speech difficulty, weakness and light-headedness.  Psychiatric/Behavioral:  Negative for confusion and sleep disturbance. The patient is not nervous/anxious.     Immunization History  Administered Date(s) Administered   Fluad Quad(high Dose 65+) 04/01/2023   Influenza Whole 03/30/2012, 04/13/2013, 04/04/2018   Influenza, High Dose Seasonal PF 04/02/2019   Influenza-Unspecified 04/17/2014, 03/29/2015, 04/18/2021, 04/21/2022   Moderna SARS-COV2 Booster Vaccination 05/08/2020, 04/15/2023   Moderna Sars-Covid-2 Vaccination 07/02/2019, 07/30/2019, 11/27/2020   PFIZER Comirnaty(Gray Top)Covid-19 Tri-Sucrose Vaccine 03/19/2021   Pfizer Covid-19 Vaccine Bivalent Booster 70yrs & up 05/01/2022   Pneumococcal Conjugate-13 12/08/2017   Pneumococcal Polysaccharide-23 06/30/2001   Td 06/30/2001, 12/08/2017   Unspecified SARS-COV-2 Vaccination 05/01/2022   Pertinent  Health Maintenance Due  Topic Date Due   OPHTHALMOLOGY EXAM  08/29/2022   FOOT EXAM  10/23/2022   HEMOGLOBIN A1C  06/25/2023   INFLUENZA VACCINE  01/29/2024   DEXA SCAN  Completed      11/12/2020    7:45 AM 11/13/2020    2:00 AM 11/13/2020   10:00 AM 11/05/2021    7:37 PM 06/17/2022    9:10 AM  Fall Risk  Falls in the past year?     0  Was there an injury with Fall?     0  Fall Risk Category Calculator     0  Fall Risk Category (Retired)     Low  (RETIRED) Patient Fall Risk Level Moderate fall risk Moderate fall risk Moderate fall risk High fall risk Low fall risk  Patient at Risk for Falls Due to     No Fall Risks  Fall risk Follow up     Falls evaluation completed   Functional Status Survey:    Vitals:   11/13/23 1412  BP: (!) 132/58  Pulse:  64  Resp: 20  Temp: 98.5 F (36.9 C)  SpO2: 99%  Weight: 103 lb (46.7 kg)   Body mass index is 18.84 kg/m. Physical Exam Vitals and nursing note reviewed.  Constitutional:      Appearance: Normal appearance.  HENT:     Head: Normocephalic and atraumatic.      Mouth/Throat:     Mouth: Mucous membranes are moist.  Eyes:     Extraocular Movements: Extraocular movements intact.     Conjunctiva/sclera: Conjunctivae normal.     Pupils: Pupils are equal, round, and reactive to light.  Cardiovascular:     Rate and Rhythm: Normal rate and regular rhythm.     Heart sounds: No murmur heard.    Comments: DP pulses present R+L Pulmonary:     Effort: Pulmonary effort is normal.     Breath sounds: No rales.  Abdominal:     General: Bowel sounds are normal. There is no distension.     Palpations: Abdomen is soft.     Tenderness: There is no abdominal tenderness. There is no right CVA tenderness, left CVA tenderness, guarding or rebound.  Genitourinary:    Comments: External hemorrhoids, no injury or bleeding.  Musculoskeletal:     Cervical back: Normal range of motion and neck supple.     Right lower leg: Edema present.     Left lower leg: Edema present.     Comments: Kyphoscoliosis. Minimal edema BLE  Skin:    General: Skin is warm and dry.     Comments: Yellow, thick, long toenails.   Neurological:     General: No focal deficit present.     Mental Status: She is alert. Mental status is at baseline.     Motor: No weakness.     Coordination: Coordination normal.     Gait: Gait abnormal.     Comments: Oriented to person, place.   Psychiatric:        Mood and Affect: Mood normal.        Behavior: Behavior normal.        Thought Content: Thought content normal.        Judgment: Judgment normal.     Labs reviewed: Recent Labs    12/24/22 0000  NA 139  K 4.5  CL 104  CO2 26*  BUN 25*  CREATININE 1.2*  CALCIUM  10.9*   Recent Labs    12/24/22 0000  AST 21  ALT 15  ALKPHOS 57  ALBUMIN 4.1   No results for input(s): "WBC", "NEUTROABS", "HGB", "HCT", "MCV", "PLT" in the last 8760 hours. Lab Results  Component Value Date   TSH 0.65 01/30/2022   Lab Results  Component Value Date   HGBA1C 6.9 12/24/2022   Lab Results  Component  Value Date   CHOL 133 12/24/2022   HDL 59 12/24/2022   LDLCALC 58 12/24/2022   TRIG 80 12/24/2022   CHOLHDL 2.4 06/27/2018    Significant Diagnostic Results in last 30 days:  No results found.  Assessment/Plan: Anemia Hgb 10.5 10/08/23 11/12/23 wbc 7.0, Hgb 8.8, plt 220, Iron 338, Vit B12 491, add Iron 325mg  MWF, Protonix 40mg  every day, Miralax daily. CBC/diff one week.   CKD stage 3 due to type 2 diabetes mellitus (HCC) takes Metformin , Hgb a1c 7.2 11/26/21>>6.9 12/24/22, Bun/creat 25/1.39 10/08/23  Essential hypertension Occasionally mild Sbp in 140s, takes Losartan , Metoprolol , Amlodipine .   Diastolic CHF (HCC) chronic edema BLE, 04/16/21 Echo EF 40-45%, diastolic dysfunction. on Furosemide  Cognitive impairment Cognitive  impairment, at her baseline. Vit B 601 12/24/22, declined further eye exam and DEXA    Family/ staff Communication: plan of care reviewed with the patient and charge nurse.   Labs/tests ordered:  CBC/diff one week

## 2023-11-13 NOTE — Assessment & Plan Note (Signed)
 Occasionally mild Sbp in 140s, takes Losartan , Metoprolol , Amlodipine .

## 2023-11-13 NOTE — Assessment & Plan Note (Signed)
chronic edema BLE, 04/16/21 Echo EF 40-45%, diastolic dysfunction. on Furosemide 

## 2023-11-13 NOTE — Assessment & Plan Note (Signed)
 Hgb 10.5 10/08/23 11/12/23 wbc 7.0, Hgb 8.8, plt 220, Iron 338, Vit B12 491, add Iron 325mg  MWF, Protonix 40mg  every day, Miralax daily. CBC/diff one week.

## 2023-11-19 LAB — CBC AND DIFFERENTIAL
HCT: 31 — AB (ref 36–46)
Hemoglobin: 9.9 — AB (ref 12.0–16.0)
Neutrophils Absolute: 4495
Platelets: 286 K/uL (ref 150–400)
WBC: 8.9

## 2023-11-19 LAB — CBC: RBC: 3.53 — AB (ref 3.87–5.11)

## 2024-01-19 ENCOUNTER — Non-Acute Institutional Stay: Payer: Self-pay | Admitting: Nurse Practitioner

## 2024-01-19 ENCOUNTER — Encounter: Payer: Self-pay | Admitting: Nurse Practitioner

## 2024-01-19 DIAGNOSIS — E0821 Diabetes mellitus due to underlying condition with diabetic nephropathy: Secondary | ICD-10-CM

## 2024-01-19 DIAGNOSIS — K219 Gastro-esophageal reflux disease without esophagitis: Secondary | ICD-10-CM

## 2024-01-19 DIAGNOSIS — I5032 Chronic diastolic (congestive) heart failure: Secondary | ICD-10-CM | POA: Diagnosis not present

## 2024-01-19 DIAGNOSIS — D649 Anemia, unspecified: Secondary | ICD-10-CM

## 2024-01-19 DIAGNOSIS — R413 Other amnesia: Secondary | ICD-10-CM

## 2024-01-19 DIAGNOSIS — I10 Essential (primary) hypertension: Secondary | ICD-10-CM | POA: Diagnosis not present

## 2024-01-19 DIAGNOSIS — R634 Abnormal weight loss: Secondary | ICD-10-CM

## 2024-01-19 DIAGNOSIS — E785 Hyperlipidemia, unspecified: Secondary | ICD-10-CM

## 2024-01-19 NOTE — Progress Notes (Unsigned)
 Location:   [facility] Room Number: 814-A Place of Service:  [phone]) Provider:  Abrey Bradway, NP  PCP: Daneya Hartgrove X, NP  Patient Care Team: Leno Mathes X, NP as PCP - General (Internal Medicine) [place] Tamma Brigandi X, NP as Nurse Practitioner (Nurse Practitioner) Tommas Pears, MD as Consulting Physician (Endocrinology) Camillo Golas, MD as Consulting Physician (Ophthalmology) Rubie Kemps, MD as Consulting Physician (Orthopedic Surgery) Bonner Ade, MD as Consulting Physician (Physical Medicine and Rehabilitation) Unice Pac, MD as Consulting Physician (Neurosurgery) Kristie Lamprey, MD as Consulting Physician (Gastroenterology) [place], Peter M, MD as Consulting Physician (Cardiology) Cleotilde Ronal RAMAN, MD as Consulting Physician (Gynecology)  Extended Emergency Contact Information Primary Emergency Contact: Jama Mom [facility] [place] Mobile Phone: [phone] Relation: Niece Secondary Emergency Contact: Joan Arrant Mobile Phone: [phone] Relation: Other Preferred language: Isadora Interpreter needed? No Guardian: Jama Mom Mobile Phone: [phone] Relation: Legal Guardian Preferred language: English Interpreter needed? No  Code Status:  DNR Goals of care: Advanced Directive information    01/19/2024   11:00 AM  Advanced Directives  Does Patient Have a Medical Advance Directive? Yes  Type of Estate agent of [place];Living will;Out of facility DNR (pink MOST or yellow form)  Does patient want to make changes to medical advance directive? No - Patient declined  Copy of Healthcare Power of Attorney in Chart? Yes - validated most recent copy scanned in chart (See row information)     Chief Complaint  Patient presents with  . Medical Management of Chronic Issues    HPI:  Pt is a 89 y.o. female seen today for managing chronic medical conditions.             The patient complained of  pain in the center of the chest and nauseous, no radiating pain, denied headache, dizziness, chest pressure, palpitation, or SOB, Mylanta with effective.  No O2 desaturation, the patient is afebrile.              Hypercalcemia, Ca 11.7>>9.9(corrected to 10.7, PTH 35, s/p Zometa  x1, PTHrP <2.0 11/11/20,  Hyperparathyroidism, workup with Dr. Tommas, Ca 10.3 01/30/22<<10.9 12/24/22>>10.5 10/08/23, off MVI with minerals 01/06/23             T2DM, off Metformin , Hgb a1c 7.2 11/26/21>>6.9 12/24/22<<8.2 10/30/23             HTN, occasionally mild Sbp in 140s, takes Losartan , Metoprolol , Amlodipine .              CKD stage 3 Bun/creat 22/1.4 10/30/23             CHF, chronic edema BLE, 04/16/21 Echo EF 40-45%, diastolic dysfunction. on Furosemide             Cognitive impairment, at her baseline. Vit B 601 12/24/22, declined further eye exam and DEXA             Weight loss, stable, on Mirtazapine 7.5mg  every day, TSH 0.65 01/30/22             Hyperlipidemia, takes Atorvastatin , LDL 58 12/24/22             Anemia, Hgb 10.5 10/08/23, 8.8 11/13/23, 9.9 11/19/23, on Iron. Iron 33, Vit B12 491 11/12/23  GERD, taking Pantoprazole.                Past Medical History:  Diagnosis Date  . Contact dermatitis and other eczema, due to unspecified cause   . Cortical senile  cataract   . GERD (gastroesophageal reflux disease) 02/17/2013  . Hyperparathyroidism, unspecified (HCC) 03/26/2010  . Irritable bowel syndrome   . Keratoconjunctivitis sicca, not specified as Sjogren's   . Loss of weight   . Macular degeneration (senile) of retina, unspecified   . Memory loss   . Other and unspecified hyperlipidemia   . Other malaise and fatigue 06/17/2010  . Pain in joint, site unspecified   . Pathologic fracture of vertebrae   . Reflux esophagitis   . Scoliosis (and kyphoscoliosis), idiopathic   . Senile osteoporosis   . Type II or unspecified type diabetes mellitus without mention of complication, uncontrolled   . Unspecified essential  hypertension   . Unspecified hereditary and idiopathic peripheral neuropathy   . Unspecified vitamin D deficiency    Past Surgical History:  Procedure Laterality Date  . APPENDECTOMY    . BREAST LUMPECTOMY  02/05/2010   Dr. Charlena Shipper  . CATARACT EXTRACTION W/ INTRAOCULAR LENS  IMPLANT, BILATERAL  04/2013   Dr. Camillo  . CHOLECYSTECTOMY  2004   Dr. Lily  . COLONOSCOPY  2001   Dr. Kristie  . fibroidectomy  1950  . HEMORRHOID SURGERY  1978  . KYPHOPLASTY  2010   T10 & T12 Dr. Fairy Levels  . MEDIAL PARTIAL KNEE REPLACEMENT Left 2004   Dr. Rubie  . SPINE SURGERY     vertebroplasty T10, T12  . SPINE SURGERY  11/02/2001    C4-5 & C5-6 Titanium Plate Placed Dr. Gaither  . THYROIDECTOMY, PARTIAL Left 2007  . TOTAL KNEE ARTHROPLASTY Right 2009   Dr. Ernie     Allergies  Allergen Reactions  . Bactrim     unknown  . Betadine [Povidone Iodine]     unknown  . Metformin  And Related     Hurt stomach  . Nutmeg Oil (Myristica Oil)     unknown  . Prednisone    . Sulfa Antibiotics     unknown    Allergies as of 01/19/2024       Reactions   Bactrim    unknown   Betadine [povidone Iodine]    unknown   Metformin  And Related    Hurt stomach   Nutmeg Oil (myristica Oil)    unknown   Prednisone     Sulfa Antibiotics    unknown        Medication List        Accurate as of January 19, 2024 12:39 PM. If you have any questions, ask your nurse or doctor.          STOP taking these medications    metFORMIN  500 MG 24 hr tablet Commonly known as: GLUCOPHAGE -XR Stopped by: Justinian Miano X Mayra Brahm       TAKE these medications    acetaminophen  325 MG tablet Commonly known as: TYLENOL  Take 650 mg by mouth every 6 (six) hours as needed for mild pain. Three times a day   acetaminophen  325 MG tablet Commonly known as: TYLENOL  Take 650 mg by mouth every other day.   amLODipine  5 MG tablet Commonly known as: NORVASC  Take 0.5 tablets (2.5 mg total) by mouth daily.   aspirin  EC 81 MG  tablet Take 1 tablet (81 mg total) by mouth daily.   atorvastatin  10 MG tablet Commonly known as: LIPITOR Take 10 mg by mouth daily.   betamethasone  dipropionate 0.05 % ointment Commonly known as: DIPROLENE  Apply 1 Application topically as needed.   diclofenac Sodium 1 % Gel Commonly known as: VOLTAREN Apply 2  g topically daily. Apply to both knees   ferrous sulfate 325 (65 FE) MG tablet Take 325 mg by mouth 3 (three) times a week. Monday, Wednesday, and Friday.   furosemide 20 MG tablet Commonly known as: LASIX Take 20 mg by mouth. Once A Day on Mon, Wed, Fri   losartan  25 MG tablet Commonly known as: COZAAR  Take 25 mg by mouth every morning. What changed: Another medication with the same name was removed. Continue taking this medication, and follow the directions you see here. Changed by: Paw Karstens X Kimika Streater   metoprolol  tartrate 25 MG tablet Commonly known as: LOPRESSOR  Take 12.5 mg by mouth 2 (two) times daily. Hold Metoprolol  for SBP < 110 and DSP <50   mirtazapine 7.5 MG tablet Commonly known as: REMERON Take 7.5 mg by mouth at bedtime. 1/2 tablet   nitroGLYCERIN  0.4 MG SL tablet Commonly known as: NITROSTAT  Place 0.4 mg under the tongue every 5 (five) minutes as needed for chest pain. PLACE 0.4 MG UNDER TONGUE EVERY 5 MINUTES X3 FOR CHEST PAIN, IF NO RELIEF, CALL MD OR SEND TO ED FOR FURTHER EVALUATION.   pantoprazole 40 MG tablet Commonly known as: PROTONIX Take 40 mg by mouth daily.   polyethylene glycol 17 g packet Commonly known as: MIRALAX / GLYCOLAX Take 17 g by mouth daily as needed.   potassium chloride 10 MEQ CR capsule Commonly known as: MICRO-K Take 10 mEq by mouth 3 (three) times a week. Monday, Wednesday, and Friday.        Review of Systems  Constitutional:  Negative for fatigue, fever and unexpected weight change.  HENT:  Positive for hearing loss. Negative for congestion and trouble swallowing.   Eyes:  Negative for visual disturbance.   Respiratory:  Negative for cough and shortness of breath.   Cardiovascular:  Positive for leg swelling. Negative for chest pain and palpitations.  Gastrointestinal: Negative.  Negative for abdominal pain, constipation and diarrhea.       Urge incontinent of bowel.  Substernal pain, nauseous resolved after Mylanta.    Genitourinary:  Negative for dysuria and urgency.  Musculoskeletal:  Positive for arthralgias and gait problem.       Left knee pain when walking too much.   Skin:  Negative for color change.  Neurological:  Negative for speech difficulty, weakness and light-headedness.  Psychiatric/Behavioral:  Negative for confusion and sleep disturbance. The patient is not nervous/anxious.     Immunization History  Administered Date(s) Administered  . Fluad Quad(high Dose 65+) 04/01/2023  . Influenza Whole 03/30/2012, 04/13/2013, 04/04/2018  . Influenza, High Dose Seasonal PF 04/02/2019  . Influenza-Unspecified 04/17/2014, 03/29/2015, 04/18/2021, 04/21/2022  . Moderna SARS-COV2 Booster Vaccination 05/08/2020, 04/15/2023  . Moderna Sars-Covid-2 Vaccination 07/02/2019, 07/30/2019, 11/27/2020  . PFIZER Comirnaty(Gray Top)Covid-19 Tri-Sucrose Vaccine 03/19/2021  . Pfizer Covid-19 Vaccine Bivalent Booster 47yrs & up 05/01/2022  . Pneumococcal Conjugate-13 12/08/2017  . Pneumococcal Polysaccharide-23 06/30/2001  . Td 06/30/2001, 12/08/2017  . Unspecified SARS-COV-2 Vaccination 05/01/2022   Pertinent  Health Maintenance Due  Topic Date Due  . OPHTHALMOLOGY EXAM  08/29/2022  . FOOT EXAM  10/23/2022  . INFLUENZA VACCINE  01/29/2024  . HEMOGLOBIN A1C  05/01/2024  . DEXA SCAN  Completed      11/12/2020    7:45 AM 11/13/2020    2:00 AM 11/13/2020   10:00 AM 11/05/2021    7:37 PM 06/17/2022    9:10 AM  Fall Risk  Falls in the past year?     0  Was there an injury with Fall?     0  Fall Risk Category Calculator     0  Fall Risk Category (Retired)     Low   (RETIRED) Patient Fall Risk  Level Moderate fall risk  Moderate fall risk  Moderate fall risk  High fall risk  Low fall risk   Patient at Risk for Falls Due to     No Fall Risks  Fall risk Follow up     Falls evaluation completed      Data saved with a previous flowsheet row definition   Functional Status Survey:    Vitals:   01/19/24 1041  BP: (!) 146/71  Pulse: 66  Resp: 18  Temp: (!) 97.2 F (36.2 C)  SpO2: 98%  Weight: 106 lb (48.1 kg)  Height: 5' 2 (1.575 m)   Body mass index is 19.39 kg/m. Physical Exam Vitals and nursing note reviewed.  Constitutional:      Appearance: Normal appearance.  HENT:     Head: Normocephalic and atraumatic.     Mouth/Throat:     Mouth: Mucous membranes are moist.  Eyes:     Extraocular Movements: Extraocular movements intact.     Conjunctiva/sclera: Conjunctivae normal.     Pupils: Pupils are equal, round, and reactive to light.  Cardiovascular:     Rate and Rhythm: Normal rate and regular rhythm.     Heart sounds: No murmur heard.    Comments: DP pulses present R+L Pulmonary:     Effort: Pulmonary effort is normal.     Breath sounds: No rales.  Abdominal:     General: Bowel sounds are normal. There is no distension.     Palpations: Abdomen is soft.     Tenderness: There is no abdominal tenderness. There is no right CVA tenderness, left CVA tenderness, guarding or rebound.  Genitourinary:    Comments: External hemorrhoids, no injury or bleeding.  Musculoskeletal:     Cervical back: Normal range of motion and neck supple.     Right lower leg: Edema present.     Left lower leg: Edema present.     Comments: Kyphoscoliosis. Minimal edema BLE  Skin:    General: Skin is warm and dry.     Comments: Yellow, thick, long toenails.   Neurological:     General: No focal deficit present.     Mental Status: She is alert. Mental status is at baseline.     Motor: No weakness.     Coordination: Coordination normal.     Gait: Gait abnormal.     Comments: Oriented to  person, place.   Psychiatric:        Mood and Affect: Mood normal.        Behavior: Behavior normal.        Thought Content: Thought content normal.        Judgment: Judgment normal.     Labs reviewed: Recent Labs    10/30/23 0000  NA 139  K 4.9  CL 104  CO2 28*  BUN 22*  CREATININE 1.4*  CALCIUM  9.8   No results for input(s): AST, ALT, ALKPHOS, BILITOT, PROT, ALBUMIN in the last 8760 hours. Recent Labs    10/30/23 0000 11/19/23 0000  WBC 7.5 8.9  NEUTROABS 3,705.00 4,495.00  HGB 8.6* 9.9*  HCT 27* 31*  PLT 301 286   Lab Results  Component Value Date   TSH 0.65 01/30/2022   Lab Results  Component Value Date   HGBA1C  8.2 10/30/2023   Lab Results  Component Value Date   CHOL 133 12/24/2022   HDL 59 12/24/2022   LDLCALC 58 12/24/2022   TRIG 80 12/24/2022   CHOLHDL 2.4 06/27/2018    Significant Diagnostic Results in last 30 days:  No results found.  Assessment/Plan GERD (gastroesophageal reflux disease) Supples started on this con for and the nauseous episode this morning, resolved after Mylanta Will increase pantoprazole 40 mg to twice daily for 1 week then will resume daily CBC/diff, CMP/eGFR, TSH, Hgb A1c, lipid panel.   Diabetes due to underlying condition w diabetic nephropathy (HCC) off Metformin , Hgb a1c 7.2 11/26/21>>6.9 12/24/22<<8.2 10/30/23, update Hgb A1c Bun/creat 22/1.4 10/30/23  Essential hypertension occasionally mild Sbp in 140s, takes Losartan , Metoprolol , Amlodipine .  Diastolic CHF (HCC)  chronic edema BLE, 04/16/21 Echo EF 40-45%, diastolic dysfunction. on Furosemide  Memory loss at her baseline. Vit B 601 12/24/22, declined further eye exam and DEXA  Weight loss stable, on Mirtazapine 7.5mg  every day, TSH 0.65 01/30/22, update TSH  Anemia  Hgb 10.5 10/08/23, 8.8 11/13/23, 9.9 11/19/23, on Iron   Hyperlipidemia takes Atorvastatin , LDL 58 12/24/22, update lipid panel     Family/ staff Communication: Plan of care reviewed  with the patient and charge nurse  Labs/tests ordered:  CBC/diff, CMP/eGFR, TSH, Hgb A1c, lipid panel.

## 2024-01-19 NOTE — Assessment & Plan Note (Signed)
 off Metformin , Hgb a1c 7.2 11/26/21>>6.9 12/24/22<<8.2 10/30/23, update Hgb A1c Bun/creat 22/1.4 10/30/23

## 2024-01-19 NOTE — Assessment & Plan Note (Signed)
 Supples started on this con for and the nauseous episode this morning, resolved after Mylanta Will increase pantoprazole 40 mg to twice daily for 1 week then will resume daily CBC/diff, CMP/eGFR, TSH, Hgb A1c, lipid panel.

## 2024-01-19 NOTE — Assessment & Plan Note (Signed)
 occasionally mild Sbp in 140s, takes Losartan , Metoprolol , Amlodipine .

## 2024-01-19 NOTE — Assessment & Plan Note (Signed)
chronic edema BLE, 04/16/21 Echo EF 40-45%, diastolic dysfunction. on Furosemide 

## 2024-01-19 NOTE — Assessment & Plan Note (Signed)
 takes Atorvastatin , LDL 58 12/24/22, update lipid panel

## 2024-01-19 NOTE — Assessment & Plan Note (Signed)
 stable, on Mirtazapine 7.5mg  every day, TSH 0.65 01/30/22, update TSH

## 2024-01-19 NOTE — Assessment & Plan Note (Signed)
 at her baseline. Vit B 601 12/24/22, declined further eye exam and DEXA

## 2024-01-19 NOTE — Assessment & Plan Note (Signed)
 Hgb 10.5 10/08/23, 8.8 11/13/23, 9.9 11/19/23, on Iron

## 2024-01-21 LAB — HEPATIC FUNCTION PANEL
ALT: 9 U/L (ref 7–35)
AST: 11 — AB (ref 13–35)
Bilirubin, Total: 0.4

## 2024-01-21 LAB — BASIC METABOLIC PANEL WITH GFR
BUN: 33 — AB (ref 4–21)
CO2: 29 — AB (ref 13–22)
Chloride: 103 (ref 99–108)
Glucose: 182
Potassium: 4.5 meq/L (ref 3.5–5.1)
Sodium: 137 (ref 137–147)

## 2024-01-21 LAB — TSH: TSH: 0.38 — AB (ref 0.41–5.90)

## 2024-01-21 LAB — COMPREHENSIVE METABOLIC PANEL WITH GFR
Globulin: 2
eGFR: 28

## 2024-01-21 LAB — CBC AND DIFFERENTIAL
HCT: 31 — AB (ref 36–46)
Hemoglobin: 10 — AB (ref 12.0–16.0)
Neutrophils Absolute: 3674
Platelets: 244 K/uL (ref 150–400)
WBC: 7.8

## 2024-01-21 LAB — LIPID PANEL
Cholesterol: 98 (ref 0–200)
LDL Cholesterol: 44
Triglycerides: 55 (ref 40–160)

## 2024-01-21 LAB — HEMOGLOBIN A1C: Hemoglobin A1C: 11.1

## 2024-01-21 LAB — CBC: RBC: 3.47 — AB (ref 3.87–5.11)

## 2024-01-22 ENCOUNTER — Encounter: Payer: Self-pay | Admitting: Nurse Practitioner

## 2024-01-22 ENCOUNTER — Encounter: Payer: Self-pay | Admitting: Sports Medicine

## 2024-01-22 ENCOUNTER — Non-Acute Institutional Stay: Admitting: Sports Medicine

## 2024-01-22 DIAGNOSIS — E1122 Type 2 diabetes mellitus with diabetic chronic kidney disease: Secondary | ICD-10-CM | POA: Diagnosis not present

## 2024-01-22 DIAGNOSIS — R7989 Other specified abnormal findings of blood chemistry: Secondary | ICD-10-CM

## 2024-01-22 DIAGNOSIS — N1832 Chronic kidney disease, stage 3b: Secondary | ICD-10-CM

## 2024-01-22 DIAGNOSIS — M159 Polyosteoarthritis, unspecified: Secondary | ICD-10-CM | POA: Diagnosis not present

## 2024-01-22 NOTE — Progress Notes (Signed)
 Location:  [facility] Room Number: [id number] Place of Service:  ALF [phone]) Provider:  Sherlynn Albert, MD   Mast, Man X, NP  Patient Care Team: Mast, Man X, NP as PCP - General (Internal Medicine) [place] Mast, Man X, NP as Nurse Practitioner (Nurse Practitioner) Tommas Pears, MD as Consulting Physician (Endocrinology) Camillo Golas, MD as Consulting Physician (Ophthalmology) Rubie Kemps, MD as Consulting Physician (Orthopedic Surgery) Bonner Ade, MD as Consulting Physician (Physical Medicine and Rehabilitation) Unice Pac, MD as Consulting Physician (Neurosurgery) Kristie Lamprey, MD as Consulting Physician (Gastroenterology) [place], Peter M, MD as Consulting Physician (Cardiology) Cleotilde Ronal RAMAN, MD as Consulting Physician (Gynecology)  Extended Emergency Contact Information Primary Emergency Contact: Jama Mom [facility] [place] Mobile Phone: [phone] Relation: Niece Secondary Emergency Contact: Joan Arrant Mobile Phone: [phone] Relation: Other Preferred language: Isadora Interpreter needed? No Guardian: Jama Mom Mobile Phone: [phone] Relation: Legal Guardian Preferred language: English Interpreter needed? No  Code Status:  DNR Goals of care: Advanced Directive information    01/22/2024    2:22 PM  Advanced Directives  Does Patient Have a Medical Advance Directive? Yes  Type of Estate agent of [place];Living will;Out of facility DNR (pink MOST or yellow form)  Does patient want to make changes to medical advance directive? No - Patient declined  Copy of Healthcare Power of Attorney in Chart? Yes - validated most recent copy scanned in chart (See row information)     Chief Complaint  Patient presents with   Acute Visit    Follow up on labs    HPI:  Pt is a 89 y.o. female with past medical history of diabetes, hypertension, CKD, CHF, cognitive impairment,  hyperlipidemia is seen today for an acute visit for follow-up on recent labs. Patient seen and examined in her room.  She seems pleasant and comfortable and does not appear to be in distress. Diabetes A1c increased to 11 from 8.8. She was previously on metformin  but has been stopped in the past. Patient denies eating sweets.  CKD Creatinine increased to 1.67 Patient reports that she does not drink a lot of water Denies being dizzy, lightheaded.   Osteoarthritis Patient complains of multiple joint pains. She ambulates with a walker. No recent falls.   Patient denies chest pain, shortness of breath, abdominal pain, nausea, vomiting, dysuria, hematuria, bloody or dark-colored stools   Past Medical History:  Diagnosis Date   Contact dermatitis and other eczema, due to unspecified cause    Cortical senile cataract    GERD (gastroesophageal reflux disease) 02/17/2013   Hyperparathyroidism, unspecified (HCC) 03/26/2010   Irritable bowel syndrome    Keratoconjunctivitis sicca, not specified as Sjogren's    Loss of weight    Macular degeneration (senile) of retina, unspecified    Memory loss    Other and unspecified hyperlipidemia    Other malaise and fatigue 06/17/2010   Pain in joint, site unspecified    Pathologic fracture of vertebrae    Reflux esophagitis    Scoliosis (and kyphoscoliosis), idiopathic    Senile osteoporosis    Type II or unspecified type diabetes mellitus without mention of complication, uncontrolled    Unspecified essential hypertension    Unspecified hereditary and idiopathic peripheral neuropathy    Unspecified vitamin D deficiency    Past Surgical History:  Procedure Laterality Date   APPENDECTOMY     BREAST LUMPECTOMY  02/05/2010   Dr. Charlena Cornett   CATARACT EXTRACTION W/ INTRAOCULAR LENS  IMPLANT, BILATERAL  04/2013   Dr. Camillo   CHOLECYSTECTOMY  2004   Dr. Lily   COLONOSCOPY  2001   Dr. Kristie   fibroidectomy  1950   HEMORRHOID SURGERY  1978    KYPHOPLASTY  2010   T10 & T12 Dr. Fairy Levels   MEDIAL PARTIAL KNEE REPLACEMENT Left 2004   Dr. Rubie   SPINE SURGERY     vertebroplasty T10, T12   SPINE SURGERY  11/02/2001    C4-5 & C5-6 Titanium Plate Placed Dr. Gaither   THYROIDECTOMY, PARTIAL Left 2007   TOTAL KNEE ARTHROPLASTY Right 2009   Dr. Ernie     Allergies  Allergen Reactions   Bactrim     unknown   Betadine [Povidone Iodine]     unknown   Metformin  And Related     Hurt stomach   Nutmeg Oil (Myristica Oil)     unknown   Prednisone     Sulfa Antibiotics     unknown    Allergies as of 01/22/2024       Reactions   Bactrim    unknown   Betadine [povidone Iodine]    unknown   Metformin  And Related    Hurt stomach   Nutmeg Oil (myristica Oil)    unknown   Prednisone     Sulfa Antibiotics    unknown        Medication List        Accurate as of January 22, 2024  2:32 PM. If you have any questions, ask your nurse or doctor.          acetaminophen  325 MG tablet Commonly known as: TYLENOL  Take 650 mg by mouth every 6 (six) hours as needed for mild pain. Three times a day   acetaminophen  325 MG tablet Commonly known as: TYLENOL  Take 650 mg by mouth every other day.   amLODipine  5 MG tablet Commonly known as: NORVASC  Take 0.5 tablets (2.5 mg total) by mouth daily.   aspirin  EC 81 MG tablet Take 1 tablet (81 mg total) by mouth daily.   atorvastatin  10 MG tablet Commonly known as: LIPITOR Take 10 mg by mouth daily.   betamethasone  dipropionate 0.05 % ointment Commonly known as: DIPROLENE  Apply 1 Application topically as needed.   diclofenac Sodium 1 % Gel Commonly known as: VOLTAREN Apply 2 g topically daily. Apply to both knees   ferrous sulfate 325 (65 FE) MG tablet Take 325 mg by mouth 3 (three) times a week. Monday, Wednesday, and Friday.   furosemide 20 MG tablet Commonly known as: LASIX Take 20 mg by mouth. Once A Day on Mon, Wed, Fri   losartan  25 MG tablet Commonly known as:  COZAAR  Take 25 mg by mouth every morning.   metoprolol  tartrate 25 MG tablet Commonly known as: LOPRESSOR  Take 12.5 mg by mouth 2 (two) times daily. Hold Metoprolol  for SBP < 110 and DSP <50   mirtazapine 7.5 MG tablet Commonly known as: REMERON Take 7.5 mg by mouth at bedtime. 1/2 tablet   nitroGLYCERIN  0.4 MG SL tablet Commonly known as: NITROSTAT  Place 0.4 mg under the tongue every 5 (five) minutes as needed for chest pain. PLACE 0.4 MG UNDER TONGUE EVERY 5 MINUTES X3 FOR CHEST PAIN, IF NO RELIEF, CALL MD OR SEND TO ED FOR FURTHER EVALUATION.   pantoprazole 40 MG tablet Commonly known as: PROTONIX Take 40 mg by mouth daily.   polyethylene glycol 17 g packet Commonly known as: MIRALAX / GLYCOLAX Take 17 g  by mouth daily as needed.   potassium chloride 10 MEQ CR capsule Commonly known as: MICRO-K Take 10 mEq by mouth 3 (three) times a week. Monday, Wednesday, and Friday.        Review of Systems  Constitutional:  Negative for chills and fever.  Respiratory:  Negative for cough, shortness of breath and wheezing.   Cardiovascular:  Negative for chest pain, palpitations and leg swelling.  Gastrointestinal:  Negative for abdominal distention, abdominal pain, blood in stool, constipation, diarrhea and nausea.  Genitourinary:  Negative for dysuria, frequency and urgency.  Musculoskeletal:  Positive for arthralgias.  Neurological:  Negative for dizziness.    Immunization History  Administered Date(s) Administered   Fluad Quad(high Dose 65+) 04/01/2023   Influenza Whole 03/30/2012, 04/13/2013, 04/04/2018   Influenza, High Dose Seasonal PF 04/02/2019   Influenza-Unspecified 04/17/2014, 03/29/2015, 04/18/2021, 04/21/2022   Moderna SARS-COV2 Booster Vaccination 05/08/2020, 04/15/2023   Moderna Sars-Covid-2 Vaccination 07/02/2019, 07/30/2019, 11/27/2020   PFIZER Comirnaty(Gray Top)Covid-19 Tri-Sucrose Vaccine 03/19/2021   Pfizer Covid-19 Vaccine Bivalent Booster 109yrs & up  05/01/2022   Pneumococcal Conjugate-13 12/08/2017   Pneumococcal Polysaccharide-23 06/30/2001   Td 06/30/2001, 12/08/2017   Unspecified SARS-COV-2 Vaccination 05/01/2022   Pertinent  Health Maintenance Due  Topic Date Due   OPHTHALMOLOGY EXAM  08/29/2022   FOOT EXAM  10/23/2022   INFLUENZA VACCINE  01/29/2024   HEMOGLOBIN A1C  07/23/2024   DEXA SCAN  Completed      11/12/2020    7:45 AM 11/13/2020    2:00 AM 11/13/2020   10:00 AM 11/05/2021    7:37 PM 06/17/2022    9:10 AM  Fall Risk  Falls in the past year?     0  Was there an injury with Fall?     0  Fall Risk Category Calculator     0  Fall Risk Category (Retired)     Low   (RETIRED) Patient Fall Risk Level Moderate fall risk  Moderate fall risk  Moderate fall risk  High fall risk  Low fall risk   Patient at Risk for Falls Due to     No Fall Risks  Fall risk Follow up     Falls evaluation completed      Data saved with a previous flowsheet row definition   Functional Status Survey:    Vitals:   01/22/24 1419  BP: 130/60  Pulse: 67  Resp: 16  Temp: 97.7 F (36.5 C)  SpO2: 95%  Weight: 106 lb (48.1 kg)  Height: 5' 2 (1.575 m)   Body mass index is 19.39 kg/m. Physical Exam Constitutional:      Appearance: Normal appearance.  HENT:     Head: Normocephalic and atraumatic.  Cardiovascular:     Rate and Rhythm: Normal rate and regular rhythm.  Pulmonary:     Effort: Pulmonary effort is normal. No respiratory distress.     Breath sounds: Normal breath sounds. No wheezing.  Abdominal:     General: Bowel sounds are normal. There is no distension.     Tenderness: There is no abdominal tenderness. There is no guarding or rebound.     Comments:    Musculoskeletal:        General: No swelling or tenderness.  Neurological:     Mental Status: She is alert. Mental status is at baseline.     Motor: No weakness.     Labs reviewed: Recent Labs    10/30/23 0000 01/21/24 2352  NA 139 137  K 4.9  4.5  CL 104 103   CO2 28* 29*  BUN 22* 33*  CREATININE 1.4*  --   CALCIUM  9.8  --    Recent Labs    01/21/24 2352  AST 11*  ALT 9   Recent Labs    10/30/23 0000 11/19/23 0000 01/21/24 2352  WBC 7.5 8.9 7.8  NEUTROABS 3,705.00 4,495.00 3,674.00  HGB 8.6* 9.9* 10.0*  HCT 27* 31* 31*  PLT 301 286 244   Lab Results  Component Value Date   TSH 0.38 (A) 01/21/2024   Lab Results  Component Value Date   HGBA1C 11.1 01/21/2024   Lab Results  Component Value Date   CHOL 98 01/21/2024   HDL 59 12/24/2022   LDLCALC 44 01/21/2024   TRIG 55 01/21/2024   CHOLHDL 2.4 06/27/2018    Significant Diagnostic Results in last 30 days:  No results found.  Assessment/Plan  DM Lab Results  Component Value Date   HGBA1C 11.1 01/21/2024   HGBA1C 8.2 10/30/2023   HGBA1C 6.9 12/24/2022  A1c increased to 11.1 from 8.2 Will start Januvia 25 mg Will order blood sugars daily Avoid high carbohydrate foods Will follow-up A1c in 3 months   CKD Creatinine trended to 1.67 Avoid nephrotoxic medications Increase oral hydration   Abnormal TSH Lab Results  Component Value Date   TSH 0.38 (A) 01/21/2024  Will repeat TSH in 6 weeks  Osteoarthritis Continue Tylenol  Continue to use walker at all times.

## 2024-01-27 ENCOUNTER — Non-Acute Institutional Stay: Payer: Self-pay | Admitting: Adult Health

## 2024-01-27 ENCOUNTER — Encounter: Payer: Self-pay | Admitting: Adult Health

## 2024-01-27 DIAGNOSIS — M25551 Pain in right hip: Secondary | ICD-10-CM

## 2024-01-27 DIAGNOSIS — I10 Essential (primary) hypertension: Secondary | ICD-10-CM | POA: Diagnosis not present

## 2024-01-27 DIAGNOSIS — E1122 Type 2 diabetes mellitus with diabetic chronic kidney disease: Secondary | ICD-10-CM | POA: Diagnosis not present

## 2024-01-27 DIAGNOSIS — N1832 Chronic kidney disease, stage 3b: Secondary | ICD-10-CM

## 2024-01-27 DIAGNOSIS — I5032 Chronic diastolic (congestive) heart failure: Secondary | ICD-10-CM | POA: Diagnosis not present

## 2024-01-27 NOTE — Progress Notes (Signed)
 Location:  [facility] Room Number: [id number] Place of Service:  ALF [phone]) Provider:  Jereld JAYSON Berneda Estelle, NP    Patient Care Team: Mast, Man X, NP as PCP - General (Internal Medicine) [place] Mast, Man X, NP as Nurse Practitioner (Nurse Practitioner) Tommas Pears, MD as Consulting Physician (Endocrinology) Camillo Golas, MD as Consulting Physician (Ophthalmology) Rubie Kemps, MD as Consulting Physician (Orthopedic Surgery) Bonner Ade, MD as Consulting Physician (Physical Medicine and Rehabilitation) Unice Pac, MD as Consulting Physician (Neurosurgery) Kristie Lamprey, MD as Consulting Physician (Gastroenterology) [place], Peter M, MD as Consulting Physician (Cardiology) Cleotilde Ronal RAMAN, MD as Consulting Physician (Gynecology)  Extended Emergency Contact Information Primary Emergency Contact: Jama Mom [facility] [place] Mobile Phone: [phone] Relation: Niece Secondary Emergency Contact: Joan Arrant Mobile Phone: [phone] Relation: Other Preferred language: Isadora Interpreter needed? No Guardian: Jama Mom Mobile Phone: [phone] Relation: Legal Guardian Preferred language: English Interpreter needed? No  Code Status:  DNR Goals of care: Advanced Directive information    01/22/2024    2:22 PM  Advanced Directives  Does Patient Have a Medical Advance Directive? Yes  Type of Estate agent of [place];Living will;Out of facility DNR (pink MOST or yellow form)  Does patient want to make changes to medical advance directive? No - Patient declined  Copy of Healthcare Power of Attorney in Chart? Yes - validated most recent copy scanned in chart (See row information)     Chief Complaint  Patient presents with   Acute Visit    Hip pain    HPI:  Pt is a 89 y.o. female seen today for an acute visit for hip pain. She is a resident of [facility]. She was seen in  her room with sitter. She complains of pain on her right hip. She has is being given PRN Acetaminophen  every morning. Niece noted that once she gets up, she  gets better. Niece is requesting for x-ray.   She takes Furosemide 20 mg daily on MWF for CHF. No reported SOB.   She was recently started on Januvia 25 mg daily for diabetes mellitus due to A1C 11.1, 01/22/24.  BP 135/58, takes losartan  25 mg daily, amlodipine  2.5 mg daily and metoprolol  tartrate 12.5 mg twice a day for hypertension.   Past Medical History:  Diagnosis Date   Contact dermatitis and other eczema, due to unspecified cause    Cortical senile cataract    GERD (gastroesophageal reflux disease) 02/17/2013   Hyperparathyroidism, unspecified (HCC) 03/26/2010   Irritable bowel syndrome    Keratoconjunctivitis sicca, not specified as Sjogren's    Loss of weight    Macular degeneration (senile) of retina, unspecified    Memory loss    Other and unspecified hyperlipidemia    Other malaise and fatigue 06/17/2010   Pain in joint, site unspecified    Pathologic fracture of vertebrae    Reflux esophagitis    Scoliosis (and kyphoscoliosis), idiopathic    Senile osteoporosis    Type II or unspecified type diabetes mellitus without mention of complication, uncontrolled    Unspecified essential hypertension    Unspecified hereditary and idiopathic peripheral neuropathy    Unspecified vitamin D deficiency    Past Surgical History:  Procedure Laterality Date   APPENDECTOMY     BREAST LUMPECTOMY  02/05/2010   Dr. Charlena Cornett   CATARACT EXTRACTION W/ INTRAOCULAR LENS  IMPLANT, BILATERAL  04/2013   Dr. Camillo   CHOLECYSTECTOMY  2004   Dr. Lily   COLONOSCOPY  2001   Dr. Kristie   fibroidectomy  1950   HEMORRHOID SURGERY  1978   KYPHOPLASTY  2010   T10 & T12 Dr. Fairy Levels   MEDIAL PARTIAL KNEE REPLACEMENT Left 2004   Dr. Rubie   SPINE SURGERY     vertebroplasty T10, T12   SPINE SURGERY  11/02/2001    C4-5 & C5-6 Titanium  Plate Placed Dr. Gaither   THYROIDECTOMY, PARTIAL Left 2007   TOTAL KNEE ARTHROPLASTY Right 2009   Dr. Ernie     Allergies  Allergen Reactions   Bactrim     unknown   Betadine [Povidone Iodine]     unknown   Metformin  And Related     Hurt stomach   Nutmeg Oil (Myristica Oil)     unknown   Prednisone     Sulfa Antibiotics     unknown    Outpatient Encounter Medications as of 01/27/2024  Medication Sig   acetaminophen  (TYLENOL ) 325 MG tablet Take 650 mg by mouth every 6 (six) hours as needed for mild pain. Three times a day   acetaminophen  (TYLENOL ) 325 MG tablet Take 650 mg by mouth every other day.   amLODipine  (NORVASC ) 5 MG tablet Take 0.5 tablets (2.5 mg total) by mouth daily.   aspirin  EC 81 MG EC tablet Take 1 tablet (81 mg total) by mouth daily.   atorvastatin  (LIPITOR) 10 MG tablet Take 10 mg by mouth daily.   betamethasone  dipropionate (DIPROLENE ) 0.05 % ointment Apply 1 Application topically as needed.   diclofenac Sodium (VOLTAREN) 1 % GEL Apply 2 g topically daily. Apply to both knees   ferrous sulfate 325 (65 FE) MG tablet Take 325 mg by mouth 3 (three) times a week. Monday, Wednesday, and Friday.   furosemide (LASIX) 20 MG tablet Take 20 mg by mouth. Once A Day on Mon, Wed, Fri   losartan  (COZAAR ) 25 MG tablet Take 25 mg by mouth every morning.   metoprolol  tartrate (LOPRESSOR ) 25 MG tablet Take 12.5 mg by mouth 2 (two) times daily. Hold Metoprolol  for SBP < 110 and DSP <50   mirtazapine (REMERON) 7.5 MG tablet Take 7.5 mg by mouth at bedtime. 1/2 tablet   nitroGLYCERIN  (NITROSTAT ) 0.4 MG SL tablet Place 0.4 mg under the tongue every 5 (five) minutes as needed for chest pain. PLACE 0.4 MG UNDER TONGUE EVERY 5 MINUTES X3 FOR CHEST PAIN, IF NO RELIEF, CALL MD OR SEND TO ED FOR FURTHER EVALUATION.   pantoprazole (PROTONIX) 40 MG tablet Take 40 mg by mouth daily.   polyethylene glycol (MIRALAX / GLYCOLAX) 17 g packet Take 17 g by mouth daily as needed.   potassium chloride  (MICRO-K) 10 MEQ CR capsule Take 10 mEq by mouth 3 (three) times a week. Monday, Wednesday, and Friday.   sitaGLIPtin (JANUVIA) 25 MG tablet Take 25 mg by mouth daily.   No facility-administered encounter medications on file as of 01/27/2024.    Review of Systems  Constitutional:  Negative for appetite change, chills, fatigue and fever.  HENT:  Negative for congestion, hearing loss, rhinorrhea and sore throat.   Eyes: Negative.   Respiratory:  Negative for cough, shortness of breath and wheezing.   Cardiovascular:  Negative for chest pain, palpitations and leg swelling.  Gastrointestinal:  Negative for abdominal pain, constipation, diarrhea, nausea and vomiting.  Genitourinary:  Negative for dysuria.  Musculoskeletal:  Negative for arthralgias, back pain and myalgias.  Right hip pain  Skin:  Negative for color change, rash and wound.  Neurological:  Negative for dizziness, weakness and headaches.  Psychiatric/Behavioral:  Negative for behavioral problems. The patient is not nervous/anxious.     Immunization History  Administered Date(s) Administered   Fluad Quad(high Dose 65+) 04/01/2023   Influenza Whole 03/30/2012, 04/13/2013, 04/04/2018   Influenza, High Dose Seasonal PF 04/02/2019   Influenza-Unspecified 04/17/2014, 03/29/2015, 04/18/2021, 04/21/2022   Moderna SARS-COV2 Booster Vaccination 05/08/2020, 04/15/2023   Moderna Sars-Covid-2 Vaccination 07/02/2019, 07/30/2019, 11/27/2020   PFIZER Comirnaty(Gray Top)Covid-19 Tri-Sucrose Vaccine 03/19/2021   Pfizer Covid-19 Vaccine Bivalent Booster 51yrs & up 05/01/2022   Pneumococcal Conjugate-13 12/08/2017   Pneumococcal Polysaccharide-23 06/30/2001   Td 06/30/2001, 12/08/2017   Unspecified SARS-COV-2 Vaccination 05/01/2022   Pertinent  Health Maintenance Due  Topic Date Due   OPHTHALMOLOGY EXAM  08/29/2022   FOOT EXAM  10/23/2022   INFLUENZA VACCINE  01/29/2024   HEMOGLOBIN A1C  07/23/2024   DEXA SCAN  Completed       11/12/2020    7:45 AM 11/13/2020    2:00 AM 11/13/2020   10:00 AM 11/05/2021    7:37 PM 06/17/2022    9:10 AM  Fall Risk  Falls in the past year?     0  Was there an injury with Fall?     0  Fall Risk Category Calculator     0  Fall Risk Category (Retired)     Low   (RETIRED) Patient Fall Risk Level Moderate fall risk  Moderate fall risk  Moderate fall risk  High fall risk  Low fall risk   Patient at Risk for Falls Due to     No Fall Risks  Fall risk Follow up     Falls evaluation completed      Data saved with a previous flowsheet row definition   Functional Status Survey:    Vitals:   01/27/24 1011  BP: (!) 140/60  Pulse: 67  Resp: 16  Temp: 97.7 F (36.5 C)  SpO2: 95%  Weight: 106 lb (48.1 kg)  Height: 5' 2 (1.575 m)   Body mass index is 19.39 kg/m. Physical Exam Constitutional:      Appearance: Normal appearance.  HENT:     Head: Normocephalic and atraumatic.     Nose: Nose normal.     Mouth/Throat:     Mouth: Mucous membranes are moist.  Eyes:     Conjunctiva/sclera: Conjunctivae normal.  Cardiovascular:     Rate and Rhythm: Normal rate and regular rhythm.  Pulmonary:     Effort: Pulmonary effort is normal.     Breath sounds: Normal breath sounds.  Abdominal:     General: Bowel sounds are normal.     Palpations: Abdomen is soft.  Musculoskeletal:        General: Normal range of motion.     Cervical back: Normal range of motion.     Right lower leg: Edema present.     Left lower leg: Edema present.     Comments: Trace edema BLE  Skin:    General: Skin is warm and dry.  Neurological:     Mental Status: She is alert.  Psychiatric:        Mood and Affect: Mood normal.        Behavior: Behavior normal.     Labs reviewed: Recent Labs    10/30/23 0000 01/21/24 2352  NA 139 137  K 4.9 4.5  CL 104 103  CO2 28*  29*  BUN 22* 33*  CREATININE 1.4*  --   CALCIUM  9.8  --    Recent Labs    01/21/24 2352  AST 11*  ALT 9   Recent Labs     10/30/23 0000 11/19/23 0000 01/21/24 2352  WBC 7.5 8.9 7.8  NEUTROABS 3,705.00 4,495.00 3,674.00  HGB 8.6* 9.9* 10.0*  HCT 27* 31* 31*  PLT 301 286 244   Lab Results  Component Value Date   TSH 0.38 (A) 01/21/2024   Lab Results  Component Value Date   HGBA1C 11.1 01/21/2024   Lab Results  Component Value Date   CHOL 98 01/21/2024   HDL 59 12/24/2022   LDLCALC 44 01/21/2024   TRIG 55 01/21/2024   CHOLHDL 2.4 06/27/2018    Significant Diagnostic Results in last 30 days:  No results found.  Assessment/Plan  1. Right hip pain (Primary) - Complaints of right hip pain in the morning daily, gets better once she is up and about - Will start acetaminophen  225 mg give 2 tablets = 650 mg daily -   X-ray of right hip, 2 views -   Fall precautions  2. Chronic diastolic congestive heart failure (HCC) -No shortness of breath -   Continue furosemide 20 mg daily on MWF -   sitter  stated that her cardiologist has moved to another practice and needing a new referral -   Cardiology referral ordered  3. Type 2 diabetes mellitus with stage 3b chronic kidney disease, without long-term current use of insulin  (HCC) Lab Results  Component Value Date   HGBA1C 11.1 01/21/2024    -   Continue Januvia 25 mg daily  4. Essential hypertension -   BP stable -    Continue losartan  25 mg daily -   Continue metoprolol  tartrate 12.5 mg twice a day -   Continue amlodipine  2.5 mg daily     Family/ staff Communication: Discussed plan of care with resident, sister and charge nurse.  Labs/tests ordered: X-ray of right hip, 2 views

## 2024-03-23 ENCOUNTER — Ambulatory Visit: Admitting: Internal Medicine

## 2024-03-24 ENCOUNTER — Ambulatory Visit: Admitting: Internal Medicine

## 2024-03-24 ENCOUNTER — Ambulatory Visit: Admitting: Cardiology

## 2024-03-28 ENCOUNTER — Encounter: Payer: Self-pay | Admitting: Sports Medicine

## 2024-03-28 ENCOUNTER — Non-Acute Institutional Stay: Payer: Self-pay | Admitting: Sports Medicine

## 2024-03-28 DIAGNOSIS — I5032 Chronic diastolic (congestive) heart failure: Secondary | ICD-10-CM

## 2024-03-28 DIAGNOSIS — E0821 Diabetes mellitus due to underlying condition with diabetic nephropathy: Secondary | ICD-10-CM

## 2024-03-28 DIAGNOSIS — I10 Essential (primary) hypertension: Secondary | ICD-10-CM | POA: Diagnosis not present

## 2024-03-28 NOTE — Progress Notes (Signed)
 Provider:  Dr. Jackalyn Blazing Location:  [facility] [place] Place of Service:   Assisted living   PCP: Mast, Man X, NP Patient Care Team: Mast, Man X, NP as PCP - General (Internal Medicine) [place] Mast, Man X, NP as Nurse Practitioner (Nurse Practitioner) Tommas Pears, MD as Consulting Physician (Endocrinology) Camillo Golas, MD as Consulting Physician (Ophthalmology) Rubie Kemps, MD as Consulting Physician (Orthopedic Surgery) Bonner Ade, MD as Consulting Physician (Physical Medicine and Rehabilitation) Unice Pac, MD as Consulting Physician (Neurosurgery) Kristie Lamprey, MD as Consulting Physician (Gastroenterology) [place], Peter M, MD as Consulting Physician (Cardiology) Cleotilde Ronal RAMAN, MD as Consulting Physician (Gynecology)  Extended Emergency Contact Information Primary Emergency Contact: Jama Mom [facility] [place] Mobile Phone: [phone] Relation: Niece Secondary Emergency Contact: Joan Arrant Mobile Phone: [phone] Relation: Other Preferred language: Isadora Interpreter needed? No Guardian: Jama Mom Mobile Phone: [phone] Relation: Legal Guardian Preferred language: English Interpreter needed? No  Goals of Care: Advanced Directive information    01/22/2024    2:22 PM  Advanced Directives  Does Patient Have a Medical Advance Directive? Yes  Type of Estate agent of [place];Living will;Out of facility DNR (pink MOST or yellow form)  Does patient want to make changes to medical advance directive? No - Patient declined  Copy of Healthcare Power of Attorney in Chart? Yes - validated most recent copy scanned in chart (See row information)        History of Present Illness  89 year old female with a past medical history of diabetes, hypertension, CAD shift, CKD, hyperlipidemia is seen today for an acute visit for high blood pressure readings Patient seen and examined in her room.   She seems pleasant and comfortable and does not appear to be in distress. She ambulates with a walker. Patient denies headache, nausea, vomiting, blurry or double vision, chest pain, shortness of breath, palpitations, abdominal pain, dysuria, hematuria, bloody or dark-colored stools. Patient states she is very active. Reviewed her blood pressure log.  She has occasional few readings in 190s. Patient states she does not have a lot of choice regarding her dietary options.    Past Medical History:  Diagnosis Date   Contact dermatitis and other eczema, due to unspecified cause    Cortical senile cataract    GERD (gastroesophageal reflux disease) 02/17/2013   Hyperparathyroidism, unspecified 03/26/2010   Irritable bowel syndrome    Keratoconjunctivitis sicca, not specified as Sjogren's    Loss of weight    Macular degeneration (senile) of retina, unspecified    Memory loss    Other and unspecified hyperlipidemia    Other malaise and fatigue 06/17/2010   Pain in joint, site unspecified    Pathologic fracture of vertebrae    Reflux esophagitis    Scoliosis (and kyphoscoliosis), idiopathic    Senile osteoporosis    Type II or unspecified type diabetes mellitus without mention of complication, uncontrolled    Unspecified essential hypertension    Unspecified hereditary and idiopathic peripheral neuropathy    Unspecified vitamin D deficiency    Past Surgical History:  Procedure Laterality Date   APPENDECTOMY     BREAST LUMPECTOMY  02/05/2010   Dr. Charlena Cornett   CATARACT EXTRACTION W/ INTRAOCULAR LENS  IMPLANT, BILATERAL  04/2013   Dr. Camillo   CHOLECYSTECTOMY  2004   Dr. Lily   COLONOSCOPY  2001   Dr. Kristie   fibroidectomy  1950   HEMORRHOID SURGERY  1978   KYPHOPLASTY  2010   T10 &  T12 Dr. Fairy Levels   MEDIAL PARTIAL KNEE REPLACEMENT Left 2004   Dr. Rubie   SPINE SURGERY     vertebroplasty T10, T12   SPINE SURGERY  11/02/2001    C4-5 & C5-6 Titanium Plate Placed Dr. Gaither    THYROIDECTOMY, PARTIAL Left 2007   TOTAL KNEE ARTHROPLASTY Right 2009   Dr. Ernie     reports that she has never smoked. She has never used smokeless tobacco. She reports that she does not drink alcohol  and does not use drugs. Social History   Socioeconomic History   Marital status: Widowed    Spouse name: Not on file   Number of children: 0   Years of education: Not on file   Highest education level: Not on file  Occupational History   Occupation: retired Insurance underwriter: RETIRED  Tobacco Use   Smoking status: Never   Smokeless tobacco: Never  Vaping Use   Vaping status: Never Used  Substance and Sexual Activity   Alcohol  use: No   Drug use: No   Sexual activity: Never    Partners: Male    Birth control/protection: Post-menopausal  Other Topics Concern   Not on file  Social History Narrative   Lives at [facility] [place] since 02/06/2009   Widowed    No childred   Living Will   Social Drivers of Health   Financial Resource Strain: Low Risk  (11/24/2017)   Overall Financial Resource Strain (CARDIA)    Difficulty of Paying Living Expenses: Not hard at all  Food Insecurity: No Food Insecurity (11/24/2017)   Hunger Vital Sign    Worried About Running Out of Food in the Last Year: Never true    Ran Out of Food in the Last Year: Never true  Transportation Needs: No Transportation Needs (11/24/2017)   PRAPARE - Administrator, Civil Service (Medical): No    Lack of Transportation (Non-Medical): No  Physical Activity: Insufficiently Active (11/24/2017)   Exercise Vital Sign    Days of Exercise per Week: 7 days    Minutes of Exercise per Session: 20 min  Stress: No Stress Concern Present (11/24/2017)   [employer] of Occupational Health - Occupational Stress Questionnaire    Feeling of Stress : Only a little  Social Connections: Moderately Isolated (11/24/2017)   Social Connection and Isolation Panel    Frequency of Communication  with Friends and Family: More than three times a week    Frequency of Social Gatherings with Friends and Family: More than three times a week    Attends Religious Services: Never    Database administrator or Organizations: No    Attends Banker Meetings: Never    Marital Status: Widowed  Intimate Partner Violence: Not At Risk (11/24/2017)   Humiliation, Afraid, Rape, and Kick questionnaire    Fear of Current or Ex-Partner: No    Emotionally Abused: No    Physically Abused: No    Sexually Abused: No    Functional Status Survey:    Family History  Problem Relation Age of Onset   Diabetes Mother    Heart disease Mother        CHF   Stroke Father    Heart disease Sister        MI   Colon cancer Neg Hx    Stomach cancer Neg Hx    Esophageal cancer Neg Hx    Rectal cancer Neg Hx  Liver cancer Neg Hx     Health Maintenance  Topic Date Due   OPHTHALMOLOGY EXAM  08/29/2022   FOOT EXAM  10/23/2022   Influenza Vaccine  01/29/2024   COVID-19 Vaccine (8 - 2025-26 season) 02/29/2024   Medicare Annual Wellness (AWV)  06/21/2024   HEMOGLOBIN A1C  07/23/2024   DTaP/Tdap/Td (3 - Tdap) 12/09/2027   Pneumococcal Vaccine: 50+ Years  Completed   DEXA SCAN  Completed   HPV VACCINES  Aged Out   Meningococcal B Vaccine  Aged Out   Zoster Vaccines- Shingrix  Discontinued    Allergies  Allergen Reactions   Bactrim     unknown   Betadine [Povidone Iodine]     unknown   Metformin  And Related     Hurt stomach   Nutmeg Oil (Myristica Oil)     unknown   Prednisone     Sulfa Antibiotics     unknown    Outpatient Encounter Medications as of 03/28/2024  Medication Sig   acetaminophen  (TYLENOL ) 325 MG tablet Take 650 mg by mouth every 6 (six) hours as needed for mild pain. Three times a day   acetaminophen  (TYLENOL ) 325 MG tablet Take 650 mg by mouth every other day.   amLODipine  (NORVASC ) 5 MG tablet Take 0.5 tablets (2.5 mg total) by mouth daily.   aspirin  EC 81 MG EC  tablet Take 1 tablet (81 mg total) by mouth daily.   atorvastatin  (LIPITOR) 10 MG tablet Take 10 mg by mouth daily.   betamethasone  dipropionate (DIPROLENE ) 0.05 % ointment Apply 1 Application topically as needed.   diclofenac Sodium (VOLTAREN) 1 % GEL Apply 2 g topically daily. Apply to both knees   ferrous sulfate 325 (65 FE) MG tablet Take 325 mg by mouth 3 (three) times a week. Monday, Wednesday, and Friday.   furosemide (LASIX) 20 MG tablet Take 20 mg by mouth. Once A Day on Mon, Wed, Fri   losartan  (COZAAR ) 25 MG tablet Take 25 mg by mouth every morning.   metoprolol  tartrate (LOPRESSOR ) 25 MG tablet Take 12.5 mg by mouth 2 (two) times daily. Hold Metoprolol  for SBP < 110 and DSP <50   mirtazapine (REMERON) 7.5 MG tablet Take 7.5 mg by mouth at bedtime. 1/2 tablet   nitroGLYCERIN  (NITROSTAT ) 0.4 MG SL tablet Place 0.4 mg under the tongue every 5 (five) minutes as needed for chest pain. PLACE 0.4 MG UNDER TONGUE EVERY 5 MINUTES X3 FOR CHEST PAIN, IF NO RELIEF, CALL MD OR SEND TO ED FOR FURTHER EVALUATION.   pantoprazole (PROTONIX) 40 MG tablet Take 40 mg by mouth daily.   polyethylene glycol (MIRALAX / GLYCOLAX) 17 g packet Take 17 g by mouth daily as needed.   potassium chloride (MICRO-K) 10 MEQ CR capsule Take 10 mEq by mouth 3 (three) times a week. Monday, Wednesday, and Friday.   sitaGLIPtin (JANUVIA) 25 MG tablet Take 25 mg by mouth daily.   No facility-administered encounter medications on file as of 03/28/2024.    Review of Systems  Constitutional:  Negative for chills and fever.  Respiratory:  Negative for cough, shortness of breath and wheezing.   Cardiovascular:  Negative for chest pain, palpitations and leg swelling.  Gastrointestinal:  Negative for abdominal distention, abdominal pain, blood in stool, constipation, diarrhea, nausea and vomiting.  Genitourinary:  Negative for dysuria, frequency and urgency.  Neurological:  Negative for dizziness.   Negative unless indicated  in HPI.  There were no vitals filed for this visit. There is no  height or weight on file to calculate BMI. BP Readings from Last 3 Encounters:  01/27/24 (!) 135/58  01/22/24 130/60  01/19/24 (!) 146/71   Wt Readings from Last 3 Encounters:  01/27/24 106 lb (48.1 kg)  01/22/24 106 lb (48.1 kg)  01/19/24 106 lb (48.1 kg)   Physical Exam Constitutional:      Appearance: Normal appearance.  HENT:     Head: Normocephalic and atraumatic.  Cardiovascular:     Rate and Rhythm: Normal rate and regular rhythm.  Pulmonary:     Effort: Pulmonary effort is normal. No respiratory distress.     Breath sounds: Normal breath sounds. No wheezing.  Abdominal:     General: Bowel sounds are normal. There is no distension.     Tenderness: There is no abdominal tenderness. There is no guarding or rebound.     Comments:    Musculoskeletal:        General: No swelling.  Neurological:     Mental Status: She is alert. Mental status is at baseline.     Motor: No weakness.     Labs reviewed: Basic Metabolic Panel: Recent Labs    10/30/23 0000 01/21/24 2352  NA 139 137  K 4.9 4.5  CL 104 103  CO2 28* 29*  BUN 22* 33*  CREATININE 1.4*  --   CALCIUM  9.8  --    Liver Function Tests: Recent Labs    01/21/24 2352  AST 11*  ALT 9   No results for input(s): LIPASE, AMYLASE in the last 8760 hours. No results for input(s): AMMONIA in the last 8760 hours. CBC: Recent Labs    10/30/23 0000 11/19/23 0000 01/21/24 2352  WBC 7.5 8.9 7.8  NEUTROABS 3,705.00 4,495.00 3,674.00  HGB 8.6* 9.9* 10.0*  HCT 27* 31* 31*  PLT 301 286 244   Cardiac Enzymes: No results for input(s): CKTOTAL, CKMB, CKMBINDEX, TROPONINI in the last 8760 hours. BNP: Invalid input(s): POCBNP Lab Results  Component Value Date   HGBA1C 11.1 01/21/2024   Lab Results  Component Value Date   TSH 0.38 (A) 01/21/2024   Lab Results  Component Value Date   VITAMINB12 601 12/24/2022   Lab Results   Component Value Date   FOLATE 44.1 11/13/2020   Lab Results  Component Value Date   IRON 70 11/13/2020   TIBC 307 11/13/2020   FERRITIN 22 11/13/2020    Imaging and Procedures obtained prior to SNF admission: DG Chest 1 View Result Date: 11/05/2021 CLINICAL DATA:  Fall, left arm pain EXAM: CHEST  1 VIEW COMPARISON:  None Available. FINDINGS: Lungs are clear. No pneumothorax or pleural effusion. Cardiac size within normal limits. Pulmonary vascularity is normal. Acute three-part fracture of the left humeral head is seen with fractures involving the greater tuberosity and surgical neck of the humerus. T10 and T12 vertebroplasty has been performed. IMPRESSION: Acute three-part fracture of the left humeral head. No radiographic evidence of acute cardiopulmonary disease. Electronically Signed   By: Dorethia Molt M.D.   On: 11/05/2021 20:45   DG Forearm Left Result Date: 11/05/2021 CLINICAL DATA:  Fall, left arm pain EXAM: LEFT FOREARM - 2 VIEW COMPARISON:  None Available. FINDINGS: Normal alignment. No acute fracture or dislocation. Chondrocalcinosis within the radiocarpal articulation is likely degenerative in nature. Soft tissues are unremarkable. IMPRESSION: No acute fracture or dislocation. Electronically Signed   By: Dorethia Molt M.D.   On: 11/05/2021 20:43   DG Pelvis 1-2 Views Result Date: 11/05/2021 CLINICAL DATA:  Fall, pelvic injury EXAM: PELVIS - 1-2 VIEW COMPARISON:  None Available. FINDINGS: Normal alignment. No acute fracture or dislocation. Moderate bilateral degenerative hip arthritis with joint space narrowing noted. Densely calcified fibroid noted within the pelvis. Soft tissues are otherwise unremarkable. IMPRESSION: No acute fracture or dislocation. Electronically Signed   By: Dorethia Molt M.D.   On: 11/05/2021 20:42   DG Humerus Left Result Date: 11/05/2021 CLINICAL DATA:  Fall, left arm pain EXAM: LEFT HUMERUS - 2+ VIEW COMPARISON:  None Available. FINDINGS: There is an acute  fracture involving the greater tuberosity of the left humeral head which appears posterolaterally displaced. There is a subtle lucency extending across the surgical neck of the humerus and a nondisplaced fracture of the surgical neck is not excluded. Humeral head appears seated within the glenoid fossa. The distal humerus is intact. IMPRESSION: Fractures of the greater tuberosity of the left humeral head and possibly the surgical neck of the left humerus. This could be better assessed with dedicated CT imaging. Electronically Signed   By: Dorethia Molt M.D.   On: 11/05/2021 20:41   CT Cervical Spine Wo Contrast Result Date: 11/05/2021 CLINICAL DATA:  Neck trauma (Age >= 65y).  Fall. EXAM: CT CERVICAL SPINE WITHOUT CONTRAST TECHNIQUE: Multidetector CT imaging of the cervical spine was performed without intravenous contrast. Multiplanar CT image reconstructions were also generated. RADIATION DOSE REDUCTION: This exam was performed according to the departmental dose-optimization program which includes automated exposure control, adjustment of the mA and/or kV according to patient size and/or use of iterative reconstruction technique. COMPARISON:  None Available. FINDINGS: Alignment: Grade 1 degenerative anterolisthesis of C6 on C7 and C7 on T1. Skull base and vertebrae: No acute fracture. No primary bone lesion or focal pathologic process. Soft tissues and spinal canal: Choose 1 Disc levels: Prior anterior fusion from C4-C6. Advanced degenerative disc disease at C6-7 and C2-3. Bilateral moderate degenerative facet disease. Upper chest: Biapical scarring. Other: None IMPRESSION: Degenerative disc and facet disease. Prior ACDF C4-C6. No acute bony abnormality. Electronically Signed   By: Franky Crease M.D.   On: 11/05/2021 20:27   CT Maxillofacial Wo Contrast Result Date: 11/05/2021 CLINICAL DATA:  Facial trauma, blunt.  Fall. EXAM: CT MAXILLOFACIAL WITHOUT CONTRAST TECHNIQUE: Multidetector CT imaging of the  maxillofacial structures was performed. Multiplanar CT image reconstructions were also generated. RADIATION DOSE REDUCTION: This exam was performed according to the departmental dose-optimization program which includes automated exposure control, adjustment of the mA and/or kV according to patient size and/or use of iterative reconstruction technique. COMPARISON:  None Available. FINDINGS: Osseous: Depressed fracture through the anterior wall of the left maxillary sinus. Fracture also noted through the posterior wall of the left maxillary sinus. Orbits: Fracture through the floor of the left orbit and lateral wall of the left orbit. Globes are intact. No orbital emphysema. Sinuses: Blood seen within the left maxillary sinus. Soft tissues: Soft tissue swelling over the left face and orbit. Limited intracranial: See head CT report IMPRESSION: Fractures through the left lateral orbital wall and floor of the left orbit. Fractures through the anterior and posterior walls of the left maxillary sinus. Left fills the left maxillary sinus. Electronically Signed   By: Franky Crease M.D.   On: 11/05/2021 20:25   CT HEAD WO CONTRAST ( ) Result Date: 11/05/2021 CLINICAL DATA:  Head trauma, intracranial venous injury suspected, fall EXAM: CT HEAD WITHOUT CONTRAST TECHNIQUE: Contiguous axial images were obtained from the base of the skull through the vertex without intravenous contrast. RADIATION  DOSE REDUCTION: This exam was performed according to the departmental dose-optimization program which includes automated exposure control, adjustment of the mA and/or kV according to patient size and/or use of iterative reconstruction technique. COMPARISON:  None Available. FINDINGS: Brain: There is atrophy and chronic small vessel disease changes. No acute intracranial abnormality. Specifically, no hemorrhage, hydrocephalus, mass lesion, acute infarction, or significant intracranial injury. Vascular: No hyperdense vessel or unexpected  calcification. Skull: No acute calvarial abnormality. Sinuses/Orbits: Fractures through the anterior wall of the left maxillary sinus and floor of the left orbit. Left lateral orbital wall fracture. Fracture through the posterior wall of the left maxillary sinus. Blood seen within the left maxillary sinus. Other: None IMPRESSION: No acute intracranial abnormality. Atrophy, chronic small vessel disease. Fractures through the anterior and posterior wall of the maxillary sinus. Fractures through the lateral wall and floor the left orbit. Electronically Signed   By: Franky Crease M.D.   On: 11/05/2021 20:23    Assessment and Plan Assessment & Plan    1. Primary hypertension (Primary) Blood pressure readings reviewed.  She has occasional few higher readings of 190s Patient is asymptomatic at this time Instructed nursing staff to have a second blood pressure reading if the earlier is higher. Along avoid salty foods Will increase amlodipine  to 5 mg daily Monitor blood pressure daily for 10 days Will instruct nursing staff to clonidine 0.1 mg as needed with the second blood pressure reading is above 190/80.   2. 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 Chronic diastolic congestive heart failure (HCC) Currently euvolemic on exam Continue with Lasix Avoid salty foods.

## 2024-04-04 ENCOUNTER — Non-Acute Institutional Stay: Payer: Self-pay | Admitting: Sports Medicine

## 2024-04-04 DIAGNOSIS — E0821 Diabetes mellitus due to underlying condition with diabetic nephropathy: Secondary | ICD-10-CM

## 2024-04-04 DIAGNOSIS — I5032 Chronic diastolic (congestive) heart failure: Secondary | ICD-10-CM | POA: Diagnosis not present

## 2024-04-04 DIAGNOSIS — D509 Iron deficiency anemia, unspecified: Secondary | ICD-10-CM

## 2024-04-04 DIAGNOSIS — I10 Essential (primary) hypertension: Secondary | ICD-10-CM | POA: Diagnosis not present

## 2024-04-04 DIAGNOSIS — F5101 Primary insomnia: Secondary | ICD-10-CM

## 2024-04-04 DIAGNOSIS — E782 Mixed hyperlipidemia: Secondary | ICD-10-CM | POA: Diagnosis not present

## 2024-04-04 NOTE — Progress Notes (Unsigned)
 Provider:  Dr. Jackalyn Blazing Location:  [facility] [place] Place of Service:   Skilled care   PCP: Mast, Man X, NP Patient Care Team: Mast, Man X, NP as PCP - General (Internal Medicine) [place] Mast, Man X, NP as Nurse Practitioner (Nurse Practitioner) Tommas Pears, MD as Consulting Physician (Endocrinology) Camillo Golas, MD as Consulting Physician (Ophthalmology) Rubie Kemps, MD as Consulting Physician (Orthopedic Surgery) Bonner Ade, MD as Consulting Physician (Physical Medicine and Rehabilitation) Unice Pac, MD as Consulting Physician (Neurosurgery) Kristie Lamprey, MD as Consulting Physician (Gastroenterology) [place], Peter M, MD as Consulting Physician (Cardiology) Cleotilde Ronal RAMAN, MD as Consulting Physician (Gynecology)  Extended Emergency Contact Information Primary Emergency Contact: Jama Mom [facility] [place] Mobile Phone: [phone] Relation: Niece Secondary Emergency Contact: Joan Arrant Mobile Phone: [phone] Relation: Other Preferred language: Isadora Interpreter needed? No Guardian: Jama Mom Mobile Phone: [phone] Relation: Legal Guardian Preferred language: English Interpreter needed? No  Goals of Care: Advanced Directive information    01/22/2024    2:22 PM  Advanced Directives  Does Patient Have a Medical Advance Directive? Yes  Type of Estate agent of [place];Living will;Out of facility DNR (pink MOST or yellow form)  Does patient want to make changes to medical advance directive? No - Patient declined  Copy of Healthcare Power of Attorney in Chart? Yes - validated most recent copy scanned in chart (See row information)      No chief complaint on file.      History of Present Illness  89 yr old F with h/o CAD, CKD, HLD, DM, HTN is evaluated for chronic disease management. Pt seen and examined in his room, seems pleasant and comfortable and does not appear to  be in distress. Pt states she is active and likes to walk  Goes to dining room to eat her meals Ambulates with a walker C/o intermittent joint pains Pt denies chest pain, palpitations, SOB, abdominal pain, nausea, vomiting, dysuria, hematuria, bloody or dark stools.     Latest Ref Rng & Units 01/21/2024   11:52 PM 11/19/2023   12:00 AM 10/30/2023   12:00 AM  CBC  WBC  7.8     8.9     7.5      Hemoglobin 12.0 - 16.0 10.0     9.9     8.6      Hematocrit 36 - 46 31     31     27       Platelets 150 - 400 K/uL 244     286     301         This result is from an external source.        Latest Ref Rng & Units 01/21/2024   11:52 PM 10/30/2023   12:00 AM 12/24/2022   12:00 AM  BMP  BUN 4 - 21 33     22     25      Creatinine 0.5 - 1.1  1.4     1.2      Sodium 137 - 147 137     139     139      Potassium 3.5 - 5.1 mEq/L 4.5     4.9     4.5      Chloride 99 - 108 103     104     104      CO2 13 - 22 29     28  26      Calcium  8.7 - 10.7  9.8     10.9         This result is from an external source.        Past Medical History:  Diagnosis Date   Contact dermatitis and other eczema, due to unspecified cause    Cortical senile cataract    GERD (gastroesophageal reflux disease) 02/17/2013   Hyperparathyroidism, unspecified 03/26/2010   Irritable bowel syndrome    Keratoconjunctivitis sicca, not specified as Sjogren's    Loss of weight    Macular degeneration (senile) of retina, unspecified    Memory loss    Other and unspecified hyperlipidemia    Other malaise and fatigue 06/17/2010   Pain in joint, site unspecified    Pathologic fracture of vertebrae    Reflux esophagitis    Scoliosis (and kyphoscoliosis), idiopathic    Senile osteoporosis    Type II or unspecified type diabetes mellitus without mention of complication, uncontrolled    Unspecified essential hypertension    Unspecified hereditary and idiopathic peripheral neuropathy    Unspecified vitamin D deficiency    Past  Surgical History:  Procedure Laterality Date   APPENDECTOMY     BREAST LUMPECTOMY  02/05/2010   Dr. Charlena Cornett   CATARACT EXTRACTION W/ INTRAOCULAR LENS  IMPLANT, BILATERAL  04/2013   Dr. Camillo   CHOLECYSTECTOMY  2004   Dr. Lily   COLONOSCOPY  2001   Dr. Kristie   fibroidectomy  1950   HEMORRHOID SURGERY  1978   KYPHOPLASTY  2010   T10 & T12 Dr. Fairy Levels   MEDIAL PARTIAL KNEE REPLACEMENT Left 2004   Dr. Rubie   SPINE SURGERY     vertebroplasty T10, T12   SPINE SURGERY  11/02/2001    C4-5 & C5-6 Titanium Plate Placed Dr. Gaither   THYROIDECTOMY, PARTIAL Left 2007   TOTAL KNEE ARTHROPLASTY Right 2009   Dr. Ernie     reports that she has never smoked. She has never used smokeless tobacco. She reports that she does not drink alcohol  and does not use drugs. Social History   Socioeconomic History   Marital status: Widowed    Spouse name: Not on file   Number of children: 0   Years of education: Not on file   Highest education level: Not on file  Occupational History   Occupation: retired Insurance underwriter: RETIRED  Tobacco Use   Smoking status: Never   Smokeless tobacco: Never  Vaping Use   Vaping status: Never Used  Substance and Sexual Activity   Alcohol  use: No   Drug use: No   Sexual activity: Never    Partners: Male    Birth control/protection: Post-menopausal  Other Topics Concern   Not on file  Social History Narrative   Lives at [facility] [place] since 02/06/2009   Widowed    No childred   Living Will   Social Drivers of Health   Financial Resource Strain: Low Risk  (11/24/2017)   Overall Financial Resource Strain (CARDIA)    Difficulty of Paying Living Expenses: Not hard at all  Food Insecurity: No Food Insecurity (11/24/2017)   Hunger Vital Sign    Worried About Running Out of Food in the Last Year: Never true    Ran Out of Food in the Last Year: Never true  Transportation Needs: No Transportation Needs (11/24/2017)   PRAPARE -  Transportation    Lack of Transportation (  Medical): No    Lack of Transportation (Non-Medical): No  Physical Activity: Insufficiently Active (11/24/2017)   Exercise Vital Sign    Days of Exercise per Week: 7 days    Minutes of Exercise per Session: 20 min  Stress: No Stress Concern Present (11/24/2017)   [employer] of Occupational Health - Occupational Stress Questionnaire    Feeling of Stress : Only a little  Social Connections: Moderately Isolated (11/24/2017)   Social Connection and Isolation Panel    Frequency of Communication with Friends and Family: More than three times a week    Frequency of Social Gatherings with Friends and Family: More than three times a week    Attends Religious Services: Never    Database administrator or Organizations: No    Attends Banker Meetings: Never    Marital Status: Widowed  Intimate Partner Violence: Not At Risk (11/24/2017)   Humiliation, Afraid, Rape, and Kick questionnaire    Fear of Current or Ex-Partner: No    Emotionally Abused: No    Physically Abused: No    Sexually Abused: No    Functional Status Survey:    Family History  Problem Relation Age of Onset   Diabetes Mother    Heart disease Mother        CHF   Stroke Father    Heart disease Sister        MI   Colon cancer Neg Hx    Stomach cancer Neg Hx    Esophageal cancer Neg Hx    Rectal cancer Neg Hx    Liver cancer Neg Hx     Health Maintenance  Topic Date Due   OPHTHALMOLOGY EXAM  08/29/2022   FOOT EXAM  10/23/2022   Influenza Vaccine  01/29/2024   COVID-19 Vaccine (8 - 2025-26 season) 02/29/2024   Medicare Annual Wellness (AWV)  06/21/2024   HEMOGLOBIN A1C  07/23/2024   DTaP/Tdap/Td (3 - Tdap) 12/09/2027   Pneumococcal Vaccine: 50+ Years  Completed   DEXA SCAN  Completed   Meningococcal B Vaccine  Aged Out   Zoster Vaccines- Shingrix  Discontinued    Allergies  Allergen Reactions   Bactrim     unknown   Betadine [Povidone Iodine]      unknown   Metformin  And Related     Hurt stomach   Nutmeg Oil (Myristica Oil)     unknown   Prednisone     Sulfa Antibiotics     unknown    Outpatient Encounter Medications as of 04/04/2024  Medication Sig   acetaminophen  (TYLENOL ) 325 MG tablet Take 650 mg by mouth every 6 (six) hours as needed for mild pain. Three times a day   acetaminophen  (TYLENOL ) 325 MG tablet Take 650 mg by mouth every other day.   amLODipine  (NORVASC ) 5 MG tablet Take 0.5 tablets (2.5 mg total) by mouth daily.   aspirin  EC 81 MG EC tablet Take 1 tablet (81 mg total) by mouth daily.   atorvastatin  (LIPITOR) 10 MG tablet Take 10 mg by mouth daily.   betamethasone  dipropionate (DIPROLENE ) 0.05 % ointment Apply 1 Application topically as needed.   diclofenac Sodium (VOLTAREN) 1 % GEL Apply 2 g topically daily. Apply to both knees   ferrous sulfate 325 (65 FE) MG tablet Take 325 mg by mouth 3 (three) times a week. Monday, Wednesday, and Friday.   furosemide (LASIX) 20 MG tablet Take 20 mg by mouth. Once A Day on Mon, Wed, Fri   losartan  (COZAAR )  25 MG tablet Take 25 mg by mouth every morning.   metoprolol  tartrate (LOPRESSOR ) 25 MG tablet Take 12.5 mg by mouth 2 (two) times daily. Hold Metoprolol  for SBP < 110 and DSP <50   mirtazapine (REMERON) 7.5 MG tablet Take 7.5 mg by mouth at bedtime. 1/2 tablet   nitroGLYCERIN  (NITROSTAT ) 0.4 MG SL tablet Place 0.4 mg under the tongue every 5 (five) minutes as needed for chest pain. PLACE 0.4 MG UNDER TONGUE EVERY 5 MINUTES X3 FOR CHEST PAIN, IF NO RELIEF, CALL MD OR SEND TO ED FOR FURTHER EVALUATION.   pantoprazole (PROTONIX) 40 MG tablet Take 40 mg by mouth daily.   polyethylene glycol (MIRALAX / GLYCOLAX) 17 g packet Take 17 g by mouth daily as needed.   potassium chloride (MICRO-K) 10 MEQ CR capsule Take 10 mEq by mouth 3 (three) times a week. Monday, Wednesday, and Friday.   sitaGLIPtin (JANUVIA) 25 MG tablet Take 25 mg by mouth daily.   No facility-administered  encounter medications on file as of 04/04/2024.    Review of Systems  Constitutional:  Negative for chills and fever.  Respiratory:  Negative for cough, shortness of breath and wheezing.   Cardiovascular:  Negative for chest pain, palpitations and leg swelling.  Gastrointestinal:  Negative for abdominal distention, abdominal pain, blood in stool, constipation, diarrhea, nausea and vomiting.  Genitourinary:  Negative for dysuria.  Neurological:  Negative for dizziness.   Negative unless indicated in HPI.  There were no vitals filed for this visit. There is no height or weight on file to calculate BMI. BP Readings from Last 3 Encounters:  03/28/24 (!) 150/70  01/27/24 (!) 135/58  01/22/24 130/60   Wt Readings from Last 3 Encounters:  03/28/24 102 lb (46.3 kg)  01/27/24 106 lb (48.1 kg)  01/22/24 106 lb (48.1 kg)   Physical Exam Constitutional:      Appearance: Normal appearance.  HENT:     Head: Normocephalic and atraumatic.  Cardiovascular:     Rate and Rhythm: Normal rate and regular rhythm.  Pulmonary:     Effort: Pulmonary effort is normal. No respiratory distress.     Breath sounds: Normal breath sounds. No wheezing.  Abdominal:     General: Bowel sounds are normal. There is no distension.     Tenderness: There is no abdominal tenderness. There is no guarding or rebound.     Comments:    Musculoskeletal:        General: No swelling.  Neurological:     Mental Status: She is alert. Mental status is at baseline.     Motor: No weakness.     Labs reviewed: Basic Metabolic Panel: Recent Labs    10/30/23 0000 01/21/24 2352  NA 139 137  K 4.9 4.5  CL 104 103  CO2 28* 29*  BUN 22* 33*  CREATININE 1.4*  --   CALCIUM  9.8  --    Liver Function Tests: Recent Labs    01/21/24 2352  AST 11*  ALT 9   No results for input(s): LIPASE, AMYLASE in the last 8760 hours. No results for input(s): AMMONIA in the last 8760 hours. CBC: Recent Labs     10/30/23 0000 11/19/23 0000 01/21/24 2352  WBC 7.5 8.9 7.8  NEUTROABS 3,705.00 4,495.00 3,674.00  HGB 8.6* 9.9* 10.0*  HCT 27* 31* 31*  PLT 301 286 244   Cardiac Enzymes: No results for input(s): CKTOTAL, CKMB, CKMBINDEX, TROPONINI in the last 8760 hours. BNP: Invalid input(s): POCBNP Lab  Results  Component Value Date   HGBA1C 11.1 01/21/2024   Lab Results  Component Value Date   TSH 0.38 (A) 01/21/2024   Lab Results  Component Value Date   VITAMINB12 601 12/24/2022   Lab Results  Component Value Date   FOLATE 44.1 11/13/2020   Lab Results  Component Value Date   IRON 70 11/13/2020   TIBC 307 11/13/2020   FERRITIN 22 11/13/2020    Imaging and Procedures obtained prior to SNF admission: DG Chest 1 View Result Date: 11/05/2021 CLINICAL DATA:  Fall, left arm pain EXAM: CHEST  1 VIEW COMPARISON:  None Available. FINDINGS: Lungs are clear. No pneumothorax or pleural effusion. Cardiac size within normal limits. Pulmonary vascularity is normal. Acute three-part fracture of the left humeral head is seen with fractures involving the greater tuberosity and surgical neck of the humerus. T10 and T12 vertebroplasty has been performed. IMPRESSION: Acute three-part fracture of the left humeral head. No radiographic evidence of acute cardiopulmonary disease. Electronically Signed   By: Dorethia Molt M.D.   On: 11/05/2021 20:45   DG Forearm Left Result Date: 11/05/2021 CLINICAL DATA:  Fall, left arm pain EXAM: LEFT FOREARM - 2 VIEW COMPARISON:  None Available. FINDINGS: Normal alignment. No acute fracture or dislocation. Chondrocalcinosis within the radiocarpal articulation is likely degenerative in nature. Soft tissues are unremarkable. IMPRESSION: No acute fracture or dislocation. Electronically Signed   By: Dorethia Molt M.D.   On: 11/05/2021 20:43   DG Pelvis 1-2 Views Result Date: 11/05/2021 CLINICAL DATA:  Fall, pelvic injury EXAM: PELVIS - 1-2 VIEW COMPARISON:  None  Available. FINDINGS: Normal alignment. No acute fracture or dislocation. Moderate bilateral degenerative hip arthritis with joint space narrowing noted. Densely calcified fibroid noted within the pelvis. Soft tissues are otherwise unremarkable. IMPRESSION: No acute fracture or dislocation. Electronically Signed   By: Dorethia Molt M.D.   On: 11/05/2021 20:42   DG Humerus Left Result Date: 11/05/2021 CLINICAL DATA:  Fall, left arm pain EXAM: LEFT HUMERUS - 2+ VIEW COMPARISON:  None Available. FINDINGS: There is an acute fracture involving the greater tuberosity of the left humeral head which appears posterolaterally displaced. There is a subtle lucency extending across the surgical neck of the humerus and a nondisplaced fracture of the surgical neck is not excluded. Humeral head appears seated within the glenoid fossa. The distal humerus is intact. IMPRESSION: Fractures of the greater tuberosity of the left humeral head and possibly the surgical neck of the left humerus. This could be better assessed with dedicated CT imaging. Electronically Signed   By: Dorethia Molt M.D.   On: 11/05/2021 20:41   CT Cervical Spine Wo Contrast Result Date: 11/05/2021 CLINICAL DATA:  Neck trauma (Age >= 65y).  Fall. EXAM: CT CERVICAL SPINE WITHOUT CONTRAST TECHNIQUE: Multidetector CT imaging of the cervical spine was performed without intravenous contrast. Multiplanar CT image reconstructions were also generated. RADIATION DOSE REDUCTION: This exam was performed according to the departmental dose-optimization program which includes automated exposure control, adjustment of the mA and/or kV according to patient size and/or use of iterative reconstruction technique. COMPARISON:  None Available. FINDINGS: Alignment: Grade 1 degenerative anterolisthesis of C6 on C7 and C7 on T1. Skull base and vertebrae: No acute fracture. No primary bone lesion or focal pathologic process. Soft tissues and spinal canal: Choose 1 Disc levels: Prior  anterior fusion from C4-C6. Advanced degenerative disc disease at C6-7 and C2-3. Bilateral moderate degenerative facet disease. Upper chest: Biapical scarring. Other: None IMPRESSION: Degenerative disc  and facet disease. Prior ACDF C4-C6. No acute bony abnormality. Electronically Signed   By: Franky Crease M.D.   On: 11/05/2021 20:27   CT Maxillofacial Wo Contrast Result Date: 11/05/2021 CLINICAL DATA:  Facial trauma, blunt.  Fall. EXAM: CT MAXILLOFACIAL WITHOUT CONTRAST TECHNIQUE: Multidetector CT imaging of the maxillofacial structures was performed. Multiplanar CT image reconstructions were also generated. RADIATION DOSE REDUCTION: This exam was performed according to the departmental dose-optimization program which includes automated exposure control, adjustment of the mA and/or kV according to patient size and/or use of iterative reconstruction technique. COMPARISON:  None Available. FINDINGS: Osseous: Depressed fracture through the anterior wall of the left maxillary sinus. Fracture also noted through the posterior wall of the left maxillary sinus. Orbits: Fracture through the floor of the left orbit and lateral wall of the left orbit. Globes are intact. No orbital emphysema. Sinuses: Blood seen within the left maxillary sinus. Soft tissues: Soft tissue swelling over the left face and orbit. Limited intracranial: See head CT report IMPRESSION: Fractures through the left lateral orbital wall and floor of the left orbit. Fractures through the anterior and posterior walls of the left maxillary sinus. Left fills the left maxillary sinus. Electronically Signed   By: Franky Crease M.D.   On: 11/05/2021 20:25   CT HEAD WO CONTRAST ( ) Result Date: 11/05/2021 CLINICAL DATA:  Head trauma, intracranial venous injury suspected, fall EXAM: CT HEAD WITHOUT CONTRAST TECHNIQUE: Contiguous axial images were obtained from the base of the skull through the vertex without intravenous contrast. RADIATION DOSE REDUCTION: This  exam was performed according to the departmental dose-optimization program which includes automated exposure control, adjustment of the mA and/or kV according to patient size and/or use of iterative reconstruction technique. COMPARISON:  None Available. FINDINGS: Brain: There is atrophy and chronic small vessel disease changes. No acute intracranial abnormality. Specifically, no hemorrhage, hydrocephalus, mass lesion, acute infarction, or significant intracranial injury. Vascular: No hyperdense vessel or unexpected calcification. Skull: No acute calvarial abnormality. Sinuses/Orbits: Fractures through the anterior wall of the left maxillary sinus and floor of the left orbit. Left lateral orbital wall fracture. Fracture through the posterior wall of the left maxillary sinus. Blood seen within the left maxillary sinus. Other: None IMPRESSION: No acute intracranial abnormality. Atrophy, chronic small vessel disease. Fractures through the anterior and posterior wall of the maxillary sinus. Fractures through the lateral wall and floor the left orbit. Electronically Signed   By: Franky Crease M.D.   On: 11/05/2021 20:23    Assessment and Plan Assessment & Plan  HTN  Bp 130/62 Denies dizzy or lightheadedness Cont with amlodipine , losartan , metoprolol   Cont with regular exercises   Dm  Lab Results  Component Value Date   HGBA1C 11.1 01/21/2024   HGBA1C 8.2 10/30/2023   HGBA1C 6.9 12/24/2022   Will check A1c Cont with januvia   IDA Will check cbc Cont with iron pills  CHF  Euvolemic on exam  Cont with lasix Avoid salty foods   HLD Cont with lipitor   HLD Cont with lipitor  Insomnia  Cont with rameron   Cognitive impairment  No behavioral problems Increase cognitively engaging activities and physical activity      Will check labs cbc, bmp, TSH, A1C

## 2024-04-07 ENCOUNTER — Encounter: Payer: Self-pay | Admitting: Sports Medicine

## 2024-05-12 ENCOUNTER — Ambulatory Visit: Admitting: Internal Medicine

## 2024-05-23 ENCOUNTER — Encounter: Payer: Self-pay | Admitting: Internal Medicine

## 2024-05-23 ENCOUNTER — Ambulatory Visit: Attending: Internal Medicine | Admitting: Internal Medicine

## 2024-05-23 VITALS — BP 148/62 | HR 68 | Ht 64.0 in | Wt 108.6 lb

## 2024-05-23 DIAGNOSIS — I5032 Chronic diastolic (congestive) heart failure: Secondary | ICD-10-CM | POA: Diagnosis not present

## 2024-05-23 DIAGNOSIS — I700 Atherosclerosis of aorta: Secondary | ICD-10-CM | POA: Insufficient documentation

## 2024-05-23 DIAGNOSIS — I342 Nonrheumatic mitral (valve) stenosis: Secondary | ICD-10-CM | POA: Insufficient documentation

## 2024-05-23 DIAGNOSIS — E785 Hyperlipidemia, unspecified: Secondary | ICD-10-CM | POA: Diagnosis not present

## 2024-05-23 NOTE — Patient Instructions (Signed)
 Medication Instructions:  No medication changes were made at this visit. Continue current regimen.  *If you need a refill on your cardiac medications before your next appointment, please call your pharmacy*  Follow-Up: At [facility], you and your health needs are our priority.  As part of our continuing mission to provide you with exceptional heart care, our providers are all part of one team.  This team includes your primary Cardiologist (physician) and Advanced Practice Providers or APPs (Physician Assistants and Nurse Practitioners) who all work together to provide you with the care you need, when you need it.  Your next appointment:   1 year(s)  Provider:   Emeline FORBES Calender, MD    We recommend signing up for the patient portal called MyChart.  Sign up information is provided on this After Visit Summary.  MyChart is used to connect with patients for Virtual Visits (Telemedicine).  Patients are able to view lab/test results, encounter notes, upcoming appointments, etc.  Non-urgent messages can be sent to your provider as well.   To learn more about what you can do with MyChart, go to [email or web address].

## 2024-05-23 NOTE — Progress Notes (Signed)
 Cardiology Office Note   Date:  05/23/2024  ID:  TIKIA SKILTON, DOB 08/05/1925, MRN [id number] PCP: Mast, Man X, NP  [facility] Providers Cardiologist:  Emeline FORBES Calender, MD     History of Present Illness [name] is a 89 y.o. female with a past medical history of GERD, HFpEF, hypertension, mild to moderate MS, mild MR, aortic atheroma, hyperlipidemia, type 2 diabetes, CKD 3 who resides at [facility] and presents with an aid who presents today to establish care.  She denies any complaints of chest pain, shortness of breath, lower extremity swelling or any other issues.  She is able to go for walks on a daily basis without symptoms.  Tolerating her medications.  Previously seen in the ED 06/27/2018 with chest pain and minimally elevated troponin initially concerning for ACS however her workup was unremarkable including unremarkable EKG, CXR and echocardiogram and therefore she pursued medical management treatment only and not invasive evaluation.  ROS:  Review of Systems  All other systems reviewed and are negative.   Physical Exam  Physical Exam Vitals and nursing note reviewed.  Constitutional:      Appearance: Normal appearance.  HENT:     Head: Normocephalic and atraumatic.  Eyes:     Conjunctiva/sclera: Conjunctivae normal.  Neck:     Vascular: No carotid bruit.  Cardiovascular:     Rate and Rhythm: Normal rate and regular rhythm.  Pulmonary:     Effort: Pulmonary effort is normal.     Breath sounds: Normal breath sounds.  Musculoskeletal:        General: No swelling or tenderness.  Skin:    Coloration: Skin is not jaundiced or pale.  Neurological:     Mental Status: She is alert.     VS:  BP (!) 148/62   Pulse 68   Ht 5' 4 (1.626 m)   Wt 108 lb 9.6 oz (49.3 kg)   SpO2 90%   BMI 18.64 kg/m         Wt Readings from Last 3 Encounters:  05/23/24 108 lb 9.6 oz (49.3 kg)  04/04/24 106 lb 9.6 oz (48.4 kg)  03/28/24 102 lb (46.3 kg)      EKG Interpretation Date/Time:  Monday May 23 2024 11:13:22 EST Ventricular Rate:  68 PR Interval:  182 QRS Duration:  72 QT Interval:  382 QTC Calculation: 406 R Axis:   -25  Text Interpretation: Normal sinus rhythm with sinus arrhythmia Cannot rule out Anterior infarct , age undetermined When compared with ECG of 05-Nov-2021 19:47, No significant change since Confirmed by Calender Emeline [phone]) on 05/23/2024 11:27:05 AM    Studies Reviewed   Echocardiogram 11/13/2020  1. Left ventricular ejection fraction, by estimation, is 65 to 70%. The  left ventricle has normal function. The left ventricle has no regional  wall motion abnormalities. Left ventricular diastolic parameters are  consistent with Grade I diastolic  dysfunction (impaired relaxation).   2. Right ventricular systolic function is normal. The right ventricular  size is normal. There is normal pulmonary artery systolic pressure.   3. There is no evidence of pericardial effusion.   4. The mitral valve is degenerative. Mild mitral valve regurgitation.  Mild to moderate mitral stenosis. Moderate mitral annular calcification.   5. Tricuspid valve regurgitation is moderate.   6. The aortic valve is tricuspid. There is mild calcification of the  aortic valve. There is mild thickening of the aortic valve. Aortic valve  regurgitation is not  visualized. Mild aortic valve sclerosis is present,  with no evidence of aortic valve  stenosis.   7. There is mild (Grade II) atheroma plaque involving the ascending  aorta.   8. The inferior vena cava is normal in size with <50% respiratory  variability, suggesting right atrial pressure of 8 mmHg.       Risk Assessment/Calculations          ASCVD risk score: The ASCVD Risk score (Arnett DK, et al., 2019) failed to calculate for the following reasons:   The 2019 ASCVD risk score is only valid for ages 29 to 76   Risk score cannot be calculated because patient has a  medical history suggesting prior/existing ASCVD   ASSESSMENT  Hypertension BP 148/62.  On losartan  25 mg, Toprol  tartrate 12.5 mg twice daily, amlodipine  5 mg, clonidine 0.1 mg every 24 as needed Chronic diastolic heart failure Lasix 20 mg M, W, F Mild to moderate mitral stenosis noted on her prior echo in 2022.  Stable without any symptoms. Grade 2 ascending aorta atheroma noted on echocardiogram.  On aspirin  and statin Type 2 diabetes History of ACS triaged to medical management   Plan  As patient is asymptomatic, is able to walk daily without issues and 89 years old we do not need to pursue aggressive workup.  Continue current medications and management.  Follow up: 1 year          Signed, Emeline FORBES Calender, MD

## 2024-07-12 ENCOUNTER — Non-Acute Institutional Stay: Payer: Self-pay | Admitting: Nurse Practitioner

## 2024-07-12 ENCOUNTER — Encounter: Payer: Self-pay | Admitting: Nurse Practitioner

## 2024-07-12 DIAGNOSIS — E1122 Type 2 diabetes mellitus with diabetic chronic kidney disease: Secondary | ICD-10-CM | POA: Diagnosis not present

## 2024-07-12 DIAGNOSIS — F418 Other specified anxiety disorders: Secondary | ICD-10-CM

## 2024-07-12 DIAGNOSIS — I5032 Chronic diastolic (congestive) heart failure: Secondary | ICD-10-CM

## 2024-07-12 DIAGNOSIS — Z7984 Long term (current) use of oral hypoglycemic drugs: Secondary | ICD-10-CM | POA: Diagnosis not present

## 2024-07-12 DIAGNOSIS — E785 Hyperlipidemia, unspecified: Secondary | ICD-10-CM

## 2024-07-12 DIAGNOSIS — I10 Essential (primary) hypertension: Secondary | ICD-10-CM | POA: Diagnosis not present

## 2024-07-12 DIAGNOSIS — K219 Gastro-esophageal reflux disease without esophagitis: Secondary | ICD-10-CM

## 2024-07-12 DIAGNOSIS — N183 Chronic kidney disease, stage 3 unspecified: Secondary | ICD-10-CM | POA: Diagnosis not present

## 2024-07-12 DIAGNOSIS — D649 Anemia, unspecified: Secondary | ICD-10-CM | POA: Diagnosis not present

## 2024-07-12 DIAGNOSIS — R4189 Other symptoms and signs involving cognitive functions and awareness: Secondary | ICD-10-CM

## 2024-07-12 NOTE — Progress Notes (Unsigned)
 " Location:  [facility] Room Number: [id number]-J Place of Service:  ALF [phone]) Provider: Etheleen Valtierra X, NP  Patient Care Team: Shanedra Lave X, NP as PCP - General (Internal Medicine) Kriste Emeline BRAVO, DO as PCP - Cardiology (Cardiology) [place] Jillian Pianka X, NP as Nurse Practitioner (Nurse Practitioner) Tommas Pears, MD as Consulting Physician (Endocrinology) Camillo Golas, MD as Consulting Physician (Ophthalmology) Rubie Kemps, MD as Consulting Physician (Orthopedic Surgery) Bonner Ade, MD as Consulting Physician (Physical Medicine and Rehabilitation) Unice Pac, MD as Consulting Physician (Neurosurgery) Kristie Lamprey, MD as Consulting Physician (Gastroenterology) Jordan, Peter M, MD as Consulting Physician (Cardiology) Cleotilde Ronal RAMAN, MD as Consulting Physician (Gynecology)  Extended Emergency Contact Information Primary Emergency Contact: [facility] [place] Mobile Phone: [phone] Relation: Niece Secondary Emergency Contact: [facility] Phone: [phone] Relation: Other Preferred language: Isadora Interpreter needed? No GuardianBETHA Jama Mom Mobile Phone: [phone] Relation: Legal Guardian Preferred language: English Interpreter needed? No  Code Status:  DNR Goals of care: Advanced Directive information    01/22/2024    2:22 PM  Advanced Directives  Does Patient Have a Medical Advance Directive? Yes  Type of Estate Agent of [place];Living will;Out of facility DNR (pink MOST or yellow form)  Does patient want to make changes to medical advance directive? No - Patient declined  Copy of Healthcare Power of Attorney in Chart? Yes - validated most recent copy scanned in chart (See row information)     Chief Complaint  Patient presents with   medication management of chronic issues    Routine visit    HPI:  Pt is a 89 y.o. female seen today for medical management of chronic  diseases.     Hypercalcemia, Ca 11.7>>9.9(corrected to 10.7, PTH 35, s/p Zometa  x1, PTHrP <2.0 11/11/20,  Hyperparathyroidism, workup with Dr. Tommas, Ca 9.8 10/30/23, off MVI with minerals 01/06/23             T2DM, resumed Metformin , Hgb a1c 11.1 01/21/24>>8.1 10/30/23             HTN, occasionally mild Sbp in 140s, takes Losartan , Metoprolol , Amlodipine .              CKD stage 3 Bun/creat 22/1.4 10/30/23             CHF, chronic edema BLE, 04/16/21 Echo EF 40-45%, diastolic dysfunction. on Furosemide             Cognitive impairment, at her baseline. Vit B 601 12/24/22, declined further eye exam and DEXA             Weight loss, stable, on Mirtazapine 7.5mg  every day, TSH 0.65 01/30/22>>0.38 01/21/24             Hyperlipidemia, takes Atorvastatin , LDL 44 01/21/24             Anemia, Hgb 10 01/21/24 @ baseline , on Iron. Iron 33, Vit B12 491 11/12/23             GERD, taking Pantoprazole.                Past Medical History:  Diagnosis Date   Contact dermatitis and other eczema, due to unspecified cause    Cortical senile cataract    GERD (gastroesophageal reflux disease) 02/17/2013   Hyperparathyroidism, unspecified 03/26/2010   Irritable bowel syndrome    Keratoconjunctivitis sicca, not specified as Sjogren's    Loss of weight    Macular degeneration (senile) of  retina, unspecified    Memory loss    Other and unspecified hyperlipidemia    Other malaise and fatigue 06/17/2010   Pain in joint, site unspecified    Pathologic fracture of vertebrae    Reflux esophagitis    Scoliosis (and kyphoscoliosis), idiopathic    Senile osteoporosis    Type II or unspecified type diabetes mellitus without mention of complication, uncontrolled    Unspecified essential hypertension    Unspecified hereditary and idiopathic peripheral neuropathy    Unspecified vitamin D deficiency    Past Surgical History:  Procedure Laterality Date   APPENDECTOMY     BREAST LUMPECTOMY  02/05/2010   Dr. Charlena Cornett   CATARACT  EXTRACTION W/ INTRAOCULAR LENS  IMPLANT, BILATERAL  04/2013   Dr. Camillo   CHOLECYSTECTOMY  2004   Dr. Lily   COLONOSCOPY  2001   Dr. Kristie   fibroidectomy  1950   HEMORRHOID SURGERY  1978   KYPHOPLASTY  2010   T10 & T12 Dr. Fairy Levels   MEDIAL PARTIAL KNEE REPLACEMENT Left 2004   Dr. Rubie   SPINE SURGERY     vertebroplasty T10, T12   SPINE SURGERY  11/02/2001    C4-5 & C5-6 Titanium Plate Placed Dr. Gaither   THYROIDECTOMY, PARTIAL Left 2007   TOTAL KNEE ARTHROPLASTY Right 2009   Dr. Ernie     Allergies[1]  Outpatient Encounter Medications as of 07/12/2024  Medication Sig   acetaminophen  (TYLENOL ) 325 MG tablet Take 650 mg by mouth every 6 (six) hours as needed for mild pain. Three times a day   amLODipine  (NORVASC ) 5 MG tablet Take 0.5 tablets (2.5 mg total) by mouth daily.   aspirin  EC 81 MG EC tablet Take 1 tablet (81 mg total) by mouth daily.   atorvastatin  (LIPITOR) 10 MG tablet Take 10 mg by mouth daily.   betamethasone  dipropionate (DIPROLENE ) 0.05 % ointment Apply 1 Application topically as needed.   cloNIDine (CATAPRES) 0.1 MG tablet Take 0.1 mg by mouth daily.   diclofenac Sodium (VOLTAREN) 1 % GEL Apply 2 g topically daily. Apply to both knees   ferrous sulfate 325 (65 FE) MG tablet Take 325 mg by mouth 3 (three) times a week. Monday, Wednesday, and Friday.   furosemide (LASIX) 20 MG tablet Take 20 mg by mouth. Once A Day on Mon, Wed, Fri   losartan  (COZAAR ) 25 MG tablet Take 25 mg by mouth every morning.   metoprolol  tartrate (LOPRESSOR ) 25 MG tablet Take 12.5 mg by mouth 2 (two) times daily. Hold Metoprolol  for SBP < 110 and DSP <50   mirtazapine (REMERON) 7.5 MG tablet Take 7.5 mg by mouth at bedtime. 1/2 tablet   nitroGLYCERIN  (NITROSTAT ) 0.4 MG SL tablet Place 0.4 mg under the tongue every 5 (five) minutes as needed for chest pain. PLACE 0.4 MG UNDER TONGUE EVERY 5 MINUTES X3 FOR CHEST PAIN, IF NO RELIEF, CALL MD OR SEND TO ED FOR FURTHER EVALUATION.    pantoprazole (PROTONIX) 40 MG tablet Take 40 mg by mouth daily.   polyethylene glycol (MIRALAX / GLYCOLAX) 17 g packet Take 17 g by mouth daily as needed.   potassium chloride (MICRO-K) 10 MEQ CR capsule Take 10 mEq by mouth 3 (three) times a week. Monday, Wednesday, and Friday.   sitaGLIPtin (JANUVIA) 25 MG tablet Take 25 mg by mouth daily.   No facility-administered encounter medications on file as of 07/12/2024.    Review of Systems  Constitutional:  Negative for fatigue, fever and  unexpected weight change.  HENT:  Positive for hearing loss. Negative for congestion and trouble swallowing.   Eyes:  Negative for visual disturbance.  Respiratory:  Negative for cough and shortness of breath.   Cardiovascular:  Positive for leg swelling. Negative for chest pain and palpitations.  Gastrointestinal: Negative.  Negative for abdominal pain, constipation and diarrhea.       Urge incontinent of bowel.    Genitourinary:  Negative for dysuria and urgency.  Musculoskeletal:  Positive for arthralgias and gait problem.       Left knee pain when walking too much.   Skin:  Negative for color change.  Neurological:  Negative for speech difficulty, weakness and light-headedness.  Psychiatric/Behavioral:  Negative for confusion and sleep disturbance. The patient is not nervous/anxious.     Immunization History  Administered Date(s) Administered   Covid-19 Iv Non-us  Vaccine (Bibp, Sinopharm) 04/25/2024   Fluad Quad(high Dose 65+) 04/01/2023, 04/05/2024   INFLUENZA, HIGH DOSE SEASONAL PF 04/02/2019   Influenza Whole 03/30/2012, 04/13/2013, 04/04/2018   Influenza-Unspecified 04/17/2014, 03/29/2015, 04/18/2021, 04/21/2022   Moderna SARS-COV2 Booster Vaccination 05/08/2020, 04/15/2023   Moderna Sars-Covid-2 Vaccination 07/02/2019, 07/30/2019, 11/27/2020   PFIZER Comirnaty(Gray Top)Covid-19 Tri-Sucrose Vaccine 03/19/2021   Pfizer Covid-19 Vaccine Bivalent Booster 31yrs & up 05/01/2022   Pneumococcal  Conjugate-13 12/08/2017   Pneumococcal Polysaccharide-23 06/30/2001   Td 06/30/2001, 12/08/2017   Unspecified SARS-COV-2 Vaccination 05/01/2022   Pertinent  Health Maintenance Due  Topic Date Due   OPHTHALMOLOGY EXAM  08/29/2022   FOOT EXAM  10/23/2022   HEMOGLOBIN A1C  07/23/2024   Influenza Vaccine  Completed   Bone Density Scan  Completed      11/12/2020    7:45 AM 11/13/2020    2:00 AM 11/13/2020   10:00 AM 11/05/2021    7:37 PM 06/17/2022    9:10 AM  Fall Risk  Falls in the past year?     0  Was there an injury with Fall?     0   Fall Risk Category Calculator     0  Fall Risk Category (Retired)     Low   (RETIRED) Patient Fall Risk Level Moderate fall risk  Moderate fall risk  Moderate fall risk  High fall risk  Low fall risk   Patient at Risk for Falls Due to     No Fall Risks  Fall risk Follow up     Falls evaluation completed      Data saved with a previous flowsheet row definition   Functional Status Survey:    Vitals:   07/12/24 1027 07/12/24 1038  BP: (!) 140/70 (!) 151/81  Pulse: 98   SpO2: 98%   Weight: 107 lb 3.2 oz (48.6 kg)   Height: 5' 4 (1.626 m)    Body mass index is 18.4 kg/m. Physical Exam Vitals and nursing note reviewed.  Constitutional:      Appearance: Normal appearance.  HENT:     Head: Normocephalic and atraumatic.     Mouth/Throat:     Mouth: Mucous membranes are moist.  Eyes:     Extraocular Movements: Extraocular movements intact.     Conjunctiva/sclera: Conjunctivae normal.     Pupils: Pupils are equal, round, and reactive to light.  Cardiovascular:     Rate and Rhythm: Normal rate and regular rhythm.     Heart sounds: No murmur heard.    Comments: DP pulses present R+L Pulmonary:     Effort: Pulmonary effort is normal.     Breath  sounds: No rales.  Abdominal:     General: Bowel sounds are normal.     Palpations: Abdomen is soft.     Tenderness: There is no abdominal tenderness.  Genitourinary:    Comments: External  hemorrhoids, no injury or bleeding.  Musculoskeletal:     Cervical back: Normal range of motion and neck supple.     Right lower leg: Edema present.     Left lower leg: Edema present.     Comments: Kyphoscoliosis. Minimal edema BLE  Skin:    General: Skin is warm and dry.     Comments: Yellow, thick, long toenails.   Neurological:     General: No focal deficit present.     Mental Status: She is alert. Mental status is at baseline.     Gait: Gait abnormal.     Comments: Oriented to person, place.   Psychiatric:        Mood and Affect: Mood normal.        Behavior: Behavior normal.        Thought Content: Thought content normal.        Judgment: Judgment normal.     Labs reviewed: Recent Labs    10/30/23 0000 01/21/24 2352  NA 139 137  K 4.9 4.5  CL 104 103  CO2 28* 29*  BUN 22* 33*  CREATININE 1.4*  --   CALCIUM  9.8  --    Recent Labs    01/21/24 2352  AST 11*  ALT 9   Recent Labs    10/30/23 0000 11/19/23 0000 01/21/24 2352  WBC 7.5 8.9 7.8  NEUTROABS 3,705.00 4,495.00 3,674.00  HGB 8.6* 9.9* 10.0*  HCT 27* 31* 31*  PLT 301 286 244   Lab Results  Component Value Date   TSH 0.38 (A) 01/21/2024   Lab Results  Component Value Date   HGBA1C 11.1 01/21/2024   Lab Results  Component Value Date   CHOL 98 01/21/2024   HDL 59 12/24/2022   LDLCALC 44 01/21/2024   TRIG 55 01/21/2024   CHOLHDL 2.4 06/27/2018    Significant Diagnostic Results in last 30 days:  No results found.  Assessment/Plan GERD (gastroesophageal reflux disease) Stable, continue pantoprazole  Anemia  Hgb 10 01/21/24 @ baseline , on Iron. Iron 33, Vit B12 491 11/12/23 Update CBC/differential  Hyperlipidemia akes Atorvastatin , LDL 44 01/21/24 Update lipid panel  Depression with anxiety  stable, on Mirtazapine 7.5mg  every day, TSH 0.65 01/30/22>>0.38 01/21/24 Repeat TSH, obtain free T3/T4  Cognitive impairment  at her baseline. Vit B 601 12/24/22, declined further eye exam and  DEXA  Diastolic CHF (HCC) chronic edema BLE, 04/16/21 Echo EF 40-45%, diastolic dysfunction. on Furosemide  CKD stage 3 due to type 2 diabetes mellitus (HCC) Bun/creat 22/1.4 10/30/23,  resumed Metformin , Hgb a1c 11.1 01/21/24>>8.1 10/30/23 Update CMP/eGFR, Hgb A1c  Essential hypertension occasionally mild Sbp in 140s, takes Losartan , Metoprolol , Amlodipine .      Family/ staff Communication: Plan of care reviewed with the patient and charge nurse  Labs/tests ordered: HgbA1c, TSH, free T3/T4, CBC/differential, CMP/eGFR, lipids        [1]  Allergies Allergen Reactions   Bactrim     unknown   Betadine [Povidone Iodine]     unknown   Metformin  And Related     Hurt stomach   Nutmeg Oil (Myristica Oil)     unknown   Prednisone     Sulfa Antibiotics     unknown   "

## 2024-07-12 NOTE — Assessment & Plan Note (Signed)
 occasionally mild Sbp in 140s, takes Losartan , Metoprolol , Amlodipine .

## 2024-07-12 NOTE — Assessment & Plan Note (Addendum)
 Bun/creat 22/1.4 10/30/23,  resumed Metformin , Hgb a1c 11.1 01/21/24>>8.1 10/30/23 Update CMP/eGFR, Hgb A1c

## 2024-07-12 NOTE — Assessment & Plan Note (Signed)
 at her baseline. Vit B 601 12/24/22, declined further eye exam and DEXA

## 2024-07-12 NOTE — Assessment & Plan Note (Signed)
chronic edema BLE, 04/16/21 Echo EF 40-45%, diastolic dysfunction. on Furosemide 

## 2024-07-12 NOTE — Assessment & Plan Note (Signed)
"   stable, on Mirtazapine 7.5mg  every day, TSH 0.65 01/30/22>>0.38 01/21/24 Repeat TSH, obtain free T3/T4 "

## 2024-07-12 NOTE — Assessment & Plan Note (Signed)
 Stable, continue pantoprazole.

## 2024-07-12 NOTE — Assessment & Plan Note (Signed)
"   Hgb 10 01/21/24 @ baseline , on Iron. Iron 33, Vit B12 491 11/12/23 Update CBC/differential "

## 2024-07-12 NOTE — Assessment & Plan Note (Signed)
 akes Atorvastatin , LDL 44 01/21/24 Update lipid panel

## 2024-07-14 ENCOUNTER — Encounter: Payer: Self-pay | Admitting: Nurse Practitioner
